# Patient Record
Sex: Female | Born: 1970 | Race: Black or African American | Hispanic: No | Marital: Single | Smoking: Former smoker
Health system: Southern US, Community
[De-identification: ages and names within clinical notes are randomized; demographics above are authoritative.]

## PROBLEM LIST (undated history)

## (undated) ENCOUNTER — Emergency Department (HOSPITAL_COMMUNITY): Admission: EM | Payer: Medicaid Other | Source: Home / Self Care

## (undated) DIAGNOSIS — I10 Essential (primary) hypertension: Secondary | ICD-10-CM

## (undated) DIAGNOSIS — R7989 Other specified abnormal findings of blood chemistry: Secondary | ICD-10-CM

## (undated) DIAGNOSIS — F200 Paranoid schizophrenia: Secondary | ICD-10-CM

## (undated) DIAGNOSIS — F329 Major depressive disorder, single episode, unspecified: Secondary | ICD-10-CM

## (undated) DIAGNOSIS — Z720 Tobacco use: Secondary | ICD-10-CM

## (undated) DIAGNOSIS — F32A Depression, unspecified: Secondary | ICD-10-CM

## (undated) DIAGNOSIS — R778 Other specified abnormalities of plasma proteins: Secondary | ICD-10-CM

## (undated) DIAGNOSIS — N3010 Interstitial cystitis (chronic) without hematuria: Secondary | ICD-10-CM

## (undated) DIAGNOSIS — D509 Iron deficiency anemia, unspecified: Secondary | ICD-10-CM

## (undated) DIAGNOSIS — F410 Panic disorder [episodic paroxysmal anxiety] without agoraphobia: Secondary | ICD-10-CM

## (undated) DIAGNOSIS — F419 Anxiety disorder, unspecified: Secondary | ICD-10-CM

## (undated) DIAGNOSIS — F319 Bipolar disorder, unspecified: Secondary | ICD-10-CM

## (undated) HISTORY — DX: Essential (primary) hypertension: I10

## (undated) HISTORY — PX: TUBAL LIGATION: SHX77

## (undated) HISTORY — DX: Bipolar disorder, unspecified: F31.9

---

## 1997-12-31 ENCOUNTER — Inpatient Hospital Stay (HOSPITAL_COMMUNITY): Admission: AD | Admit: 1997-12-31 | Discharge: 1997-12-31 | Payer: Self-pay | Admitting: *Deleted

## 1998-01-17 ENCOUNTER — Inpatient Hospital Stay (HOSPITAL_COMMUNITY): Admission: AD | Admit: 1998-01-17 | Discharge: 1998-01-17 | Payer: Self-pay | Admitting: *Deleted

## 1998-06-17 ENCOUNTER — Inpatient Hospital Stay (HOSPITAL_COMMUNITY): Admission: AD | Admit: 1998-06-17 | Discharge: 1998-06-17 | Payer: Self-pay | Admitting: Obstetrics & Gynecology

## 1998-07-31 ENCOUNTER — Inpatient Hospital Stay (HOSPITAL_COMMUNITY): Admission: AD | Admit: 1998-07-31 | Discharge: 1998-07-31 | Payer: Self-pay | Admitting: *Deleted

## 1998-10-30 ENCOUNTER — Emergency Department (HOSPITAL_COMMUNITY): Admission: EM | Admit: 1998-10-30 | Discharge: 1998-10-30 | Payer: Self-pay | Admitting: Emergency Medicine

## 1998-11-04 ENCOUNTER — Emergency Department (HOSPITAL_COMMUNITY): Admission: EM | Admit: 1998-11-04 | Discharge: 1998-11-04 | Payer: Self-pay | Admitting: Emergency Medicine

## 1998-12-19 ENCOUNTER — Emergency Department (HOSPITAL_COMMUNITY): Admission: EM | Admit: 1998-12-19 | Discharge: 1998-12-19 | Payer: Self-pay | Admitting: Emergency Medicine

## 1999-01-30 ENCOUNTER — Inpatient Hospital Stay (HOSPITAL_COMMUNITY): Admission: AD | Admit: 1999-01-30 | Discharge: 1999-01-30 | Payer: Self-pay | Admitting: Obstetrics and Gynecology

## 1999-03-10 ENCOUNTER — Other Ambulatory Visit: Admission: RE | Admit: 1999-03-10 | Discharge: 1999-03-10 | Payer: Self-pay | Admitting: Gastroenterology

## 1999-07-28 ENCOUNTER — Inpatient Hospital Stay (HOSPITAL_COMMUNITY): Admission: AD | Admit: 1999-07-28 | Discharge: 1999-07-28 | Payer: Self-pay | Admitting: Obstetrics & Gynecology

## 1999-08-11 ENCOUNTER — Inpatient Hospital Stay (HOSPITAL_COMMUNITY): Admission: AD | Admit: 1999-08-11 | Discharge: 1999-08-11 | Payer: Self-pay | Admitting: Obstetrics & Gynecology

## 1999-08-23 ENCOUNTER — Emergency Department (HOSPITAL_COMMUNITY): Admission: EM | Admit: 1999-08-23 | Discharge: 1999-08-23 | Payer: Self-pay | Admitting: Emergency Medicine

## 1999-09-02 ENCOUNTER — Emergency Department (HOSPITAL_COMMUNITY): Admission: EM | Admit: 1999-09-02 | Discharge: 1999-09-02 | Payer: Self-pay | Admitting: Internal Medicine

## 1999-12-21 ENCOUNTER — Emergency Department (HOSPITAL_COMMUNITY): Admission: EM | Admit: 1999-12-21 | Discharge: 1999-12-21 | Payer: Self-pay | Admitting: Emergency Medicine

## 2000-02-08 ENCOUNTER — Inpatient Hospital Stay (HOSPITAL_COMMUNITY): Admission: EM | Admit: 2000-02-08 | Discharge: 2000-02-08 | Payer: Self-pay | Admitting: Obstetrics

## 2000-08-24 ENCOUNTER — Inpatient Hospital Stay (HOSPITAL_COMMUNITY): Admission: AD | Admit: 2000-08-24 | Discharge: 2000-08-24 | Payer: Self-pay | Admitting: Obstetrics & Gynecology

## 2000-10-29 ENCOUNTER — Other Ambulatory Visit: Admission: RE | Admit: 2000-10-29 | Discharge: 2000-10-29 | Payer: Self-pay | Admitting: Obstetrics and Gynecology

## 2000-11-26 ENCOUNTER — Inpatient Hospital Stay (HOSPITAL_COMMUNITY): Admission: AD | Admit: 2000-11-26 | Discharge: 2000-11-26 | Payer: Self-pay | Admitting: Obstetrics and Gynecology

## 2001-05-30 ENCOUNTER — Inpatient Hospital Stay (HOSPITAL_COMMUNITY): Admission: AD | Admit: 2001-05-30 | Discharge: 2001-06-01 | Payer: Self-pay | Admitting: Obstetrics and Gynecology

## 2001-05-30 ENCOUNTER — Encounter (INDEPENDENT_AMBULATORY_CARE_PROVIDER_SITE_OTHER): Payer: Self-pay | Admitting: Specialist

## 2001-09-03 ENCOUNTER — Emergency Department (HOSPITAL_COMMUNITY): Admission: EM | Admit: 2001-09-03 | Discharge: 2001-09-03 | Payer: Self-pay | Admitting: Emergency Medicine

## 2001-09-06 ENCOUNTER — Emergency Department (HOSPITAL_COMMUNITY): Admission: EM | Admit: 2001-09-06 | Discharge: 2001-09-06 | Payer: Self-pay | Admitting: Emergency Medicine

## 2002-04-29 ENCOUNTER — Other Ambulatory Visit: Admission: RE | Admit: 2002-04-29 | Discharge: 2002-04-29 | Payer: Self-pay | Admitting: Obstetrics and Gynecology

## 2002-06-21 ENCOUNTER — Inpatient Hospital Stay (HOSPITAL_COMMUNITY): Admission: AD | Admit: 2002-06-21 | Discharge: 2002-06-21 | Payer: Self-pay | Admitting: Obstetrics and Gynecology

## 2002-09-07 ENCOUNTER — Inpatient Hospital Stay (HOSPITAL_COMMUNITY): Admission: AD | Admit: 2002-09-07 | Discharge: 2002-09-07 | Payer: Self-pay | Admitting: Obstetrics and Gynecology

## 2003-12-23 ENCOUNTER — Other Ambulatory Visit: Admission: RE | Admit: 2003-12-23 | Discharge: 2003-12-23 | Payer: Self-pay | Admitting: Obstetrics and Gynecology

## 2004-02-18 ENCOUNTER — Inpatient Hospital Stay (HOSPITAL_COMMUNITY): Admission: AD | Admit: 2004-02-18 | Discharge: 2004-02-18 | Payer: Self-pay | Admitting: Obstetrics & Gynecology

## 2004-05-31 ENCOUNTER — Emergency Department (HOSPITAL_COMMUNITY): Admission: EM | Admit: 2004-05-31 | Discharge: 2004-05-31 | Payer: Self-pay | Admitting: Emergency Medicine

## 2004-07-03 ENCOUNTER — Inpatient Hospital Stay (HOSPITAL_COMMUNITY): Admission: AD | Admit: 2004-07-03 | Discharge: 2004-07-03 | Payer: Self-pay | Admitting: Obstetrics and Gynecology

## 2004-10-21 ENCOUNTER — Emergency Department (HOSPITAL_COMMUNITY): Admission: EM | Admit: 2004-10-21 | Discharge: 2004-10-21 | Payer: Self-pay | Admitting: Emergency Medicine

## 2004-11-28 ENCOUNTER — Encounter: Admission: RE | Admit: 2004-11-28 | Discharge: 2004-11-28 | Payer: Self-pay | Admitting: Obstetrics and Gynecology

## 2005-04-23 ENCOUNTER — Emergency Department (HOSPITAL_COMMUNITY): Admission: EM | Admit: 2005-04-23 | Discharge: 2005-04-23 | Payer: Self-pay | Admitting: Emergency Medicine

## 2005-09-23 ENCOUNTER — Inpatient Hospital Stay (HOSPITAL_COMMUNITY): Admission: AD | Admit: 2005-09-23 | Discharge: 2005-09-23 | Payer: Self-pay | Admitting: Obstetrics & Gynecology

## 2006-03-25 ENCOUNTER — Inpatient Hospital Stay (HOSPITAL_COMMUNITY): Admission: AD | Admit: 2006-03-25 | Discharge: 2006-03-25 | Payer: Self-pay | Admitting: Obstetrics & Gynecology

## 2006-07-01 ENCOUNTER — Emergency Department (HOSPITAL_COMMUNITY): Admission: EM | Admit: 2006-07-01 | Discharge: 2006-07-01 | Payer: Self-pay | Admitting: Emergency Medicine

## 2006-09-19 ENCOUNTER — Emergency Department (HOSPITAL_COMMUNITY): Admission: EM | Admit: 2006-09-19 | Discharge: 2006-09-19 | Payer: Self-pay | Admitting: Emergency Medicine

## 2006-09-22 ENCOUNTER — Emergency Department (HOSPITAL_COMMUNITY): Admission: EM | Admit: 2006-09-22 | Discharge: 2006-09-22 | Payer: Self-pay | Admitting: Emergency Medicine

## 2007-01-19 ENCOUNTER — Emergency Department (HOSPITAL_COMMUNITY): Admission: EM | Admit: 2007-01-19 | Discharge: 2007-01-19 | Payer: Self-pay | Admitting: Emergency Medicine

## 2007-05-02 ENCOUNTER — Emergency Department (HOSPITAL_COMMUNITY): Admission: EM | Admit: 2007-05-02 | Discharge: 2007-05-02 | Payer: Self-pay | Admitting: Emergency Medicine

## 2007-11-16 ENCOUNTER — Emergency Department (HOSPITAL_COMMUNITY): Admission: EM | Admit: 2007-11-16 | Discharge: 2007-11-16 | Payer: Self-pay | Admitting: Emergency Medicine

## 2008-04-30 ENCOUNTER — Emergency Department (HOSPITAL_COMMUNITY): Admission: EM | Admit: 2008-04-30 | Discharge: 2008-04-30 | Payer: Self-pay | Admitting: *Deleted

## 2008-06-07 ENCOUNTER — Emergency Department (HOSPITAL_COMMUNITY): Admission: EM | Admit: 2008-06-07 | Discharge: 2008-06-07 | Payer: Self-pay | Admitting: Emergency Medicine

## 2009-06-11 ENCOUNTER — Emergency Department (HOSPITAL_COMMUNITY): Admission: EM | Admit: 2009-06-11 | Discharge: 2009-06-11 | Payer: Self-pay | Admitting: Emergency Medicine

## 2010-03-30 ENCOUNTER — Encounter: Admission: RE | Admit: 2010-03-30 | Discharge: 2010-03-30 | Payer: Self-pay | Admitting: General Surgery

## 2010-06-02 ENCOUNTER — Inpatient Hospital Stay (HOSPITAL_COMMUNITY): Admission: AD | Admit: 2010-06-02 | Discharge: 2010-06-03 | Payer: Self-pay | Admitting: Obstetrics and Gynecology

## 2011-02-17 NOTE — Discharge Summary (Signed)
[facility]  Patient:    Jody [name], Jody [name] Visit Number: [id number] MRN: [id number]          Service Type: OBS Location: 910A 9139 01 Attending Physician:  Osborn Coho Dictated by:   Caralyn Guile Arlyce Dice, M.D. Admit Date:  05/30/2001 Discharge Date: 06/01/2001                             Discharge Summary  FINAL DIAGNOSES:              1. Uterine pregnancy, delivered.                               2. Term birth, living child.                               3. Voluntary sterilization.  PROCEDURES:                   Vaginal delivery and postpartum bilateral tubal                               ligation.  COMPLICATIONS:                None.  CONDITION ON DISCHARGE:       Improved.  HOSPITAL COURSE:              This is a 40 year old gravida 6, para 4, AB 1, who was admitted for induction secondary to a borderline nonstress test.  The patients induction went smoothly and she was delivered by [employer] E. Dareen Piano, M.D., of a 6 pound 2 ounce female infant, Apgar scores 9 and 9.  The patient requested permanent sterilization. The risk of failure of 2:1000, the fact that the procedure was permanent, and alternative forms of birth control were all discussed with the patient prior to proceeding.  The patient was taken to the operating room by Richard D. Arlyce Dice, M.D., where a postpartum tubal ligation was performed.  In addition, a small umbilical hernia was repaired. The patients postpartum and postoperative course were benign. On the second postpartum day and the first postoperative day, the patient was felt to be ready for discharge.  DISCHARGE DIET:               Regular.  ACTIVITY:                     She was told to limit her activities.  DISCHARGE MEDICATIONS:        1. She was given Tylox 20 tablets to take one                                  to two every four hours for pain.                               2. Prenatal vitamins to  take.  FOLLOW-UP:                    Return to the office in four weeks for follow-up evaluation.  LABORATORY DATA:  Hemoglobin on admission was 12.5 and white count was 6400.  Postpartum it was 11.4 with white count of 9700.  She had a biologically positive RPR with a nonreactive TPPA.  Pathology report confirmed the presence of segments of the right and left fallopian tubes. Dictated by:   Caralyn Guile Arlyce Dice, M.D. Attending Physician:  Osborn Coho DD:  06/25/01 TD:  06/25/01 Job: [phone] [id number]

## 2011-02-17 NOTE — Op Note (Signed)
[facility]  Patient:    Jody [name], Jody [name] Visit Number: [id number] MRN: [id number]          Service Type: OBS Location: 910A 9139 01 Attending Physician:  Osborn Coho Dictated by:   Caralyn Guile Arlyce Dice, M.D. Proc. Date: 05/31/01 Admit Date:  05/30/2001                             Operative Report  PREOPERATIVE DIAGNOSES:       1. Voluntary sterilization.                               2. Small umbilical hernia.  POSTOPERATIVE DIAGNOSES:      1. Voluntary sterilization.                               2. Small umbilical hernia.  PROCEDURES:                   1. Bilateral partial salpingectomy for                                  sterilization.                               2. Repair of umbilical hernia.  SURGEON:                      Richard D. Arlyce Dice, M.D.  ANESTHESIA:                   General endotracheal.  ESTIMATED BLOOD LOSS:         10 cc.  FINDINGS:                     Normal-appearing fallopian tubes.  Small subumbilical fascial defect.  INDICATIONS:                  This is a 40 year old gravida 4, para 4 who is status post spontaneous vaginal delivery on 05/30/01.  During her pregnancy, permanent sterilization was requested by the patient.  The risks and benefits of this procedure were gone over prior to proceeding.  In addition, the patient has a small umbilical hernia, which was worse during pregnancy, and she was told an attempt would be made to repair this problem at the time of postpartum tubal.  DESCRIPTION OF PROCEDURE:     The patient was taken to the operating room and placed under general endotracheal anesthesia.  The abdomen was prepped and draped in a sterile fashion.  A 3 cm subumbilical incision was made and carried down to the fascia.  The peritoneum was entered sharply.  The right fallopian tube was identified, grasped with a Babcock clamp and traced to its fimbriated end.  The isthmic-ampullary junction  was then isolated and doubly ligated with plain catgut suture.  A segment of tube was removed for pathologic confirmation.  An identical procedure was then carried out on the left fallopian tube.  During closure, the defect was palpated and was felt to be very small.  Using Vicryl suture, wide bites through the fascia and peritoneum were taken.  The defect was closed along with  the fascia in an interrupted fashion.  The skin was then closed with a subcuticular 4-0 Dexon suture.  The patient tolerated the procedure well and left the operating room in good condition. Dictated by:   Caralyn Guile Arlyce Dice, M.D. Attending Physician:  Osborn Coho DD:  05/31/01 TD:  05/31/01 Job: [phone] [id number]

## 2011-07-05 LAB — CBC
HCT: 38.7
Platelets: 257
WBC: 3.6 — ABNORMAL LOW

## 2011-07-05 LAB — URINE MICROSCOPIC-ADD ON

## 2011-07-05 LAB — COMPREHENSIVE METABOLIC PANEL
ALT: 14
AST: 14
CO2: 29
Chloride: 110
GFR calc Af Amer: >60
GFR calc non Af Amer: >60
Potassium: 3.8
Sodium: 142
Total Bilirubin: 0.7

## 2011-07-05 LAB — URINALYSIS, ROUTINE W REFLEX MICROSCOPIC
Bilirubin Urine: NEGATIVE
Glucose, UA: NEGATIVE
Protein, ur: NEGATIVE

## 2011-07-05 LAB — DIFFERENTIAL
Lymphocytes Relative: 42
Lymphs Abs: 1.5
Neutrophils Relative %: 48

## 2012-06-09 ENCOUNTER — Encounter (HOSPITAL_COMMUNITY): Payer: Self-pay | Admitting: Emergency Medicine

## 2012-06-09 ENCOUNTER — Emergency Department (HOSPITAL_COMMUNITY)
Admission: EM | Admit: 2012-06-09 | Discharge: 2012-06-09 | Disposition: A | Discharge status: Home / Self Care | Payer: PRIVATE HEALTH INSURANCE | Attending: Emergency Medicine | Admitting: Emergency Medicine

## 2012-06-09 DIAGNOSIS — S025XXA Fracture of tooth (traumatic), initial encounter for closed fracture: Secondary | ICD-10-CM | POA: Insufficient documentation

## 2012-06-09 DIAGNOSIS — K0889 Other specified disorders of teeth and supporting structures: Secondary | ICD-10-CM

## 2012-06-09 DIAGNOSIS — X58XXXA Exposure to other specified factors, initial encounter: Secondary | ICD-10-CM | POA: Insufficient documentation

## 2012-06-09 DIAGNOSIS — F17200 Nicotine dependence, unspecified, uncomplicated: Secondary | ICD-10-CM | POA: Insufficient documentation

## 2012-06-09 DIAGNOSIS — K219 Gastro-esophageal reflux disease without esophagitis: Secondary | ICD-10-CM | POA: Insufficient documentation

## 2012-06-09 MED ORDER — RANITIDINE HCL 150 MG PO CAPS: 150.0000 mg | ORAL_CAPSULE | Freq: Two times a day (BID) | ORAL | Status: DC

## 2012-06-09 MED ORDER — IBUPROFEN 800 MG PO TABS: 800.0000 mg | ORAL_TABLET | Freq: Three times a day (TID) | ORAL | Status: AC

## 2012-06-09 MED ORDER — GI COCKTAIL ~~LOC~~
30.0000 mL | Freq: Once | ORAL | Status: AC
  Administered 2012-06-09: 30 mL via ORAL
  Filled 2012-06-09: qty 30

## 2012-06-09 MED ORDER — CEPHALEXIN 250 MG PO CAPS: 250.0000 mg | ORAL_CAPSULE | Freq: Four times a day (QID) | ORAL | Status: AC

## 2012-06-09 NOTE — ED Notes (Signed)
Patient presents to ed c/o toothache left lower and right upper for several days. States she also has gastric reflux causing some burning in her throat.

## 2012-06-09 NOTE — ED Provider Notes (Signed)
History     CSN: [id number]  Arrival date & time 06/09/12  8469   First MD Initiated Contact with Patient 06/09/12 [phone]      Chief Complaint  Patient presents with  . Dental Pain    (Consider location/radiation/quality/duration/timing/severity/associated sxs/prior treatment) HPI Hx from pt. RAKIYA KRAWCZYK is a 41 y.o. female presents with complaint of dental pain as well as burning in her throat. She states that the dental pain has been intermittent in nature for the past several months. It is located to the left side of her mouth and both her lower and upper teeth. It has gotten markedly worse over the past several days. Pain is described as throbbing without known aggravating or alleviating factors. She denies fevers, chills, facial swelling, difficulty opening her mouth, swelling or pain to her neck. No medications prior to coming here. She does not currently have a dentist.  She also complains of a sensation of burning in her throat. She states that she has been told in the past that she has acid reflux and previously was taking Nexium for this. States that she is currently unable to afford her prescription for this. States that her current symptoms do feel consistent with her previous reflux symptoms. She describes a sensation of burning in her throat and occasional pain with swallowing. She denies any difficulty breathing with this. She denies any chest pain or shortness of breath. Pain has been ongoing for the past several months while she has not been taking her medications.  Past Medical History  Diagnosis Date  . IC (intermittent claudication)     No past surgical history on file.  No family history on file.  History  Substance Use Topics  . Smoking status: Current Everyday Smoker  . Smokeless tobacco: Not on file  . Alcohol Use: No    OB History    Grav Para Term Preterm Abortions TAB SAB Ect Mult Living                  Review of Systems as per history of  present illness  Allergies  Penicillins  Home Medications   Current Outpatient Rx  Name Route Sig Dispense Refill  . AMITRIPTYLINE HCL 25 MG PO TABS Oral Take 25 mg by mouth daily.    Marland Kitchen SOLIFENACIN SUCCINATE 5 MG PO TABS Oral Take 5 mg by mouth daily.    . CEPHALEXIN 250 MG PO CAPS Oral Take 1 capsule (250 mg total) by mouth 4 (four) times daily. 28 capsule 0  . IBUPROFEN 800 MG PO TABS Oral Take 1 tablet (800 mg total) by mouth 3 (three) times daily. 21 tablet 0  . RANITIDINE HCL 150 MG PO CAPS Oral Take 1 capsule (150 mg total) by mouth 2 (two) times daily. 60 capsule 3    BP 122/78  Pulse 72  Temp 98.3 F (36.8 C) (Oral)  Resp 20  SpO2 100%  LMP 05/19/2012  Physical Exam  Nursing note and vitals reviewed. Constitutional: She appears well-developed and well-nourished. No distress.  HENT:  Head: Normocephalic and atraumatic.  Mouth/Throat: Oropharynx is clear and moist. No oropharyngeal exudate.         Teeth as diagrammed above. There is no tenderness to the floor of the mouth. She has no trismus or malocclusion. There is no facial swelling noted. No obvious evidence of dental abscess although she is tender to palpation.  Neck: Normal range of motion. Neck supple.       Shotty  minor lymphadenopathy  Cardiovascular: Normal rate, regular rhythm and normal heart sounds.   Pulmonary/Chest: Effort normal and breath sounds normal. She exhibits no tenderness.  Musculoskeletal: Normal range of motion.  Neurological: She is alert.  Skin: Skin is warm and dry. She is not diaphoretic.  Psychiatric: She has a normal mood and affect.    ED Course  Procedures (including critical care time)  Labs Reviewed - No data to display No results found.   1. Pain, dental   2. Reflux       MDM  Patient presents with dental pain. She is noted to have broken teeth as diagrammed above. There is no obvious evidence of dental abscess although the patient does have considerable tenderness  to palpation on exam. We'll plan to start her on antibiotics. She was advised that her antibiotic should be on the free antibiotic list at [facility]. She is advised to take ibuprofen as needed for pain. She was given a prescription for Zantac for her reflux symptoms as well as a GI cocktail here. The Zantac should be on the $4 [facility] list and she was made aware of this. She was given the phone number for the dentist on call as well as local dental resources. Reasons to return discussed. She verbalized understanding and agreed to plan.       Grant Fontana, PA-C 06/09/12 1014

## 2012-06-09 NOTE — ED Notes (Signed)
Pt. Stated, I've had toothache for 2 weeks.

## 2012-06-13 NOTE — ED Provider Notes (Signed)
Medical screening examination/treatment/procedure(s) were performed by non-physician practitioner and as supervising physician I was immediately available for consultation/collaboration.  Hurman Horn, MD 06/13/12 [phone]

## 2012-11-28 ENCOUNTER — Encounter: Payer: Self-pay | Admitting: Internal Medicine

## 2012-12-18 ENCOUNTER — Encounter: Payer: Self-pay | Admitting: Internal Medicine

## 2012-12-24 ENCOUNTER — Ambulatory Visit: Payer: PRIVATE HEALTH INSURANCE | Admitting: Internal Medicine

## 2013-01-17 ENCOUNTER — Encounter (HOSPITAL_COMMUNITY): Payer: Self-pay | Admitting: Emergency Medicine

## 2013-01-17 ENCOUNTER — Emergency Department (INDEPENDENT_AMBULATORY_CARE_PROVIDER_SITE_OTHER)
Admission: EM | Admit: 2013-01-17 | Discharge: 2013-01-17 | Disposition: A | Discharge status: Home / Self Care | Payer: PRIVATE HEALTH INSURANCE | Source: Home / Self Care | Attending: Family Medicine | Admitting: Family Medicine

## 2013-01-17 DIAGNOSIS — R300 Dysuria: Secondary | ICD-10-CM

## 2013-01-17 LAB — POCT URINALYSIS DIP (DEVICE)
Ketones, ur: NEGATIVE mg/dL
Nitrite: NEGATIVE
Protein, ur: NEGATIVE mg/dL
pH: 7 (ref 5.0–8.0)

## 2013-01-17 MED ORDER — SOLIFENACIN SUCCINATE 5 MG PO TABS: 5.0000 mg | ORAL_TABLET | Freq: Every day | ORAL | Status: DC

## 2013-01-17 MED ORDER — TRAMADOL HCL 50 MG PO TABS: 50.0000 mg | ORAL_TABLET | Freq: Four times a day (QID) | ORAL | Status: DC | PRN

## 2013-01-17 MED ORDER — AZITHROMYCIN 250 MG PO TABS
ORAL_TABLET | ORAL | Status: AC
  Filled 2013-01-17: qty 4

## 2013-01-17 MED ORDER — CEFTRIAXONE SODIUM 1 G IJ SOLR
INTRAMUSCULAR | Status: AC
  Filled 2013-01-17: qty 10

## 2013-01-17 MED ORDER — LIDOCAINE HCL (PF) 1 % IJ SOLN
INTRAMUSCULAR | Status: AC
  Filled 2013-01-17: qty 5

## 2013-01-17 MED ORDER — AMITRIPTYLINE HCL 25 MG PO TABS: 25.0000 mg | ORAL_TABLET | Freq: Every evening | ORAL | Status: DC | PRN

## 2013-01-17 NOTE — ED Provider Notes (Signed)
History     CSN: [id number]  Arrival date & time 01/17/13  1059   First MD Initiated Contact with Patient 01/17/13 1136      Chief Complaint  Patient presents with  . Cystitis    (Consider location/radiation/quality/duration/timing/severity/associated sxs/prior treatment) HPI Comments: 42 year old female with history of interstitial cystitis diagnosed 3 years ago. Has been followed by a urologist and had a medical regimen that was working for her symptoms but has been off her medications for about 8 months due to to lack of refills and she lost track of her urologist. Here complaining of suprapubic tenderness, sometimes painful urination, nocturia about 4-5 times, fullness sensation of her bladder. Denies hematuria or abdominal cramping. No flank pain. No nausea or vomiting. No vaginal discharge. Patient states symptoms have been similar for months with periods of exacerbations this last time for about a week. Denies fever or chills.   Past Medical History  Diagnosis Date  . IC (intermittent claudication)     Past Surgical History  Procedure Laterality Date  . Tubal ligation      No family history on file.  History  Substance Use Topics  . Smoking status: Current Every Day Smoker -- 0.25 packs/day    Types: Cigarettes  . Smokeless tobacco: Not on file  . Alcohol Use: Yes     Comment: occasionally    OB History   Grav Para Term Preterm Abortions TAB SAB Ect Mult Living                  Review of Systems  Constitutional: Negative for fever and chills.  Gastrointestinal: Positive for abdominal pain. Negative for nausea and vomiting.  Genitourinary: Positive for dysuria and frequency. Negative for flank pain.  Musculoskeletal: Negative for arthralgias.  Skin: Negative for rash.  Neurological: Negative for dizziness and headaches.  All other systems reviewed and are negative.    Allergies  Penicillins  Home Medications   Current Outpatient Rx  Name  Route  Sig   Dispense  Refill  . amitriptyline (ELAVIL) 25 MG tablet   Oral   Take 1 tablet (25 mg total) by mouth at bedtime as needed for sleep.   30 tablet   0   . ranitidine (ZANTAC) 150 MG capsule   Oral   Take 1 capsule (150 mg total) by mouth 2 (two) times daily.   60 capsule   3   . solifenacin (VESICARE) 5 MG tablet   Oral   Take 1 tablet (5 mg total) by mouth daily.   30 tablet   0   . traMADol (ULTRAM) 50 MG tablet   Oral   Take 1 tablet (50 mg total) by mouth every 6 (six) hours as needed for pain.   20 tablet   0     BP 127/87  Pulse 77  Temp(Src) 98.5 F (36.9 C) (Oral)  SpO2 99%  LMP 01/06/2013  Physical Exam  Nursing note and vitals reviewed. Constitutional: She is oriented to person, place, and time. She appears well-developed and well-nourished. No distress.  HENT:  Head: Normocephalic and atraumatic.  Cardiovascular: Normal heart sounds.   Pulmonary/Chest: Breath sounds normal.  Abdominal: Soft. Bowel sounds are normal. She exhibits no distension and no mass. There is no rebound and no guarding.  Suprapubic tenderness with deep palpation. No costovertebral tenderness bilaterally.  Neurological: She is alert and oriented to person, place, and time.  Skin: No rash noted. She is not diaphoretic.    ED Course  Procedures (including critical care time)  Labs Reviewed  POCT URINALYSIS DIP (DEVICE) - Abnormal; Notable for the following:    Leukocytes, UA TRACE (*)    All other components within normal limits  URINE CULTURE   No results found.   1. Dysuria       MDM  Refilled amitriptyline, VESIcare and also prescribed tramadol. Minimal findings in point-of-care urine. Patient without fever or chills. My impression is that symptoms are related to interstitial cystitis unlikely she has a urinary infection. Urine was sent for culture. No antibiotics were prescribed today. Recommended smoking cessation. Supportive care and red flags that should prompt her  return to medical attention discussed with patient and provided in writing.         Sharin Grave, MD 01/17/13 1725

## 2013-01-17 NOTE — ED Notes (Signed)
Pt c/o bladder infection x 1 week. Was diagnosed with IC bladder about 3 years ago, normally sees urology but could not be seen today. Has frequent urination, with nocturia 4-5 times a night. Urine is foul smelling and bladder feels full. No hematuria, fever, or abdominal cramping. Previously used Information systems manager with relief of symptoms but has not used it in 8 months due to cost. Patient is alert and orineted.

## 2013-01-19 LAB — URINE CULTURE: Colony Count: 25000

## 2013-01-21 NOTE — ED Notes (Signed)
No abx's required. ----- Message ----- From: Vassie Moselle, RN Sent: 01/20/2013 10:17 PM To: Sharin Grave, MD Urine culture: 25,000 colonies E. Coli. I did not see an antibiotic ordered. Vassie Moselle 01/20/2013  Message from Dr. Tressia Danas cut and pasted from my in-basket. Vassie Moselle 01/21/2013

## 2013-06-01 ENCOUNTER — Emergency Department (HOSPITAL_COMMUNITY)
Admission: EM | Admit: 2013-06-01 | Discharge: 2013-06-01 | Disposition: A | Discharge status: Home / Self Care | Payer: PRIVATE HEALTH INSURANCE | Attending: Emergency Medicine | Admitting: Emergency Medicine

## 2013-06-01 ENCOUNTER — Encounter (HOSPITAL_COMMUNITY): Payer: Self-pay | Admitting: *Deleted

## 2013-06-01 DIAGNOSIS — Z880 Allergy status to penicillin: Secondary | ICD-10-CM | POA: Insufficient documentation

## 2013-06-01 DIAGNOSIS — R1013 Epigastric pain: Secondary | ICD-10-CM | POA: Insufficient documentation

## 2013-06-01 DIAGNOSIS — Z3202 Encounter for pregnancy test, result negative: Secondary | ICD-10-CM | POA: Insufficient documentation

## 2013-06-01 DIAGNOSIS — Z87448 Personal history of other diseases of urinary system: Secondary | ICD-10-CM | POA: Insufficient documentation

## 2013-06-01 DIAGNOSIS — Z9851 Tubal ligation status: Secondary | ICD-10-CM | POA: Insufficient documentation

## 2013-06-01 DIAGNOSIS — R110 Nausea: Secondary | ICD-10-CM | POA: Insufficient documentation

## 2013-06-01 DIAGNOSIS — F17200 Nicotine dependence, unspecified, uncomplicated: Secondary | ICD-10-CM | POA: Insufficient documentation

## 2013-06-01 DIAGNOSIS — K921 Melena: Secondary | ICD-10-CM | POA: Insufficient documentation

## 2013-06-01 DIAGNOSIS — R300 Dysuria: Secondary | ICD-10-CM | POA: Insufficient documentation

## 2013-06-01 DIAGNOSIS — K2970 Gastritis, unspecified, without bleeding: Secondary | ICD-10-CM | POA: Insufficient documentation

## 2013-06-01 DIAGNOSIS — R197 Diarrhea, unspecified: Secondary | ICD-10-CM | POA: Insufficient documentation

## 2013-06-01 DIAGNOSIS — Z79899 Other long term (current) drug therapy: Secondary | ICD-10-CM | POA: Insufficient documentation

## 2013-06-01 HISTORY — DX: Interstitial cystitis (chronic) without hematuria: N30.10

## 2013-06-01 LAB — URINE MICROSCOPIC-ADD ON

## 2013-06-01 LAB — URINALYSIS, ROUTINE W REFLEX MICROSCOPIC
Glucose, UA: NEGATIVE mg/dL
Ketones, ur: NEGATIVE mg/dL
Nitrite: NEGATIVE
Specific Gravity, Urine: 1.021 (ref 1.005–1.030)
pH: 6 (ref 5.0–8.0)

## 2013-06-01 LAB — COMPREHENSIVE METABOLIC PANEL
AST: 13 U/L (ref 0–37)
Albumin: 3.9 g/dL (ref 3.5–5.2)
Alkaline Phosphatase: 51 U/L (ref 39–117)
Chloride: 104 mEq/L (ref 96–112)
Creatinine, Ser: 0.82 mg/dL (ref 0.50–1.10)
Potassium: 3.9 mEq/L (ref 3.5–5.1)
Total Bilirubin: 0.5 mg/dL (ref 0.3–1.2)
Total Protein: 6.7 g/dL (ref 6.0–8.3)

## 2013-06-01 LAB — CBC WITH DIFFERENTIAL/PLATELET
Basophils Absolute: 0 10*3/uL (ref 0.0–0.1)
Basophils Relative: 0 % (ref 0–1)
Eosinophils Absolute: 0.1 10*3/uL (ref 0.0–0.7)
MCH: 23.8 pg — ABNORMAL LOW (ref 26.0–34.0)
MCHC: 32.4 g/dL (ref 30.0–36.0)
Neutro Abs: 4.2 10*3/uL (ref 1.7–7.7)
Neutrophils Relative %: 72 % (ref 43–77)
RDW: 14.1 % (ref 11.5–15.5)

## 2013-06-01 LAB — POCT PREGNANCY, URINE: Preg Test, Ur: NEGATIVE

## 2013-06-01 LAB — LIPASE, BLOOD: Lipase: 33 U/L (ref 11–59)

## 2013-06-01 MED ORDER — ONDANSETRON 4 MG PO TBDP: 4.0000 mg | ORAL_TABLET | Freq: Three times a day (TID) | ORAL | Status: DC | PRN

## 2013-06-01 MED ORDER — ONDANSETRON HCL 4 MG/2ML IJ SOLN
4.0000 mg | Freq: Once | INTRAMUSCULAR | Status: AC
  Administered 2013-06-01: 4 mg via INTRAVENOUS
  Filled 2013-06-01: qty 2

## 2013-06-01 MED ORDER — FAMOTIDINE 20 MG PO TABS
40.0000 mg | ORAL_TABLET | Freq: Once | ORAL | Status: AC
  Administered 2013-06-01: 40 mg via ORAL
  Filled 2013-06-01: qty 1

## 2013-06-01 MED ORDER — GI COCKTAIL ~~LOC~~
30.0000 mL | Freq: Once | ORAL | Status: AC
  Administered 2013-06-01: 30 mL via ORAL
  Filled 2013-06-01: qty 30

## 2013-06-01 MED ORDER — HYDROMORPHONE HCL PF 1 MG/ML IJ SOLN
1.0000 mg | Freq: Once | INTRAMUSCULAR | Status: AC
  Administered 2013-06-01: 1 mg via INTRAVENOUS
  Filled 2013-06-01: qty 1

## 2013-06-01 MED ORDER — TRAMADOL HCL 50 MG PO TABS: 50.0000 mg | ORAL_TABLET | Freq: Four times a day (QID) | ORAL | Status: DC | PRN

## 2013-06-01 MED ORDER — OMEPRAZOLE 20 MG PO CPDR: DELAYED_RELEASE_CAPSULE | ORAL | Status: DC

## 2013-06-01 MED ORDER — SODIUM CHLORIDE 0.9 % IV BOLUS (SEPSIS)
1000.0000 mL | Freq: Once | INTRAVENOUS | Status: AC
  Administered 2013-06-01: 1000 mL via INTRAVENOUS

## 2013-06-01 MED ORDER — LORAZEPAM 1 MG PO TABS
1.0000 mg | ORAL_TABLET | Freq: Once | ORAL | Status: AC
  Administered 2013-06-01: 1 mg via ORAL
  Filled 2013-06-01: qty 1

## 2013-06-01 NOTE — ED Provider Notes (Addendum)
CSN: [id number]     Arrival date & time 06/01/13  0757 History   First MD Initiated Contact with Patient 06/01/13 0813     Chief Complaint  Patient presents with  . Abdominal Pain   (Consider location/radiation/quality/duration/timing/severity/associated sxs/prior Treatment) HPI Comments: Patient with history of irritable bowel syndrome, interstitial cystitis, GERD -- presents with complaint of upper abdominal pain for the past one year, worse over the past 4 months. Pain is epigastric, sharp, burning. It does not radiate. It is associated with nausea, no vomiting. 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 This is not unusual for her. She has an occasional dysuria which is also not unusual. She denies fever. No history of abdominal surgeries. She admits to using frequent doses of NSAIDs for pain. She also uses Pepto-Bismol. Patient is a smoker. Onset of symptoms gradual. Course is constant. Nothing makes symptoms better or worse.  The history is provided by the patient.    Past Medical History  Diagnosis Date  . Cystitis, interstitial    Past Surgical History  Procedure Laterality Date  . Tubal ligation     Family History  Problem Relation Age of Onset  . Cancer Father    History  Substance Use Topics  . Smoking status: Current Every Day Smoker -- 0.25 packs/day    Types: Cigarettes  . Smokeless tobacco: Not on file  . Alcohol Use: Yes     Comment: occasionally   OB History   Grav Para Term Preterm Abortions TAB SAB Ect Mult Living                 Review of Systems  Constitutional: Negative for fever.  HENT: Negative for sore throat and rhinorrhea.   Eyes: Negative for redness.  Respiratory: Negative for cough.   Cardiovascular: Negative for chest pain.  Gastrointestinal: Positive for nausea, abdominal pain, diarrhea (baseline) and blood in stool (non-acute). Negative for vomiting.  Genitourinary:  Positive for dysuria (baseline). Negative for hematuria.  Musculoskeletal: Negative for myalgias.  Skin: Negative for rash.  Neurological: Negative for headaches.    Allergies  Penicillins  Home Medications   Current Outpatient Rx  Name  Route  Sig  Dispense  Refill  . amitriptyline (ELAVIL) 25 MG tablet   Oral   Take 1 tablet (25 mg total) by mouth at bedtime as needed for sleep.   30 tablet   0   . ranitidine (ZANTAC) 150 MG capsule   Oral   Take 1 capsule (150 mg total) by mouth 2 (two) times daily.   60 capsule   3   . traMADol (ULTRAM) 50 MG tablet   Oral   Take 1 tablet (50 mg total) by mouth every 6 (six) hours as needed for pain.   20 tablet   0    BP 126/76  Temp(Src) 99.3 F (37.4 C) (Oral)  Resp 22  Ht 5\' 4"  (1.626 m)  Wt 140 lb (63.504 kg)  BMI 24.02 kg/m2  SpO2 99%  LMP 05/16/2013 Physical Exam  Nursing note and vitals reviewed. Constitutional: She appears well-developed and well-nourished.  HENT:  Head: Normocephalic and atraumatic.  Eyes: Conjunctivae are normal. Right eye exhibits no discharge. Left eye exhibits no discharge.  Neck: Normal range of motion. Neck supple.  Cardiovascular: Normal rate, regular rhythm and normal heart sounds.   No murmur heard. Pulmonary/Chest: Effort normal and breath sounds normal. No respiratory distress. She has no wheezes. She has  no rales.  Abdominal: Soft. Bowel sounds are normal. There is tenderness (mild) in the epigastric area. There is no rigidity, no rebound, no guarding, no tenderness at McBurney's point and negative Murphy's sign.  Neurological: She is alert.  Skin: Skin is warm and dry.  Psychiatric: She has a normal mood and affect.    ED Course  Procedures (including critical care time) Labs Review Labs Reviewed  CBC WITH DIFFERENTIAL - Abnormal; Notable for the following:    RBC 5.34 (*)    MCV 73.4 (*)    MCH 23.8 (*)    All other components within normal limits  COMPREHENSIVE METABOLIC  PANEL - Abnormal; Notable for the following:    GFR calc non Af Amer 88 (*)    All other components within normal limits  URINALYSIS, ROUTINE W REFLEX MICROSCOPIC - Abnormal; Notable for the following:    Leukocytes, UA SMALL (*)    All other components within normal limits  URINE MICROSCOPIC-ADD ON - Abnormal; Notable for the following:    Squamous Epithelial / LPF FEW (*)    Bacteria, UA MANY (*)    All other components within normal limits  LIPASE, BLOOD  POCT PREGNANCY, URINE   Imaging Review No results found.  8:43 AM Patient seen and examined. Work-up initiated. Medications ordered.   Vital signs reviewed and are as follows: Filed Vitals:   06/01/13 0817  BP: 126/76  Temp: 99.3 F (37.4 C)  Resp: 22    Date: 06/01/2013  Rate: 102  Rhythm: normal sinus rhythm, tachycardia  QRS Axis: normal  Intervals: normal  ST/T Wave abnormalities: normal  Conduction Disutrbances:none  Narrative Interpretation:   Old EKG Reviewed: none available     Patient feels much better after treatment. I reviewed all results with patient.   Will start on PPI. Will give pain medication and nausea medication.   Patient counseled on use of narcotic pain medications. Counseled not to combine these medications with others containing tylenol. Urged not to drink alcohol, drive, or perform any other activities that requires focus while taking these medications. The patient verbalizes understanding and agrees with the plan.  Pt given GI and PCP referrals.   Pt counseled to d/c NSAIDs.   MDM   1. Epigastric pain   2. Gastritis    Patient with symptoms consistent with gastritis, 2/2 heavy NSAID use?  Vitals are stable, no fever.  Labs reassuring. No signs of dehydration, tolerating PO's.  Lungs are clear.  No focal abdominal pain, no concern for appendicitis, cholecystitis, pancreatitis, ruptured viscus, UTI, kidney stone, or any other abdominal etiology.  Supportive therapy indicated with  return if symptoms worsen.  Patient counseled.     Renne Crigler, PA-C 06/01/13 [address], [place] 06/01/13 [phone]

## 2013-06-01 NOTE — ED Notes (Signed)
Patient states she had upper abdominal pain for the past year and has a hx of irritable bowel syndrome.  Patient has not seen anyone for this condition.

## 2013-06-01 NOTE — ED Notes (Signed)
Patient requested medication for anxiety. Patient appears calm, is resting on the bed when I entered the room. Patient reports her mother is continuously asking questions related to patient treatment and progress today. Patient encouraged to report she is trying to rest at this time and that she will update mom when she has more information. Patient states "shes probable why I am so worked up [employer] PA Josh at bedside at this time.

## 2013-06-01 NOTE — ED Provider Notes (Signed)
Medical screening examination/treatment/procedure(s) were performed by non-physician practitioner and as supervising physician I was immediately available for consultation/collaboration.   Kassidee Narciso Y. Victory Strollo, MD 06/01/13 1524 

## 2013-06-03 ENCOUNTER — Encounter (HOSPITAL_COMMUNITY): Payer: Self-pay | Admitting: Cardiology

## 2013-06-03 ENCOUNTER — Emergency Department (HOSPITAL_COMMUNITY)
Admission: EM | Admit: 2013-06-03 | Discharge: 2013-06-04 | Disposition: A | Discharge status: Home / Self Care | Payer: Self-pay | Attending: Emergency Medicine | Admitting: Emergency Medicine

## 2013-06-03 DIAGNOSIS — Z79899 Other long term (current) drug therapy: Secondary | ICD-10-CM | POA: Insufficient documentation

## 2013-06-03 DIAGNOSIS — Z3202 Encounter for pregnancy test, result negative: Secondary | ICD-10-CM | POA: Insufficient documentation

## 2013-06-03 DIAGNOSIS — Z87448 Personal history of other diseases of urinary system: Secondary | ICD-10-CM | POA: Insufficient documentation

## 2013-06-03 DIAGNOSIS — R45851 Suicidal ideations: Secondary | ICD-10-CM | POA: Insufficient documentation

## 2013-06-03 DIAGNOSIS — F17200 Nicotine dependence, unspecified, uncomplicated: Secondary | ICD-10-CM | POA: Insufficient documentation

## 2013-06-03 LAB — COMPREHENSIVE METABOLIC PANEL
ALT: 10 U/L (ref 0–35)
AST: 12 U/L (ref 0–37)
Albumin: 3.8 g/dL (ref 3.5–5.2)
CO2: 25 mEq/L (ref 19–32)
Calcium: 9.3 mg/dL (ref 8.4–10.5)
Creatinine, Ser: 0.78 mg/dL (ref 0.50–1.10)
GFR calc non Af Amer: >90 mL/min (ref >90)
Sodium: 138 mEq/L (ref 135–145)
Total Protein: 6.6 g/dL (ref 6.0–8.3)

## 2013-06-03 LAB — CBC
MCH: 24.3 pg — ABNORMAL LOW (ref 26.0–34.0)
MCHC: 33.4 g/dL (ref 30.0–36.0)
MCV: 72.8 fL — ABNORMAL LOW (ref 78.0–100.0)
Platelets: 279 10*3/uL (ref 150–400)
RBC: 5.3 MIL/uL — ABNORMAL HIGH (ref 3.87–5.11)
RDW: 13.9 % (ref 11.5–15.5)

## 2013-06-03 LAB — SALICYLATE LEVEL: Salicylate Lvl: <2 mg/dL — ABNORMAL LOW (ref 2.8–20.0)

## 2013-06-03 LAB — RAPID URINE DRUG SCREEN, HOSP PERFORMED
Amphetamines: NOT DETECTED
Cocaine: NOT DETECTED
Opiates: NOT DETECTED
Tetrahydrocannabinol: NOT DETECTED

## 2013-06-03 MED ORDER — NICOTINE 21 MG/24HR TD PT24
21.0000 mg | MEDICATED_PATCH | Freq: Every day | TRANSDERMAL | Status: DC
  Administered 2013-06-03: 21 mg via TRANSDERMAL
  Filled 2013-06-03: qty 1

## 2013-06-03 MED ORDER — ACETAMINOPHEN 325 MG PO TABS: 650.0000 mg | ORAL_TABLET | ORAL | Status: DC | PRN

## 2013-06-03 MED ORDER — LORAZEPAM 1 MG PO TABS
1.0000 mg | ORAL_TABLET | Freq: Three times a day (TID) | ORAL | Status: DC | PRN
  Administered 2013-06-04: 1 mg via ORAL
  Filled 2013-06-03: qty 1

## 2013-06-03 MED ORDER — ONDANSETRON HCL 4 MG PO TABS: 4.0000 mg | ORAL_TABLET | Freq: Three times a day (TID) | ORAL | Status: DC | PRN

## 2013-06-03 MED ORDER — IBUPROFEN 200 MG PO TABS: 600.0000 mg | ORAL_TABLET | Freq: Three times a day (TID) | ORAL | Status: DC | PRN

## 2013-06-03 MED ORDER — ALUM & MAG HYDROXIDE-SIMETH 200-200-20 MG/5ML PO SUSP: 30.0000 mL | ORAL | Status: DC | PRN

## 2013-06-03 MED ORDER — ACETAMINOPHEN 325 MG PO TABS
650.0000 mg | ORAL_TABLET | Freq: Once | ORAL | Status: AC
  Administered 2013-06-03: 650 mg via ORAL
  Filled 2013-06-03: qty 2

## 2013-06-03 NOTE — ED Provider Notes (Signed)
CSN: [id number]     Arrival date & time 06/03/13  1604 History   First MD Initiated Contact with Patient 06/03/13 1628     Chief Complaint  Patient presents with  . Medical Clearance  . Suicidal   (Consider location/radiation/quality/duration/timing/severity/associated sxs/prior Treatment) The history is provided by the patient.   patient complaining of suicidal ideations with plan to slit her wrist. No prior history of suicide in the past. Has had increased stress at home which is also not to work and became more depressed. Denies any toxic ingestions at this time. Denies any auditory or visual hallucinations. Denies any homicidal ideations. Does not take medication for depression at this time. Notes decreased sleep. Spoke to a counselor before arrival, [facility], and they are working on placement  Past Medical History  Diagnosis Date  . Cystitis, interstitial    Past Surgical History  Procedure Laterality Date  . Tubal ligation     Family History  Problem Relation Age of Onset  . Cancer Father    History  Substance Use Topics  . Smoking status: Current Every Day Smoker -- 0.25 packs/day    Types: Cigarettes  . Smokeless tobacco: Not on file  . Alcohol Use: Yes     Comment: occasionally   OB History   Grav Para Term Preterm Abortions TAB SAB Ect Mult Living                 Review of Systems  All other systems reviewed and are negative.    Allergies  Penicillins  Home Medications   Current Outpatient Rx  Name  Route  Sig  Dispense  Refill  . amitriptyline (ELAVIL) 25 MG tablet   Oral   Take 1 tablet (25 mg total) by mouth at bedtime as needed for sleep.   30 tablet   0   . omeprazole (PRILOSEC) 20 MG capsule      Take one capsule PO twice a day for 3 days, then one capsule PO once a day   30 capsule   0   . ondansetron (ZOFRAN ODT) 4 MG disintegrating tablet   Oral   Take 1 tablet (4 mg total) by mouth every 8 (eight) hours as needed for  nausea.   10 tablet   0   . ranitidine (ZANTAC) 150 MG capsule   Oral   Take 1 capsule (150 mg total) by mouth 2 (two) times daily.   60 capsule   3   . traMADol (ULTRAM) 50 MG tablet   Oral   Take 1 tablet (50 mg total) by mouth every 6 (six) hours as needed for pain.   15 tablet   0    BP 114/75  Pulse 84  Temp(Src) 99.2 F (37.3 C) (Oral)  Resp 18  SpO2 100%  LMP 05/16/2013 Physical Exam  Nursing note and vitals reviewed. Constitutional: She is oriented to person, place, and time. She appears well-developed and well-nourished.  Non-toxic appearance. No distress.  HENT:  Head: Normocephalic and atraumatic.  Eyes: Conjunctivae, EOM and lids are normal. Pupils are equal, round, and reactive to light.  Neck: Normal range of motion. Neck supple. No tracheal deviation present. No mass present.  Cardiovascular: Normal rate, regular rhythm and normal heart sounds.  Exam reveals no gallop.   No murmur heard. Pulmonary/Chest: Effort normal and breath sounds normal. No stridor. No respiratory distress. She has no decreased breath sounds. She has no wheezes. She has no rhonchi. She has no rales.  Abdominal: Soft. Normal appearance and bowel sounds are normal. She exhibits no distension. There is no tenderness. There is no rebound and no CVA tenderness.  Musculoskeletal: Normal range of motion. She exhibits no edema and no tenderness.  Neurological: She is alert and oriented to person, place, and time. She has normal strength. No cranial nerve deficit or sensory deficit. GCS eye subscore is 4. GCS verbal subscore is 5. GCS motor subscore is 6.  Skin: Skin is warm and dry. No abrasion and no rash noted.  Psychiatric: Her speech is normal. Her affect is blunt. She is withdrawn.    ED Course  Procedures (including critical care time) Labs Review Labs Reviewed  CBC - Abnormal; Notable for the following:    RBC 5.30 (*)    MCV 72.8 (*)    MCH 24.3 (*)    All other components within  normal limits  ACETAMINOPHEN LEVEL  COMPREHENSIVE METABOLIC PANEL  ETHANOL  SALICYLATE LEVEL  URINE RAPID DRUG SCREEN (HOSP PERFORMED)   Imaging Review No results found.  MDM  No diagnosis found. Patient to be medically cleared and placed by [facility]    Toy Baker, MD 06/03/13 1640

## 2013-06-03 NOTE — ED Notes (Signed)
Pt on phone with friend. 

## 2013-06-03 NOTE — ED Notes (Signed)
Pt given [place] sandwich, graham crackers, and a glass of water per pt's request.

## 2013-06-03 NOTE — ED Notes (Addendum)
Pt placed in paper scrubs, all belongings removed from bedside. Security at bedside wanding pt.

## 2013-06-03 NOTE — ED Notes (Signed)
Reports worsening depression x 2 months, also increasing alcohol use. Last drink Saturday. States has had SI with plans to "slash her wrists". States had lost her apartment & unable to work 2 months ago. States "I cry everyday".

## 2013-06-03 NOTE — ED Notes (Signed)
Report received from [facility]. RN. Pt now in room with trained sitter. Pt behavior calm and cooperative, no self injurous behaviors noted at this time. Pt a/ox4, NAD. Pt updated on plan of care.

## 2013-06-03 NOTE — ED Notes (Signed)
Pt reports that she has been experiencing a lot of increased stress over the past couple of weeks. States that she is a single mother and has been feeling depressed. Reports that she had thoughts of hurting herself today. Pt reports that she has a plan to cut her wrist. Denies any HI. No treatment in the past for th same.

## 2013-06-03 NOTE — ED Notes (Signed)
All of pt's clothing, purse, & belongings sent home with [place].  Pt left with 2 books & a small blanket/throw.

## 2013-06-04 MED ORDER — AMITRIPTYLINE HCL 25 MG PO TABS: 25.0000 mg | ORAL_TABLET | Freq: Every day | ORAL | Status: DC

## 2013-06-04 MED ORDER — SOLIFENACIN SUCCINATE 5 MG PO TABS: 5.0000 mg | ORAL_TABLET | Freq: Every day | ORAL | Status: DC

## 2013-06-04 MED ORDER — PANTOPRAZOLE SODIUM 40 MG PO TBEC
40.0000 mg | DELAYED_RELEASE_TABLET | Freq: Every day | ORAL | Status: DC
  Administered 2013-06-04: 40 mg via ORAL
  Filled 2013-06-04: qty 1

## 2013-06-04 MED ORDER — DARIFENACIN HYDROBROMIDE ER 7.5 MG PO TB24
7.5000 mg | ORAL_TABLET | Freq: Every day | ORAL | Status: DC
  Administered 2013-06-04: 7.5 mg via ORAL
  Filled 2013-06-04: qty 1

## 2013-06-04 NOTE — ED Notes (Signed)
SPOKE TO PHARMACIST. UNABLE TO ACCESS PT ELAVIL. IT WAS REQUESTED AT 0945 THIS MORNING BY TH PATIENT. THE PHARMACIST HAS NO EXPLANATION AS TO WHY THEY DID NOT MAKE THE MED AVAILABLE AS REQUESTED AT 0945.

## 2013-06-04 NOTE — ED Notes (Signed)
Pt states she does not want to be admitted to a behavioral health facility, ACT team member no longer in facility, will inform ACT team member in the morning. Pt states she would feel safe at home if she was discharged.

## 2013-06-04 NOTE — ED Notes (Signed)
PATIENT HAS SIGNED A CONTRACT FOR SAFETY. SHE HAS RECEIVED REFERRALS FOR MENTAL HEALTH

## 2013-06-04 NOTE — ED Notes (Signed)
SPOKE WITH CARLEE IN PHARMACY . SHE ADVISES THE PHARMACIST HAS TO VERIFY MEDS. IT MAY BE UP TO 30 MINUTES BEFORE MEDS ARE SENT

## 2013-06-04 NOTE — ED Notes (Signed)
PATIENTS MEDS HAVE BEEN ORDERED FROM PHARMACY. THEY HAVE NOT BEEN RECEIVED TO ADMINISTER TO PATIENT

## 2013-06-04 NOTE — BH Assessment (Signed)
Writer consulted with the nurse working the patient regarding the patient no meeting criteria for inpatient hospitalization. Writer informed the nurse (Anette) that the no harm contract would be sent the nurse so that the patient is able to sign.  Writer will also fax referral information for mobile crisis, [facility] and [employer] of the [place] and [place].  These agencies have programs that work with individuals on a sliding scale basis.  Patient will sign that she has received these referrals and the originals will be placed in the file and a consumer will receive a copy to take with her when discharged.

## 2013-06-04 NOTE — BH Assessment (Signed)
Tele Assessment Note   Patient is a 42 year old African American female. Patient reports when she came to the ER yesterday she had a plan to slit her wrist.  Patient reports increased levels of depression and thoughts of hurting herself since she lost her apartment and she is unable to work.  Patient reports that she has been unable to work for 2 months.  Patient now reports that she is able to contract for safety. Patient is currently working with [facility].  Patient reports that they brought her to the Emergency Room.   Patient denies a history of suicide.  Patient denies a family history of suicide.  Patient denies any psychiatric hospitalization in the past.  Patient denies any psychosis.  Patient denies any HI.  Patients deniers substance abuse.  Patients BAL is <11.  However, her UDS is positive for Barbiturates.       Axis I: Major Depression, single episode Axis II: Deferred Axis III:  Past Medical History  Diagnosis Date  . Cystitis, interstitial    Axis IV: economic problems, other psychosocial or environmental problems, problems related to social environment and problems with access to health care services Axis V: 41-50 serious symptoms  Past Medical History:  Past Medical History  Diagnosis Date  . Cystitis, interstitial     Past Surgical History  Procedure Laterality Date  . Tubal ligation      Family History:  Family History  Problem Relation Age of Onset  . Cancer Father     Social History:  reports that she has been smoking Cigarettes.  She has been smoking about 0.25 packs per day. She does not have any smokeless tobacco history on file. She reports that  drinks alcohol. She reports that she does not use illicit drugs.  Additional Social History:     CIWA: CIWA-Ar BP: 111/62 mmHg Pulse Rate: 82 COWS:    Allergies:  Allergies  Allergen Reactions  . Penicillins Itching and Rash    Home Medications:  (Not in a hospital  admission)  OB/GYN Status:  Patient's last menstrual period was 05/16/2013.  General Assessment Data Location of Assessment: [facility] ED Is this a Tele or Face-to-Face Assessment?: Tele Assessment Is this an Initial Assessment or a Re-assessment for this encounter?: Initial Assessment Living Arrangements: Children Can pt return to current living arrangement?: Yes Admission Status: Voluntary Is patient capable of signing voluntary admission?: Yes Transfer from: Acute Hospital Referral Source: Self/Family/Friend  Medical Screening Exam [facility] Walk-in ONLY) Medical Exam completed: Yes  [facility] Crisis Care Plan Living Arrangements: Children  Education Status Is patient currently in school?: No  Risk to self Suicidal Ideation: No Suicidal Intent: No Is patient at risk for suicide?: No Suicidal Plan?: No Access to Means: No What has been your use of drugs/alcohol within the last 12 months?: none reported Previous Attempts/Gestures: No How many times?: 0 Other Self Harm Risks: None Triggers for Past Attempts: Unpredictable Intentional Self Injurious Behavior: None Family Suicide History: No Recent stressful life event(s):  (overwhelmed with raising 5 children.) Persecutory voices/beliefs?: No Depression: Yes Depression Symptoms: Despondent;Insomnia;Tearfulness;Isolating;Guilt;Loss of interest in usual pleasures;Feeling worthless/self pity Substance abuse history and/or treatment for substance abuse?: No Suicide prevention information given to non-admitted patients: Yes  Risk to Others Homicidal Ideation: No Thoughts of Harm to Others: No Current Homicidal Intent: No Current Homicidal Plan: No Access to Homicidal Means: No Identified Victim: None History of harm to others?: No Assessment of Violence: None Noted Violent Behavior Description: calm Does  patient have access to weapons?: No Criminal Charges Pending?: No Does patient have a court date: No  Psychosis Hallucinations:  None noted Delusions: None noted  Mental Status Report Appear/Hygiene: Disheveled Eye Contact: Fair Motor Activity: Freedom of movement Speech: Logical/coherent Level of Consciousness: Alert Mood: Depressed Affect: Appropriate to circumstance Anxiety Level: None Thought Processes: Coherent;Relevant Judgement: Unimpaired Orientation: Person;Place;Time;Situation Obsessive Compulsive Thoughts/Behaviors: None  Cognitive Functioning Concentration: Decreased Memory: Recent Intact;Remote Intact IQ: Average Insight: Fair Impulse Control: Poor Appetite: Fair Weight Loss: 0 Weight Gain: 0 Sleep: No Change Total Hours of Sleep: 7 Vegetative Symptoms: Decreased grooming  ADLScreening [facility] Assessment Services) Patient's cognitive ability adequate to safely complete daily activities?: Yes Patient able to express need for assistance with ADLs?: Yes Independently performs ADLs?: Yes (appropriate for developmental age)  Prior Inpatient Therapy Prior Inpatient Therapy: No Prior Therapy Dates:  (None ) Prior Therapy Facilty/Provider(s): None  Reason for Treatment: N/A  Prior Outpatient Therapy Prior Outpatient Therapy: No Prior Therapy Dates: N/A Prior Therapy Facilty/Provider(s): N/A Reason for Treatment: N/A  ADL Screening (condition at time of admission) Patient's cognitive ability adequate to safely complete daily activities?: Yes Patient able to express need for assistance with ADLs?: Yes Independently performs ADLs?: Yes (appropriate for developmental age)                  Additional Information 1:1 In Past 12 Months?: No CIRT Risk: No Elopement Risk: No Does patient have medical clearance?: Yes     Disposition: Discharge after patient has signed a no harm contract.  Patient will be transitioned back to her current provider [facility] for outpatient services.  Patient will be given referral information for mobile crisis.  Disposition Initial  Assessment Completed for this Encounter: Yes Disposition of Patient: Other dispositions Other disposition(s): Other (Comment)  Phillip Heal LaVerne 06/04/2013 8:32 AM

## 2013-06-08 NOTE — ED Provider Notes (Signed)
Medical screening examination/treatment/procedure(s) were performed by non-physician practitioner and as supervising physician I was immediately available for consultation/collaboration.   Saajan Willmon Y. Vedika Dumlao, MD 06/08/13 2043 

## 2013-06-29 ENCOUNTER — Emergency Department (HOSPITAL_COMMUNITY)
Admission: EM | Admit: 2013-06-29 | Discharge: 2013-06-29 | Disposition: A | Discharge status: Home / Self Care | Payer: PRIVATE HEALTH INSURANCE | Attending: Emergency Medicine | Admitting: Emergency Medicine

## 2013-06-29 ENCOUNTER — Encounter (HOSPITAL_COMMUNITY): Payer: Self-pay | Admitting: Emergency Medicine

## 2013-06-29 DIAGNOSIS — Z79899 Other long term (current) drug therapy: Secondary | ICD-10-CM | POA: Insufficient documentation

## 2013-06-29 DIAGNOSIS — K219 Gastro-esophageal reflux disease without esophagitis: Secondary | ICD-10-CM | POA: Insufficient documentation

## 2013-06-29 DIAGNOSIS — F17200 Nicotine dependence, unspecified, uncomplicated: Secondary | ICD-10-CM | POA: Insufficient documentation

## 2013-06-29 DIAGNOSIS — Z880 Allergy status to penicillin: Secondary | ICD-10-CM | POA: Insufficient documentation

## 2013-06-29 MED ORDER — PANTOPRAZOLE SODIUM 20 MG PO TBEC: 20.0000 mg | DELAYED_RELEASE_TABLET | Freq: Every day | ORAL | Status: DC

## 2013-06-29 MED ORDER — ERYTHROMYCIN BASE 333 MG PO TBEC: 333.0000 mg | DELAYED_RELEASE_TABLET | Freq: Four times a day (QID) | ORAL | Status: DC

## 2013-06-29 NOTE — ED Provider Notes (Addendum)
CSN: [id number]     Arrival date & time 06/29/13  1478 History   First MD Initiated Contact with Patient 06/29/13 [phone]     Chief Complaint  Patient presents with  . Abdominal Pain   (Consider location/radiation/quality/duration/timing/severity/associated sxs/prior Treatment) HPI Comments: Jody [name] is a 42 y.o. Female who complains of epigastric discomfort for several weeks. Jody [name] was recently prescribed Prilosec but lost the prescription. Jody [name] would like another prescription, "something stronger." Jody [name] was referred to GI but cannot see them due to financial difficulty. Her epigastric discomfort is a burning sensation that radiates to her upper chest. It is present. Both day and night. Jody [name] denies fever, chills, nausea, vomiting, weakness, or dizziness. There are no other known modifying factors  Patient is a 42 y.o. female presenting with abdominal pain. The history is provided by the patient.  Abdominal Pain   Past Medical History  Diagnosis Date  . Cystitis, interstitial    Past Surgical History  Procedure Laterality Date  . Tubal ligation     Family History  Problem Relation Age of Onset  . Cancer Father    History  Substance Use Topics  . Smoking status: Current Every Day Smoker -- 0.25 packs/day    Types: Cigarettes  . Smokeless tobacco: Not on file  . Alcohol Use: Yes     Comment: occasionally   OB History   Grav Para Term Preterm Abortions TAB SAB Ect Mult Living                 Review of Systems  Gastrointestinal: Positive for abdominal pain.  All other systems reviewed and are negative.    Allergies  Penicillins  Home Medications   Current Outpatient Rx  Name  Route  Sig  Dispense  Refill  . amitriptyline (ELAVIL) 25 MG tablet   Oral   Take 1 tablet (25 mg total) by mouth at bedtime as needed for sleep.   30 tablet   0   . omeprazole (PRILOSEC) 20 MG capsule   Oral   Take 20 mg by mouth daily.         . pantoprazole (PROTONIX) 20 MG  tablet   Oral   Take 1 tablet (20 mg total) by mouth daily.   30 tablet   0   . solifenacin (VESICARE) 5 MG tablet   Oral   Take 1 tablet (5 mg total) by mouth daily.   30 tablet   0    BP 131/83  Pulse 90  Temp(Src) 98.2 F (36.8 C) (Oral)  Resp 16  SpO2 100%  LMP 05/16/2013 Physical Exam  Nursing note and vitals reviewed. Constitutional: Jody [name] is oriented to person, place, and time. Jody [name] appears well-developed and well-nourished.  HENT:  Head: Normocephalic and atraumatic.  Eyes: Conjunctivae and EOM are normal. Pupils are equal, round, and reactive to light.  Neck: Normal range of motion and phonation normal. Neck supple.  Cardiovascular: Normal rate, regular rhythm and intact distal pulses.   Pulmonary/Chest: Effort normal and breath sounds normal. Jody [name] exhibits no tenderness.  Abdominal: Soft. Jody [name] exhibits no distension and no mass. There is tenderness (mild epigastric discomfort to palpation). There is no rebound and no guarding.  Musculoskeletal: Normal range of motion.  Neurological: Jody [name] is alert and oriented to person, place, and time. Jody [name] exhibits normal muscle tone.  Skin: Skin is warm and dry.  Psychiatric: Jody [name] has a normal mood and affect. Her behavior is normal. Judgment and thought content normal.  ED Course  Procedures (including critical care time)     MDM   1. GERD (gastroesophageal reflux disease)    GERD, without evidence for dehydration, metabolic instability, or other acute chest or abdominal pathology. Jody [name] is stable for discharge.  Nursing Notes Reviewed/ Care Coordinated, and agree without changes. Applicable Imaging Reviewed.  Interpretation of Laboratory Data incorporated into ED treatment   Plan: Home Medications- Protonix; Home Treatments and Observation- fluids; return here if the recommended treatment, does not improve the symptoms; Recommended follow up- PCP of choice prn      Flint Melter, MD 06/29/13 [phone]   After d/c,  Jody [name] represented for left lower lateral incisor tooth pain, for 1 month. The tooth is broken off and occasionally swells and drains. On exam the tooth remnant is present with mild surrounding inflammation. No trismus. Rx Eryth. 333mg  #21  Flint Melter, MD 06/29/13 [phone]

## 2013-06-29 NOTE — ED Notes (Signed)
Pt states that she was seen at [facility] and was given a rx for Omeprazole for "ulocers." Pt adds that she is just here to get another rx. Pt reports that she has burning in her esophagus that "comes up into her mouth." Pt in NAD and A&O. Pt denies N/V/D, fever, cough.

## 2014-07-03 ENCOUNTER — Inpatient Hospital Stay (HOSPITAL_COMMUNITY)
Admission: AD | Admit: 2014-07-03 | Discharge: 2014-07-07 | DRG: 885 | Disposition: A | Discharge status: Home / Self Care | Payer: Federal, State, Local not specified - Other | Source: Intra-hospital | Attending: Psychiatry | Admitting: Psychiatry

## 2014-07-03 ENCOUNTER — Encounter (HOSPITAL_COMMUNITY): Payer: Self-pay | Admitting: Emergency Medicine

## 2014-07-03 ENCOUNTER — Emergency Department (HOSPITAL_COMMUNITY)
Admission: EM | Admit: 2014-07-03 | Discharge: 2014-07-03 | Disposition: A | Discharge status: Other Acute Inpatient Hospital | Payer: PRIVATE HEALTH INSURANCE | Attending: Emergency Medicine | Admitting: Emergency Medicine

## 2014-07-03 DIAGNOSIS — Z23 Encounter for immunization: Secondary | ICD-10-CM | POA: Diagnosis not present

## 2014-07-03 DIAGNOSIS — Z599 Problem related to housing and economic circumstances, unspecified: Secondary | ICD-10-CM

## 2014-07-03 DIAGNOSIS — Z79899 Other long term (current) drug therapy: Secondary | ICD-10-CM | POA: Insufficient documentation

## 2014-07-03 DIAGNOSIS — F17210 Nicotine dependence, cigarettes, uncomplicated: Secondary | ICD-10-CM | POA: Diagnosis present

## 2014-07-03 DIAGNOSIS — F418 Other specified anxiety disorders: Secondary | ICD-10-CM | POA: Insufficient documentation

## 2014-07-03 DIAGNOSIS — F333 Major depressive disorder, recurrent, severe with psychotic symptoms: Secondary | ICD-10-CM

## 2014-07-03 DIAGNOSIS — Z8742 Personal history of other diseases of the female genital tract: Secondary | ICD-10-CM | POA: Insufficient documentation

## 2014-07-03 DIAGNOSIS — F410 Panic disorder [episodic paroxysmal anxiety] without agoraphobia: Secondary | ICD-10-CM | POA: Diagnosis present

## 2014-07-03 DIAGNOSIS — Z792 Long term (current) use of antibiotics: Secondary | ICD-10-CM | POA: Insufficient documentation

## 2014-07-03 DIAGNOSIS — Z880 Allergy status to penicillin: Secondary | ICD-10-CM | POA: Insufficient documentation

## 2014-07-03 DIAGNOSIS — F323 Major depressive disorder, single episode, severe with psychotic features: Secondary | ICD-10-CM | POA: Diagnosis present

## 2014-07-03 DIAGNOSIS — F1020 Alcohol dependence, uncomplicated: Secondary | ICD-10-CM

## 2014-07-03 DIAGNOSIS — Z720 Tobacco use: Secondary | ICD-10-CM | POA: Insufficient documentation

## 2014-07-03 DIAGNOSIS — F22 Delusional disorders: Secondary | ICD-10-CM | POA: Insufficient documentation

## 2014-07-03 DIAGNOSIS — G4700 Insomnia, unspecified: Secondary | ICD-10-CM | POA: Diagnosis present

## 2014-07-03 HISTORY — DX: Panic disorder (episodic paroxysmal anxiety): F41.0

## 2014-07-03 HISTORY — DX: Anxiety disorder, unspecified: F41.9

## 2014-07-03 HISTORY — DX: Major depressive disorder, single episode, unspecified: F32.9

## 2014-07-03 HISTORY — DX: Depression, unspecified: F32.A

## 2014-07-03 LAB — CBC
HEMATOCRIT: 45.2 % (ref 36.0–46.0)
HEMOGLOBIN: 14.7 g/dL (ref 12.0–15.0)
MCH: 24.5 pg — ABNORMAL LOW (ref 26.0–34.0)
MCHC: 32.5 g/dL (ref 30.0–36.0)
MCV: 75.3 fL — AB (ref 78.0–100.0)
Platelets: 240 10*3/uL (ref 150–400)
RBC: 6 MIL/uL — ABNORMAL HIGH (ref 3.87–5.11)
RDW: 14.6 % (ref 11.5–15.5)
WBC: 5.2 10*3/uL (ref 4.0–10.5)

## 2014-07-03 LAB — COMPREHENSIVE METABOLIC PANEL
ALK PHOS: 65 U/L (ref 39–117)
ALT: <5 U/L (ref 0–35)
ANION GAP: 13 (ref 5–15)
AST: 16 U/L (ref 0–37)
Albumin: 4.2 g/dL (ref 3.5–5.2)
BILIRUBIN TOTAL: 0.4 mg/dL (ref 0.3–1.2)
BUN: 4 mg/dL — AB (ref 6–23)
CHLORIDE: 104 meq/L (ref 96–112)
CO2: 23 mEq/L (ref 19–32)
CREATININE: 0.69 mg/dL (ref 0.50–1.10)
Calcium: 9.2 mg/dL (ref 8.4–10.5)
GLUCOSE: 98 mg/dL (ref 70–99)
POTASSIUM: 5 meq/L (ref 3.7–5.3)
Sodium: 140 mEq/L (ref 137–147)
Total Protein: 8.1 g/dL (ref 6.0–8.3)

## 2014-07-03 LAB — ACETAMINOPHEN LEVEL

## 2014-07-03 LAB — RAPID URINE DRUG SCREEN, HOSP PERFORMED
Amphetamines: NOT DETECTED
BARBITURATES: NOT DETECTED
Benzodiazepines: NOT DETECTED
COCAINE: NOT DETECTED
Opiates: NOT DETECTED
TETRAHYDROCANNABINOL: NOT DETECTED

## 2014-07-03 LAB — SALICYLATE LEVEL: Salicylate Lvl: <2 mg/dL — ABNORMAL LOW (ref 2.8–20.0)

## 2014-07-03 LAB — ETHANOL

## 2014-07-03 MED ORDER — ACETAMINOPHEN 325 MG PO TABS: 650.0000 mg | ORAL_TABLET | ORAL | Status: DC | PRN

## 2014-07-03 MED ORDER — TRAZODONE HCL 50 MG PO TABS: 50.0000 mg | ORAL_TABLET | Freq: Every evening | ORAL | Status: DC | PRN

## 2014-07-03 MED ORDER — MAGNESIUM HYDROXIDE 400 MG/5ML PO SUSP: 30.0000 mL | Freq: Every day | ORAL | Status: DC | PRN

## 2014-07-03 MED ORDER — HALOPERIDOL 5 MG PO TABS
5.0000 mg | ORAL_TABLET | Freq: Three times a day (TID) | ORAL | Status: DC | PRN
  Administered 2014-07-03: 5 mg via ORAL
  Filled 2014-07-03: qty 1

## 2014-07-03 MED ORDER — ONDANSETRON HCL 4 MG PO TABS: 4.0000 mg | ORAL_TABLET | Freq: Three times a day (TID) | ORAL | Status: DC | PRN

## 2014-07-03 MED ORDER — LORAZEPAM 1 MG PO TABS: 0.0000 mg | ORAL_TABLET | Freq: Two times a day (BID) | ORAL | Status: DC

## 2014-07-03 MED ORDER — ALUM & MAG HYDROXIDE-SIMETH 200-200-20 MG/5ML PO SUSP: 30.0000 mL | ORAL | Status: DC | PRN

## 2014-07-03 MED ORDER — IBUPROFEN 200 MG PO TABS: 600.0000 mg | ORAL_TABLET | Freq: Three times a day (TID) | ORAL | Status: DC | PRN

## 2014-07-03 MED ORDER — ZOLPIDEM TARTRATE 5 MG PO TABS
5.0000 mg | ORAL_TABLET | Freq: Every evening | ORAL | Status: DC | PRN
Start: 2014-07-03 — End: 2014-07-03

## 2014-07-03 MED ORDER — LORAZEPAM 1 MG PO TABS: 1.0000 mg | ORAL_TABLET | Freq: Three times a day (TID) | ORAL | Status: DC | PRN

## 2014-07-03 MED ORDER — PANTOPRAZOLE SODIUM 20 MG PO TBEC
20.0000 mg | DELAYED_RELEASE_TABLET | Freq: Every day | ORAL | Status: DC
  Administered 2014-07-03: 20 mg via ORAL
  Filled 2014-07-03 (×2): qty 1

## 2014-07-03 MED ORDER — NICOTINE 21 MG/24HR TD PT24
21.0000 mg | MEDICATED_PATCH | Freq: Every day | TRANSDERMAL | Status: DC
  Administered 2014-07-03: 21 mg via TRANSDERMAL
  Filled 2014-07-03: qty 1

## 2014-07-03 MED ORDER — ACETAMINOPHEN 325 MG PO TABS
650.0000 mg | ORAL_TABLET | Freq: Four times a day (QID) | ORAL | Status: DC | PRN
  Administered 2014-07-05 (×2): 650 mg via ORAL
  Filled 2014-07-03 (×2): qty 2

## 2014-07-03 MED ORDER — LORAZEPAM 1 MG PO TABS
0.0000 mg | ORAL_TABLET | Freq: Four times a day (QID) | ORAL | Status: DC
  Administered 2014-07-03 (×2): 1 mg via ORAL
  Filled 2014-07-03 (×2): qty 1

## 2014-07-03 MED ORDER — LORAZEPAM 1 MG PO TABS: 1.0000 mg | ORAL_TABLET | Freq: Once | ORAL | Status: DC

## 2014-07-03 NOTE — ED Notes (Signed)
Attempted to contact with [name] no answer

## 2014-07-03 NOTE — ED Notes (Signed)
Pending [facility] transport

## 2014-07-03 NOTE — ED Notes (Signed)
Pt placed in Blue scrubs

## 2014-07-03 NOTE — ED Notes (Signed)
Pt states she feels very anxious and just wants to leave. Pt spoke on phone with family and was upset and yelling at them for telling someone she was here.

## 2014-07-03 NOTE — BH Assessment (Signed)
[facility] Thurman CoyerEric Kaplan, [facility] rooom [phone] has become available. Pt accepted By Dr. Geoffery LyonsIrving Lugo earlier today. Notified Dr. Gwendolyn GrantWalden and Cletis AthensJon, RN of acceptance.  Harlin RainFord Ellis Ria CommentWarrick Jr, LPC, [facility] Triage Specialist [phone]

## 2014-07-03 NOTE — ED Notes (Signed)
Security alerted to [employer]

## 2014-07-03 NOTE — ED Notes (Signed)
Plan of care discussed with pt.

## 2014-07-03 NOTE — ED Notes (Signed)
[phone]

## 2014-07-03 NOTE — ED Provider Notes (Signed)
CSN: [id number]     Arrival date & time 07/03/14  0901 History   First MD Initiated Contact with Patient 07/03/14 0911     Chief Complaint  Patient presents with  . Depression  . Psychiatric Evaluation  . Alcohol Problem     (Consider location/radiation/quality/duration/timing/severity/associated sxs/prior Treatment) HPI [name] is a 43 year old female with past medical history of anxiety, depression, panic attacks who presents to the ER with 4 months of worsening depression. Patient reports that she has been [facility] the past [facility], and after losing her job 4 months ago, her depression has worsened. Patient reports that also for the past 4 months she is increasingly noticed that she is constantly being followed whenever she leaves her house. Patient states she does not feel safe at home, and has also noticed people approaching her in public asking her questions about her boyfriend. Patient reports that she is not sure if her boyfriend is doing anything wrong, however she feels she's being approached every time she is in public by the same people who are all wearing green shirts. Patient's daughter reports that she has been driving patient around town for Aaron[place] and appointments, and has noticed that patient becomes extremely anxious while in public and expresses the fact that she feels they are all being followed. Patient's daughter also expresses that patient has been writing a license plate numbers of people she feels have been following her, and has noticed that the license plate numbers have subliminal messages in them. Patient reports since her depression has become worse she has been drinking approximately a 6 pack of beer everyday. Patient states he started drinking in the morning, injuring throughout the day until the evening. Patient denies any IV or street drug use. Patient denies any recent illness, chest pain, shortness of breath, dizziness, weakness, blurred  vision, headache, nausea, vomiting, diarrhea, dysuria. Patient also denies suicidal or homicidal ideation.   Past Medical History  Diagnosis Date  . Cystitis, interstitial   . Anxiety and depression   . Panic attack    Past Surgical History  Procedure Laterality Date  . Tubal ligation     History reviewed. No pertinent family history. History  Substance Use Topics  . Smoking status: Current Every Day Smoker -- 0.25 packs/day    Types: Cigarettes  . Smokeless tobacco: Not on file  . Alcohol Use: 3.6 oz/week    6 Cans of beer per week     Comment: reports drinking throughout the day "Beer"   OB History   Grav Para Term Preterm Abortions TAB SAB Ect Mult Living                 Review of Systems  Constitutional: Negative for fever.  HENT: Negative for trouble swallowing.   Eyes: Negative for visual disturbance.  Respiratory: Negative for shortness of breath.   Cardiovascular: Negative for chest pain.  Gastrointestinal: Negative for nausea, vomiting and abdominal pain.  Genitourinary: Negative for dysuria.  Musculoskeletal: Negative for neck pain.  Skin: Negative for rash.  Neurological: Negative for dizziness, syncope, weakness and numbness.  Psychiatric/Behavioral: Positive for hallucinations. Negative for suicidal ideas.      Allergies  Penicillins  Home Medications   Prior to Admission medications   Medication Sig Start Date End Date Taking? Authorizing Provider  erythromycin (ERY-TAB) 333 MG EC tablet Take 1 tablet (333 mg total) by mouth 4 (four) times daily. 06/29/13   Flint MelterElliott L Wentz, MD  pantoprazole (PROTONIX) 20  MG tablet Take 1 tablet (20 mg total) by mouth daily. 06/29/13   Flint Melter, MD   BP 119/69  Pulse 95  Temp(Src) 98.9 F (37.2 C) (Oral)  Resp 18  SpO2 100% Physical Exam  Constitutional: She is oriented to person, place, and time. She appears well-developed and well-nourished. No distress.  HENT:  Head: Normocephalic and atraumatic.   Mouth/Throat: Oropharynx is clear and moist. No oropharyngeal exudate.  Eyes: Right eye exhibits no discharge. Left eye exhibits no discharge. No scleral icterus.  Neck: Normal range of motion.  Cardiovascular: Normal rate, regular rhythm and normal heart sounds.   No murmur heard. Pulmonary/Chest: Effort normal and breath sounds normal. No respiratory distress.  Abdominal: Soft. There is no tenderness.  Musculoskeletal: Normal range of motion. She exhibits no edema and no tenderness.  Neurological: She is alert and oriented to person, place, and time. No cranial nerve deficit. Coordination normal.  Skin: Skin is warm and dry. No rash noted. She is not diaphoretic.  Psychiatric: She has a normal mood and affect.  Patient's mood is depressed with affect flat. Patient's speech is the normal place, nonslurred and appropriate. Patient behavior is appropriate. Thought content 4 pulse around her anxiety and depression. Patient's thought content also consists of delusions where patient believes for the past 4 months she is being followed by the "feds". Patient has expressed to her daughter both time that she feels increasingly anxious in public to to the people who were following them. She reports that mentioned green shirts consistently come up to her and ask her questions regarding the actions of her boyfriend, and states they are typically the same people who are asking her questions. Patient will not agree to tell me what questions they are asking her. Patient has also been recording the license plates of the people who she believes the following them for the past 4 months and reports noting subliminal messages in the numbers of the license plate. Patient has no plans of homicide or suicide. Cognition and memory appropriate. Judgment and insight impaired.    ED Course  Procedures (including critical care time) Labs Review Labs Reviewed  CBC - Abnormal; Notable for the following:    RBC 6.00 (*)     MCV 75.3 (*)    MCH 24.5 (*)    All other components within normal limits  COMPREHENSIVE METABOLIC PANEL - Abnormal; Notable for the following:    BUN 4 (*)    All other components within normal limits  SALICYLATE LEVEL - Abnormal; Notable for the following:    Salicylate Lvl <2.0 (*)    All other components within normal limits  ACETAMINOPHEN LEVEL  ETHANOL  URINE RAPID DRUG SCREEN (HOSP PERFORMED)    Imaging Review No results found.   EKG Interpretation None      MDM   Final diagnoses:  Delusions    43 year old female past medical history of anxiety depression, presenting with 4 months of increasing feels her depression and paranoid delusions of being followed by multiple people including "the feds". Patient stating that she feels unsafe in her home, and unsafe in public. We will place patient on ED psych hold and consult [name].  [name] agrees to admit patient and agrees to find placement.The patient appears reasonably stabilized for admission considering the current resources, flow, and capabilities available in the ED at this time, and I doubt any other [facility] requiring further screening and/or treatment in the ED prior to admission.  BP 119/69  Pulse 95  Temp(Src) 98.9 F (37.2 C) (Oral)  Resp 18  SpO2 100%  Signed,  Ladona Mow, PA-C 5:52 PM  This patient discussed with Dr. Warnell Forester, M.D.  Monte Fantasia, PA-C 07/03/14 1751  Monte Fantasia, PA-C 07/03/14 [phone]

## 2014-07-03 NOTE — ED Notes (Addendum)
Called to pt room for 3rd time. She is becoming irritable complaining that we "dont understand that i want to rest, i want something to help me". Patient becoming irritable and agitated because she is not getting any more medicine to put her to sleep

## 2014-07-03 NOTE — ED Notes (Signed)
PA at bedside.
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
## 2014-07-03 NOTE — ED Notes (Addendum)
[age] female presents with depression and stress for about a year. Pt reports sadness, hopelessness. Reports drinking beer multiple times throughout the day to cope. Denies visual and or auditory hallucinations. However daughter is at the bedside who brought the pt due to pt.s hallucinations both auditory and visual. Daughter reports pts condition is getting worse day by day and is seeking medical care for mom. Pt Alert/NAD.

## 2014-07-03 NOTE — BH Assessment (Addendum)
[facility] Assessment Progress Note Called Jinny SandersJoseph Mintz, Jody [name] @ [phone], as a tele assessment ordered for the pt.  Clinical information gained from PA and pt's appt scheduled with pt's nurse, Merry ProudBrandi, for 1145 with this clinician.  Casimer LaniusKristen Cayne Yom, MS, [facility] Licensed Professional Counselor Therapeutic Triage Specialist [facility] Phone: [phone] Fax: [phone]

## 2014-07-03 NOTE — ED Notes (Signed)
Pt resting/ sleeping, NAD, calm, interactive, resps e/u, speaking in clear complete sentences, voluntary form signed, pending transfer. EMTALA complete.

## 2014-07-04 ENCOUNTER — Encounter (HOSPITAL_COMMUNITY): Payer: Self-pay

## 2014-07-04 DIAGNOSIS — F333 Major depressive disorder, recurrent, severe with psychotic symptoms: Secondary | ICD-10-CM

## 2014-07-04 MED ORDER — FLUOXETINE HCL 20 MG PO CAPS
20.0000 mg | ORAL_CAPSULE | Freq: Every day | ORAL | Status: DC
  Administered 2014-07-04 – 2014-07-07 (×4): 20 mg via ORAL
  Filled 2014-07-04: qty 14
  Filled 2014-07-04 (×3): qty 1
  Filled 2014-07-04: qty 14
  Filled 2014-07-04 (×2): qty 1

## 2014-07-04 MED ORDER — HYDROXYZINE HCL 50 MG PO TABS
25.0000 mg | ORAL_TABLET | ORAL | Status: DC | PRN
  Administered 2014-07-04 – 2014-07-07 (×5): 25 mg via ORAL
  Filled 2014-07-04 (×5): qty 1
  Filled 2014-07-04: qty 20
  Filled 2014-07-04: qty 30

## 2014-07-04 MED ORDER — INFLUENZA VAC SPLIT QUAD 0.5 ML IM SUSY
0.5000 mL | PREFILLED_SYRINGE | INTRAMUSCULAR | Status: AC
  Administered 2014-07-05: 0.5 mL via INTRAMUSCULAR
  Filled 2014-07-04: qty 0.5

## 2014-07-04 MED ORDER — TRAZODONE HCL 100 MG PO TABS
100.0000 mg | ORAL_TABLET | Freq: Every day | ORAL | Status: DC
  Administered 2014-07-04: 100 mg via ORAL
  Filled 2014-07-04 (×4): qty 1

## 2014-07-04 MED ORDER — PNEUMOCOCCAL VAC POLYVALENT 25 MCG/0.5ML IJ INJ
0.5000 mL | INJECTION | INTRAMUSCULAR | Status: AC
  Administered 2014-07-05: 0.5 mL via INTRAMUSCULAR

## 2014-07-04 NOTE — Progress Notes (Signed)
Jody [name] is seen OOB [facility] on the 400 hall and she tolerates this very well.. She , initially, is skeptikal about being in the hopsital, but is warming up to the idea and she is starting to communicate to her nurses and techs and to relax a little.  A She completed part of her self inventory and on it she wrote she denies SI and she rates her depression, hopelessness and  Anxiety "         ".  R Safety is in place and poc moves forward.

## 2014-07-04 NOTE — Tx Team (Signed)
Initial Interdisciplinary Treatment Plan   PATIENT STRESSORS: Financial difficulties Medication change or noncompliance   PROBLEM LIST: Problem List/Patient Goals Date to be addressed Date deferred Reason deferred Estimated date of resolution  anxiety 07/03/2014     depression 07/03/2014     Medication noncompliant 07/03/2014     unemployed 07/03/2014     Risk for suicide 07/03/2014                              DISCHARGE CRITERIA:  Ability to meet basic life and health needs Improved stabilization in mood, thinking, and/or behavior Medical problems require only outpatient monitoring Verbal commitment to aftercare and medication compliance  PRELIMINARY DISCHARGE PLAN: Outpatient therapy Return to previous living arrangement  PATIENT/FAMIILY INVOLVEMENT: This treatment plan has been presented to and reviewed with the patient, Jody SpruceRosalind L Kawecki, and/or family member, The patient and family have been given the opportunity to ask questions and make suggestions.  JEHU-APPIAH, Geno Sydnor K 07/04/2014, 2:33 AM

## 2014-07-04 NOTE — [facility] Suicide Risk Assessment (Signed)
Suicide Risk Assessment  Admission Assessment     Nursing information obtained from:  Patient Demographic factors:  Divorced or widowed;Low socioeconomic status;Unemployed Current Mental Status:  NA Loss Factors:  Decrease in vocational status Historical Factors:  Prior suicide attempts;Family history of mental illness or substance abuse Risk Reduction Factors:  Responsible for children under 41 years of age;Positive social support Total Time spent with patient: 20 minutes  CLINICAL FACTORS:   Severe Anxiety and/or Agitation Depression:   Anhedonia Comorbid alcohol abuse/dependence Delusional Hopelessness Impulsivity Insomnia Alcohol/Substance Abuse/Dependencies Currently Psychotic  Psychiatric Specialty Exam:     Blood pressure 114/74, pulse 105, temperature 98.5 F (36.9 C), temperature source Oral, resp. rate 16, weight 60 kg (132 lb 4.4 oz), last menstrual period 06/15/2014.Body mass index is 22.69 kg/(m^2).  General Appearance: Fairly Groomed  Patent attorneyye Contact::  Fair  Speech:  Clear and Coherent  Volume:  Normal  Mood:  Anxious and Dysphoric  Affect:  Appropriate  Thought Process:  Goal Directed  Orientation:  Full (Time, Place, and Person)  Thought Content:  Paranoid Ideation  Suicidal Thoughts:  No  Homicidal Thoughts:  No  Memory:  Immediate;   Fair Recent;   Fair Remote;   Fair  Judgement:  Impaired  Insight:  Shallow  Psychomotor Activity:  Decreased  Concentration:  Fair  Recall:  FiservFair  Fund of Knowledge:Fair  Language: Good  Akathisia:  No  Handed:  Right  AIMS (if indicated):     Assets:  Communication Skills Desire for Improvement Physical Health  Sleep:  Number of Hours: 6.5   Musculoskeletal: Strength & Muscle Tone: within normal limits Gait & Station: normal Patient leans: N/A  COGNITIVE FEATURES THAT CONTRIBUTE TO RISK:  Closed-mindedness Polarized thinking    SUICIDE RISK:   Minimal: No identifiable suicidal ideation.  Patients  presenting with no risk factors but with morbid ruminations; may be classified as minimal risk based on the severity of the depressive symptoms  PLAN OF CARE:1. Admit for crisis management and stabilization. 2. Medication management to reduce current symptoms to base line and improve the     patient's overall level of functioning 3. Treat health problems as indicated. 4. Develop treatment plan to decrease risk of relapse upon discharge and the need for     readmission. 5. Psycho-social education regarding relapse prevention and self care. 6. Health care follow up as needed for medical problems. 7. Restart home medications where appropriate.   I certify that inpatient services furnished can reasonably be expected to improve the patient's condition.  Thedore MinsAkintayo, Justine Cossin, MD 07/04/2014, 12:28 PM

## 2014-07-04 NOTE — Progress Notes (Signed)
Patient ID: Jody SpruceRosalind L [name], female   DOB: [birth year], 43 y.o.   MRN: [id number] Admission note: D:Patient is a  Voluntary admission in no acute distress for increase depressed and anxiety. 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 Pt reports lost her insurance and was unable to afford medication. Pt report increase paranoid and has been drinking over 6 pack of beer daily to deal with her anxiety. Pt reports hx of SI with a plan to cut her wrist about a year. Pt denies SI/HI/AVH and pain.  A: Pt admitted to unit per protocol, skin assessment and belonging search done. No skin issues noted other than multiple tattoos. Consent signed by pt. Pt educated on therapeutic milieu rules. Pt was introduced to milieu by nursing staff. Fall risk safety plan explained to the patient. 15 minutes checks started for safety.  R: Pt was receptive to education. Writer offered support.

## 2014-07-04 NOTE — H&P (Signed)
Psychiatric Admission Assessment Adult  Patient Identification:  Jody [name] Date of Evaluation:  07/04/2014 Chief Complaint:  MDD WITH PSCH FEATURES History of Present Illness:: Evaluated patient today who was admitted from [facility] ER for major depressive disorder.  Patient reports poor sleep and appetite.  She also stated that she has been sad and depressed for 4 months since she lost her job due to lack of transportation.  She also states that she was seen at [facility] outpatient  psychiatric facility last year for depression and suicidal thoughts.  She stopped taking her medications as soon as she started feeling better and she also felt that her Zoloft was making her add weight.  This time around, her daughter encouraged her to come to the ER for care because she was feeling depressed and had a lot of stress going on at home.  She has 5 children, 3 in collage and two still living with her.  She states she struggles to provide for her children when she was working and that life is even harder now she does not have a job.  She also reports that she strongly believe that a particular type of green carfollows her around and she does not believe her mind is playing a trick on her.  She denies SI/HI/AVH.  She is calm, polite and cooperative.  We have started her on Prozac and Hydroxyzine for anxiety.  She will be receiving Trazodone for sleep.  We plan to re-evaluate her delusional thinking tomorrow and may start her on a low dose Haldol.  Her length of stay will be 2-5 days. Elements:  Location:  MAJOR DEPRESSION, DELUSIONAL THOUGHT, . Quality:  Insomnia, loss of appetite, depressed mood, umemployment, anxiety,. Severity:  moderate. Context:  Lost job, feeling stressed out, delusional thinking that people are watching her nad a particular green car follows her.. Associated Signs/Synptoms: Depression Symptoms:  insomnia, difficulty concentrating, hopelessness, anxiety, (Hypo) Manic Symptoms:   Delusions, Anxiety Symptoms:  Excessive Worry, Psychotic Symptoms:  Delusions, PTSD Symptoms: NA Total Time spent with patient: 1 hour  Psychiatric Specialty Exam: Physical Exam  ROS  Blood pressure 114/74, pulse 105, temperature 98.5 F (36.9 C), temperature source Oral, resp. rate 16, weight 60 kg (132 lb 4.4 oz), last menstrual period 06/15/2014.Body mass index is 22.69 kg/(m^2).  General Appearance: Casual and Fairly Groomed  Eye Contact::  Good  Speech:  Clear and Coherent and Normal Rate  Volume:  Normal  Mood:  Anxious, Depressed and Hopeless  Affect:  Congruent, Depressed and Flat  Thought Process:  Coherent, Goal Directed and Intact  Orientation:  Full (Time, Place, and Person)  Thought Content:  WDL  Suicidal Thoughts:  No  Homicidal Thoughts:  No  Memory:  Immediate;   Good Recent;   Good Remote;   Good  Judgement:  Good  Insight:  Good  Psychomotor Activity:  Normal  Concentration:  Good  Recall:  NA  Fund of Knowledge:Good  Language: Good  Akathisia:  NA  Handed:  Right  AIMS (if indicated):     Assets:  Desire for Improvement  Sleep:  Number of Hours: 6.5    Musculoskeletal: Strength & Muscle Tone: within normal limits Gait & Station: normal Patient leans: N/A  Past Psychiatric History: Diagnosis:  Hospitalizations:  Outpatient Care:  Substance Abuse Care:  Self-Mutilation:  Suicidal Attempts:  Violent Behaviors:   Past Medical History:   Past Medical History  Diagnosis Date  . Cystitis, interstitial   . Anxiety and depression   .  Panic attack    None. Allergies:   Allergies  Allergen Reactions  . Penicillins Itching and Rash   PTA Medications: Prescriptions prior to admission  Medication Sig Dispense Refill  . erythromycin (ERY-TAB) 333 MG EC tablet Take 1 tablet (333 mg total) by mouth 4 (four) times daily.  21 tablet  0  . pantoprazole (PROTONIX) 20 MG tablet Take 1 tablet (20 mg total) by mouth daily.  30 tablet  0     Previous Psychotropic Medications:  Medication/Dose                 Substance Abuse History in the last 12 months:  No.  Consequences of Substance Abuse: NA  Social History:  reports that she has been smoking Cigarettes.  She has been smoking about 0.25 packs per day. She does not have any smokeless tobacco history on file. She reports that she drinks about 3.6 ounces of alcohol per week. Her drug history is not on file. Additional Social History:   Current Place of Residence:   Place of Birth:   Family Members: Marital Status:  Divorced Children:  Sons:  Daughters: Relationships: Education:  completed 11th grade Educational Problems/Performance: Religious Beliefs/Practices: History of Abuse (Emotional/Phsycial/Sexual) Ship broker History:  None. Legal History: Hobbies/Interests:  Family History:  History reviewed. No pertinent family history.  Results for orders placed during the hospital encounter of 07/03/14 (from the past 72 hour(s))  URINE RAPID DRUG SCREEN (HOSP PERFORMED)     Status: None   Collection Time    07/03/14 10:52 AM      Result Value Ref Range   Opiates NONE DETECTED  NONE DETECTED   Cocaine NONE DETECTED  NONE DETECTED   Benzodiazepines NONE DETECTED  NONE DETECTED   Amphetamines NONE DETECTED  NONE DETECTED   Tetrahydrocannabinol NONE DETECTED  NONE DETECTED   Barbiturates NONE DETECTED  NONE DETECTED   Comment:            DRUG SCREEN FOR MEDICAL PURPOSES     ONLY.  IF CONFIRMATION IS NEEDED     FOR ANY PURPOSE, NOTIFY LAB     WITHIN 5 DAYS.                LOWEST DETECTABLE LIMITS     FOR URINE DRUG SCREEN     Drug Class       Cutoff (ng/mL)     Amphetamine      1000     Barbiturate      200     Benzodiazepine   465     Tricyclics       681     Opiates          300     Cocaine          300     THC              50  ACETAMINOPHEN LEVEL     Status: None   Collection Time    07/03/14 11:36 AM      Result  Value Ref Range   Acetaminophen (Tylenol), Serum <15.0  10 - 30 ug/mL   Comment:            THERAPEUTIC CONCENTRATIONS VARY     SIGNIFICANTLY. A RANGE OF 10-30     ug/mL MAY BE AN EFFECTIVE     CONCENTRATION FOR MANY PATIENTS.     HOWEVER, SOME ARE BEST TREATED     AT CONCENTRATIONS OUTSIDE  THIS     RANGE.     ACETAMINOPHEN CONCENTRATIONS     >150 ug/mL AT 4 HOURS AFTER     INGESTION AND >50 ug/mL AT 12     HOURS AFTER INGESTION ARE     OFTEN ASSOCIATED WITH TOXIC     REACTIONS.  CBC     Status: Abnormal   Collection Time    07/03/14 11:36 AM      Result Value Ref Range   WBC 5.2  4.0 - 10.5 K/uL   RBC 6.00 (*) 3.87 - 5.11 MIL/uL   Hemoglobin 14.7  12.0 - 15.0 g/dL   HCT 45.2  36.0 - 46.0 %   MCV 75.3 (*) 78.0 - 100.0 fL   MCH 24.5 (*) 26.0 - 34.0 pg   MCHC 32.5  30.0 - 36.0 g/dL   RDW 14.6  11.5 - 15.5 %   Platelets 240  150 - 400 K/uL  COMPREHENSIVE METABOLIC PANEL     Status: Abnormal   Collection Time    07/03/14 11:36 AM      Result Value Ref Range   Sodium 140  137 - 147 mEq/L   Potassium 5.0  3.7 - 5.3 mEq/L   Chloride 104  96 - 112 mEq/L   CO2 23  19 - 32 mEq/L   Glucose, Bld 98  70 - 99 mg/dL   BUN 4 (*) 6 - 23 mg/dL   Creatinine, Ser 0.69  0.50 - 1.10 mg/dL   Calcium 9.2  8.4 - 10.5 mg/dL   Total Protein 8.1  6.0 - 8.3 g/dL   Albumin 4.2  3.5 - 5.2 g/dL   AST 16  0 - 37 U/L   ALT <5  0 - 35 U/L   Alkaline Phosphatase 65  39 - 117 U/L   Total Bilirubin 0.4  0.3 - 1.2 mg/dL   GFR calc non Af Amer >90  >90 mL/min   GFR calc Af Amer >90  >90 mL/min   Comment: (NOTE)     The eGFR has been calculated using the CKD EPI equation.     This calculation has not been validated in all clinical situations.     eGFR's persistently <90 mL/min signify possible Chronic Kidney     Disease.   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 Cystitis, interstitial   . Anxiety and depression   . Panic attack    AXIS IV:  occupational  problems, other psychosocial or environmental problems and problems related to social environment AXIS V:  31-40 impairment in reality testing  Treatment Plan/Recommendations:   Admit for crisis management/stabilization. Review and reinstate any pertinent home medications for other health issues.   Medication management to treat current mood problems. Start taking Prozac 20 mg po daily for depression Trazodone 100 mg po at bed time for sleep Hydroxyzine 25 mg po every 4 hours as needed for anxiety Plan to add anti psychotic later for delusional thinking if needed Group counseling sessions and activities. Primary care consults as needed. Continue current treatment plan .  Treatment Plan Summary: Daily contact with patient to assess and evaluate symptoms and progress in treatment Medication management Current Medications:  Current Facility-Administered Medications  Medication Dose Route Frequency Provider Last Rate Last Dose  . acetaminophen (TYLENOL) tablet 650 mg  650 mg Oral Q6H PRN Evanna Glenda Chroman, NP      . alum & mag hydroxide-simeth (MAALOX/MYLANTA) 200-200-20 MG/5ML suspension 30 mL  30 mL Oral  Q4H PRN Evanna Glenda Chroman, NP      . FLUoxetine (PROZAC) capsule 20 mg  20 mg Oral Daily Gardner Servantes      . hydrOXYzine (ATARAX/VISTARIL) tablet 25 mg  25 mg Oral Q4H PRN Encarnacion Slates, NP   25 mg at 07/04/14 1030  . [START ON 07/05/2014] Influenza vac split quadrivalent PF (FLUARIX) injection 0.5 mL  0.5 mL Intramuscular Tomorrow-1000 Saramma Eappen, MD      . magnesium hydroxide (MILK OF MAGNESIA) suspension 30 mL  30 mL Oral Daily PRN Evanna Glenda Chroman, NP      . Derrill Memo ON 07/05/2014] pneumococcal 23 valent vaccine (PNU-IMMUNE) injection 0.5 mL  0.5 mL Intramuscular Tomorrow-1000 Saramma Eappen, MD      . traZODone (DESYREL) tablet 100 mg  100 mg Oral QHS Huntley Knoop        Observation Level/Precautions:  15 minute checks  Laboratory:  CBC Chemistry Profile UDS Urine preg  Psychotherapy:  Encourage to attend group therapy  Medications:  See above  Consultations:  As needed  Discharge Concerns:  Medication non compliant, relapse on alcohol  Estimated LOS: 2-5 days.  Other:     I certify that inpatient services furnished can reasonably be expected to improve the patient's condition.   Charmaine Downs, C   PMHNP-BC 10/3/20152:43 PM   Patient seen, evaluated and I agree with notes by Nurse Practitioner. Corena Pilgrim, MD

## 2014-07-04 NOTE — Progress Notes (Signed)
Patient ID: [name], female   DOB: 07/04/1971, 43 y.o.   MRN: [id number] Psychoeducational Group Note  Date:  07/04/2014 Time:1000am  Group Topic/Focus:  Identifying Needs:   The focus of this group is to help patients identify their personal needs that have been historically problematic and identify healthy behaviors to address their needs.  Participation Level:  Active  Participation Quality:  Appropriate  Affect:  Appropriate  Cognitive:  Appropriate  Insight:  Supportive  Engagement in Group:  Supportive  Additional Comments:  Inventory group   [name], Blessen Kimbrough Brooks 07/04/2014,1:02 PM

## 2014-07-04 NOTE — [facility] Group Notes (Signed)
[facility] Group Notes:  (Clinical Social Work)  07/04/2014  11:15-12:00PM  Summary of Progress/Problems:   The main focus of today's process group was to discuss patients' feelings about hospitalization, the stigma attached to mental health, and sources of motivation to stay well.  We then worked to identify a specific plan to avoid future hospitalizations when discharged from the hospital for this admission.  The patient expressed that she is in the hospital for stress and depression, but later was able to relate to other patients and reported that she has Bipolar Disorder.  She was able also to acknowledge that lack of sleep has a huge impact on making her manic.  She related to other patients who discussed how confusing it is to be in this hospital, and said she has relied on another patient to tell her what is happening.  She liked the concept of talking about Bipolar Disorder as a diagnosis instead of an identity.  Type of Therapy:  Group Therapy - Process  Participation Level:  Active  Participation Quality:  Attentive and Sharing  Affect:  Appropriate  Cognitive:  Appropriate and Oriented  Insight:  Engaged  Engagement in Therapy:  Engaged  Modes of Intervention:  Exploration, Discussion  Ambrose MantleMareida Grossman-Orr, LCSW 07/04/2014, 1:08 PM

## 2014-07-04 NOTE — ED Provider Notes (Signed)
Medical screening examination/treatment/procedure(s) were performed by non-physician practitioner and as supervising physician I was immediately available for consultation/collaboration.   Warnell Foresterrey Ritisha Deitrick, MD 07/04/14 [phone]

## 2014-07-04 NOTE — Progress Notes (Signed)
Psychoeducational Group Note  Date:  07/04/2014 Time:  2059  Group Topic/Focus:  Wrap-Up Group:   The focus of this group is to help patients review their daily goal of treatment and discuss progress on daily workbooks.  Participation Level: Did Not Attend  Participation Quality:  Not Applicable  Affect:  Not Applicable  Cognitive:  Not Applicable  Insight:  Not Applicable  Engagement in Group: Not Applicable  Additional Comments:  The patient did not attend group this evening since she was not feeling well.   Hazle CocaGOODMAN, Maebry Obrien S 07/04/2014, 8:58 PM

## 2014-07-04 NOTE — Progress Notes (Signed)
Patient ID: Jody [name], female   DOB: 1971-03-27, 43 y.o.   MRN: [id number] Psychoeducational Group Note  Date:  07/04/2014 Time:1030am  Group Topic/Focus:  Identifying Needs:   The focus of this group is to help patients identify their personal needs that have been historically problematic and identify healthy behaviors to address their needs.  Participation Level:  Active  Participation Quality:  Appropriate  Affect:  Anxious  Cognitive:  Appropriate  Insight:  Supportive  Engagement in Group:  Supportive  Additional Comments:  Healthy coping skills.   [name], Raynette Arras Brooks 07/04/2014,1:02 PM

## 2014-07-05 MED ORDER — HALOPERIDOL 5 MG PO TABS
5.0000 mg | ORAL_TABLET | Freq: Every day | ORAL | Status: DC
  Administered 2014-07-05 – 2014-07-06 (×2): 5 mg via ORAL
  Filled 2014-07-05 (×2): qty 1
  Filled 2014-07-05: qty 14
  Filled 2014-07-05: qty 1
  Filled 2014-07-05: qty 14
  Filled 2014-07-05: qty 1

## 2014-07-05 MED ORDER — ZOLPIDEM TARTRATE 5 MG PO TABS
5.0000 mg | ORAL_TABLET | Freq: Every day | ORAL | Status: DC
  Administered 2014-07-05 – 2014-07-06 (×2): 5 mg via ORAL
  Filled 2014-07-05 (×2): qty 1

## 2014-07-05 MED ORDER — BENZTROPINE MESYLATE 0.5 MG PO TABS
0.5000 mg | ORAL_TABLET | Freq: Every day | ORAL | Status: DC
  Administered 2014-07-05 – 2014-07-06 (×2): 0.5 mg via ORAL
  Filled 2014-07-05 (×2): qty 1
  Filled 2014-07-05: qty 14
  Filled 2014-07-05 (×2): qty 1
  Filled 2014-07-05: qty 14

## 2014-07-05 MED ORDER — LORAZEPAM 1 MG PO TABS
1.0000 mg | ORAL_TABLET | Freq: Once | ORAL | Status: AC
  Administered 2014-07-05: 1 mg via ORAL
  Filled 2014-07-05: qty 1

## 2014-07-05 NOTE — Progress Notes (Signed)
Patient ID: Jody SpruceRosalind L Colson, female   DOB: 08/13/1971, 43 y.o.   MRN: [id number] Psychoeducational Group Note  Date:  07/05/2014 Time:  1030am  Group Topic/Focus:  Making Healthy Choices:   The focus of this group is to help patients identify negative/unhealthy choices they were using prior to admission and identify positive/healthier coping strategies to replace them upon discharge.  Participation Level:  Active  Participation Quality:  Appropriate  Affect:  Appropriate  Cognitive:  Appropriate  Insight:  Supportive  Engagement in Group:  Supportive  Additional Comments:  Healthy support systems  [name], [name] 07/05/2014,3:40 PM

## 2014-07-05 NOTE — Progress Notes (Signed)
D: Pt mood is depressed. She expressed concern about not being able to sleep. Did not attend group tonight. Pleasant and cooperative.  A: Support given. Verbalization encouraged. Medications given as prescribed. Encouraged pt to come to staff with any concerns.  R: Pt is receptive. No complaints of pain or discomfort at this time. Q15 min safety checks maintained. Pt remains safe on the unit. Will continue to monitor pt.

## 2014-07-05 NOTE — [facility] Group Notes (Signed)
[facility] Group Notes:  (Clinical Social Work)  07/05/2014   11:15am-12:00pm  Summary of Progress/Problems:  The main focus of today's process group was to listen to a variety of genres of music and to identify that different types of music provoke different responses.  The patient then was able to identify personally what was soothing for them, as well as energizing.  Handouts were used to record feelings evoked, as well as how patient can personally use this knowledge in sleep habits, with depression, and with other symptoms.  The patient expressed understanding of concepts, as well as knowledge of how each type of music affected him/her and how this can be used at home as a wellness/recovery tool.  She participated fully and with great enjoyment, particularly during a gospel song which seemed to affect her emotionally in a positive way.  She asked to hug CSW after group.  Type of Therapy:  Music Therapy   Participation Level:  Active  Participation Quality:  Attentive and Sharing  Affect:  Appropriate  Cognitive:  Oriented  Insight:  Engaged  Engagement in Therapy:  Engaged  Modes of Intervention:   Activity, Exploration  Ambrose MantleMareida Grossman-Orr, LCSW 07/05/2014, 12:30pm

## 2014-07-05 NOTE — Progress Notes (Signed)
[facility] MD Progress Note  07/05/2014 1:55 PM Jody [name]  MRN:  [id number] Subjective:    Evaluated patient today who reports poor sleep despite taking Trazodone.  Patient also still believes she is being followed by people and that she is being monitored.  Patient seems anxious and worried and stated that she does not know why people should bother her.  She does not remember the name of one of the medications given to her a year ago for depression.  Patient denies SI/HI/AVH but still voices paranoia.  She has agreed to start on a low dose Haldol for her paranoia and she will be place on Ambien for her poor sleep.  Trazodone will be discontinued.  Patient was encouraged to come to group daily. 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 Cystitis, interstitial   . Anxiety and depression   . Panic attack    Axis IV: economic problems, occupational problems, other psychosocial or environmental problems and problems related to social environment Axis V: 31-40 impairment in reality testing  ADL's:  Intact  Sleep: Poor  Appetite:  Good  Suicidal Ideation:  Denies Homicidal Ideation:  Denies   Psychiatric Specialty Exam: Physical Exam  ROS  Blood pressure 127/78, pulse 111, temperature 99 F (37.2 C), temperature source Oral, resp. rate 16, weight 60 kg (132 lb 4.4 oz), last menstrual period 06/15/2014.Body mass index is 22.69 kg/(m^2).  General Appearance: Casual  Eye Contact::  Good  Speech:  Clear and Coherent and Normal Rate  Volume:  Normal  Mood:  Depressed  Affect:   Congruent, Depressed and Flat  Thought Process:  Coherent, Goal Directed and Intact  Orientation:  Full (Time, Place, and Person)  Thought Content:  Delusions  Suicidal Thoughts:  No  Homicidal Thoughts:  No  Memory:  Immediate;   Good Recent;   Good Remote;   Good  Judgement:  Fair  Insight:  Fair  Psychomotor Activity:  Normal  Concentration:  Good  Recall:  NA  Fund of Knowledge:Good  Language: Good  Akathisia:  NA  Handed:  Right  AIMS (if indicated):     Assets:  Desire for Improvement  Sleep:  Number of Hours: 6.75   Musculoskeletal: Strength & Muscle Tone: within normal limits Gait & Station: normal Patient leans: N/A  Current Medications: Current Facility-Administered Medications  Medication Dose Route Frequency Provider Last Rate Last Dose  . acetaminophen (TYLENOL) tablet 650 mg  650 mg Oral Q6H PRN Audrea Muscat, NP   650 mg at 07/05/14 0836  . alum & mag hydroxide-simeth (MAALOX/MYLANTA) 200-200-20 MG/5ML suspension 30 mL  30 mL Oral Q4H PRN Evanna Janann August, NP      . benztropine (COGENTIN) tablet 0.5 mg  0.5 mg Oral QHS Earney Navy, NP      . FLUoxetine (PROZAC) capsule 20 mg  20 mg Oral Daily Rooney Gladwin   20 mg at 07/05/14 0836  . haloperidol (HALDOL) tablet 5 mg  5 mg Oral QHS Earney Navy, NP      . hydrOXYzine (ATARAX/VISTARIL) tablet 25 mg  25 mg Oral Q4H PRN Sanjuana Kava, NP   25  mg at 07/05/14 0836  . Influenza vac split quadrivalent PF (FLUARIX) injection 0.5 mL  0.5 mL Intramuscular Tomorrow-1000 Saramma Eappen, MD      . magnesium hydroxide (MILK OF MAGNESIA) suspension 30 mL  30 mL Oral Daily PRN Evanna Cori Burkett, NP      . pneumococcal 23 valent vaccine (PNU-IMMUNE) injection 0.5 mL  0.5 mL Intramuscular Tomorrow-1000 Saramma Eappen, MD      . zolpidem (AMBIEN) tablet 5 mg  5 mg Oral QHS Earney NavyJosephine C Onuoha, NP        Lab Results: No results found for this or any previous visit (from the past 48 hour(s)).  Physical  Findings: AIMS: Facial and Oral Movements Muscles of Facial Expression: None, normal Lips and Perioral Area: None, normal Jaw: None, normal Tongue: None, normal,Extremity Movements Upper (arms, wrists, hands, fingers): None, normal Lower (legs, knees, ankles, toes): None, normal, Trunk Movements Neck, shoulders, hips: None, normal, Overall Severity Severity of abnormal movements (highest score from questions above): None, normal Incapacitation due to abnormal movements: None, normal Patient's awareness of abnormal movements (rate only patient's report): No Awareness, Dental Status Current problems with teeth and/or dentures?: No Does patient usually wear dentures?: No  CIWA:    COWS:     Treatment Plan Summary: Daily contact with patient to assess and evaluate symptoms and progress in treatment Medication management  Plan:  Continue with plan of care Continue crisis management Encourage to participate in group and individual sessions Continue medication management/ and review as needed Start taking Ambien 5 mg po at bed time for sleep Start taking Haldol 5 mg at bed time for Psychosis Cogentin 0.5 mg po at bed time to prevent medication induced EPS. Hydroxyzine 25 mg every 4 hours as needed for anxiety Prozac 20 mg po daily for depression Discharge  Plan in progress Address health issues /V/S as needed    Medical Decision Making Problem Points:  Established problem, stable/improving (1) Data Points:  Review and summation of old records (2) Review of medication regiment & side effects (2) Review of new medications or change in dosage (2)  I certify that inpatient services furnished can reasonably be expected to improve the patient's condition.   Dahlia ByesONUOHA, JOSEPHINE, C   PMHNP-BC 07/05/2014, 1:55 PM  Patient seen, evaluated and I agree with notes by Nurse Practitioner. Thedore MinsMojeed Dareion Kneece, MD

## 2014-07-05 NOTE — Progress Notes (Signed)
Patient ID: Geoffery SpruceRosalind L Quincy, female   DOB: 1971/02/28, 43 y.o.   MRN: [id number] Psychoeducational Group Note  Date:  07/05/2014 Time:  1015am  Group Topic/Focus:  Making Healthy Choices:   The focus of this group is to help patients identify negative/unhealthy choices they were using prior to admission and identify positive/healthier coping strategies to replace them upon discharge.  Participation Level:  Active  Participation Quality:  Appropriate  Affect:  Appropriate  Cognitive:  Appropriate  Insight:  Supportive  Engagement in Group:  Supportive  Additional Comments:  Inventory group   [name], Kamori Barbier Brooks 07/05/2014,3:40 PM

## 2014-07-05 NOTE — Progress Notes (Signed)
D Cameryn cont to struggle . She was able to speak to the dr today, about her inability to stay asleep and he [place] as well as haldol with cogentin ( she cont to be paranoid and responding to voices).   A She received 2 tylenol  For c/o headache and a vistaril , and then  1 mg of ativan, because she got ( only minimal relief ) and then she finally was able to relax.  She completed her morning self assessment and  On it she wrote she denied SI within the past 24 hrs and she rated her depression and anxiety and hopelessness "11/03/08", repsectively.   R POC in place and will cont to support and foster therapeutic relationship.

## 2014-07-05 NOTE — Progress Notes (Signed)
NUTRITION ASSESSMENT  Pt identified as at risk on the Malnutrition Screen Tool  INTERVENTION: 1. Educated patient on the importance of nutrition and encouraged intake of food and beverages. 2. Discussed weight goals.  NUTRITION DIAGNOSIS: Unintentional weight loss related to sub-optimal intake as evidenced by pt report.   Goal: Pt to meet >/= 90% of their estimated nutrition needs.  Monitor:  PO intake  Assessment:  Patient with major depressive disorder. She reports poor sleep and poor appetite. Unknown if she has lost weight recently, but 8 pound weight loss over the last year.   43 y.o. female  Height: Ht Readings from Last 1 Encounters:  06/01/13 5\' 4"  (1.626 m)    Weight: Wt Readings from Last 1 Encounters:  07/04/14 132 lb 4.4 oz (60 kg)    Weight Hx: Wt Readings from Last 10 Encounters:  07/04/14 132 lb 4.4 oz (60 kg)  06/01/13 140 lb (63.504 kg)    BMI:  Body mass index is 22.69 kg/(m^2). Pt meets criteria for normal weight based on current BMI.  Estimated Nutritional Needs: Kcal: 25-30 kcal/kg Protein: > 1 gram protein/kg Fluid: 1 ml/kcal  Diet Order: General Pt is also offered choice of unit snacks mid-morning and mid-afternoon.  Pt is eating as desired.   Lab results and medications reviewed.   Linnell FullingElyse Jamion Carter, RD, LDN Pager #: [phone] After-Hours Pager #: [phone]

## 2014-07-05 NOTE — Progress Notes (Signed)
Adult Psychoeducational Group Note  Date:  07/05/2014 Time:  9:19 PM  Group Topic/Focus:  Wrap-Up Group:   The focus of this group is to help patients review their daily goal of treatment and discuss progress on daily workbooks.  Participation Level:  Active  Participation Quality:  Appropriate  Affect:  Appropriate  Cognitive:  Appropriate  Insight: Appropriate  Engagement in Group:  Engaged  Modes of Intervention:  Discussion  Additional Comments: The patient expressed that she had a good day.The patient expressed that she enjoyed music therapy.  Octavio Mannshigpen, Clance Baquero Lee 07/05/2014, 9:19 PM

## 2014-07-06 DIAGNOSIS — F1099 Alcohol use, unspecified with unspecified alcohol-induced disorder: Secondary | ICD-10-CM

## 2014-07-06 MED ORDER — IBUPROFEN 200 MG PO TABS
400.0000 mg | ORAL_TABLET | Freq: Three times a day (TID) | ORAL | Status: DC | PRN
  Administered 2014-07-06 – 2014-07-07 (×2): 400 mg via ORAL
  Filled 2014-07-06 (×2): qty 2

## 2014-07-06 NOTE — Progress Notes (Signed)
[facility]  07/06/2014 12:51 PM Jody SpruceRosalind L [name]  MRN:  [id number] Subjective:   "Patient states." I am doing fine ,I feel good with regards to my mood and I sleeping OK". " I want to go home since I need to be with my 41 year old."  Objective: Patient seen and chart reviewed. Patient continues to improve. Patient denies any SI/HI/AH/VH today. Her mood is improving and she reports sleep as better. Patient denies any side effects of medications. Patient would like to get her medicaid application started while she is here. Patient appears to be anxious about her situation at home and her children who are alone.  Diagnosis:   DSM5: Primary Psychiatric Diagnosis: Major depressive disorder,recurrent ,severe with psychotic features.   Secondary Psychiatric Diagnosis: Alcohol use disorder,moderate   Non Psychiatric Diagnosis: Hx of interstitial cystitis          Total Time spent with patient: 30 minutes   Past Medical History  Diagnosis Date  . Cystitis, interstitial   . Anxiety and depression   . Panic attack    ADL's:  Intact  Sleep: Poor  Appetite:  Good    Psychiatric Specialty Exam: Physical Exam  ROS  Blood pressure 139/83, pulse 75, temperature 99 F (37.2 C), temperature source Oral, resp. rate 16, weight 60 kg (132 lb 4.4 oz), last menstrual period 06/15/2014.Body mass index is 22.69 kg/(m^2).  General Appearance: Casual  Eye Contact::  Good  Speech:  Clear and Coherent and Normal Rate  Volume:  Normal  Mood:  Depressed improving  Affect:  Congruent, Depressed and Flat  Thought Process:  Coherent, Goal Directed and Intact  Orientation:  Full (Time, Place, and Person)  Thought Content:  Delusions improving  Suicidal Thoughts:  No  Homicidal Thoughts:  No  Memory:  Immediate;   Good Recent;   Good Remote;   Good  Judgement:  Fair  Insight:  Fair  Psychomotor Activity:  Normal  Concentration:  Good  Recall:  NA  Fund of Knowledge:Good   Language: Good  Akathisia:  NA  Handed:  Right  AIMS (if indicated):   0  Assets:  Desire for Improvement  Sleep:  Number of Hours: 6.75   Musculoskeletal: Strength & Muscle Tone: within normal limits Gait & Station: normal Patient leans: N/A  Current Medications: Current Facility-Administered Medications  Medication Dose Route Frequency Provider Last Rate Last Dose  . acetaminophen (TYLENOL) tablet 650 mg  650 mg Oral Q6H PRN Audrea MuscatEvanna Cori Burkett, NP   650 mg at 07/05/14 2103  . alum & mag hydroxide-simeth (MAALOX/MYLANTA) 200-200-20 MG/5ML suspension 30 mL  30 mL Oral Q4H PRN Evanna Janann Augustori Burkett, NP      . benztropine (COGENTIN) tablet 0.5 mg  0.5 mg Oral QHS Earney NavyJosephine C Onuoha, NP   0.5 mg at 07/05/14 2104  . FLUoxetine (PROZAC) capsule 20 mg  20 mg Oral Daily Mojeed Akintayo   20 mg at 07/06/14 0811  . haloperidol (HALDOL) tablet 5 mg  5 mg Oral QHS Earney NavyJosephine C Onuoha, NP   5 mg at 07/05/14 2103  . hydrOXYzine (ATARAX/VISTARIL) tablet 25 mg  25 mg Oral Q4H PRN Sanjuana KavaAgnes I Nwoko, NP   25 mg at 07/05/14 0836  . magnesium hydroxide (MILK OF MAGNESIA) suspension 30 mL  30 mL Oral Daily PRN Evanna Janann Augustori Burkett, NP      . zolpidem (AMBIEN) tablet 5 mg  5 mg Oral QHS Earney NavyJosephine C Onuoha, NP   5 mg at 07/05/14  2104    Lab Results: No results found for this or any previous visit (from the past 48 hour(s)).  Physical Findings: AIMS: Facial and Oral Movements Muscles of Facial Expression: None, normal Lips and Perioral Area: None, normal Jaw: None, normal Tongue: None, normal,Extremity Movements Upper (arms, wrists, hands, fingers): None, normal Lower (legs, knees, ankles, toes): None, normal, Trunk Movements Neck, shoulders, hips: None, normal, Overall Severity Severity of abnormal movements (highest score from questions above): None, normal Incapacitation due to abnormal movements: None, normal Patient's awareness of abnormal movements (rate only patient's report): No Awareness, Dental  Status Current problems with teeth and/or dentures?: No Does patient usually wear dentures?: No  CIWA:    COWS:     Treatment Plan Summary: Daily contact with patient to assess and evaluate symptoms and progress in treatment Medication management  Plan:  Continue with plan of care Continue crisis management Encourage to participate in group and individual sessions Continue medication management/ and review as needed Continue taking Ambien 5 mg po at bed time for sleep Continue  taking Haldol 5 mg at bed time for Psychosis Cogentin 0.5 mg po at bed time to prevent medication induced EPS. Hydroxyzine 25 mg every 4 hours as needed for anxiety Will continue Prozac 20 mg po daily for depression Reviewed labs -will order EKG,Hba1c,lipid panel as well as TSH. Discharge  Plan in progress Address health issues /V/S as needed    Medical Decision Making Problem Points:  Established problem, stable/improving (1) Data Points:  Review and summation of old records (2) Review of medication regiment & side effects (2) Review of new medications or change in dosage (2)  I certify that inpatient services furnished can reasonably be expected to improve the patient's condition.   Amnah Breuer  ,MD   07/06/2014, 12:51 PM

## 2014-07-06 NOTE — [facility] Group Notes (Signed)
[facility] Note   07/06/2014 4:27 PM  Participation Quality:  Engaged  Mood/Affect:  Appropriate  Depression Rating:  3  Anxiety Rating:  2  Thoughts of Suicide:  No Will you contract for safety?   NA  Current AVH:  No  Plan for Discharge/Comments:  States that she was stressed and depressed prior to admission secondary to losing her job about 4 months ago, that her oldest daughter is a good support, that she feels that being here is helping her regroup, and that she wants to apply for MCD.  Plans to return home and follow up outpt  Transportation Means: family  Supports: family  Kiribatiorth, Baldo DaubRodney B

## 2014-07-06 NOTE — Progress Notes (Signed)
The focus of this group is to help patients review their daily goal of treatment and discuss progress on daily workbooks. Pt attended the evening group session and responded to all discussion prompts from the Writer. Pt shared that today was a good day on the unit. "The Lord made today and it is good no matter what happens." When asked to expand upon that answer, Pt told the group that she had something weighing heavy on her heart today that the Lord gave her an answer to today. "The Lord works in mysterious ways." Pt's affect was appropriate and when asked she did not have any additional requests from Nursing Staff this evening.

## 2014-07-06 NOTE — Tx Team (Signed)
  Interdisciplinary Treatment Plan Update   Date Reviewed:  07/06/2014  Time Reviewed:  8:04 AM  Progress in Treatment:   Attending groups: Yes Participating in groups: Yes Taking medication as prescribed: Yes  Tolerating medication: Yes Family/Significant other contact made: No Patient understands diagnosis: Yes AEB asking for help with depression and anxiety Discussing patient identified problems/goals with staff: Yes  See initial care plan Medical problems stabilized or resolved: Yes Denies suicidal/homicidal ideation: Yes  In tx team Patient has not harmed self or others: Yes  For review of initial/current patient goals, please see plan of care.  Estimated Length of Stay:  Likely d/c tomorrow  Reason for Continuation of Hospitalization: Medication stabilization  New Problems/Goals identified:  N/A  Discharge Plan or Barriers:   return home, follow up outpt  Additional Comments:  Patient reports poor sleep and appetite. She also stated that she has been sad and depressed for 4 months since she lost her job due to lack of transportation. She also states that [facility] for depression and suicidal thoughts. She stopped taking her medications as soon as she started feeling better and she also felt that her Zoloft was making her add weight. This time around, her daughter encouraged her to come to the ER for care because she was feeling depressed and had a lot of stress going on at home. She has 5 children, 3 in collage and two still living with her. She states she struggles to provide for her children when she was working and that life is even harder now she does not have a job. She also reports that she strongly believe that a particular type of green carfollows her around and she does not believe her mind is playing a trick on her. She denies SI/HI/AVH. She is calm, polite and cooperative. We have started her on Prozac and Hydroxyzine  for anxiety. She will be receiving Trazodone for sleep.  Haldol also added for paranoia.   Attendees:  Signature: Ivin BootySarama Eappen, MD 07/06/2014 8:04 AM   Signature: Richelle Itood Jakeim Sedore, LCSW 07/06/2014 8:04 AM  Signature: Fransisca KaufmannLaura Davis, NP 07/06/2014 8:04 AM  Signature: Joslyn Devonaroline Beaudry, RN 07/06/2014 8:04 AM  Signature: Liborio NixonPatrice White, RN 07/06/2014 8:04 AM  Signature:  07/06/2014 8:04 AM  Signature:   07/06/2014 8:04 AM  Signature:    Signature:    Signature:    Signature:    Signature:    Signature:      Scribe for Treatment Team:   Richelle Itood Ritchard Paragas, LCSW  07/06/2014 8:04 AM

## 2014-07-06 NOTE — [facility] Group Notes (Signed)
[facility] LCSW Group Therapy  07/06/2014 1:15 pm  Type of Therapy: Process Group Therapy  Participation Level:  Active  Participation Quality:  Appropriate  Affect:  Flat  Cognitive:  Oriented  Insight:  Improving  Engagement in Group:  Good  Engagement in Therapy:  Good  Modes of Intervention:  Activity, Clarification, Education, Problem-solving and Support  Summary of Progress/Problems: Today's group addressed the issue of overcoming obstacles.  Patients were asked to identify their biggest obstacle post d/c that stands in the way of their on-going success, and then problem solve as to how to manage this.  [place] admitted to being hard headed and not asking others for help.  "But I have found this so helpful to be here around others who are willing to admit to their problems out loud, and I believe this will help me going forward."  Asked for referral to a therapist so she can keep seeing someone post d/c.  Said she is not as comfortable in the group setting.  Good, insightful participant.  [name], Odeth Bry B 07/06/2014   4:34 PM

## 2014-07-06 NOTE — Progress Notes (Signed)
D: Pt presents with flat affect and depressed mood. Pt rates depression 1-2 today. Pt states that her depression is starting to improve. Per pt, her meds are working and she would love to go home to her 41 year old daughter who needs her. Pt denies SI/HI/AVH.  No adverse reaction to meds verbalized by pt, pt tolerating her meds well. Pt compliant with taking meds and attending groups.  A: Medications administered as ordered per MD. Verbal support given. Pt encouraged to attend groups. 15 minute checks performed for safety.  R: pt safety maintained at this time.

## 2014-07-06 NOTE — Progress Notes (Signed)
Patient ID: Jody [name], female   DOB: 30-May-1971, 43 y.o.   MRN: [id number] D: Patient pleasant on approach. Pt reports issues with sleep. Pt reports unable to function during the day because of the throbbing headache. Pt reports feeling anxious but feels relieved after medication. Pt denies SI/HI/AVH and pain. Pt attended evening wrap up group and engage in discussion. Cooperative with assessment. No acute distressed noted at this time.   A: Met with pt 1:1. Medications administered as prescribed. Writer encouraged pt to discuss feelings. Pt encouraged to come to staff with any question or concerns.   R: Patient remains safe. She is complaint with medications and denies any adverse reaction. Continue current POC.

## 2014-07-07 MED ORDER — BENZTROPINE MESYLATE 0.5 MG PO TABS: 0.5000 mg | ORAL_TABLET | Freq: Every day | ORAL | Status: DC

## 2014-07-07 MED ORDER — HYDROXYZINE HCL 25 MG PO TABS: 25.0000 mg | ORAL_TABLET | ORAL | Status: DC | PRN

## 2014-07-07 MED ORDER — ZOLPIDEM TARTRATE 5 MG PO TABS: 5.0000 mg | ORAL_TABLET | Freq: Every day | ORAL | Status: DC

## 2014-07-07 MED ORDER — FLUOXETINE HCL 20 MG PO CAPS: 20.0000 mg | ORAL_CAPSULE | Freq: Every day | ORAL | Status: DC

## 2014-07-07 MED ORDER — HALOPERIDOL 5 MG PO TABS: 5.0000 mg | ORAL_TABLET | Freq: Every day | ORAL | Status: DC

## 2014-07-07 NOTE — [facility] Suicide Risk Assessment (Signed)
[facility] INPATIENT:  Family/Significant Other Suicide Prevention Education  Suicide Prevention Education:  Education Completed; No one has been identified by the patient as the family member/significant other with whom the patient will be residing, and identified as the person(s) who will aid the patient in the event of a mental health crisis (suicidal ideations/suicide attempt).  With written consent from the patient, the family member/significant other has been provided the following suicide prevention education, prior to the and/or following the discharge of the patient.  The suicide prevention education provided includes the following:  Suicide risk factors  Suicide prevention and interventions  National Suicide Hotline telephone number  [facility] assessment telephone number  [facility] 911  [facility] and/or [facility]idential Mobile Crisis [facility] telephone number  Request made of family/significant other to:  Remove weapons (e.g., guns, rifles, knives), all items previously/currently identified as safety concern.    Remove drugs/medications (over-the-counter, prescriptions, illicit drugs), all items previously/currently identified as a safety concern.  The family member/significant other verbalizes understanding of the suicide prevention education information provided.  The family member/significant other agrees to remove the items of safety concern listed above. The patient did not endorse SI at the time of admission, nor did the patient c/o SI during the stay here.  SPE not required.   Daryel Geraldorth, Meleana Commerford B 07/07/2014, 9:59 AM

## 2014-07-07 NOTE — [facility] Suicide Risk Assessment (Signed)
   Demographic Factors:  Divorced or widowed, Low socioeconomic status and Unemployed  Total Time spent with patient: 30 minutes  Psychiatric Specialty Exam: Physical Exam  Constitutional: She is oriented to person, place, and time. She appears well-developed and well-nourished.  HENT:  Head: Normocephalic and atraumatic.  Neck: Normal range of motion. Neck supple.  GI: Soft.  Musculoskeletal: Normal range of motion.  Neurological: She is alert and oriented to person, place, and time.  Skin: Skin is warm.  Psychiatric: Her speech is normal and behavior is normal. Judgment normal. Cognition and memory are normal. She expresses no homicidal and no suicidal ideation.    Review of Systems  Constitutional: Negative.   HENT: Negative.   Eyes: Negative.   Respiratory: Negative.   Cardiovascular: Negative.   Gastrointestinal: Negative.   Genitourinary: Negative.   Musculoskeletal: Negative.   Skin: Negative.   Neurological: Negative.   Psychiatric/Behavioral: Negative for depression, suicidal ideas and hallucinations. The patient is not nervous/anxious and does not have insomnia.     Blood pressure 121/93, pulse 85, temperature 98.9 F (37.2 C), temperature source Oral, resp. rate 18, weight 60 kg (132 lb 4.4 oz), last menstrual period 06/15/2014.Body mass index is 22.69 kg/(m^2).  General Appearance: Casual  Eye Contact::  Good  Speech:  Normal Rate  Volume:  Normal  Mood:  Euthymic  Affect:  Congruent  Thought Process:  Goal Directed  Orientation:  Full (Time, Place, and Person)  Thought Content:  WDL  Suicidal Thoughts:  No  Homicidal Thoughts:  No  Memory:  Immediate;   Fair Recent;   Fair Remote;   Fair  Judgement:  Fair  Insight:  Fair  Psychomotor Activity:  Normal  Concentration:  Fair  Recall:  [employer]  Fund of Knowledge:Fair  Language: Fair  Akathisia:  No  Handed:  Right  AIMS (if indicated):     Assets:  Communication Skills Desire for  Improvement Housing Intimacy Social Support Talents/Skills  Sleep:  Number of Hours: 6    Musculoskeletal: Strength & Muscle Tone: within normal limits Gait & Station: normal Patient leans: N/A   Mental Status Per Nursing Assessment::   On Admission:  NA  Current Mental Status by Physician: denies SI/HI/AH/VH  Loss Factors: Decrease in vocational status and Financial problems/change in socioeconomic status  Historical Factors: Impulsivity  Risk Reduction Factors:   Responsible for children under 41 years of age, Sense of responsibility to family, Religious beliefs about death and Positive social support  Continued Clinical Symptoms:  Depression: Improving  Cognitive Features That Contribute To Risk:  Polarized thinking    Suicide Risk:  Minimal: No identifiable suicidal ideation.  Patients presenting with no risk factors but with morbid ruminations; may be classified as minimal risk based on the severity of the depressive symptoms  Discharge Diagnoses:DSM 5   Primary Psychiatric Diagnosis:  Major depressive disorder,recurrent ,severe with psychotic features - improving  Secondary Psychiatric Diagnosis:  Alcohol use disorder,moderate   Non Psychiatric Diagnosis:  Hx of interstitial cystitis      Past Medical History  Diagnosis Date  . Cystitis, interstitial   . Anxiety and depression   . Panic attack     Plan Of Care/Follow-up recommendations:  Activity:  no restrictions  Is patient on multiple antipsychotic therapies at discharge:  No   Has Patient had three or more failed trials of antipsychotic monotherapy by history:  No  Recommended Plan for Multiple Antipsychotic Therapies: NA    Halen Antenucci MD 07/07/2014, 9:18 AM

## 2014-07-07 NOTE — Progress Notes (Signed)
Patient ID: Jody SpruceRosalind L [name], female   DOB: Feb 06, 1971, 43 y.o.   MRN: [id number]  D: Pt was sitting quietly in the dayroom. When asked about her day pt stated, "they had to increase my sleeping medicines".  Pt  Informed the writer that zoloft makes her "gain weight". Stated she was supposed to be put on prozac. Pt stated she's hoping to get section 8 for housing.  A:  Support and encouragement was offered. 15 min checks continued for safety.  R: Pt remains safe.

## 2014-07-07 NOTE — Progress Notes (Signed)
Pt was discharged home today. She denied any S/I H/I or A/V hallucinations.  She was given f/u appointment, rx, sample medications, and hotline info booklet. She voiced understanding to all instructions provided.  She declined the need for smoking cessation materials.  

## 2014-07-07 NOTE — Progress Notes (Signed)
[facility] :  Will you be returning to the same living situation after discharge: Yes,  home At discharge, do you have transportation home?:Yes,  family Do you have the ability to pay for your medications:Yes,  mental health  Release of information consent forms completed and in the chart;  Patient's signature needed at discharge.  Patient to Follow up at: Follow-up Information   Follow up with [facility]. (Go to the walk-in clinic M-F between 8 and 10 AM for your hospital follow up appointment.  this is where you will see a psychiatrist for your medication)    Contact information:   [address]  [place] [336] [phone]      Follow up with [facility]  On 07/13/2014. (10:00AM on Monday with Norton Blizzardracey Anthony)    Contact information:   [address]  [place] [336] [phone]      Patient denies SI/HI:   Yes,  yes    Safety Planning and Suicide Prevention discussed:  Yes,  yes  Daryel Geraldorth, Jody [name] B 07/07/2014, 10:02 AM

## 2014-07-07 NOTE — [facility] Group Notes (Signed)
0900 nursing orientation group   The focus of this group is to educate the patient on the purpose and policies of crisis stabilization and provide a format to answer questions about their admission.  The group details unit policies and expectations of patients while admitted.  Pt was an active participant in group and appropriate in sharing. 

## 2014-07-07 NOTE — Discharge Summary (Signed)
Physician Discharge Summary Note  Patient:  Jody [name] is an 43 y.o., female MRN:  [id number] DOB:  [birth year] Patient phone:  [phone] (home)  Patient address:   [address] [place] [address],  Total Time spent with patient: Greater than 30 minutes  Date of Admission:  07/03/2014 Date of Discharge: 07/06/14  Reason for Admission: Mood stabilization treatment  Discharge Diagnoses: Principal Problem:   Severe major depression with psychotic features   Psychiatric Specialty Exam: Physical Exam  Psychiatric: Her speech is normal and behavior is normal. Judgment and thought content normal. Her mood appears not anxious. Her affect is not angry, not blunt, not labile and not inappropriate. Cognition and memory are normal. She does not exhibit a depressed mood.    Review of Systems  Constitutional: Negative.   HENT: Negative.   Eyes: Negative.   Respiratory: Negative.   Cardiovascular: Negative.   Gastrointestinal: Negative.   Genitourinary: Negative.   Musculoskeletal: Negative.   Skin: Negative.   Neurological: Negative.   Endo/Heme/Allergies: Negative.   Psychiatric/Behavioral: Positive for depression (Stable) and hallucinations (Hx of). Negative for suicidal ideas, memory loss and substance abuse. The patient has insomnia (Stable). The patient is not nervous/anxious.     Blood pressure 121/93, pulse 85, temperature 98.9 F (37.2 C), temperature source Oral, resp. rate 18, weight 60 kg (132 lb 4.4 oz), last menstrual period 06/15/2014.Body mass index is 22.69 kg/(m^2).   General Appearance: Casual   Eye Contact:: Good   Speech: Normal Rate   Volume: Normal   Mood: Euthymic   Affect: Congruent   Thought Process: Goal Directed   Orientation: Full (Time, Place, and Person)   Thought Content: WDL   Suicidal Thoughts: No   Homicidal Thoughts: No   Memory: Immediate; Fair  Recent; Fair  Remote; Fair   Judgement: Fair   Insight: Fair   Psychomotor  Activity: Normal   Concentration: Fair   Recall: [employer] of Knowledge:Fair   Language: Fair   Akathisia: No   Handed: Right   AIMS (if indicated):   Assets: Communication Skills  Desire for Improvement  Housing  Intimacy  Social Support  Talents/Skills   Sleep: Number of Hours: 6    Past Psychiatric History: Diagnosis: Severe major depression with psychotic features  Hospitalizations: [facility] adult unit  Outpatient Care:  [facility]  Substance Abuse Care: See H&P  Self-Mutilation: See H&P  Suicidal Attempts: See H&P  Violent Behaviors: NA   Musculoskeletal: Strength & Muscle Tone: within normal limits Gait & Station: normal Patient leans: N/A   Diagnosis: DSM 5 Primary Psychiatric Diagnosis:  Major depressive disorder,recurrent ,severe with psychotic features - improving   Secondary Psychiatric Diagnosis:  Alcohol use disorder,moderate   Non Psychiatric Diagnosis:  Hx of interstitial cystitis       Level of Care:  OP  Hospital Course:  Evaluated patient today who was admitted from [facility] ER for major depressive disorder. Patient reports poor sleep and appetite. She also stated that she has been sad and depressed for 4 months since she lost her job due to lack of transportation. She also states that she was seen at [facility] outpatient psychiatric facility last year for depression and suicidal thoughts. She stopped taking her medications as soon as she started feeling better and she also felt that her Zoloft was making her add weight.   After admission assessment/evaluation, it was determined based on her symptoms that Jody [name] will need medication management to re-stabilize her depressed  mood. She was then medicated and discharged on Prozac 20 mg for depression, haldol 5 mg for paranoia, Hydroxyzine 25 mg for anxiety, Cogentin 0.5 mg for EPS and Ambien 5 mg for insomnia. Trey was also enrolled and participated in the group counseling sessions being offered and held  within this unit. She learned coping skills. Other than minor aches and pains, she presented no other serious medical issues that required treatments. She tolerated her treatment regimen without any significant adverse effects.  Jody [name]'s symptoms responded well to her treatment regimen. This is evidenced by her reports of improved mood and absence of suicidal ideations. She is encouraged to continue to take her medications as prescribed and not stop even if she starts to feel better. She is encouraged to talk it over with her outpatient psychiatrists any particular questions she may have about any of her medicines. She is currently being discharged to continue follow-up care at [facility] clinic for medication management and at the [facility] health Associate with Norton Blizzard for counseling services. She received a 14 days worth, supply samples of her [facility] discharge medications. She left [facility] with all personal belongings in no distress. Transportation per family.  Consults:  psychiatry  Significant Diagnostic Studies:  labs: CBC with diff, CMP, UDS, toxicology tests, U/A  Discharge Vitals:   Blood pressure 121/93, pulse 85, temperature 98.9 F (37.2 C), temperature source Oral, resp. rate 18, weight 60 kg (132 lb 4.4 oz), last menstrual period 06/15/2014. Body mass index is 22.69 kg/(m^2). Lab Results:   No results found for this or any previous visit (from the past 72 hour(s)).  Physical Findings: AIMS: Facial and Oral Movements Muscles of Facial Expression: None, normal Lips and Perioral Area: None, normal Jaw: None, normal Tongue: None, normal,Extremity Movements Upper (arms, wrists, hands, fingers): None, normal Lower (legs, knees, ankles, toes): None, normal, Trunk Movements Neck, shoulders, hips: None, normal, Overall Severity Severity of abnormal movements (highest score from questions above): None, normal Incapacitation due to abnormal movements: None, normal Patient's awareness of  abnormal movements (rate only patient's report): No Awareness, Dental Status Current problems with teeth and/or dentures?: No Does patient usually wear dentures?: No  CIWA:    COWS:     Psychiatric Specialty Exam: See Psychiatric Specialty Exam and Suicide Risk Assessment completed by Attending Physician prior to discharge.  Discharge destination:  Home  Is patient on multiple antipsychotic therapies at discharge:  No   Has Patient had three or more failed trials of antipsychotic monotherapy by history:  No  Recommended Plan for Multiple Antipsychotic Therapies: NA    Medication List    STOP taking these medications       erythromycin 333 MG EC tablet  Commonly known as:  ERY-TAB     pantoprazole 20 MG tablet  Commonly known as:  PROTONIX      TAKE these medications     Indication   benztropine 0.5 MG tablet  Commonly known as:  COGENTIN  Take 1 tablet (0.5 mg total) by mouth at bedtime. For prevention of drug induced involuntary movements   Indication:  Extrapyramidal Reaction caused by Medications     FLUoxetine 20 MG capsule  Commonly known as:  PROZAC  Take 1 capsule (20 mg total) by mouth daily. For depression   Indication:  Excessive Use of Alcohol, Major Depressive Disorder     haloperidol 5 MG tablet  Commonly known as:  HALDOL  Take 1 tablet (5 mg total) by mouth at bedtime. For mood control  Indication:  Mood control     hydrOXYzine 25 MG tablet  Commonly known as:  ATARAX/VISTARIL  Take 1 tablet (25 mg total) by mouth every 4 (four) hours as needed for anxiety (Sleep).   Indication:  Tension, Anxiety     zolpidem 5 MG tablet  Commonly known as:  AMBIEN  Take 1 tablet (5 mg total) by mouth at bedtime. For insomnia   Indication:  Trouble Sleeping           Follow-up Information   Follow up with [facility]. (Go to the walk-in clinic M-F between 8 and 10 AM for your hospital follow up appointment.  this is where you will see a psychiatrist for your  medication)    Contact information:   [address]  [place] [336] [phone]      [facility] with [facility]  On 07/13/2014. (10:00AM on Monday with Norton Blizzardracey Anthony)    Contact information:   [address]  [place] [336] [phone]     Follow-up recommendations:  Activity:  As tolerated Diet: As recommended by your primary care doctor. Keep all scheduled follow-up appointments as recommended.  Comments: Take all your medications as prescribed by your mental healthcare provider. Report any adverse effects and or reactions from your medicines to your outpatient provider promptly. Patient is instructed and cautioned to not engage in alcohol and or illegal drug use while on prescription medicines. In the event of worsening symptoms, patient is instructed to call the crisis hotline, 911 and or go to the nearest ED for appropriate evaluation and treatment of symptoms. Follow-up with your primary care provider for your other medical issues, concerns and or health care needs.   Total Discharge Time:  Greater than 30 minutes.  Signed: Armandina StammerNwoko, Agnes I, PMHNP-BC 07/07/2014, 10:10 AM    Patient was seen face to face for psychiatric evaluation, suicide risk assessment and case discussed with treatment team and NP and made appropriate disposition plans. Reviewed the information documented and agree with the treatment plan.    Jomarie LongsSaramma Ruba Outen ,MD Attending Psychiatrist  [facility]

## 2014-07-07 NOTE — [facility] Group Notes (Signed)
[facility] LCSW Group Therapy  07/07/2014 1:27 PM  Type of Therapy:  Group Therapy  Participation Level:  Active  Participation Quality:  Attentive  Affect:  Depressed and Flat  Cognitive:  Oriented  Insight:  Improving  Engagement in Therapy:  Improving  Modes of Intervention:  Confrontation, Discussion, Education, Exploration, Limit-setting, Problem-solving, Rapport Building, Socialization and Support  Summary of Progress/Problems: Feelings around Diagnosis-Patients were encouraged to process their feelings surrounding their mental health diagnosis, discuss how they view their diagnosis, and identify the positive and negative aspects of having a mental health diagnosis. Jody [name] was attentive and engaged during today's processing group. She presented with depressed mood and flat affect but was able to contribute to group discussion. Jody [name] shared that she did not know her MH diagnosis yet but was anxious to find out in order to "have that information about myself so I can know how to proceed." Jody [name] shared that when she d/ces today, she plans to make sure to take her meds as prescribed and seek counseling. She was able to identify reasons why taking care of herself by doing these things would help her cope with her diagnosis and symptoms.    Smart, Leobardo Granlund LCSWA  07/07/2014, 1:27 PM

## 2014-07-10 NOTE — Progress Notes (Signed)
Patient Discharge Instructions:  After Visit Summary (AVS):   Faxed to:  07/10/14 Discharge Summary Note:   Faxed to:  07/10/14 Psychiatric Admission Assessment Note:   Faxed to:  07/10/14 Suicide Risk Assessment - Discharge Assessment:   Faxed to:  07/10/14 Faxed/Sent to the Next Level Care provider:  07/10/14 Faxed to [facility] @ [phone] [facility] to [facility] @ [phone]  Jerelene ReddenSheena E [place], 07/10/2014, 1:34 PM

## 2014-09-12 ENCOUNTER — Encounter (HOSPITAL_COMMUNITY): Payer: Self-pay | Admitting: *Deleted

## 2014-09-12 ENCOUNTER — Emergency Department (HOSPITAL_COMMUNITY)
Admission: EM | Admit: 2014-09-12 | Discharge: 2014-09-12 | Disposition: A | Discharge status: Other Acute Inpatient Hospital | Payer: Federal, State, Local not specified - Other | Attending: Emergency Medicine | Admitting: Emergency Medicine

## 2014-09-12 ENCOUNTER — Inpatient Hospital Stay (HOSPITAL_COMMUNITY)
Admission: AD | Admit: 2014-09-12 | Discharge: 2014-09-19 | DRG: 885 | Disposition: A | Discharge status: Home / Self Care | Payer: Medicaid Other | Source: Intra-hospital | Attending: Psychiatry | Admitting: Psychiatry

## 2014-09-12 ENCOUNTER — Encounter (HOSPITAL_COMMUNITY): Payer: Self-pay | Admitting: Emergency Medicine

## 2014-09-12 DIAGNOSIS — Z79899 Other long term (current) drug therapy: Secondary | ICD-10-CM | POA: Insufficient documentation

## 2014-09-12 DIAGNOSIS — F209 Schizophrenia, unspecified: Secondary | ICD-10-CM | POA: Diagnosis present

## 2014-09-12 DIAGNOSIS — F29 Unspecified psychosis not due to a substance or known physiological condition: Secondary | ICD-10-CM | POA: Diagnosis present

## 2014-09-12 DIAGNOSIS — F1014 Alcohol abuse with alcohol-induced mood disorder: Secondary | ICD-10-CM | POA: Diagnosis present

## 2014-09-12 DIAGNOSIS — Z720 Tobacco use: Secondary | ICD-10-CM | POA: Diagnosis not present

## 2014-09-12 DIAGNOSIS — N3010 Interstitial cystitis (chronic) without hematuria: Secondary | ICD-10-CM | POA: Diagnosis not present

## 2014-09-12 DIAGNOSIS — F329 Major depressive disorder, single episode, unspecified: Secondary | ICD-10-CM | POA: Diagnosis not present

## 2014-09-12 DIAGNOSIS — F419 Anxiety disorder, unspecified: Secondary | ICD-10-CM | POA: Insufficient documentation

## 2014-09-12 DIAGNOSIS — F333 Major depressive disorder, recurrent, severe with psychotic symptoms: Principal | ICD-10-CM | POA: Diagnosis present

## 2014-09-12 DIAGNOSIS — Z880 Allergy status to penicillin: Secondary | ICD-10-CM | POA: Diagnosis not present

## 2014-09-12 DIAGNOSIS — Z9119 Patient's noncompliance with other medical treatment and regimen: Secondary | ICD-10-CM | POA: Diagnosis present

## 2014-09-12 DIAGNOSIS — R440 Auditory hallucinations: Secondary | ICD-10-CM | POA: Diagnosis present

## 2014-09-12 DIAGNOSIS — F1010 Alcohol abuse, uncomplicated: Secondary | ICD-10-CM | POA: Diagnosis present

## 2014-09-12 DIAGNOSIS — F200 Paranoid schizophrenia: Secondary | ICD-10-CM | POA: Diagnosis not present

## 2014-09-12 DIAGNOSIS — Z3202 Encounter for pregnancy test, result negative: Secondary | ICD-10-CM | POA: Insufficient documentation

## 2014-09-12 DIAGNOSIS — F17210 Nicotine dependence, cigarettes, uncomplicated: Secondary | ICD-10-CM | POA: Diagnosis present

## 2014-09-12 DIAGNOSIS — F323 Major depressive disorder, single episode, severe with psychotic features: Secondary | ICD-10-CM | POA: Diagnosis present

## 2014-09-12 LAB — POC URINE PREG, ED: Preg Test, Ur: NEGATIVE

## 2014-09-12 LAB — COMPREHENSIVE METABOLIC PANEL
ALBUMIN: 4.6 g/dL (ref 3.5–5.2)
ALT: 11 U/L (ref 0–35)
ANION GAP: 15 (ref 5–15)
AST: 16 U/L (ref 0–37)
Alkaline Phosphatase: 65 U/L (ref 39–117)
BUN: 4 mg/dL — AB (ref 6–23)
CALCIUM: 9.7 mg/dL (ref 8.4–10.5)
CO2: 26 mEq/L (ref 19–32)
CREATININE: 0.76 mg/dL (ref 0.50–1.10)
Chloride: 99 mEq/L (ref 96–112)
GFR calc Af Amer: >90 mL/min (ref >90)
GFR calc non Af Amer: >90 mL/min (ref >90)
Glucose, Bld: 90 mg/dL (ref 70–99)
POTASSIUM: 4 meq/L (ref 3.7–5.3)
Sodium: 140 mEq/L (ref 137–147)
TOTAL PROTEIN: 7.9 g/dL (ref 6.0–8.3)
Total Bilirubin: 0.5 mg/dL (ref 0.3–1.2)

## 2014-09-12 LAB — CBC WITH DIFFERENTIAL/PLATELET
BASOS PCT: 1 % (ref 0–1)
Basophils Absolute: 0 10*3/uL (ref 0.0–0.1)
EOS ABS: 0 10*3/uL (ref 0.0–0.7)
Eosinophils Relative: 0 % (ref 0–5)
HCT: 42.2 % (ref 36.0–46.0)
HEMOGLOBIN: 13.2 g/dL (ref 12.0–15.0)
Lymphocytes Relative: 33 % (ref 12–46)
Lymphs Abs: 1.1 10*3/uL (ref 0.7–4.0)
MCH: 23.6 pg — AB (ref 26.0–34.0)
MCHC: 31.3 g/dL (ref 30.0–36.0)
MCV: 75.4 fL — ABNORMAL LOW (ref 78.0–100.0)
MONO ABS: 0.3 10*3/uL (ref 0.1–1.0)
Monocytes Relative: 9 % (ref 3–12)
NEUTROS PCT: 57 % (ref 43–77)
Neutro Abs: 1.9 10*3/uL (ref 1.7–7.7)
Platelets: 288 10*3/uL (ref 150–400)
RBC: 5.6 MIL/uL — ABNORMAL HIGH (ref 3.87–5.11)
RDW: 14.7 % (ref 11.5–15.5)
WBC: 3.4 10*3/uL — ABNORMAL LOW (ref 4.0–10.5)

## 2014-09-12 LAB — RAPID URINE DRUG SCREEN, HOSP PERFORMED
Amphetamines: NOT DETECTED
Barbiturates: NOT DETECTED
Benzodiazepines: NOT DETECTED
COCAINE: NOT DETECTED
Opiates: NOT DETECTED
Tetrahydrocannabinol: NOT DETECTED

## 2014-09-12 LAB — ETHANOL

## 2014-09-12 MED ORDER — ONDANSETRON HCL 4 MG PO TABS: 4.0000 mg | ORAL_TABLET | Freq: Three times a day (TID) | ORAL | Status: DC | PRN

## 2014-09-12 MED ORDER — ZOLPIDEM TARTRATE 5 MG PO TABS
5.0000 mg | ORAL_TABLET | Freq: Every evening | ORAL | Status: DC | PRN
  Administered 2014-09-15 – 2014-09-17 (×2): 5 mg via ORAL
  Filled 2014-09-12 (×2): qty 1

## 2014-09-12 MED ORDER — BENZTROPINE MESYLATE 0.5 MG PO TABS
0.5000 mg | ORAL_TABLET | Freq: Every day | ORAL | Status: DC
  Administered 2014-09-13 – 2014-09-18 (×6): 0.5 mg via ORAL
  Filled 2014-09-12 (×10): qty 1

## 2014-09-12 MED ORDER — HALOPERIDOL 5 MG PO TABS
5.0000 mg | ORAL_TABLET | Freq: Every day | ORAL | Status: DC
  Filled 2014-09-12 (×3): qty 1

## 2014-09-12 MED ORDER — NICOTINE 21 MG/24HR TD PT24
21.0000 mg | MEDICATED_PATCH | Freq: Every day | TRANSDERMAL | Status: DC
  Filled 2014-09-12: qty 1

## 2014-09-12 MED ORDER — IBUPROFEN 400 MG PO TABS: 600.0000 mg | ORAL_TABLET | Freq: Three times a day (TID) | ORAL | Status: DC | PRN

## 2014-09-12 MED ORDER — LORAZEPAM 1 MG PO TABS
1.0000 mg | ORAL_TABLET | Freq: Three times a day (TID) | ORAL | Status: DC | PRN
  Filled 2014-09-12 (×2): qty 1

## 2014-09-12 MED ORDER — NICOTINE 14 MG/24HR TD PT24
14.0000 mg | MEDICATED_PATCH | Freq: Every day | TRANSDERMAL | Status: DC
  Administered 2014-09-13 – 2014-09-19 (×7): 14 mg via TRANSDERMAL
  Filled 2014-09-12 (×10): qty 1

## 2014-09-12 MED ORDER — MAGNESIUM HYDROXIDE 400 MG/5ML PO SUSP: 30.0000 mL | Freq: Every day | ORAL | Status: DC | PRN

## 2014-09-12 MED ORDER — LORAZEPAM 1 MG PO TABS
1.0000 mg | ORAL_TABLET | Freq: Three times a day (TID) | ORAL | Status: DC | PRN
  Administered 2014-09-12: 1 mg via ORAL
  Filled 2014-09-12: qty 1

## 2014-09-12 MED ORDER — ALUM & MAG HYDROXIDE-SIMETH 200-200-20 MG/5ML PO SUSP: 30.0000 mL | ORAL | Status: DC | PRN

## 2014-09-12 MED ORDER — IBUPROFEN 600 MG PO TABS: 600.0000 mg | ORAL_TABLET | Freq: Three times a day (TID) | ORAL | Status: DC | PRN

## 2014-09-12 MED ORDER — LORAZEPAM 1 MG PO TABS: 0.0000 mg | ORAL_TABLET | Freq: Four times a day (QID) | ORAL | Status: DC

## 2014-09-12 MED ORDER — ZOLPIDEM TARTRATE 5 MG PO TABS: 5.0000 mg | ORAL_TABLET | Freq: Every evening | ORAL | Status: DC | PRN

## 2014-09-12 MED ORDER — ACETAMINOPHEN 325 MG PO TABS: 650.0000 mg | ORAL_TABLET | ORAL | Status: DC | PRN

## 2014-09-12 MED ORDER — FLUOXETINE HCL 20 MG PO CAPS
20.0000 mg | ORAL_CAPSULE | Freq: Every day | ORAL | Status: DC
  Administered 2014-09-13 – 2014-09-15 (×3): 20 mg via ORAL
  Filled 2014-09-12 (×5): qty 1

## 2014-09-12 MED ORDER — HYDROXYZINE HCL 25 MG PO TABS
25.0000 mg | ORAL_TABLET | ORAL | Status: DC | PRN
  Administered 2014-09-12 – 2014-09-18 (×2): 25 mg via ORAL
  Filled 2014-09-12: qty 1
  Filled 2014-09-12: qty 15
  Filled 2014-09-12: qty 1

## 2014-09-12 MED ORDER — LORAZEPAM 1 MG PO TABS: 0.0000 mg | ORAL_TABLET | Freq: Two times a day (BID) | ORAL | Status: DC

## 2014-09-12 MED ORDER — NICOTINE 21 MG/24HR TD PT24
21.0000 mg | MEDICATED_PATCH | Freq: Every day | TRANSDERMAL | Status: DC
  Administered 2014-09-12: 21 mg via TRANSDERMAL
  Filled 2014-09-12: qty 1

## 2014-09-12 MED ORDER — LORAZEPAM 1 MG PO TABS
0.0000 mg | ORAL_TABLET | Freq: Four times a day (QID) | ORAL | Status: DC
  Administered 2014-09-12: 1 mg via ORAL
  Filled 2014-09-12: qty 1

## 2014-09-12 MED ORDER — ACETAMINOPHEN 325 MG PO TABS
650.0000 mg | ORAL_TABLET | Freq: Four times a day (QID) | ORAL | Status: DC | PRN
  Administered 2014-09-16: 650 mg via ORAL
  Filled 2014-09-12: qty 2
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
## 2014-09-12 NOTE — ED Notes (Signed)
Telepsych being completed with Burnetta SabinIvey

## 2014-09-12 NOTE — ED Notes (Signed)
DR Juleen ChinaKOHUT AWARE PT ACCEPTED TO [facility].

## 2014-09-12 NOTE — ED Notes (Signed)
PER ERIC, AC, [facility], PT ACCEPTED 505-1 DR EAPPEN

## 2014-09-12 NOTE — ED Notes (Addendum)
Pt does not want to have a sitter-- explained that it was for her safety and policy -- pt ok with sitter at the doorway at present.

## 2014-09-12 NOTE — ED Notes (Signed)
Regular lunch tray ordered 
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
## 2014-09-12 NOTE — BH Assessment (Signed)
1505:  Per Tanna SavoyEric, AC; Patient can be admitted to 505-1 bed.  [facility] Charge Nurse [place] [facility] and that voluntary paperwork needs to be completed prior to calling [employer].

## 2014-09-12 NOTE — ED Notes (Signed)
To ED frfom home with sister at bedside with patient stating that she has been off her psych meds for approx 2 weeks, [facility] -- sister states pt signed herself out prior to ending of treatment. Pt was found running in neighbor's yard, has been hearing voices, command voices-- states tell her to go outside, denies homicidal/suicidal ideas, feels unsafe at home, paranoid toward surroundings.

## 2014-09-12 NOTE — ED Notes (Signed)
PT SIGNED CONSENT FORMS - FAXED TO [facility]. 

## 2014-09-12 NOTE — Progress Notes (Signed)
Patient ID: Jody SpruceRosalind L Woldt, female   DOB: Mar 07, 1971, 43 y.o.   MRN: [id number] Nursing admission note:  Patient brought in by sister.  She reports that she was commanded to run down the street into a neighbor's yard last night per ED note.  Patient reports hearing command voices.  She has not been compliant with her meds for several months and she was also drinking.  Patient drank a 6 pk of beer. Patient denies any drug use.  Her UDS was negative.  She reports no alcohol abuse, reporting 18 oz beer daily.  She reports depressive symptoms such as decreased sleep, appetite, crying spells.  She reports only drinking when she is not taking her medications.  Patient denies SI/HI/AVH [facility].  Her last admission was 07/2014.  [facility]  She does not have a PCP/Psychiatrist currently.  She also reports panic attack, her last one this morning.  Patient has no pertinent medical issues.  Patient was given food/fluids and oriented to unit.

## 2014-09-12 NOTE — BH Assessment (Signed)
1015:  Consulted with Dr. Jeraldine LootsLockwood about the Patient.  1018:  Scheduled tele-assessment with Dee.  1020 to 1046:  Complete tele-assessment.  1048:  Consult with Dahlia ByesJosephine Onuoha, NP about Patient's disposition.  Per Welford Rochenuoha, NP; PT meets inpatient criteria seek outside placement.  1050:  Dr. Jeraldine LootsLockwood informed of Patient's disposition.

## 2014-09-12 NOTE — Progress Notes (Signed)
The focus of this group is to help patients review their daily goal of treatment and discuss progress on daily workbooks. Pt did not attend the evening group. 

## 2014-09-13 ENCOUNTER — Encounter (HOSPITAL_COMMUNITY): Payer: Self-pay | Admitting: Psychiatry

## 2014-09-13 DIAGNOSIS — F209 Schizophrenia, unspecified: Secondary | ICD-10-CM

## 2014-09-13 DIAGNOSIS — F1010 Alcohol abuse, uncomplicated: Secondary | ICD-10-CM | POA: Diagnosis present

## 2014-09-13 DIAGNOSIS — F29 Unspecified psychosis not due to a substance or known physiological condition: Secondary | ICD-10-CM | POA: Diagnosis present

## 2014-09-13 MED ORDER — OLANZAPINE 5 MG PO TBDP
5.0000 mg | ORAL_TABLET | Freq: Two times a day (BID) | ORAL | Status: DC
  Administered 2014-09-13 – 2014-09-18 (×10): 5 mg via ORAL
  Filled 2014-09-13 (×13): qty 1

## 2014-09-13 MED ORDER — OLANZAPINE 10 MG PO TBDP
10.0000 mg | ORAL_TABLET | Freq: Every day | ORAL | Status: DC
  Administered 2014-09-13 – 2014-09-17 (×5): 10 mg via ORAL
  Filled 2014-09-13 (×7): qty 1

## 2014-09-13 MED ORDER — LORAZEPAM 1 MG PO TABS
1.0000 mg | ORAL_TABLET | Freq: Every day | ORAL | Status: AC
  Administered 2014-09-16: 1 mg via ORAL
  Filled 2014-09-13: qty 1

## 2014-09-13 MED ORDER — LORAZEPAM 1 MG PO TABS
1.0000 mg | ORAL_TABLET | Freq: Three times a day (TID) | ORAL | Status: AC
  Administered 2014-09-14 (×3): 1 mg via ORAL
  Filled 2014-09-13 (×3): qty 1

## 2014-09-13 MED ORDER — LOPERAMIDE HCL 2 MG PO CAPS: 2.0000 mg | ORAL_CAPSULE | ORAL | Status: AC | PRN

## 2014-09-13 MED ORDER — LORAZEPAM 1 MG PO TABS
1.0000 mg | ORAL_TABLET | Freq: Two times a day (BID) | ORAL | Status: AC
  Administered 2014-09-15 (×2): 1 mg via ORAL
  Filled 2014-09-13 (×2): qty 1

## 2014-09-13 MED ORDER — OLANZAPINE 10 MG PO TBDP
10.0000 mg | ORAL_TABLET | Freq: Once | ORAL | Status: AC
  Administered 2014-09-13: 10 mg via ORAL
  Filled 2014-09-13 (×2): qty 1

## 2014-09-13 MED ORDER — LORAZEPAM 1 MG PO TABS
1.0000 mg | ORAL_TABLET | Freq: Four times a day (QID) | ORAL | Status: AC
  Administered 2014-09-13 (×4): 1 mg via ORAL
  Filled 2014-09-13 (×3): qty 1

## 2014-09-13 MED ORDER — OLANZAPINE 5 MG PO TBDP
5.0000 mg | ORAL_TABLET | Freq: Two times a day (BID) | ORAL | Status: DC
  Filled 2014-09-13 (×3): qty 1

## 2014-09-13 MED ORDER — VITAMIN B-1 100 MG PO TABS
100.0000 mg | ORAL_TABLET | Freq: Every day | ORAL | Status: DC
  Administered 2014-09-14 – 2014-09-19 (×6): 100 mg via ORAL
  Filled 2014-09-13 (×8): qty 1

## 2014-09-13 MED ORDER — ADULT MULTIVITAMIN W/MINERALS CH
1.0000 | ORAL_TABLET | Freq: Every day | ORAL | Status: DC
  Administered 2014-09-13 – 2014-09-19 (×7): 1 via ORAL
  Filled 2014-09-13 (×9): qty 1

## 2014-09-13 NOTE — [facility] Suicide Risk Assessment (Signed)
Suicide Risk Assessment  Admission Assessment     Nursing information obtained from:    Demographic factors:    Current Mental Status:    Loss Factors:    Historical Factors:    Risk Reduction Factors:    Total Time spent with patient: 30 minutes  CLINICAL FACTORS:   Depression:   Comorbid alcohol abuse/dependence Severe Currently Psychotic  Psychiatric Specialty Exam:     Blood pressure 119/71, pulse 100, temperature 98.7 F (37.1 C), temperature source Oral, resp. rate 18, height 5\' 4"  (1.626 m), weight 70.761 kg (156 lb), last menstrual period 09/01/2014, SpO2 100 %.Body mass index is 26.76 kg/(m^2).  General Appearance: Disheveled  Eye Contact::  Minimal  Speech:  Blocked  Volume:  Decreased  Mood:  Anxious and Dysphoric  Affect:  Restricted  Thought Process:  Disorganized  Orientation:  Other:  could not assess  Thought Content:  Delusions  Suicidal Thoughts:  No  Homicidal Thoughts:  No  Memory:  Immediate;   Poor Recent;   Poor Remote;   Poor  Judgement:  Impaired  Insight:  Lacking  Psychomotor Activity:  Restlessness  Concentration:  Poor  Recall:  Poor  Fund of Knowledge:NA  Language: Poor  Akathisia:  No  Handed:    AIMS (if indicated):     Assets:  Social Support  Sleep:  Number of Hours: 6.75   Musculoskeletal: Strength & Muscle Tone: within normal limits Gait & Station: normal Patient leans: N/A  COGNITIVE FEATURES THAT CONTRIBUTE TO RISK:  Loss of executive function    SUICIDE RISK:   Moderate:   PLAN OF CARE: Supportive approach/evaluate further                               Improve reality testing                               Optimize response to psychotropics                               Will use Zyprexa Zydis to address the psychosis and Ativan for detox and the                                agitation  I certify that inpatient services furnished can reasonably be expected to improve the patient's condition.  Lanier Felty  A 09/13/2014, 5:18 PM

## 2014-09-13 NOTE — [facility] Counselor (Signed)
CSW attempted to complete PSA with pt in the presence of MHT and pt refused to speak or even make eye contact with CSW. Pt wanted to sleep and reported "I am talking with Jesus Christ" CSW advised pt the importance of PSA and pt stated she was not going to complete PSA.   Calton DachWendy F. Laurelyn Terrero, MSW, [facility] 09/13/2014 4:23 PM
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
## 2014-09-13 NOTE — Progress Notes (Signed)
Patient ID: Jody SpruceRosalind L [name], female   DOB: 25-Mar-1971, 43 y.o.   MRN: [id number] Late entry for 09:11: she is in her room praying and looking out the window saying she was going to fly to the Lord.  She became  angry with her roommate telling her she was not a [place], that she was going to go to hell because she did not believe. Roommate moved to bed in seclusion area (door is opened).he was given ativan prn for agitation given at  09:11 and [facility] and it was given 09:41. She is asleep at this time resperation even non labored.

## 2014-09-13 NOTE — [facility] Group Notes (Signed)
0900 nursing orientation   The focus of this group is to educate the patient on the purpose and policies of crisis stabilization and provide a format to answer questions about their admission.  The group details unit policies and expectations of patients while admitted.  Pt did not attend she was in her bed asleep.

## 2014-09-13 NOTE — Plan of Care (Signed)
Problem: Diagnosis: Increased Risk For Suicide Attempt Goal: STG-Patient Will Comply With Medication Regime Outcome: Not Progressing Pt cheeked HS medications on 09/12/14.  When a mouth check was performed, pt spit medications out, refusing them.  Despite encouragement from staff, pt continued to refuse medication.
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
## 2014-09-13 NOTE — Progress Notes (Signed)
Patient ID: Geoffery SpruceRosalind L Keithley, female   DOB: 11/17/1970, 43 y.o.   MRN: [id number] She has taken her medication and has eat at least 1/2 of supper. She has no c/o withdrawals and denies hearing or seeing anything. Stated that she just felt weak and tires and just wanted to sleep. I explained to her that the new medication was making her sleepy that she would adjust to the dose.  She wakes up when spoken to , she will answers your questions now with out hesitation. Speech is clear

## 2014-09-13 NOTE — Progress Notes (Signed)
Patient ID: Geoffery SpruceRosalind L Sinyard, female   DOB: November 14, 1970, 43 y.o.   MRN: [id number] She did take her 12 noon medication and said that she would eat lunch but as of now she has not.  She has been laying in bed  Talking quietly to herself. She has been calm will answer questions  if you let her think about the question.  She denies having any discomfort.

## 2014-09-14 DIAGNOSIS — R45851 Suicidal ideations: Secondary | ICD-10-CM

## 2014-09-14 DIAGNOSIS — F10230 Alcohol dependence with withdrawal, uncomplicated: Secondary | ICD-10-CM

## 2014-09-14 DIAGNOSIS — Z9114 Patient's other noncompliance with medication regimen: Secondary | ICD-10-CM

## 2014-09-14 NOTE — [facility] Group Notes (Signed)
[facility] Note   09/14/2014 10:32 AM  Participation Quality:  Invited.  Chose to not attend.    Daryel GeraldNorth, Kaela Beitz B

## 2014-09-14 NOTE — Progress Notes (Signed)
The focus of this group is to help patients review their daily goal of treatment and discuss progress on daily workbooks. Pt did not attend the evening group. 

## 2014-09-14 NOTE — Progress Notes (Signed)
Psychoeducational Group Note  Date:  09/14/2014 Time: 1206  Group Topic/Focus:  Developing a Wellness Toolbox:   The focus of this group is to help patients develop a "wellness toolbox" with skills and strategies to promote recovery upon discharge.  Participation Level: Did Not Attend  Participation Quality:  Not Applicable  Affect:  Not Applicable  Cognitive:  Not Applicable  Insight:  Not Applicable  Engagement in Group: Not Applicable  Additional Comments:  Pt refused to attend group this morning.  Nekesha Font E 09/14/2014, 12:06 PM

## 2014-09-14 NOTE — Progress Notes (Signed)
Nutrition Brief Note  Patient identified on the Malnutrition Screening Tool (MST) Report  Pt with recent weight gain.   Wt Readings from Last 15 Encounters:  09/12/14 156 lb (70.761 kg)  09/12/14 132 lb (59.875 kg)  07/04/14 132 lb 4.4 oz (60 kg)  06/01/13 140 lb (63.504 kg)    Body mass index is 26.76 kg/(m^2). Patient meets criteria for overweight based on current BMI.   Diet Order: Diet regular Pt is also offered choice of unit snacks mid-morning and mid-afternoon.  Pt is eating as desired.   Labs and medications reviewed.   No nutrition interventions warranted at this time. If nutrition issues arise, please consult RD.   Tilda FrancoLindsey Chelci [place], MS, RD, LDN Pager: [phone] After Hours Pager: [phone]

## 2014-09-14 NOTE — Progress Notes (Signed)
D:  Patient stayed in bed this morning and did not want her medications.  Later patient got out of bed and took her medications after talking to MD.  Patient returned to bed.  Has not attended groups today.  Went to dining room for lunch.  Took her morning medication without cheeking meds.  Patient opened her mouth for nurse to check after each medication.   A:   Emotional support and encouragement given patient. R:  Safety maintained with 15 minute checks. Patient has not completed self inventory form.

## 2014-09-14 NOTE — Progress Notes (Signed)
Patient's mother requested assistance from SW to help patient get medicaid or disability benefits or any kind of government assistance available to her.

## 2014-09-14 NOTE — Tx Team (Signed)
Interdisciplinary Treatment Plan Update (Adult)  Date: 09/14/2014 Time Reviewed: 5:00 PM  Progress in Treatment:  Attending groups: No Participating in groups:  No Taking medication as prescribed: Yes  Tolerating medication: Yes  Family/Significant othe contact made:No, Not yet  Patient understands diagnosis: Yes AEB seeking help for AH Discussing patient identified problems/goals with staff: Yes  Medical problems stabilized or resolved: Yes  Denies suicidal/homicidal ideation: Yes  Patient has not harmed self or Others: Yes  New problem(s) identified: N/A Discharge Plan or Barriers: Pt plans to return home and receive outpatient services.   Additional comments:Jody [name] is a 43 yo female [facility] reports that she hears voices telling her to run down street into her neighbor's yard. She reports frequently experiencing AVH, panic attacks. Although when questioned, she does deny SI/HI/AVH. Since job loss this past June, the voices worsened and also her depression. Patient states that God tells her what to do. She is paranoid about food being poisoned. She was agreeable to having one of the staff RN's to get her food. "God told me that I can trust you to get my food". She is non communicative and has long pauses in between responses. She admits to drinking beer on a regular basis.  Meds started:  Zyprexa and Prozac   Pt and CSW Intern reviewed pt's identified goals and treatment plan. Pt verbalized understanding and agreed to treatment plan  Reason for Continuation of Hospitalization: Medication stabilization AH  Depression   Estimated length of stay: 4-5 days   Attendees:  Patient:  09/14/2014 5:00 PM   Family:  12/14/20155:00 PM   Physician: Dr. Elna BreslowEappen MD  12/14/20155:00 PM  Nursing: Orlie PollenMarion Friedman, RN 09/14/2014 5:00 PM  Clinical Social Worker: Daryel Geraldodney Brigette Hopfer, LCSW  12/14/20155:00 PM  Clinical Social Worker: Charleston Ropesandace Hyatt, CSW Intern  12/14/20155:00 PM  Other:  12/14/20155:00 PM  Other:  12/14/20155:00 PM  Other:  12/14/20155:00 PM  Scribe for Treatment Team:  Charleston Ropesandace Hyatt, CSW Intern 09/14/2014 5:00 PM

## 2014-09-14 NOTE — Plan of Care (Signed)
Problem: Consults Goal: Depression Patient Education See Patient Education Module for education specifics.  Outcome: Completed/Met Date Met:  09/14/14 Nurse discussed depression with pt.

## 2014-09-14 NOTE — Plan of Care (Signed)
Problem: Alteration in mood Goal: LTG-Patient reports reduction in suicidal thoughts (Patient reports reduction in suicidal thoughts and is able to verbalize a safety plan for whenever patient is feeling suicidal)  Outcome: Progressing Pt denied SI this shift.  She verbally contracted for safety.       

## 2014-09-14 NOTE — Progress Notes (Addendum)
[facility] MD Progress Note  09/14/2014 2:27 PM Jody [name]  MRN:  [id number] Subjective:  Patient states ,'I stopped taking my medication 2 months ago,I feel depressed and has been hearing AH.' Objective: Patient seen and chart reviewed.Patient appears to be very depressed ,has a flat affect ,has psychomotor retardation . Patient was noncompliant on her medications. Patient continues to endorse passive SI. Patient has several psychosocial stressors as well as is compliant on medications. Patient per staff has been internally preoccupied ,appears to be responding to internal stimuli.  Patient is currently on withdrawal protocol -since per H&p she was drinking heavily. Patient denies any side effects to any medications. Patient denies any other concerns.   Diagnosis:   DSM5: Primary Psychiatric Diagnosis: Major depressive disorder ,recurrent ,severe with psychosis   Secondary Psychiatric Diagnosis: Alcohol use disorder,severe Alcohol withdrawal without perceptual disturbances Noncompliance with medication.   Non Psychiatric Diagnosis: See pmh  Total Time spent with patient: 45 minutes   ADL's:  Intact  Sleep: Fair  Appetite:  Fair   Psychiatric Specialty Exam: Physical Exam  ROS  Blood pressure 123/86, pulse 86, temperature 98.2 F (36.8 C), temperature source Oral, resp. rate 17, height 5\' 4"  (1.626 m), weight 70.761 kg (156 lb), last menstrual period 09/01/2014, SpO2 100 %.Body mass index is 26.76 kg/(m^2).  General Appearance: Disheveled  Eye SolicitorContact::  Fair  Speech:  Normal Rate  Volume:  Decreased  Mood:  Anxious, Depressed and Dysphoric  Affect:  Flat  Thought Process:  Disorganized and Irrelevant  Orientation:  Full (Time, Place, and Person)  Thought Content:  Hallucinations: Auditory  Suicidal Thoughts:  Yes.  without intent/plan  Homicidal Thoughts:  No  Memory:  Immediate;   Fair Recent;   Fair Remote;   Fair  Judgement:  Impaired  Insight:  Shallow   Psychomotor Activity:  Decreased  Concentration:  Poor  Recall:  Fis[employer]Fair  Fund of Knowledge:Fair  Language: Fair  Akathisia:  No  Handed:  Right  AIMS (if indicated):     Assets:  Communication Skills Desire for Improvement  Sleep:  Number of Hours: 6.75   Musculoskeletal: Strength & Muscle Tone: within normal limits Gait & Station: normal Patient leans: N/A  Current Medications: Current Facility-Administered Medications  Medication Dose Route Frequency Provider Last Rate Last Dose  . acetaminophen (TYLENOL) tablet 650 mg  650 mg Oral Q6H PRN Earney NavyJosephine C Onuoha, NP      . alum & mag hydroxide-simeth (MAALOX/MYLANTA) 200-200-20 MG/5ML suspension 30 mL  30 mL Oral Q4H PRN Earney NavyJosephine C Onuoha, NP      . benztropine (COGENTIN) tablet 0.5 mg  0.5 mg Oral QHS Earney NavyJosephine C Onuoha, NP   0.5 mg at 09/13/14 2128  . FLUoxetine (PROZAC) capsule 20 mg  20 mg Oral Daily Earney NavyJosephine C Onuoha, NP   20 mg at 09/14/14 0847  . hydrOXYzine (ATARAX/VISTARIL) tablet 25 mg  25 mg Oral Q4H PRN Earney NavyJosephine C Onuoha, NP   25 mg at 09/12/14 1842  . ibuprofen (ADVIL,MOTRIN) tablet 600 mg  600 mg Oral Q8H PRN Earney NavyJosephine C Onuoha, NP      . loperamide (IMODIUM) capsule 2-4 mg  2-4 mg Oral PRN Jomarie LongsSaramma Zennie Ayars, MD      . LORazepam (ATIVAN) tablet 1 mg  1 mg Oral Q8H PRN Earney NavyJosephine C Onuoha, NP   1 mg at 09/12/14 2113  . LORazepam (ATIVAN) tablet 1 mg  1 mg Oral TID Jomarie LongsSaramma Jearl Soto, MD   1 mg at 09/14/14 1218  Followed by  . [START ON 09/15/2014] LORazepam (ATIVAN) tablet 1 mg  1 mg Oral BID Jomarie LongsSaramma Lavonia Eager, MD       Followed by  . [START ON 09/16/2014] LORazepam (ATIVAN) tablet 1 mg  1 mg Oral Daily Cassundra Mckeever, MD      . magnesium hydroxide (MILK OF MAGNESIA) suspension 30 mL  30 mL Oral Daily PRN Earney NavyJosephine C Onuoha, NP      . multivitamin with minerals tablet 1 tablet  1 tablet Oral Daily Jomarie LongsSaramma Grover Woodfield, MD   1 tablet at 09/14/14 0847  . nicotine (NICODERM CQ - dosed in mg/24 hours) patch 14 mg  14 mg Transdermal Daily  Earney NavyJosephine C Onuoha, NP   14 mg at 09/14/14 0848  . OLANZapine zydis (ZYPREXA) disintegrating tablet 10 mg  10 mg Oral QHS Rachael FeeIrving A Lugo, MD   10 mg at 09/13/14 2128  . OLANZapine zydis (ZYPREXA) disintegrating tablet 5 mg  5 mg Oral BID Jomarie LongsSaramma Shaney Deckman, MD   5 mg at 09/14/14 1025  . ondansetron (ZOFRAN) tablet 4 mg  4 mg Oral Q8H PRN Earney NavyJosephine C Onuoha, NP      . thiamine (VITAMIN B-1) tablet 100 mg  100 mg Oral Daily Lesleyanne Politte, MD   100 mg at 09/14/14 0847  . zolpidem (AMBIEN) tablet 5 mg  5 mg Oral QHS PRN Earney NavyJosephine C Onuoha, NP        Lab Results: No results found for this or any previous visit (from the past 48 hour(s)).  Physical Findings: AIMS: Facial and Oral Movements Muscles of Facial Expression: None, normal Lips and Perioral Area: None, normal Jaw: None, normal Tongue: None, normal,Extremity Movements Upper (arms, wrists, hands, fingers): None, normal Lower (legs, knees, ankles, toes): None, normal, Trunk Movements Neck, shoulders, hips: None, normal, Overall Severity Severity of abnormal movements (highest score from questions above): None, normal Incapacitation due to abnormal movements: None, normal Patient's awareness of abnormal movements (rate only patient's report): No Awareness, Dental Status Current problems with teeth and/or dentures?: No Does patient usually wear dentures?: No  CIWA:  CIWA-Ar Total: 1 COWS:  COWS Total Score: 2  Treatment Plan Summary: Daily contact with patient to assess and evaluate symptoms and progress in treatment Medication management  Plan: Will start Zyprexa zydis 5 mg po bid and 10 mg at bedtime. Continue Cogentin. Will continue Prozac 20 mg po daily. Could increase the dose tomorrow. Patient was noncompliant on the medication. Will continue Ambien 5 mg po qhs prn for sleep. Will continue CIWA/Ativan protocol for alcohol use disorder. Plan to refer to substance abuse program as well as vocational rehab once stable. Patient to  monitored for withdrawal symptoms as well as side effects. CSW will work on disposition.   Medical Decision Making Problem Points:  Established problem, stable/improving (1), New problem, with no additional work-up planned (3), Review of last therapy session (1) and Review of psycho-social stressors (1) Data Points:  Review or order clinical lab tests (1) Review and summation of old records (2) Review of medication regiment & side effects (2) Review of new medications or change in dosage (2)  I certify that inpatient services furnished can reasonably be expected to improve the patient's condition.   Dalaya Suppa MD 09/14/2014, 2:27 PM

## 2014-09-14 NOTE — [facility] Group Notes (Signed)
[facility] LCSW Group Therapy  09/14/2014 1:44 PM  Type of Therapy:  Group Therapy  Participation Level: Did not attend.   Summary of Progress/Problems:Today's Topic: Overcoming Obstacles. Pt was challenged to identify obstacles faced currently and processed barriers involved in overcoming these obstacles. Group members were asked to process the steps necessary for overcoming these obstacles and explored motivation (internal and external) for facing these difficulties head on. Pt was asked to identify at least one area of concern in their lives and choose a skill of focus pulled from their "toolbox."    [name],Jody 09/14/2014, 1:44 PM

## 2014-09-14 NOTE — Progress Notes (Signed)
D: Pt has apprehensive affect with suspicious, irritable mood.  Pt remained in her room for the majority of the shift.  She is slow to respond to staff when staff asks her a question.  Pt's sister visited and pt reported that it was a good visit.  When asked if pt was having hallucinations, pt refused to respond.  Pt denies SI/HI.  When pt's sister was visiting, pt reported that she would take her medications.  When writer returned with HS medications later in the shift, pt initially refused.  Writer and another RN offered pt her HS medications again and pt took her scheduled medications.  Pt continues to be suspicious of staff and has not been interacting with peers.  A: Medications administered per order.  Safety maintained.  Met with pt 1:1 and offered support and encouragement.   R: Pt was compliant with medication after much encouragement from staff.  Pt verbally contracted for safety.  She reported that she will notify staff of needs and concerns.  Will continue to monitor and assess for safety.
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
## 2014-09-15 MED ORDER — FLUOXETINE HCL 20 MG PO CAPS
40.0000 mg | ORAL_CAPSULE | Freq: Every day | ORAL | Status: DC
  Administered 2014-09-16 – 2014-09-17 (×2): 40 mg via ORAL
  Filled 2014-09-15 (×3): qty 2

## 2014-09-15 NOTE — [facility] Group Notes (Signed)
[facility] Group Notes:  (Counselor/Nursing/MHT/Case Management/Adjunct)  09/15/2014 1:15PM  Type of Therapy:  Group Therapy  Participation Level:  Invited.  Chose to not attend  Modes of Intervention:  Discussion, Exploration and Socialization  Summary of Progress/Problems: The topic for group was balance in life.  Pt participated in the discussion about when their life was in balance and out of balance and how this feels.  Pt discussed ways to get back in balance and short term goals they can work on to get where they want to be.    Daryel Geraldorth, Liliann File B 09/15/2014 10:41 AM

## 2014-09-15 NOTE — Progress Notes (Signed)
D:  Patient denied SI and HI, contracts for safety.  Denieid A/V hallucinations.  Denied pain.  Patient has stayed in bed most of day.  Not attended groups.  Rated depression 4, hopeless 6, denied anxiety. A:  Medications administered per MD orders.  Emotional support and encouragement given patient. R:  Safety maintained with 15 minute checks.

## 2014-09-15 NOTE — Progress Notes (Signed)
[facility]  09/15/2014 10:19 AM Jody SpruceRosalind L [name]  MRN:  [id number] Subjective:  Patient states ,'I feel OK ,I am ready to go home,why are keeping me here like this."  Objective: Patient seen and chart reviewed.Patient appears to be very irritable this AM. Patient continues to have a depressed affect ,is withdrawn and isolative in her room most of the time. Patient per staff has been waking up and getting out of bed only to take her meals. Patient has been compliant on her medications.  Patient denies any side effects. I have reviewed her vital signs and it is WNL.   Patient has several psychosocial stressors financial issues,job loss and so on.     Diagnosis:   DSM5: Primary Psychiatric Diagnosis: Major depressive disorder ,recurrent ,severe with psychosis   Secondary Psychiatric Diagnosis: Alcohol use disorder,severe Alcohol withdrawal without perceptual disturbances Noncompliance with medication.   Non Psychiatric Diagnosis: See pmh  Total Time spent with patient: 45 minutes   ADL's:  Intact  Sleep: Fair  Appetite:  Fair   Psychiatric Specialty Exam: Physical Exam  ROS  Blood pressure 126/91, pulse 89, temperature 98.2 F (36.8 C), temperature source Oral, resp. rate 17, height 5\' 4"  (1.626 m), weight 70.761 kg (156 lb), last menstrual period 09/01/2014, SpO2 100 %.Body mass index is 26.76 kg/(m^2).  General Appearance: Disheveled  Eye SolicitorContact::  Fair  Speech:  Normal Rate  Volume:  Decreased  Mood:  Anxious, Depressed, Dysphoric and Irritable  Affect:  Flat  Thought Process:  Irrelevant improving   Orientation:  Full (Time, Place, and Person)  Thought Content:  Hallucinations: Auditory denies any AH today ,but appears to be internally preoccupied.  Suicidal Thoughts:  No  Homicidal Thoughts:  No  Memory:  Immediate;   Fair Recent;   Fair Remote;   Fair  Judgement:  Impaired  Insight:  Shallow  Psychomotor Activity:  Decreased  Concentration:   Poor  Recall:  FiservFair  Fund of Knowledge:Fair  Language: Fair  Akathisia:  No  Handed:  Right  AIMS (if indicated):     Assets:  Communication Skills Desire for Improvement  Sleep:  Number of Hours: 6   Musculoskeletal: Strength & Muscle Tone: within normal limits Gait & Station: normal Patient leans: N/A  Current Medications: Current Facility-Administered Medications  Medication Dose Route Frequency Provider Last Rate Last Dose  . acetaminophen (TYLENOL) tablet 650 mg  650 mg Oral Q6H PRN Earney NavyJosephine C Onuoha, NP      . alum & mag hydroxide-simeth (MAALOX/MYLANTA) 200-200-20 MG/5ML suspension 30 mL  30 mL Oral Q4H PRN Earney NavyJosephine C Onuoha, NP      . benztropine (COGENTIN) tablet 0.5 mg  0.5 mg Oral QHS Earney NavyJosephine C Onuoha, NP   0.5 mg at 09/14/14 2225  . [START ON 09/16/2014] FLUoxetine (PROZAC) capsule 40 mg  40 mg Oral Daily Tara Wich, MD      . hydrOXYzine (ATARAX/VISTARIL) tablet 25 mg  25 mg Oral Q4H PRN Earney NavyJosephine C Onuoha, NP   25 mg at 09/12/14 1842  . ibuprofen (ADVIL,MOTRIN) tablet 600 mg  600 mg Oral Q8H PRN Earney NavyJosephine C Onuoha, NP      . loperamide (IMODIUM) capsule 2-4 mg  2-4 mg Oral PRN Jomarie LongsSaramma Ikey Omary, MD      . LORazepam (ATIVAN) tablet 1 mg  1 mg Oral Q8H PRN Earney NavyJosephine C Onuoha, NP   1 mg at 09/12/14 2113  . LORazepam (ATIVAN) tablet 1 mg  1 mg Oral BID Gerhardt Gleed,  MD   1 mg at 09/15/14 0806   Followed by  . [START ON 09/16/2014] LORazepam (ATIVAN) tablet 1 mg  1 mg Oral Daily Kryssa Risenhoover, MD      . magnesium hydroxide (MILK OF MAGNESIA) suspension 30 mL  30 mL Oral Daily PRN Earney NavyJosephine C Onuoha, NP      . multivitamin with minerals tablet 1 tablet  1 tablet Oral Daily Jomarie LongsSaramma Deon Ivey, MD   1 tablet at 09/15/14 0806  . nicotine (NICODERM CQ - dosed in mg/24 hours) patch 14 mg  14 mg Transdermal Daily Earney NavyJosephine C Onuoha, NP   14 mg at 09/15/14 0807  . OLANZapine zydis (ZYPREXA) disintegrating tablet 10 mg  10 mg Oral QHS Rachael FeeIrving A Lugo, MD   10 mg at 09/14/14 2225   . OLANZapine zydis (ZYPREXA) disintegrating tablet 5 mg  5 mg Oral BID Jomarie LongsSaramma Cataleyah Colborn, MD   5 mg at 09/15/14 1013  . ondansetron (ZOFRAN) tablet 4 mg  4 mg Oral Q8H PRN Earney NavyJosephine C Onuoha, NP      . thiamine (VITAMIN B-1) tablet 100 mg  100 mg Oral Daily Deshon Koslowski, MD   100 mg at 09/15/14 0806  . zolpidem (AMBIEN) tablet 5 mg  5 mg Oral QHS PRN Earney NavyJosephine C Onuoha, NP        Lab Results: No results found for this or any previous visit (from the past 48 hour(s)).  Physical Findings: AIMS: Facial and Oral Movements Muscles of Facial Expression: None, normal Lips and Perioral Area: None, normal Jaw: None, normal Tongue: None, normal,Extremity Movements Upper (arms, wrists, hands, fingers): None, normal Lower (legs, knees, ankles, toes): None, normal, Trunk Movements Neck, shoulders, hips: None, normal, Overall Severity Severity of abnormal movements (highest score from questions above): None, normal Incapacitation due to abnormal movements: None, normal Patient's awareness of abnormal movements (rate only patient's report): No Awareness, Dental Status Current problems with teeth and/or dentures?: No Does patient usually wear dentures?: No  CIWA:  CIWA-Ar Total: 1 COWS:  COWS Total Score: 2  Treatment Plan Summary: Daily contact with patient to assess and evaluate symptoms and progress in treatment Medication management  Plan: Will continue Zyprexa zydis 5 mg po bid and 10 mg at bedtime. Continue Cogentin. Will increase Prozac to 40 mg po daily.  Will continue Ambien 5 mg po qhs prn for sleep. Will continue CIWA/Ativan protocol for alcohol use disorder. Plan to refer to substance abuse program as well as vocational rehab once stable. Patient to monitored for withdrawal symptoms as well as side effects. CSW will work on disposition.   Medical Decision Making Problem Points:  Established problem, stable/improving (1), New problem, with no additional work-up planned (3), Review  of last therapy session (1) and Review of psycho-social stressors (1) Data Points:  Review or order clinical lab tests (1) Review and summation of old records (2) Review of medication regiment & side effects (2) Review of new medications or change in dosage (2)  I certify that inpatient services furnished can reasonably be expected to improve the patient's condition.   Quang Thorpe MD 09/15/2014, 10:19 AM

## 2014-09-15 NOTE — Progress Notes (Signed)
Adult Psychoeducational Group Note  Date:  09/15/2014 Time:  9:43 PM  Group Topic/Focus:  Wrap-Up Group:   The focus of this group is to help patients review their daily goal of treatment and discuss progress on daily workbooks.  Participation Level:  Minimal  Participation Quality:  Appropriate  Affect:  Flat  Cognitive:  Appropriate  Insight: Appropriate  Engagement in Group:  Engaged  Modes of Intervention:  Socialization and Support  Additional Comments:  Patient attended and participated in group tonight. She reports having a good day. She was relaxed, she watched television and socialized in the day room. She meditated a little and have her mother and sister visited with her.  Lita MainsFrancis, Ethyle Tiedt [facility] 09/15/2014, 9:43 PM

## 2014-09-15 NOTE — [facility] Group Notes (Signed)
The focus of this group is to educate the patient on the purpose and policies of crisis stabilization and provide a format to answer questions about their admission.  The group details unit policies and expectations of patients while admitted.  Patient did not attend 0900 nurse education orientation group this morning.  Patient stayed in her room sleeping.

## 2014-09-15 NOTE — Plan of Care (Signed)
Problem: Consults Goal: [facility] General Treatment Patient Education Outcome: Completed/Met Date Met:  09/15/14 Nurse discussed treatment plan with pt.

## 2014-09-15 NOTE — Progress Notes (Signed)
Patient ID: [name], female   DOB: 1970/10/14, 43 y.o.   MRN: [id number] D: Client lying in bed, alert, reports she had not been going to groups because "thing remain the same, I was here before and things the same" "groups helpful a little bit" "trying to keep myself together, need to relax my mind" Client denies AVH, SHI. Client reports she had visits with family today "it was good" A: Clinical research associateWriter introduced self to client reviewed medications administered as ordered, encourage group. Staff will monitor q5315min for safety. R: Client is safe on the unit, attended group.

## 2014-09-15 NOTE — Progress Notes (Signed)
D: Pt denies SI/HI/AVH. Pt is pleasant and a little irritable. Pt had minimal interaction on unit, sleeping most of the night, only getting up to get something to eat.   A: Pt was offered support and encouragement. Pt was given scheduled medications. Pt was encourage to attend groups. Q 15 minute checks were done for safety.   R:. Pt is taking medication. Pt receptive to treatment and safety maintained on unit.

## 2014-09-16 NOTE — Progress Notes (Addendum)
[facility] MD Progress Note  09/16/2014 4:01 PM Jody [name]  MRN:  [id number] Subjective:  Patient states [facility],'I feel better."  Objective: Patient seen and chart reviewed.Patient appears to be depressed ,but reports improvement in her sx since admission. Patient has better insight in to her mental illness as well as alcohol abuse. Reports that she would take her medications regularly to make sure she stays out of hospitals. Patient has been encouraged to attend group activities today. She denies any withdrawal sx, anxiety or irritability .Patient denies any side effects. I have reviewed her vital signs and it is WNL.   Patient has several psychosocial stressors financial issues,job loss and so on.     Diagnosis:   DSM5: Primary Psychiatric Diagnosis: Major depressive disorder ,recurrent ,severe with psychosis   Secondary Psychiatric Diagnosis: Alcohol use disorder,severe Alcohol withdrawal without perceptual disturbances Noncompliance with medication.   Non Psychiatric Diagnosis: See pmh  Total Time spent with patient: 45 minutes   ADL's:  Intact  Sleep: Fair  Appetite:  Fair   Psychiatric Specialty Exam: Physical Exam  ROS  Blood pressure 134/93, pulse 73, temperature 99 F (37.2 C), temperature source Oral, resp. rate 20, height 5\' 4"  (1.626 m), weight 70.761 kg (156 lb), last menstrual period 09/01/2014, SpO2 100 %.Body mass index is 26.76 kg/(m^2).  General Appearance: Disheveled  Eye SolicitorContact::  Fair  Speech:  Normal Rate  Volume:  Decreased  Mood:  Anxious, Depressed, Dysphoric and Irritable improving  Affect:  Flat  Thought Process:  Irrelevant improving   Orientation:  Full (Time, Place, and Person)  Thought Content:  Hallucinations: Auditory improving ,denies AH today  Suicidal Thoughts:  No  Homicidal Thoughts:  No  Memory:  Immediate;   Fair Recent;   Fair Remote;   Fair  Judgement:  Fair  Insight:  Shallow  Psychomotor Activity:  Decreased   Concentration:  Fair  Recall:  [employer]Fair  Fund of Knowledge:Fair  Language: Fair  Akathisia:  No  Handed:  Right  AIMS (if indicated):     Assets:  Communication Skills Desire for Improvement  Sleep:  Number of Hours: 6   Musculoskeletal: Strength & Muscle Tone: within normal limits Gait & Station: normal Patient leans: N/A  Current Medications: Current Facility-Administered Medications  Medication Dose Route Frequency Provider Last Rate Last Dose  . acetaminophen (TYLENOL) tablet 650 mg  650 mg Oral Q6H PRN Earney NavyJosephine C Onuoha, NP   650 mg at 09/16/14 0414  . alum & mag hydroxide-simeth (MAALOX/MYLANTA) 200-200-20 MG/5ML suspension 30 mL  30 mL Oral Q4H PRN Earney NavyJosephine C Onuoha, NP      . benztropine (COGENTIN) tablet 0.5 mg  0.5 mg Oral QHS Earney NavyJosephine C Onuoha, NP   0.5 mg at 09/15/14 2132  . FLUoxetine (PROZAC) capsule 40 mg  40 mg Oral Daily Suzanna Zahn, MD   40 mg at 09/16/14 0836  . hydrOXYzine (ATARAX/VISTARIL) tablet 25 mg  25 mg Oral Q4H PRN Earney NavyJosephine C Onuoha, NP   25 mg at 09/12/14 1842  . ibuprofen (ADVIL,MOTRIN) tablet 600 mg  600 mg Oral Q8H PRN Earney NavyJosephine C Onuoha, NP      . LORazepam (ATIVAN) tablet 1 mg  1 mg Oral Q8H PRN Earney NavyJosephine C Onuoha, NP   1 mg at 09/12/14 2113  . magnesium hydroxide (MILK OF MAGNESIA) suspension 30 mL  30 mL Oral Daily PRN Earney NavyJosephine C Onuoha, NP      . multivitamin with minerals tablet 1 tablet  1 tablet Oral Daily Estill Llerena  Alivya Wegman, MD   1 tablet at 09/16/14 0836  . nicotine (NICODERM CQ - dosed in mg/24 hours) patch 14 mg  14 mg Transdermal Daily Earney NavyJosephine C Onuoha, NP   14 mg at 09/16/14 0836  . OLANZapine zydis (ZYPREXA) disintegrating tablet 10 mg  10 mg Oral QHS Rachael FeeIrving A Lugo, MD   10 mg at 09/15/14 2132  . OLANZapine zydis (ZYPREXA) disintegrating tablet 5 mg  5 mg Oral BID Jomarie LongsSaramma Aleia Larocca, MD   5 mg at 09/16/14 1037  . ondansetron (ZOFRAN) tablet 4 mg  4 mg Oral Q8H PRN Earney NavyJosephine C Onuoha, NP      . thiamine (VITAMIN B-1) tablet 100 mg  100 mg  Oral Daily Marvena Tally, MD   100 mg at 09/16/14 0836  . zolpidem (AMBIEN) tablet 5 mg  5 mg Oral QHS PRN Earney NavyJosephine C Onuoha, NP   5 mg at 09/15/14 2134    Lab Results: No results found for this or any previous visit (from the past 48 hour(s)).  Physical Findings: AIMS: Facial and Oral Movements Muscles of Facial Expression: None, normal Lips and Perioral Area: None, normal Jaw: None, normal Tongue: None, normal,Extremity Movements Upper (arms, wrists, hands, fingers): None, normal Lower (legs, knees, ankles, toes): None, normal, Trunk Movements Neck, shoulders, hips: None, normal, Overall Severity Severity of abnormal movements (highest score from questions above): None, normal Incapacitation due to abnormal movements: None, normal Patient's awareness of abnormal movements (rate only patient's report): No Awareness, Dental Status Current problems with teeth and/or dentures?: No Does patient usually wear dentures?: No  CIWA:  CIWA-Ar Total: 1 COWS:  COWS Total Score: 1  Treatment Plan Summary: Daily contact with patient to assess and evaluate symptoms and progress in treatment Medication management  Plan: Will continue Zyprexa zydis 5 mg po bid and 10 mg at bedtime. Continue Cogentin. Will continue Prozac  40 mg po daily.  Will continue Ambien 5 mg po qhs prn for sleep. Will order TSH ,lipid panel ,Hb a1c ,given that she has mood disorder as well as is on an antipsychotic. Will continue CIWA/Ativan protocol for alcohol use disorder. Plan to refer to substance abuse program as well as vocational rehab once stable. Patient to monitored for withdrawal symptoms as well as side effects. CSW will work on disposition.   Medical Decision Making Problem Points:  Established problem, stable/improving (1), New problem, with no additional work-up planned (3), Review of last therapy session (1) and Review of psycho-social stressors (1) Data Points:  Review and summation of old records  (2) Review of medication regiment & side effects (2) Review of new medications or change in dosage (2)  I certify that inpatient services furnished can reasonably be expected to improve the patient's condition.   Darlette Dubow MD 09/16/2014, 4:01 PM

## 2014-09-16 NOTE — Progress Notes (Signed)
Patient ID: [name], female   DOB: 1970/10/21, 43 y.o.   MRN: [id number] D: client is somatic this morning now complaining that her spleen has rupture and she knows because she seen it in a dream. Client has low grade temperature of 99.0 A: Writer provided emotional support, ask her to follow up with physician, unable to administer Acetaminophen at this time, as dose was given at 0414, writer pushed fluids. R: client reports she let physician know her concerns.

## 2014-09-16 NOTE — [facility] Group Notes (Signed)
[facility]  09/16/2014 , 1:32 PM    Type of Therapy:  [employer] Presentation  Participation Level:  Active  Participation Quality:  Attentive  Affect:  Blunted  Cognitive:  Oriented  Insight:  Limited  Engagement in Therapy:  Engaged  Modes of Intervention:  Discussion, Education and Socialization  Summary of Progress/Problems:  Jody [name] from [employer] came to present his recovery story and play the guitar.  Sat quietly throughout.  Stayed for the entire session.  Daryel Geraldorth, Didi Ganaway B 09/16/2014 , 1:32 PM

## 2014-09-16 NOTE — Progress Notes (Signed)
Patient ID: [name], female   DOB: Mar 20, 1971, 43 y.o.   MRN: [id number] D: Client up disorganized reports to tech her heart wasn't beating.client appeared to be internally preoccupied. A: Writer assessed and client then reports pain on right side, 02 Sat @ 100, Pulse 77, administered Acetaminophen 650 mg po. R: client went back to bed, writer to reassess in an hour.

## 2014-09-17 MED ORDER — FLUOXETINE HCL 20 MG PO CAPS
60.0000 mg | ORAL_CAPSULE | Freq: Every day | ORAL | Status: DC
  Administered 2014-09-18 – 2014-09-19 (×2): 60 mg via ORAL
  Filled 2014-09-17 (×4): qty 3

## 2014-09-17 MED ORDER — BUSPIRONE HCL 5 MG PO TABS
5.0000 mg | ORAL_TABLET | Freq: Three times a day (TID) | ORAL | Status: DC
  Administered 2014-09-17 – 2014-09-19 (×7): 5 mg via ORAL
  Filled 2014-09-17 (×12): qty 1

## 2014-09-17 NOTE — Progress Notes (Signed)
Pt seems much better today.  She had refused her am labs this morning however she was med compliant today and up for the groups and meals.  She rated her depression 2 hopelessness a 1 and denied anxiety on her self-inventory.  She did c/o increase in anxiety around mid-morning.  Dr. Elna BreslowEappen did start buspar 5 mg tid with the first dose around 1400 so held the 1700 dose until after dinner at 1816.  She denies any S/H ideation or A/V/H.  We did discuss her labs she wanted to know what kind of labs was ordered.  Informed her hemoglobin A1c, TSH and a Lipid panel.  She wanted them written down and was given to pt.  She voiced understanding and at this point states,"I will let them do my blood tomorrow"

## 2014-09-17 NOTE — Plan of Care (Signed)
Problem: Diagnosis: Increased Risk For Suicide Attempt Goal: STG-Patient Will Report Suicidal Feelings to Staff Outcome: Progressing Pt is currently denying any S/I however, she does contract verbally to not act on self harm thoughts she did agree and voiced understanding.

## 2014-09-17 NOTE — [facility] Group Notes (Signed)
[facility] LCSW Group Therapy  09/17/2014 1:48 PM  Type of Therapy:  Group Therapy  Participation Level:  Minimal  Participation Quality:  Attentive  Affect:  Depressed and Flat  Cognitive:  Lacking  Insight:  Limited  Engagement in Therapy:  Limited  Modes of Intervention:  Confrontation, Discussion, Education, Exploration, Problem-solving, Rapport Building, Socialization and Support  Summary of Progress/Problems: Relapse and Recovery-patients were asked to think about what these concepts mean to them, how relapse has impacted them, and how relapse can be a stepping stone to recovery. Patients were encouraged to share personal stories of relapse and recovery as it relates to mental illness and substance abuse. Jody [name] was attentive but minimally engaged in active discussion during today's processing group. She participated when called upon and stated that her family and God were positive supports for her in her recovery process. [name] remained attentive throughout group and actively listened as others shared, demonstrating some progress in the group setting.   Smart, Jody [name] LCSWA 09/17/2014, 1:48 PM

## 2014-09-17 NOTE — Plan of Care (Signed)
Problem: Alteration in thought process Goal: LTG-Patient has not harmed self or others in at least 2 days Outcome: Completed/Met Date Met:  09/17/14 Pt has been here since the 12th and hasn't attempted to harm herself or others. Goal: LTG-Patient behavior demonstrates decreased signs psychosis Pt denies AH and does not appear paranoid. She is isolating in her room, but this could be the depression. Goal met. [place], Bryce Intern 09/14/2014 4:49 PM  Outcome: Progressing Pt is still reluctant to take meds and needs encouragement to do so.

## 2014-09-17 NOTE — [facility] Group Notes (Signed)
0900 nursing orientation group  The focus of this group is to educate the patient on the purpose and policies of crisis stabilization and provide a format to answer questions about their admission.  The group details unit policies and expectations of patients while admitted.   Pt did attend. She did not share anything with the group but would nod her head in agreement.

## 2014-09-17 NOTE — [facility] Suicide Risk Assessment (Signed)
[facility] INPATIENT:  Family/Significant Other Suicide Prevention Education  Suicide Prevention Education:  Education Completed;Mother, Ms Amada JupiterKirkpatrick, [phone] has been identified by the patient as the family member/significant other with whom the patient will be residing, and identified as the person(s) who will aid the patient in the event of a mental health crisis (suicidal ideations/suicide attempt).  With written consent from the patient, the family member/significant other has been provided the following suicide prevention education, prior to the and/or following the discharge of the patient.  The suicide prevention education provided includes the following:  Suicide risk factors  Suicide prevention and interventions  National Suicide Hotline telephone number  [facility] assessment telephone number  [facility] Assistance 911  [facility] and/or Residential Mobile Crisis Unit telephone number  Request made of family/significant other to:  Remove weapons (e.g., guns, rifles, knives), all items previously/currently identified as safety concern.    Remove drugs/medications (over-the-counter, prescriptions, illicit drugs), all items previously/currently identified as a safety concern.  The family member/significant other verbalizes understanding of the suicide prevention education information provided.  The family member/significant other agrees to remove the items of safety concern listed above.  Daryel Geraldorth, Shakeel Disney B 09/17/2014, 1:50 PM

## 2014-09-17 NOTE — Progress Notes (Signed)
[facility]  09/17/2014 4:02 PM Geoffery SpruceRosalind L Radu  MRN:  [id number] Subjective:  Patient states ,'I feel anxious."  Objective: Patient seen and chart reviewed.Patient appears to be anxious this AM ,reports feeling anxious when she went to the cafeteria. Patient reports she is worried about her 41 year old daughter at home. Patient reports improvement in her depressive symptoms,she reports sleep and appetite as improved and denies SI/HI/AH/VH.  However per staff patient has been very hesitant to take her medications, needs a lot of encouragement. Patient refused her labs this AM.  Patient denies any side effects. I have reviewed her vital signs and it is WNL.   Patient has several psychosocial stressors financial issues,job loss and so on.     Diagnosis:   DSM5: Primary Psychiatric Diagnosis: Major depressive disorder ,recurrent ,severe with psychosis   Secondary Psychiatric Diagnosis: Alcohol use disorder,severe Alcohol withdrawal without perceptual disturbances Noncompliance with medication.   Non Psychiatric Diagnosis: See pmh  Total Time spent with patient: 45 minutes   ADL's:  Intact  Sleep: Fair  Appetite:  Fair   Psychiatric Specialty Exam: Physical Exam  ROS  Blood pressure 143/82, pulse 88, temperature 98.4 F (36.9 C), temperature source Oral, resp. rate 16, height 5\' 4"  (1.626 m), weight 70.761 kg (156 lb), last menstrual period 09/01/2014, SpO2 100 %.Body mass index is 26.76 kg/(m^2).  General Appearance: Disheveled  Eye SolicitorContact::  Fair  Speech:  Normal Rate  Volume:  Decreased  Mood:  Anxious and Depressed improving  Affect:  Flat  Thought Process:  Irrelevant improving   Orientation:  Full (Time, Place, and Person)  Thought Content:  Paranoid Ideation improving ,denies AH today  Suicidal Thoughts:  No  Homicidal Thoughts:  No  Memory:  Immediate;   Fair Recent;   Fair Remote;   Fair  Judgement:  Fair  Insight:  Shallow  Psychomotor  Activity:  Decreased  Concentration:  Fair  Recall:  [employer]Fair  Fund of Knowledge:Fair  Language: Fair  Akathisia:  No  Handed:  Right  AIMS (if indicated):     Assets:  Communication Skills Desire for Improvement  Sleep:  Number of Hours: 6.25   Musculoskeletal: Strength & Muscle Tone: within normal limits Gait & Station: normal Patient leans: N/A  Current Medications: Current Facility-Administered Medications  Medication Dose Route Frequency Provider Last Rate Last Dose  . acetaminophen (TYLENOL) tablet 650 mg  650 mg Oral Q6H PRN Earney NavyJosephine C Onuoha, NP   650 mg at 09/16/14 0414  . alum & mag hydroxide-simeth (MAALOX/MYLANTA) 200-200-20 MG/5ML suspension 30 mL  30 mL Oral Q4H PRN Earney NavyJosephine C Onuoha, NP      . benztropine (COGENTIN) tablet 0.5 mg  0.5 mg Oral QHS Earney NavyJosephine C Onuoha, NP   0.5 mg at 09/16/14 2203  . busPIRone (BUSPAR) tablet 5 mg  5 mg Oral TID Jomarie LongsSaramma Alexys Gassett, MD   5 mg at 09/17/14 1408  . [START ON 09/18/2014] FLUoxetine (PROZAC) capsule 60 mg  60 mg Oral Daily Kyandra Mcclaine, MD      . hydrOXYzine (ATARAX/VISTARIL) tablet 25 mg  25 mg Oral Q4H PRN Earney NavyJosephine C Onuoha, NP   25 mg at 09/12/14 1842  . ibuprofen (ADVIL,MOTRIN) tablet 600 mg  600 mg Oral Q8H PRN Earney NavyJosephine C Onuoha, NP      . LORazepam (ATIVAN) tablet 1 mg  1 mg Oral Q8H PRN Earney NavyJosephine C Onuoha, NP   1 mg at 09/12/14 2113  . magnesium hydroxide (MILK OF MAGNESIA) suspension  30 mL  30 mL Oral Daily PRN Earney NavyJosephine C Onuoha, NP      . multivitamin with minerals tablet 1 tablet  1 tablet Oral Daily Jomarie LongsSaramma Cas Tracz, MD   1 tablet at 09/17/14 0801  . nicotine (NICODERM CQ - dosed in mg/24 hours) patch 14 mg  14 mg Transdermal Daily Earney NavyJosephine C Onuoha, NP   14 mg at 09/17/14 0800  . OLANZapine zydis (ZYPREXA) disintegrating tablet 10 mg  10 mg Oral QHS Rachael FeeIrving A Lugo, MD   10 mg at 09/16/14 2203  . OLANZapine zydis (ZYPREXA) disintegrating tablet 5 mg  5 mg Oral BID Jomarie LongsSaramma Yaritsa Savarino, MD   5 mg at 09/17/14 0817  .  ondansetron (ZOFRAN) tablet 4 mg  4 mg Oral Q8H PRN Earney NavyJosephine C Onuoha, NP      . thiamine (VITAMIN B-1) tablet 100 mg  100 mg Oral Daily Amrie Gurganus, MD   100 mg at 09/17/14 0801  . zolpidem (AMBIEN) tablet 5 mg  5 mg Oral QHS PRN Earney NavyJosephine C Onuoha, NP   5 mg at 09/15/14 2134    Lab Results: No results found for this or any previous visit (from the past 48 hour(s)).  Physical Findings: AIMS: Facial and Oral Movements Muscles of Facial Expression: None, normal Lips and Perioral Area: None, normal Jaw: None, normal Tongue: None, normal,Extremity Movements Upper (arms, wrists, hands, fingers): None, normal Lower (legs, knees, ankles, toes): None, normal, Trunk Movements Neck, shoulders, hips: None, normal, Overall Severity Severity of abnormal movements (highest score from questions above): None, normal Incapacitation due to abnormal movements: None, normal Patient's awareness of abnormal movements (rate only patient's report): No Awareness, Dental Status Current problems with teeth and/or dentures?: No Does patient usually wear dentures?: No  CIWA:  CIWA-Ar Total: 1 COWS:  COWS Total Score: 1  Treatment Plan Summary: Daily contact with patient to assess and evaluate symptoms and progress in treatment Medication management  Plan: Will continue Zyprexa zydis 5 mg po bid and 10 mg at bedtime. Continue Cogentin. Will increase Prozac to 60 mg po daily. Will add Buspar 5 mg po tid. Will continue Ambien 5 mg po qhs prn for sleep. Will re order TSH ,lipid panel ,Hb a1c ,given that she has mood disorder as well as is on an antipsychotic. Patient refused all her labs this AM. Will continue CIWA/Ativan protocol for alcohol use disorder. Plan to refer to substance abuse program as well as vocational rehab once stable. Patient to monitored for withdrawal symptoms as well as side effects. CSW will work on disposition.   Medical Decision Making Problem Points:  Established problem,  stable/improving (1), New problem, with no additional work-up planned (3), Review of last therapy session (1) and Review of psycho-social stressors (1) Data Points:  Review and summation of old records (2) Review of medication regiment & side effects (2) Review of new medications or change in dosage (2)  I certify that inpatient services furnished can reasonably be expected to improve the patient's condition.   Elesha Thedford MD 09/17/2014, 4:02 PM

## 2014-09-17 NOTE — Progress Notes (Signed)
Patient ID: Jody SpruceRosalind L [name], female   DOB: [birth year], 43 y.o.   MRN: [id number] reports groups have been helpful "getting supportive help and sharing my feelings" client [facility].  A: Writer provides emotional support, encourages group and follow up discharge plans. Staff will monitor q6815min for safety. R: Client is safe on the  Unit. Client did not attend karaoke.

## 2014-09-17 NOTE — Progress Notes (Signed)
   D: Pt was laying in bed during the assessment. Pt didn't readily open her eyes when prompted by the writer. However, after sever minutes pt did look at the writer, but still did not engage openly in conversation. Writer asked pt about the discussions with her treatment team, pt simply stated "alright".  Writer attempted to encourage pt to attend wrap up group, but she refused.  A:  Support and encouragement was offered. 15 min checks continued for safety.  R: Pt remains safe.

## 2014-09-17 NOTE — Tx Team (Signed)
  Interdisciplinary Treatment Plan Update   Date Reviewed:  09/17/2014  Time Reviewed:  3:53 PM  Progress in Treatment:   Attending groups: Yes Participating in groups: Minimally Taking medication as prescribed: Yes  Tolerating medication: Yes Family/Significant other contact made: Yes  Patient understands diagnosis: Yes  Discussing patient identified problems/goals with staff: Yes  See initial care plan Medical problems stabilized or resolved: Yes Denies suicidal/homicidal ideation: Yes  In tx team Patient has not harmed self or others: Yes  For review of initial/current patient goals, please see plan of care.  Estimated Length of Stay:  2 days/likely d/c Sat.  Reason for Continuation of Hospitalization: Depression Medication stabilization Other; describe Paranoia  New Problems/Goals identified:  N/A  Discharge Plan or Barriers:   return home, follow up outpt  Additional Comments:  'I feel better."  Patient appears to be depressed ,but reports improvement in her sx since admission. Patient has better insight in to her mental illness as well as alcohol abuse. Reports that she would take her medications regularly to make sure she stays out of hospitals.  Attendees:  Signature: Ivin BootySarama Eappen, MD 09/17/2014 3:53 PM   Signature: Richelle Itood Sanjit Mcmichael, LCSW 09/17/2014 3:53 PM  Signature:  09/17/2014 3:53 PM  Signature: Robbie LouisVivian Kent, RN 09/17/2014 3:53 PM  Signature:  09/17/2014 3:53 PM  Signature:  09/17/2014 3:53 PM  Signature:   09/17/2014 3:53 PM  Signature:    Signature:    Signature:    Signature:    Signature:    Signature:      Scribe for Treatment Team:   Richelle Itood De Jaworski, LCSW  09/17/2014 3:53 PM

## 2014-09-18 LAB — TSH: TSH: 1.62 u[IU]/mL (ref 0.350–4.500)

## 2014-09-18 LAB — LIPID PANEL
CHOL/HDL RATIO: 3.7 ratio
Cholesterol: 169 mg/dL (ref 0–200)
HDL: 46 mg/dL (ref >39)
LDL CALC: 104 mg/dL — AB (ref 0–99)
TRIGLYCERIDES: 96 mg/dL (ref <150)
VLDL: 19 mg/dL (ref 0–40)

## 2014-09-18 LAB — HEMOGLOBIN A1C
Hgb A1c MFr Bld: 5.5 % (ref <5.7)
Mean Plasma Glucose: 111 mg/dL (ref <117)

## 2014-09-18 MED ORDER — OLANZAPINE 5 MG PO TBDP
5.0000 mg | ORAL_TABLET | Freq: Every day | ORAL | Status: DC
  Administered 2014-09-19: 5 mg via ORAL
  Filled 2014-09-18 (×3): qty 1

## 2014-09-18 MED ORDER — OLANZAPINE 5 MG PO TBDP
15.0000 mg | ORAL_TABLET | Freq: Every day | ORAL | Status: DC
  Administered 2014-09-18: 15 mg via ORAL
  Filled 2014-09-18 (×3): qty 1

## 2014-09-18 NOTE — [facility] Group Notes (Signed)
[facility] LCSW Group Therapy  09/18/2014  1:05 PM  Type of Therapy:  Group therapy  Participation Level:  Active  Participation Quality:  Attentive  Affect:  Flat  Cognitive:  Oriented  Insight:  Limited  Engagement in Therapy:  Limited  Modes of Intervention:  Discussion, Socialization  Summary of Progress/Problems:  Chaplain was here to lead a group on themes of hope and courage.Declined to participate.  "I'm just listening today."  Jody [name], Jody [name] 09/18/2014 10:59 AM

## 2014-09-18 NOTE — Progress Notes (Signed)
Patient ID: Jody SpruceRosalind L [name], female   DOB: October 15, 1970, 43 y.o.   MRN: [id number] D: Client visible on the unit, interacts appropriately with staff and peers. Client reports "I had a good day, doctor say I'm going home tomorrow" "I'm glad I really miss my daughter, she's thirteen" "I got anxious just now, it happens when I get in a crowd" Client reports goal to participate in groups. [facility]. A: Writer provided emotional support, administered Vistaril 25 mg orally. Staff will maintain safety checks q15 min. R: client is safe on the unit, client attended group.

## 2014-09-18 NOTE — [facility] Group Notes (Signed)
Psychoeducational Group Note  Date:  09/18/2014 Time:  1005  Group Topic/Focus:  Relapse Prevention Planning:   The focus of this group is to define relapse and discuss the need for planning to combat relapse.  Participation Level: Did Not Attend  Participation Quality:  Not Applicable  Affect:  Not Applicable  Cognitive:  Not Applicable  Insight:  Not Applicable  Engagement in Group: Not Applicable  Additional Comments:  Pt didn't attend group   Gwenevere Ghazili, Ramia Sidney Patience 09/18/2014, 3:59 PM

## 2014-09-18 NOTE — [facility] Group Notes (Signed)
[facility]   09/18/2014 9:16 AM  Participation Quality:  Minimal  Mood/Affect:  Flat  Depression Rating:  3  Anxiety Rating:  2  Thoughts of Suicide:  No Will you contract for safety?   NA  Current AVH:  No  Plan for Discharge/Comments:  States mother will probably pick her up at d/c.  Todl her we would send her with 14 day supply of meds, but since her Dr appointment is not until mid-Jan., she will need to fill prescription.  She repeated this back to me to make sure she understood it.  Transportation Means: see above  Supports: mother, sister, daughter  Jody [name], Marshae Azam B

## 2014-09-18 NOTE — Progress Notes (Signed)
D: Patient denies SI/HI or AVH to this Clinical research associatewriter.  She is flat and cautious during our introduction and medication pass but asks appropriate questions. Pt. Is calm, cooperative and pleasant but forwards little on her own. She is doing well with staying out of her room and attending groups.   A: Patient given emotional support from RN. Patient encouraged to come to staff with concerns and/or questions. Patient's medication routine continued. Patient's orders and plan of care reviewed.   R: Patient remains appropriate and cooperative. Will continue to monitor patient q15 minutes for safety.

## 2014-09-18 NOTE — Progress Notes (Signed)
[facility]  09/18/2014 1:04 PM Jody [name]  MRN:  [id number] Subjective:  Patient states ,'I feel OK."  Objective: Patient seen and chart reviewed.Patient reports improvement in her symptoms ,denies any changes in her mood this AM. Patient was encouraged to take her medications and to remain complaint on it after discharge. Patient agrees with plan. Discussed changing the scheduled time of her Zyprexa to help with compliance. Patient denies SI/HI/AH/VH.  Patient denies any side effects. I have reviewed her vital signs and it is WNL.      Diagnosis:   DSM5: Primary Psychiatric Diagnosis: Major depressive disorder ,recurrent ,severe with psychosis   Secondary Psychiatric Diagnosis: Alcohol use disorder,severe Alcohol withdrawal without perceptual disturbances Noncompliance with medication.   Non Psychiatric Diagnosis: See pmh  Total Time spent with patient: 45 minutes   ADL's:  Intact  Sleep: Fair  Appetite:  Fair   Psychiatric Specialty Exam: Physical Exam  ROS  Blood pressure 110/78, pulse 102, temperature 98.6 F (37 C), temperature source Oral, resp. rate 20, height 5\' 4"  (1.626 m), weight 70.761 kg (156 lb), last menstrual period 09/01/2014, SpO2 100 %.Body mass index is 26.76 kg/(m^2).  General Appearance: Disheveled  Eye SolicitorContact::  Fair  Speech:  Normal Rate  Volume:  Decreased  Mood:  Anxious and Depressed improving  Affect:  Flat  Thought Process:  Irrelevant improving   Orientation:  Full (Time, Place, and Person)  Thought Content:  Paranoid Ideation improving ,denies AH today  Suicidal Thoughts:  No  Homicidal Thoughts:  No  Memory:  Immediate;   Fair Recent;   Fair Remote;   Fair  Judgement:  Fair  Insight:  Shallow  Psychomotor Activity:  Decreased  Concentration:  Fair  Recall:  FiservFair  Fund of Knowledge:Fair  Language: Fair  Akathisia:  No  Handed:  Right  AIMS (if indicated):     Assets:  Communication Skills Desire  for Improvement  Sleep:  Number of Hours: 6   Musculoskeletal: Strength & Muscle Tone: within normal limits Gait & Station: normal Patient leans: N/A  Current Medications: Current Facility-Administered Medications  Medication Dose Route Frequency Provider Last Rate Last Dose  . acetaminophen (TYLENOL) tablet 650 mg  650 mg Oral Q6H PRN Earney NavyJosephine C Onuoha, NP   650 mg at 09/16/14 0414  . alum & mag hydroxide-simeth (MAALOX/MYLANTA) 200-200-20 MG/5ML suspension 30 mL  30 mL Oral Q4H PRN Earney NavyJosephine C Onuoha, NP      . benztropine (COGENTIN) tablet 0.5 mg  0.5 mg Oral QHS Earney NavyJosephine C Onuoha, NP   0.5 mg at 09/17/14 2130  . busPIRone (BUSPAR) tablet 5 mg  5 mg Oral TID Jomarie LongsSaramma Shamon Lobo, MD   5 mg at 09/18/14 1200  . FLUoxetine (PROZAC) capsule 60 mg  60 mg Oral Daily Maida Widger, MD   60 mg at 09/18/14 [id number]  . hydrOXYzine (ATARAX/VISTARIL) tablet 25 mg  25 mg Oral Q4H PRN Earney NavyJosephine C Onuoha, NP   25 mg at 09/12/14 1842  . ibuprofen (ADVIL,MOTRIN) tablet 600 mg  600 mg Oral Q8H PRN Earney NavyJosephine C Onuoha, NP      . LORazepam (ATIVAN) tablet 1 mg  1 mg Oral Q8H PRN Earney NavyJosephine C Onuoha, NP   1 mg at 09/12/14 2113  . magnesium hydroxide (MILK OF MAGNESIA) suspension 30 mL  30 mL Oral Daily PRN Earney NavyJosephine C Onuoha, NP      . multivitamin with minerals tablet 1 tablet  1 tablet Oral Daily Jomarie LongsSaramma Roneshia Drew, MD  1 tablet at 09/18/14 [id number]  . nicotine (NICODERM CQ - dosed in mg/24 hours) patch 14 mg  14 mg Transdermal Daily Earney NavyJosephine C Onuoha, NP   14 mg at 09/18/14 [id number]  . OLANZapine zydis (ZYPREXA) disintegrating tablet 15 mg  15 mg Oral QHS Jazelle Achey, MD      . Melene Muller[START ON 09/19/2014] OLANZapine zydis (ZYPREXA) disintegrating tablet 5 mg  5 mg Oral Daily Eiliyah Reh, MD      . ondansetron (ZOFRAN) tablet 4 mg  4 mg Oral Q8H PRN Earney NavyJosephine C Onuoha, NP      . thiamine (VITAMIN B-1) tablet 100 mg  100 mg Oral Daily Jomarie LongsSaramma Jyasia Markoff, MD   100 mg at 09/18/14 [id number]  . zolpidem (AMBIEN) tablet 5 mg  5 mg Oral QHS  PRN Earney NavyJosephine C Onuoha, NP   5 mg at 09/17/14 2132    Lab Results:  Results for orders placed or performed during the hospital encounter of 09/12/14 (from the past 48 hour(s))  Hemoglobin A1c     Status: None   Collection Time: 09/18/14  6:34 AM  Result Value Ref Range   Hgb A1c MFr Bld 5.5 <5.7 %    Comment: (NOTE)                                                                       According to the ADA Clinical Practice Recommendations for 2011, when HbA1c is used as a screening test:  >=6.5%   Diagnostic of Diabetes Mellitus           (if abnormal result is confirmed) 5.7-6.4%   Increased risk of developing Diabetes Mellitus References:Diagnosis and Classification of Diabetes Mellitus,Diabetes Care,2011,34(Suppl 1):S62-S69 and Standards of Medical Care in         Diabetes - 2011,Diabetes Care,2011,34 (Suppl 1):S11-S61.    Mean Plasma Glucose 111 <117 mg/dL    Comment: Performed at [facility]  Lipid panel     Status: Abnormal   Collection Time: 09/18/14  6:34 AM  Result Value Ref Range   Cholesterol 169 0 - 200 mg/dL   Triglycerides 96 <811<150 mg/dL   HDL 46 >91>39 mg/dL   Total CHOL/HDL Ratio 3.7 RATIO   VLDL 19 0 - 40 mg/dL   LDL Cholesterol 478104 (H) 0 - 99 mg/dL    Comment:        Total Cholesterol/HDL:CHD Risk Coronary Heart Disease Risk Table                     Men   Women  1/2 Average Risk   3.4   3.3  Average Risk       5.0   4.4  2 X Average Risk   9.6   7.1  3 X Average Risk  23.4   11.0        Use the calculated Patient Ratio above and the CHD Risk Table to determine the patient's CHD Risk.        ATP III CLASSIFICATION (LDL):  <100     mg/dL   Optimal  295-621100-129  mg/dL   Near or Above                    Optimal  130-159  mg/dL   Borderline  409-811  mg/dL   High  >914     mg/dL   Very High Performed at [facility]   TSH     Status: None   Collection Time: 09/18/14  6:34 AM  Result Value Ref Range   TSH 1.620 0.350 - 4.500 uIU/mL     Comment: Performed at [facility]    Physical Findings: AIMS: Facial and Oral Movements Muscles of Facial Expression: None, normal Lips and Perioral Area: None, normal Jaw: None, normal Tongue: None, normal,Extremity Movements Upper (arms, wrists, hands, fingers): None, normal Lower (legs, knees, ankles, toes): None, normal, Trunk Movements Neck, shoulders, hips: None, normal, Overall Severity Severity of abnormal movements (highest score from questions above): None, normal Incapacitation due to abnormal movements: None, normal Patient's awareness of abnormal movements (rate only patient's report): No Awareness, Dental Status Current problems with teeth and/or dentures?: No Does patient usually wear dentures?: No  CIWA:  CIWA-Ar Total: 0 COWS:  COWS Total Score: 1  Treatment Plan Summary: Daily contact with patient to assess and evaluate symptoms and progress in treatment Medication management  Plan: Will continue Zyprexa zydis 15 mg po daily ,but will change the scheduled time for patient compliance. Continue Cogentin. Will continue Prozac  60 mg po daily. Will add Buspar 5 mg po tid. Will continue Ambien 5 mg po qhs prn for sleep.  TSH ,lipid panel ,Hb a1c -wnl. Will continue CIWA/Ativan protocol for alcohol use disorder. Patient to monitored for withdrawal symptoms as well as side effects.    CSW will work on disposition. Patient to be discharged tomorrow AM if she continues to be stable.   Medical Decision Making Problem Points:  Established problem, stable/improving (1), New problem, with no additional work-up planned (3), Review of last therapy session (1) and Review of psycho-social stressors (1) Data Points:  Review and summation of old records (2) Review of medication regiment & side effects (2)  I certify that inpatient services furnished can reasonably be expected to improve the patient's condition.   Lempi Edwin MD 09/18/2014, 1:04 PM

## 2014-09-18 NOTE — BHH Group Notes (Signed)
Adult Psychoeducational Group Note  Date:  09/18/2014 Time:  11:34 PM  Group Topic/Focus:  Wrap-Up Group:   The focus of this group is to help patients review their daily goal of treatment and discuss progress on daily workbooks.  Participation Level:  Minimal  Participation Quality:  Appropriate  Affect:  Flat  Cognitive:  Appropriate  Insight: Lacking  Engagement in Group:  Limited  Modes of Intervention:  Discussion  Additional Comments:  Jody SakaiRosalind stated her day was pretty good.  She stated she went to the gym and participated in groups.  She said she is discharging for home tomorrow.  Jody RancherLindsay, Jody [name] 09/18/2014, 11:34 PM

## 2014-09-18 NOTE — Progress Notes (Signed)
[facility] :  Will you be returning to the same living situation after discharge: Yes,  home At discharge, do you have transportation home?:Yes,  mother Do you have the ability to pay for your medications:Yes,  mental health  Release of information consent forms completed and in the chart;  Patient's signature needed at discharge.  Patient to Follow up at: Follow-up Information    Follow up with [facility] On 10/08/2014.   Why:  Thursday at 1:40 PM with Dr Kelvin CellarAfatooni  [facility]   Contact information:   [address]  [place]  [phone]] [phone]      Patient denies SI/HI:   Yes,  yes    Safety Planning and Suicide Prevention discussed:  Yes,  yes    Jody [name], Jody [name] 09/18/2014, 3:29 PM

## 2014-09-19 DIAGNOSIS — F333 Major depressive disorder, recurrent, severe with psychotic symptoms: Principal | ICD-10-CM

## 2014-09-19 DIAGNOSIS — F1010 Alcohol abuse, uncomplicated: Secondary | ICD-10-CM

## 2014-09-19 DIAGNOSIS — F1994 Other psychoactive substance use, unspecified with psychoactive substance-induced mood disorder: Secondary | ICD-10-CM

## 2014-09-19 MED ORDER — OLANZAPINE 5 MG PO TBDP
15.0000 mg | ORAL_TABLET | Freq: Every day | ORAL | Status: DC
  Filled 2014-09-19: qty 42

## 2014-09-19 MED ORDER — ADULT MULTIVITAMIN W/MINERALS CH: 1.0000 | ORAL_TABLET | Freq: Every day | ORAL | Status: DC

## 2014-09-19 MED ORDER — OLANZAPINE 15 MG PO TBDP: 15.0000 mg | ORAL_TABLET | Freq: Every day | ORAL | Status: DC

## 2014-09-19 MED ORDER — FLUOXETINE HCL 20 MG PO CAPS: 60.0000 mg | ORAL_CAPSULE | Freq: Every day | ORAL | Status: DC

## 2014-09-19 MED ORDER — BUSPIRONE HCL 5 MG PO TABS: 5.0000 mg | ORAL_TABLET | Freq: Three times a day (TID) | ORAL | Status: DC

## 2014-09-19 MED ORDER — OLANZAPINE 5 MG PO TBDP: 5.0000 mg | ORAL_TABLET | Freq: Every day | ORAL | Status: DC

## 2014-09-19 MED ORDER — BENZTROPINE MESYLATE 0.5 MG PO TABS: 0.5000 mg | ORAL_TABLET | Freq: Every day | ORAL | Status: DC

## 2014-09-19 NOTE — Plan of Care (Signed)
Problem: Alteration in thought process Goal: STG-Patient is able to discuss thoughts with staff Outcome: Completed/Met Date Met:  09/19/14 Client able to discuss trigger that causes anxiety, also how to remove her self from crowds to avoid anxiety. "when I get around crowds I get anxious, a little over anxious"

## 2014-09-19 NOTE — B[facility] Group Notes (Signed)
[facility] LCSW Group Therapy Note  09/19/2014 11:15 AM   Type of Therapy and Topic:  Group Therapy: Avoiding Self-Sabotaging and Enabling Behaviors  Participation Level:  Did Not Attend; patient invited to participate before start of group.    Carney Bernatherine C Harrill, LCSW

## 2014-09-19 NOTE — Plan of Care (Signed)
Problem: Alteration in mood; excessive anxiety as evidenced by: Goal: STG-Pt will report an absence of self-harm thoughts/actions (Patient will report an absence of self-harm thoughts or actions)  Outcome: Completed/Met Date Met:  09/19/14 Client denies suicidal ideations, know to report to staff with harmful thoughts, and report good support system at home in mom.

## 2014-09-19 NOTE — Discharge Summary (Signed)
Physician Discharge Summary Note  Patient:  Jody SpruceRosalind L [name] is an 43 y.o., female MRN:  [id number] DOB:  1971-02-22 Patient phone:  [phone] (home)  Patient address:   [address] [place] [address] [place] [id number],  Total Time spent with patient: Greater than 30 minutes  Date of Admission:  09/12/2014 Date of Discharge: 09/19/14  Reason for Admission: Mood stabilization treatments  Discharge Diagnoses: Active Problems:   Severe major depression with psychotic features   Psychosis   Alcohol abuse  Psychiatric Specialty Exam: Physical Exam  Psychiatric: Her speech is normal and behavior is normal. Judgment and thought content normal. Her mood appears not anxious. Her affect is not angry, not blunt, not labile and not inappropriate. Cognition and memory are normal. She does not exhibit a depressed mood.    Review of Systems  Constitutional: Negative.   HENT: Negative.   Eyes: Negative.   Respiratory: Negative.   Cardiovascular: Negative.   Gastrointestinal: Negative.   Genitourinary: Negative.   Musculoskeletal: Negative.   Skin: Negative.   Neurological: Negative.   Endo/Heme/Allergies: Negative.   Psychiatric/Behavioral: Positive for depression (Stable) and hallucinations (Hx of). Negative for suicidal ideas, memory loss and substance abuse. The patient has insomnia (Stable). The patient is not nervous/anxious.     Blood pressure 138/94, pulse 101, temperature 98.5 F (36.9 C), temperature source Oral, resp. rate 20, height 5\' 4"  (1.626 m), weight 70.761 kg (156 lb), last menstrual period 09/01/2014, SpO2 100 %.Body mass index is 26.76 kg/(m^2).   See Physician SRA     Past Psychiatric History: Yes Diagnosis: Severe major depression with psychotic features  [facility]  Outpatient Care:  [facility]  Substance Abuse Care: See H&P  Self-Mutilation: See H&P  Suicidal Attempts: See H&P  Violent Behaviors: NA   Musculoskeletal: Strength & Muscle  Tone: within normal limits Gait & Station: normal Patient leans: N/A   Diagnosis: DSM 5 AXIS I: Major Depression with psychotic features, Alcohol Abuse, Substance Induced Mood Disorder AXIS II: No diagnosis AXIS III:  Past Medical History  Diagnosis Date  . Cystitis, interstitial   . Anxiety and depression   . Panic attack   . Schizophrenia    AXIS IV: other psychosocial or environmental problems AXIS V: 61-70 mild symptoms  Level of Care:  OP  Hospital Course:   [name] is a 43 yo female who [facility] reports that she hears voices telling her to run down street into her neighbor's yard. She reports frequently experiencing AVH, panic attacks. Although when questioned, she does deny SI/HI/AVH. Since job loss this past June, the voices worsened and also her depression. Patient states that God tells her what to do. She is paranoid about food being poisoned. She was agreeable to having one of the staff RN's to get her food. "God told me that I can trust you to get my food". She is non communicative and has long pauses in between responses. She is not making eye contact but mostly closes her eyes or looks out at the window.          Jody Spruceosalind L [name] was admitted to the adult unit where she was evaluated and her symptoms were identified. Medication management was discussed and implemented. Her Prozac was increased to 60 mg daily for symptoms of depression. Patient was started on Buspar 5 mg TID for symptoms of anxiety. She was started on Zyprexa for her psychotic symptoms. By the time of her discharge  the patient was taking 5 mg in the morning and 15 mg at bedtime.  She was encouraged to participate in unit programming. Medical problems were identified and treated appropriately. Home medication was restarted as needed.  She was evaluated each day by a clinical provider to ascertain the patient's response to treatment.  Improvement was  noted by the patient's report of decreasing symptoms, improved sleep and appetite, affect, medication tolerance, behavior, and participation in unit programming.  The patient was asked each day to complete a self inventory noting mood, mental status, pain, new symptoms, anxiety and concerns.         She responded well to medication and being in a therapeutic and supportive environment. Her level of functioning improved greatly after medication changes were made. The patient no longer expressed concern about her food being poisoned. Positive and appropriate behavior was noted and the patient was motivated for recovery. Nursing staff reported that the patient needed a great deal of encouragement to take her psychiatric medications during admission.  She worked closely with the treatment team and case manager to develop a discharge plan with appropriate goals. Coping skills, problem solving as well as relaxation therapies were also part of the unit programming.         By the day of discharge she was in much improved condition than upon admission. Patient agreed to be compliant with her medications after discharge. Symptoms were reported as significantly decreased or resolved completely. The patient denied SI/HI and voiced no AVH. She was motivated to continue taking medication with a goal of continued improvement in mental health.  Jody [name] was discharged home with a plan to follow up as noted below. Patient was provided with prescriptions and medication samples at time of discharge.  Consults:  psychiatry  Significant Diagnostic Studies:  labs: CBC with diff, CMP, UDS, toxicology tests, U/A  Discharge Vitals:   Blood pressure 138/94, pulse 101, temperature 98.5 F (36.9 C), temperature source Oral, resp. rate 20, height 5\' 4"  (1.626 m), weight 70.761 kg (156 lb), last menstrual period 09/01/2014, SpO2 100 %. Body mass index is 26.76 kg/(m^2). Lab Results:   Results for orders placed or performed  during the hospital encounter of 09/12/14 (from the past 72 hour(s))  Hemoglobin A1c     Status: None   Collection Time: 09/18/14  6:34 AM  Result Value Ref Range   Hgb A1c MFr Bld 5.5 <5.7 %    Comment: (NOTE)                                                                       According to the ADA Clinical Practice Recommendations for 2011, when HbA1c is used as a screening test:  >=6.5%   Diagnostic of Diabetes Mellitus           (if abnormal result is confirmed) 5.7-6.4%   Increased risk of developing Diabetes Mellitus References:Diagnosis and Classification of Diabetes Mellitus,Diabetes Care,2011,34(Suppl 1):S62-S69 and Standards of Medical Care in         Diabetes - 2011,Diabetes Care,2011,34 (Suppl 1):S11-S61.    Mean Plasma Glucose 111 <117 mg/dL    Comment: Performed at [facility]  Lipid panel     Status: Abnormal   Collection  Time: 09/18/14  6:34 AM  Result Value Ref Range   Cholesterol 169 0 - 200 mg/dL   Triglycerides 96 <696<150 mg/dL   HDL 46 >29>39 mg/dL   Total CHOL/HDL Ratio 3.7 RATIO   VLDL 19 0 - 40 mg/dL   LDL Cholesterol 528104 (H) 0 - 99 mg/dL    Comment:        Total Cholesterol/HDL:CHD Risk Coronary Heart Disease Risk Table                     Men   Women  1/2 Average Risk   3.4   3.3  Average Risk       5.0   4.4  2 X Average Risk   9.6   7.1  3 X Average Risk  23.4   11.0        Use the calculated Patient Ratio above and the CHD Risk Table to determine the patient's CHD Risk.        ATP III CLASSIFICATION (LDL):  <100     mg/dL   Optimal  413-244100-129  mg/dL   Near or Above                    Optimal  130-159  mg/dL   Borderline  010-272160-189  mg/dL   High  >536>190     mg/dL   Very High [facility]formed [facility]   TSH     Status: None   Collection Time: 09/18/14  6:34 AM  Result Value Ref Range   TSH 1.620 0.350 - 4.500 uIU/mL    Comment: [facility]formed [facility]    Physical Findings: AIMS: Facial and Oral Movements Muscles of  Facial Expression: None, normal Lips and Perioral Area: None, normal Jaw: None, normal Tongue: None, normal,Extremity Movements Upper (arms, wrists, hands, fingers): None, normal Lower (legs, knees, ankles, toes): None, normal, Trunk Movements Neck, shoulders, hips: None, normal, Overall Severity Severity of abnormal movements (highest score from questions above): None, normal Incapacitation due to abnormal movements: None, normal Patient's awareness of abnormal movements (rate only patient's report): No Awareness, Dental Status Current problems with teeth and/or dentures?: No Does patient usually wear dentures?: No  CIWA:  CIWA-Ar Total: 0 COWS:  COWS Total Score: 1  Psychiatric Specialty Exam: See Psychiatric Specialty Exam and Suicide Risk Assessment completed by Attending Physician prior to discharge.  Discharge destination:  Home  Is patient on multiple antipsychotic therapies at discharge:  No   Has Patient had three or more failed trials of antipsychotic monotherapy by history:  No  Recommended Plan for Multiple Antipsychotic Therapies: NA    Medication List    STOP taking these medications        haloperidol 5 MG tablet  Commonly known as:  HALDOL      TAKE these medications      Indication   benztropine 0.5 MG tablet  Commonly known as:  COGENTIN  Take 1 tablet (0.5 mg total) by mouth at bedtime.   Indication:  Extrapyramidal Reaction caused by Medications     busPIRone 5 MG tablet  Commonly known as:  BUSPAR  Take 1 tablet (5 mg total) by mouth 3 (three) times daily.   Indication:  Anxiety Disorder     FLUoxetine 20 MG capsule  Commonly known as:  PROZAC  Take 3 capsules (60 mg total) by mouth daily.   Indication:  Major Depressive Disorder     hydrOXYzine 25 MG  tablet  Commonly known as:  ATARAX/VISTARIL  Take 1 tablet (25 mg total) by mouth every 4 (four) hours as needed for anxiety (Sleep).   Indication:  Tension, Anxiety     multivitamin with  minerals Tabs tablet  Take 1 tablet by mouth daily.   Indication:  Vitamin Supplementation     olanzapine zydis 15 MG disintegrating tablet  Commonly known as:  ZYPREXA  Take 1 tablet (15 mg total) by mouth at bedtime.   Indication:  Major Depressive Disorder, Psychosis     OLANZapine zydis 5 MG disintegrating tablet  Commonly known as:  ZYPREXA  Take 1 tablet (5 mg total) by mouth daily.   Indication:  Major Depressive Disorder, Psychosis     zolpidem 5 MG tablet  Commonly known as:  AMBIEN  Take 1 tablet (5 mg total) by mouth at bedtime. For insomnia   Indication:  Trouble Sleeping       Follow-up Information    Follow up with [facility] On 10/08/2014.   Why:  Thursday at 1:40 PM with Dr Kelvin Cellar  Before this appointment, go to [facility] pharmacy to get your prescriptions filled   Contact information:   [address]  [place]  [336] [phone]     Follow-up recommendations:  Activity:  As tolerated Diet: As recommended by your primary care doctor. Keep all scheduled follow-up appointments as recommended.  Comments: Take all your medications as prescribed by your mental healthcare provider. Report any adverse effects and or reactions from your medicines to your outpatient provider promptly. Patient is instructed and cautioned to not engage in alcohol and or illegal drug use while on prescription medicines. In the event of worsening symptoms, patient is instructed to call the crisis hotline, 911 and or go to the nearest ED for appropriate evaluation and treatment of symptoms. Follow-up with your primary care provider for your other medical issues, concerns and or health care needs.   Total Discharge Time:  Greater than 30 minutes.  SignedFransisca Kaufmann, NP-C 09/19/2014, 5:11 PM I personally assessed the patient and formulated the plan Madie Reno A. Dub Mikes, M.D.

## 2014-09-19 NOTE — [facility] Suicide Risk Assessment (Signed)
Suicide Risk Assessment  Discharge Assessment     Demographic Factors:  NA  Total Time spent with patient: 30 minutes  Psychiatric Specialty Exam:     Blood pressure 138/94, pulse 101, temperature 98.5 F (36.9 C), temperature source Oral, resp. rate 20, height 5\' 4"  (1.626 m), weight 70.761 kg (156 lb), last menstrual period 09/01/2014, SpO2 100 %.Body mass index is 26.76 kg/(m^2).  General Appearance: Fairly Groomed  Patent attorneyye Contact::  Fair  Speech:  Clear and Coherent  Volume:  Normal  Mood:  worried  Affect:  worried  Thought Process:  Coherent and Goal Directed  Orientation:  Full (Time, Place, and Person)  Thought Content:  plans as she moves on  Suicidal Thoughts:  No  Homicidal Thoughts:  No  Memory:  Immediate;   Fair Recent;   Fair Remote;   Fair  Judgement:  Fair  Insight:  Present  Psychomotor Activity:  Normal  Concentration:  Fair  Recall:  FiservFair  Fund of Knowledge:Fair  Language: Fair  Akathisia:  No  Handed:    AIMS (if indicated):     Assets:  Desire for Improvement Housing Social Support  Sleep:  Number of Hours: 6.75    Musculoskeletal: Strength & Muscle Tone: within normal limits Gait & Station: normal Patient leans: N/A   Mental Status Per Nursing Assessment::   On Admission:     Current Mental Status by Physician: In full contact with reality. There are no active psychotic symptoms no SI plans or intent. She is planning to go back home with her mother. The main concern is finding a job but she is not going to pursue this to after Christmas. She states she does not have the money to get what her daughter wants for Christmas but states she will find a way of dealing with it. She is committed to abstinence  Loss Factors: Financial problems/change in socioeconomic status  Historical Factors: NA  Risk Reduction Factors:   Sense of responsibility to family, Living with another person, especially a relative and Positive social  support  Continued Clinical Symptoms:  Depression:   Comorbid alcohol abuse/dependence  Cognitive Features That Contribute To Risk:  Closed-mindedness Polarized thinking Thought constriction (tunnel vision)    Suicide Risk:  Minimal: No identifiable suicidal ideation.  Patients presenting with no risk factors but with morbid ruminations; may be classified as minimal risk based on the severity of the depressive symptoms  Discharge Diagnoses:   AXIS I:  Major Depression with psychotic features, Alcohol Abuse, Substance Induced Mood Disorder AXIS II:  No diagnosis AXIS III:   Past Medical History  Diagnosis Date  . Cystitis, interstitial   . Anxiety and depression   . Panic attack   . Schizophrenia    AXIS IV:  other psychosocial or environmental problems AXIS V:  61-70 mild symptoms  Plan Of Care/Follow-up recommendations:  Activity:  as tolerated Diet:  regular Follow up outpatient basis Is patient on multiple antipsychotic therapies at discharge:  No   Has Patient had three or more failed trials of antipsychotic monotherapy by history:  No  Recommended Plan for Multiple Antipsychotic Therapies: NA    Uma Jerde A 09/19/2014, 12:17 PM

## 2014-09-19 NOTE — Progress Notes (Signed)
Patient ID: Jody SpruceRosalind L Woodrick, female   DOB: Apr 05, 1971, 43 y.o.   MRN: [id number]  D: Pt currently presents with a blunted affect and anxious behavior. Per self inventory, pt rates depression at a 0, hopelessness 0 and anxiety 4. Pt's daily goal is to "staying focus and take my meds." Pt reports good sleep, concentration and appetite.    A: Pt provided with medications per providers orders. Pt's labs and vitals were monitored throughout the day. Pt supported emotionally and encouraged to express concerns and questions. Pt consulted with provider and Clinical research associatewriter. Pt educated on stress managment.   R: Pt's safety ensured with 15 minute and environmental checks. Pt currently denies SI/HI and A/V hallucinations. Pt verbally agrees to seek staff if SI/HI or A/VH occurs and to consult with staff before acting on these thoughts. Pt to be discharged today.    Aurora Maskwyman, Ariday Brinker E, RN

## 2014-09-19 NOTE — Progress Notes (Signed)
Patient ID: [name], female   DOB: 1971-02-03, 43 y.o.   MRN: [id number]  Pt discharged home with her mother. Pt was stable and smiling. All papers and prescriptions were given and valuables returned. Verbal understanding expressed. Denies SI/HI and A/VH. Pt able to express concerns and ask questions. Pt able to verbalize coping strategies.

## 2014-09-22 ENCOUNTER — Ambulatory Visit: Payer: Medicaid Other | Attending: Internal Medicine | Admitting: Internal Medicine

## 2014-09-22 ENCOUNTER — Ambulatory Visit (HOSPITAL_BASED_OUTPATIENT_CLINIC_OR_DEPARTMENT_OTHER): Payer: PRIVATE HEALTH INSURANCE

## 2014-09-22 ENCOUNTER — Encounter: Payer: Self-pay | Admitting: Internal Medicine

## 2014-09-22 VITALS — BP 139/90 | HR 80 | Temp 98.0°F | Resp 16 | Wt 161.8 lb

## 2014-09-22 DIAGNOSIS — Z23 Encounter for immunization: Secondary | ICD-10-CM

## 2014-09-22 DIAGNOSIS — F333 Major depressive disorder, recurrent, severe with psychotic symptoms: Secondary | ICD-10-CM

## 2014-09-22 DIAGNOSIS — R030 Elevated blood-pressure reading, without diagnosis of hypertension: Secondary | ICD-10-CM | POA: Diagnosis not present

## 2014-09-22 DIAGNOSIS — F1010 Alcohol abuse, uncomplicated: Secondary | ICD-10-CM | POA: Insufficient documentation

## 2014-09-22 DIAGNOSIS — Z87891 Personal history of nicotine dependence: Secondary | ICD-10-CM | POA: Diagnosis not present

## 2014-09-22 DIAGNOSIS — Z0000 Encounter for general adult medical examination without abnormal findings: Secondary | ICD-10-CM

## 2014-09-22 DIAGNOSIS — F419 Anxiety disorder, unspecified: Secondary | ICD-10-CM | POA: Insufficient documentation

## 2014-09-22 DIAGNOSIS — IMO0001 Reserved for inherently not codable concepts without codable children: Secondary | ICD-10-CM

## 2014-09-22 DIAGNOSIS — F209 Schizophrenia, unspecified: Secondary | ICD-10-CM | POA: Insufficient documentation

## 2014-09-22 DIAGNOSIS — Z1231 Encounter for screening mammogram for malignant neoplasm of breast: Secondary | ICD-10-CM

## 2014-09-22 NOTE — Progress Notes (Signed)
Patient Demographics  Jody [name], is a 43 y.o. female  XBJ:[id number]:[id number]  HYQ:[id number]  DOB - 19-Mar-1971  CC:  Chief Complaint  Patient presents with  . Establish Care       HPI: Jody [name] is a 43 y.o. female here today to establish medical care and needs annual physical examination.patient has history of severe depression with psychotic features, schizophrenia, history of tobacco abuse alcohol abuse, patient had recent hospitalization for mental health issues, her medications were optimized, she currently denies any SI or HI and already has scheduled followup with behavior health, she had a blood work done which was reviewed with the patient. Patient has No headache, No chest pain, No abdominal pain - No Nausea, No new weakness tingling or numbness, No Cough - SOB.  Allergies  Allergen Reactions  . Penicillins Itching and Rash   Past Medical History  Diagnosis Date  . Cystitis, interstitial   . Anxiety and depression   . Panic attack   . Schizophrenia    Current Outpatient Prescriptions on File Prior to Visit  Medication Sig Dispense Refill  . benztropine (COGENTIN) 0.5 MG tablet Take 1 tablet (0.5 mg total) by mouth at bedtime. 30 tablet 0  . busPIRone (BUSPAR) 5 MG tablet Take 1 tablet (5 mg total) by mouth 3 (three) times daily. 90 tablet 0  . FLUoxetine (PROZAC) 20 MG capsule Take 3 capsules (60 mg total) by mouth daily. 90 capsule 0  . hydrOXYzine (ATARAX/VISTARIL) 25 MG tablet Take 1 tablet (25 mg total) by mouth every 4 (four) hours as needed for anxiety (Sleep). 30 tablet 0  . Multiple Vitamin (MULTIVITAMIN WITH MINERALS) TABS tablet Take 1 tablet by mouth daily.    Marland Kitchen. OLANZapine zydis (ZYPREXA) 15 MG disintegrating tablet Take 1 tablet (15 mg total) by mouth at bedtime. 30 tablet 0  . OLANZapine zydis (ZYPREXA) 5 MG disintegrating tablet Take 1 tablet (5 mg total) by mouth daily. 30 tablet 0  . zolpidem (AMBIEN) 5 MG tablet Take 1 tablet (5 mg total) by  mouth at bedtime. For insomnia 5 tablet 0  . [DISCONTINUED] solifenacin (VESICARE) 5 MG tablet Take 5 mg by mouth daily.     No current facility-administered medications on file prior to visit.   Family History  Problem Relation Age of Onset  . Diabetes Father   . Hypertension Mother   . Hypertension Sister   . Lupus Sister   . Heart disease Maternal Grandfather   . Lupus     History   Social History  . Marital Status: Single    Spouse Name: N/A    Number of Children: N/A  . Years of Education: N/A   Occupational History  . Not on file.   Social History Main Topics  . Smoking status: Current Every Day Smoker -- 0.25 packs/day for 4 years    Types: Cigarettes  . Smokeless tobacco: Not on file  . Alcohol Use: 3.6 oz/week    6 Cans of beer per week     Comment: reports drinking throughout the day "Beer"  . Drug Use: Not on file  . Sexual Activity: Not on file   Other Topics Concern  . Not on file   Social History Narrative    Review of Systems: Constitutional: Negative for fever, chills, diaphoresis, activity change, appetite change and fatigue. HENT: Negative for ear pain, nosebleeds, congestion, facial swelling, rhinorrhea, neck pain, neck stiffness and ear discharge.  Eyes: Negative for pain, discharge, redness, itching  and visual disturbance. Respiratory: Negative for cough, choking, chest tightness, shortness of breath, wheezing and stridor.  Cardiovascular: Negative for chest pain, palpitations and leg swelling. Gastrointestinal: Negative for abdominal distention. Genitourinary: Negative for dysuria, urgency, frequency, hematuria, flank pain, decreased urine volume, difficulty urinating and dyspareunia.  Musculoskeletal: Negative for back pain, joint swelling, arthralgia and gait problem. Neurological: Negative for dizziness, tremors, seizures, syncope, facial asymmetry, speech difficulty, weakness, light-headedness, numbness and headaches.  Hematological:  Negative for adenopathy. Does not bruise/bleed easily. Psychiatric/Behavioral: Negative for hallucinations, behavioral problems, confusion, dysphoric mood, decreased concentration and agitation.    Objective:   Filed Vitals:   09/22/14 1506  BP: 139/90  Pulse: 80  Temp: 98 F (36.7 C)  Resp: 16    Physical Exam: Constitutional: Patient appears well-developed and well-nourished. No distress. HENT: Normocephalic, atraumatic, External right and left ear normal. Oropharynx is clear and moist.  Eyes: Conjunctivae and EOM are normal. PERRLA, no scleral icterus. Neck: Normal ROM. Neck supple. No JVD. No tracheal deviation. No thyromegaly. CVS: RRR, S1/S2 +, no murmurs, no gallops, no carotid bruit.  Pulmonary: Effort and breath sounds normal, no stridor, rhonchi, wheezes, rales.  Abdominal: Soft. BS +, no distension, tenderness, rebound or guarding.  Musculoskeletal: Normal range of motion. No edema and no tenderness.  Neuro: Alert. Normal reflexes, muscle tone coordination. No cranial nerve deficit. Skin: Skin is warm and dry. No rash noted. Not diaphoretic. No erythema. No pallor. Psychiatric: Normal mood and affect. Behavior, judgment, thought content normal.  Lab Results  Component Value Date   WBC 3.4* 09/12/2014   HGB 13.2 09/12/2014   HCT 42.2 09/12/2014   MCV 75.4* 09/12/2014   PLT 288 09/12/2014   Lab Results  Component Value Date   CREATININE 0.76 09/12/2014   BUN 4* 09/12/2014   NA 140 09/12/2014   K 4.0 09/12/2014   CL 99 09/12/2014   CO2 26 09/12/2014    Lab Results  Component Value Date   HGBA1C 5.5 09/18/2014   Lipid Panel     Component Value Date/Time   CHOL 169 09/18/2014 0634   TRIG 96 09/18/2014 0634   HDL 46 09/18/2014 0634   CHOLHDL 3.7 09/18/2014 0634   VLDL 19 09/18/2014 0634   LDLCALC 104* 09/18/2014 0634       Assessment and plan:   1. Severe recurrent major depressive disorder with psychotic features Currently following up with  mental health symptoms are stable.  2. Annual physical exam Annual physical examination done today, she'll follow with her we can for gynecological examination, recent blood work reviewed with the patient.  3. Elevated BP Advised patient for DASH diet.   5. Encounter for screening mammogram for breast cancer  - MM DIGITAL SCREENING BILATERAL; Future  6. History of tobacco abuse As per patient for the last one week she has stopped smoking.     Health Maintenance  -Pap Smear: patient to follow with her GYN  -Vaccinations:  Patient declined flu shot.  Return in about 3 months (around 12/22/2014), or if symptoms worsen or fail to improve.  Doris CheadleADVANI, Olney Monier, MD

## 2014-09-22 NOTE — Progress Notes (Signed)
Patient declined the flu vaccine after her initial agreement to have it

## 2014-09-22 NOTE — Progress Notes (Signed)
Patient here to establish care and to get a pyhsical Currently follows up with [facility]

## 2014-09-22 NOTE — Patient Instructions (Signed)
DASH Eating Plan °DASH stands for "Dietary Approaches to Stop Hypertension." The DASH eating plan is a healthy eating plan that has been shown to reduce high blood pressure (hypertension). Additional health benefits may include reducing the risk of type 2 diabetes mellitus, heart disease, and stroke. The DASH eating plan may also help with weight loss. °WHAT DO I NEED TO KNOW ABOUT THE DASH EATING PLAN? °For the DASH eating plan, you will follow these general guidelines: °· Choose foods with a percent daily value for sodium of less than 5% (as listed on the food label). °· Use salt-free seasonings or herbs instead of table salt or sea salt. °· Check with your health care provider or pharmacist before using salt substitutes. °· Eat lower-sodium products, often labeled as "lower sodium" or "no salt added." °· Eat fresh foods. °· Eat more vegetables, fruits, and low-fat dairy products. °· Choose whole grains. Look for the word "whole" as the first word in the ingredient list. °· Choose fish and skinless chicken or turkey more often than red meat. Limit fish, poultry, and meat to 6 oz (170 g) each day. °· Limit sweets, desserts, sugars, and sugary drinks. °· Choose heart-healthy fats. °· Limit cheese to 1 oz (28 g) per day. °· Eat more home-cooked food and less restaurant, buffet, and fast food. °· Limit fried foods. °· Cook foods using methods other than frying. °· Limit canned vegetables. If you do use them, rinse them well to decrease the sodium. °· When eating at a restaurant, ask that your food be prepared with less salt, or no salt if possible. °WHAT FOODS CAN I EAT? °Seek help from a dietitian for individual calorie needs. °Grains °Whole grain or whole wheat bread. Brown rice. Whole grain or whole wheat pasta. Quinoa, bulgur, and whole grain cereals. Low-sodium cereals. Corn or whole wheat flour tortillas. Whole grain cornbread. Whole grain crackers. Low-sodium crackers. °Vegetables °Fresh or frozen vegetables  (raw, steamed, roasted, or grilled). Low-sodium or reduced-sodium tomato and vegetable juices. Low-sodium or reduced-sodium tomato sauce and paste. Low-sodium or reduced-sodium canned vegetables.  °Fruits °All fresh, canned (in natural juice), or frozen fruits. °Meat and Other Protein Products °Ground beef (85% or leaner), grass-fed beef, or beef trimmed of fat. Skinless chicken or turkey. Ground chicken or turkey. Pork trimmed of fat. All fish and seafood. Eggs. Dried beans, peas, or lentils. Unsalted nuts and seeds. Unsalted canned beans. °Dairy °Low-fat dairy products, such as skim or 1% milk, 2% or reduced-fat cheeses, low-fat ricotta or cottage cheese, or plain low-fat yogurt. Low-sodium or reduced-sodium cheeses. °Fats and Oils °Tub margarines without trans fats. Light or reduced-fat mayonnaise and salad dressings (reduced sodium). Avocado. Safflower, olive, or canola oils. Natural peanut or almond butter. °Other °Unsalted popcorn and pretzels. °The items listed above may not be a complete list of recommended foods or beverages. Contact your dietitian for more options. °WHAT FOODS ARE NOT RECOMMENDED? °Grains °White bread. White pasta. White rice. Refined cornbread. Bagels and croissants. Crackers that contain trans fat. °Vegetables °Creamed or fried vegetables. Vegetables in a cheese sauce. Regular canned vegetables. Regular canned tomato sauce and paste. Regular tomato and vegetable juices. °Fruits °Dried fruits. Canned fruit in light or heavy syrup. Fruit juice. °Meat and Other Protein Products °Fatty cuts of meat. Ribs, chicken wings, bacon, sausage, bologna, salami, chitterlings, fatback, hot dogs, bratwurst, and packaged luncheon meats. Salted nuts and seeds. Canned beans with salt. °Dairy °Whole or 2% milk, cream, half-and-half, and cream cheese. Whole-fat or sweetened yogurt. Full-fat   cheeses or blue cheese. Nondairy creamers and whipped toppings. Processed cheese, cheese spreads, or cheese  curds. °Condiments °Onion and garlic salt, seasoned salt, table salt, and sea salt. Canned and packaged gravies. Worcestershire sauce. Tartar sauce. Barbecue sauce. Teriyaki sauce. Soy sauce, including reduced sodium. Steak sauce. Fish sauce. Oyster sauce. Cocktail sauce. Horseradish. Ketchup and mustard. Meat flavorings and tenderizers. Bouillon cubes. Hot sauce. Tabasco sauce. Marinades. Taco seasonings. Relishes. °Fats and Oils °Butter, stick margarine, lard, shortening, ghee, and bacon fat. Coconut, palm kernel, or palm oils. Regular salad dressings. °Other °Pickles and olives. Salted popcorn and pretzels. °The items listed above may not be a complete list of foods and beverages to avoid. Contact your dietitian for more information. °WHERE CAN I FIND MORE INFORMATION? °National Heart, Lung, and [employer] Institute: www.nhlbi.nih.gov/health/health-topics/topics/dash/ °Document Released: 09/07/2011 Document Revised: 02/02/2014 Document Reviewed: 07/23/2013 °ExitCare® Patient Information ©2015 [employer]. This information is not intended to replace advice given to you by your health care provider. Make sure you discuss any questions you have with your health care provider. ° °

## 2014-09-22 NOTE — Progress Notes (Signed)
Patient Discharge Instructions:  After Visit Summary (AVS):   Faxed to:  09/22/14 Discharge Summary Note:   Faxed to:  09/22/14 Psychiatric Admission Assessment Note:   Faxed to:  09/22/14 Suicide Risk Assessment - Discharge Assessment:   Faxed to:  09/22/14 Faxed/Sent to the Next Level Care provider:  09/22/14 [facility] @ [phone]  Jerelene ReddenSheena E Sherando, 09/22/2014, 4:03 PM

## 2014-10-01 ENCOUNTER — Emergency Department (INDEPENDENT_AMBULATORY_CARE_PROVIDER_SITE_OTHER)
Admission: EM | Admit: 2014-10-01 | Discharge: 2014-10-01 | Disposition: A | Discharge status: Home / Self Care | Payer: Self-pay | Source: Home / Self Care | Attending: Emergency Medicine | Admitting: Emergency Medicine

## 2014-10-01 ENCOUNTER — Encounter (HOSPITAL_COMMUNITY): Payer: Self-pay | Admitting: Emergency Medicine

## 2014-10-01 DIAGNOSIS — F29 Unspecified psychosis not due to a substance or known physiological condition: Secondary | ICD-10-CM

## 2014-10-01 MED ORDER — BUSPIRONE HCL 5 MG PO TABS: 5.0000 mg | ORAL_TABLET | Freq: Three times a day (TID) | ORAL | Status: DC

## 2014-10-01 MED ORDER — BENZTROPINE MESYLATE 0.5 MG PO TABS: ORAL_TABLET | ORAL | Status: DC

## 2014-10-01 MED ORDER — FLUOXETINE HCL 20 MG PO TABS: ORAL_TABLET | ORAL | Status: DC

## 2014-10-01 MED ORDER — OLANZAPINE 5 MG PO TBDP: ORAL_TABLET | ORAL | Status: DC

## 2014-10-01 MED ORDER — HYDROXYZINE HCL 25 MG PO TABS: ORAL_TABLET | ORAL | Status: DC

## 2014-10-01 MED ORDER — ZOLPIDEM TARTRATE 5 MG PO TABS: 5.0000 mg | ORAL_TABLET | Freq: Every evening | ORAL | Status: DC | PRN

## 2014-10-01 MED ORDER — LORAZEPAM 2 MG/ML IJ SOLN: 1.0000 mg | Freq: Once | INTRAMUSCULAR | Status: DC

## 2014-10-01 NOTE — ED Provider Notes (Signed)
   Chief Complaint   Medication Refill   History of Present Illness   Jody [name] is a 43 year old female with psychosis who presents for refills on medications. [facility] She's run out of everything and needs enough to get into the weekend, [facility]. She denies any hallucinations or agitation. She's been mildly depressed and anxious. She denies any suicidal or homicidal ideation. She denies any use of alcohol or recreational drugs.  Review of Systems   Other than as noted above, the patient denies any of the following symptoms: Systemic:  No fever, chills, fatigue, weight loss or gain. Resp:  No shortness of breath. Cardiovasc:  No chest pain, palpitations, dizziness, or syncope. GI:  No abdominal pain, nausea, vomiting, anorexia, diarrhea, or constipation. Neuro:  No headache, paresthesias, or tremor. Psych:  No sadness, depression, crying, anxiety, panic, sleep disturbance, or suicidal or homicidal ideation.  No hallucinations or delusions.  PMFSH   Past medical history, family history, social history, meds, and allergies were reviewed.  Current meds include Cogentin, Prozac, hydroxyzine, Ambien, BuSpar, and Zyprexa. She is allergic to penicillin otherwise in good health.  Physical Examination     Vital signs:  BP 133/90 mmHg  Pulse 76  Temp(Src) 98 F (36.7 C)  SpO2 100%  LMP 09/01/2014 Gen:  Alert, oriented, in no distress. Lungs:  No respiratory distress.  Breath sounds clear and equal bilaterally.  No wheezes, rales, or rhonchi. Heart:  Regular rthythm.  No gallops, murmers, clicks or rubs. Abdomen:  Soft, flat and nontender.  No organomegaly or mass. Neuro:  Alert and oriented times 3. Speech clear, fluent and appropriate.  Cranial nerves intact.  No focal weakness. Psych:  Mood and affect normal.  Speech pattern normal.  Thought content normal with no suicidal or homicidal ideation.  No  paranoia, hallucinations, or delusions.  Memory, insight, and judgement normal.  Assessment   The encounter diagnosis was Psychosis, unspecified psychosis type.   Plan   1.  Meds:  The following meds were prescribed:   Discharge Medication List as of 10/01/2014  6:49 PM    START taking these medications   Details  !! benztropine (COGENTIN) 0.5 MG tablet 1 daily at bedtime, Normal    !! busPIRone (BUSPAR) 5 MG tablet Take 1 tablet (5 mg total) by mouth 3 (three) times daily., Starting 10/01/2014, Until Discontinued, Normal    FLUoxetine (PROZAC) 20 MG tablet 1 TID, Normal    !! hydrOXYzine (ATARAX/VISTARIL) 25 MG tablet 1 every 4 hours while awake, Normal    !! OLANZapine zydis (ZYPREXA) 5 MG disintegrating tablet 1 in morning and 3 at bedtime., Normal    !! zolpidem (AMBIEN) 5 MG tablet Take 1 tablet (5 mg total) by mouth at bedtime as needed for sleep., Starting 10/01/2014, Until Discontinued, Print     !! - Potential duplicate medications found. Please discuss with provider.      2.  Patient Education/Counseling:  The patient was given appropriate handouts, self care instructions, and instructed in symptomatic relief.  All medications were refilled. [facility]  3.  Follow up:  The patient was told to follow up if no better in 3 to 4 days, if becoming worse in any way, and given some red flag symptoms such as worsening symptoms or suicidal ideation which would prompt immediate return.       Reuben Likesavid C Daven Pinckney, MD 10/01/14 [phone]

## 2014-10-01 NOTE — Discharge Instructions (Signed)
Follow up with [facility] on Monday.

## 2014-10-01 NOTE — ED Notes (Signed)
Pt here refill on medication.  Has been with out anxiety meds x 2 days.   States "I'm having a panic attack'

## 2014-11-22 ENCOUNTER — Emergency Department (HOSPITAL_COMMUNITY)
Admission: EM | Admit: 2014-11-22 | Discharge: 2014-11-22 | Disposition: A | Discharge status: Home / Self Care | Payer: Medicaid Other | Attending: Emergency Medicine | Admitting: Emergency Medicine

## 2014-11-22 ENCOUNTER — Encounter (HOSPITAL_COMMUNITY): Payer: Self-pay | Admitting: Physical Medicine and Rehabilitation

## 2014-11-22 DIAGNOSIS — Z880 Allergy status to penicillin: Secondary | ICD-10-CM | POA: Insufficient documentation

## 2014-11-22 DIAGNOSIS — Z79899 Other long term (current) drug therapy: Secondary | ICD-10-CM | POA: Insufficient documentation

## 2014-11-22 DIAGNOSIS — F329 Major depressive disorder, single episode, unspecified: Secondary | ICD-10-CM | POA: Diagnosis not present

## 2014-11-22 DIAGNOSIS — Z3202 Encounter for pregnancy test, result negative: Secondary | ICD-10-CM | POA: Diagnosis not present

## 2014-11-22 DIAGNOSIS — R300 Dysuria: Secondary | ICD-10-CM | POA: Diagnosis present

## 2014-11-22 DIAGNOSIS — N390 Urinary tract infection, site not specified: Secondary | ICD-10-CM | POA: Diagnosis not present

## 2014-11-22 DIAGNOSIS — Z720 Tobacco use: Secondary | ICD-10-CM | POA: Diagnosis not present

## 2014-11-22 DIAGNOSIS — F410 Panic disorder [episodic paroxysmal anxiety] without agoraphobia: Secondary | ICD-10-CM | POA: Diagnosis not present

## 2014-11-22 DIAGNOSIS — F209 Schizophrenia, unspecified: Secondary | ICD-10-CM | POA: Insufficient documentation

## 2014-11-22 LAB — URINALYSIS, ROUTINE W REFLEX MICROSCOPIC
BILIRUBIN URINE: NEGATIVE
GLUCOSE, UA: NEGATIVE mg/dL
Ketones, ur: NEGATIVE mg/dL
Nitrite: NEGATIVE
Protein, ur: 30 mg/dL — AB
SPECIFIC GRAVITY, URINE: 1.027 (ref 1.005–1.030)
UROBILINOGEN UA: 0.2 mg/dL (ref 0.0–1.0)
pH: 6.5 (ref 5.0–8.0)

## 2014-11-22 LAB — POC URINE PREG, ED: PREG TEST UR: NEGATIVE

## 2014-11-22 LAB — URINE MICROSCOPIC-ADD ON

## 2014-11-22 MED ORDER — PHENAZOPYRIDINE HCL 200 MG PO TABS: 200.0000 mg | ORAL_TABLET | Freq: Three times a day (TID) | ORAL | Status: DC

## 2014-11-22 MED ORDER — CEPHALEXIN 500 MG PO CAPS: 500.0000 mg | ORAL_CAPSULE | Freq: Four times a day (QID) | ORAL | Status: DC

## 2014-11-22 NOTE — ED Provider Notes (Signed)
CSN: [id number]     Arrival date & time 11/22/14  [id number] History   First MD Initiated Contact with Patient 11/22/14 0730     Chief Complaint  Patient presents with  . Dysuria   (Consider location/radiation/quality/duration/timing/severity/associated sxs/prior Treatment) HPI  Jody [name] is a 44 yo female presenting with report of pain with urination.  She states this has been ongoing for about a month but it is intermittent.  It is not painful every time she urinates and seems to be worse after drinking alcohol.  She has a history of interstitial cystitis and was managed by a urologist but has been off any meds for that treatment for years because her symptoms were well managed. She does not have any pelvic or abd pain at anytime with the dysuria. Her LMP was 2 weeks ago. She denies any fevers, chills, nausea, vomiting, diarrhea, or vaginal symptoms.      Past Medical History  Diagnosis Date  . Cystitis, interstitial   . Anxiety and depression   . Panic attack   . Schizophrenia    Past Surgical History  Procedure Laterality Date  . Tubal ligation     Family History  Problem Relation Age of Onset  . Diabetes Father   . Hypertension Mother   . Hypertension Sister   . Lupus Sister   . Heart disease Maternal Grandfather   . Lupus     History  Substance Use Topics  . Smoking status: Current Every Day Smoker -- 0.25 packs/day for 4 years    Types: Cigarettes  . Smokeless tobacco: Not on file  . Alcohol Use: Yes   OB History    No data available     Review of Systems  Constitutional: Negative for fever and chills.  HENT: Negative for sore throat.   Eyes: Negative for visual disturbance.  Respiratory: Negative for cough and shortness of breath.   Cardiovascular: Negative for chest pain and leg swelling.  Gastrointestinal: Negative for nausea, vomiting, abdominal pain and diarrhea.  Genitourinary: Positive for dysuria. Negative for flank pain, vaginal bleeding, vaginal  discharge, vaginal pain and pelvic pain.  Musculoskeletal: Negative for myalgias.  Skin: Negative for rash.  Neurological: Negative for weakness, numbness and headaches.    Allergies  Penicillins  Home Medications   Prior to Admission medications   Medication Sig Start Date End Date Taking? Authorizing Provider  benztropine (COGENTIN) 0.5 MG tablet Take 1 tablet (0.5 mg total) by mouth at bedtime. 09/19/14   Fransisca KaufmannLaura Davis, NP  benztropine (COGENTIN) 0.5 MG tablet 1 daily at bedtime 10/01/14   Reuben Likesavid C Keller, MD  busPIRone (BUSPAR) 5 MG tablet Take 1 tablet (5 mg total) by mouth 3 (three) times daily. 09/19/14   Fransisca KaufmannLaura Davis, NP  busPIRone (BUSPAR) 5 MG tablet Take 1 tablet (5 mg total) by mouth 3 (three) times daily. 10/01/14   Reuben Likesavid C Keller, MD  FLUoxetine (PROZAC) 20 MG capsule Take 3 capsules (60 mg total) by mouth daily. 09/19/14   Fransisca KaufmannLaura Davis, NP  FLUoxetine (PROZAC) 20 MG tablet 1 TID 10/01/14   Reuben Likesavid C Keller, MD  hydrOXYzine (ATARAX/VISTARIL) 25 MG tablet Take 1 tablet (25 mg total) by mouth every 4 (four) hours as needed for anxiety (Sleep). 07/07/14   Sanjuana KavaAgnes I Nwoko, NP  hydrOXYzine (ATARAX/VISTARIL) 25 MG tablet 1 every 4 hours while awake 10/01/14   Reuben Likesavid C Keller, MD  Multiple Vitamin (MULTIVITAMIN WITH MINERALS) TABS tablet Take 1 tablet by mouth daily. 09/19/14   Vernona RiegerLaura  Earlene Plater, NP  OLANZapine zydis (ZYPREXA) 15 MG disintegrating tablet Take 1 tablet (15 mg total) by mouth at bedtime. 09/19/14   Fransisca Kaufmann, NP  OLANZapine zydis (ZYPREXA) 5 MG disintegrating tablet Take 1 tablet (5 mg total) by mouth daily. 09/19/14   Fransisca Kaufmann, NP  OLANZapine zydis (ZYPREXA) 5 MG disintegrating tablet 1 in morning and 3 at bedtime. 10/01/14   Reuben Likes, MD  zolpidem (AMBIEN) 5 MG tablet Take 1 tablet (5 mg total) by mouth at bedtime. For insomnia 07/07/14   Sanjuana Kava, NP  zolpidem (AMBIEN) 5 MG tablet Take 1 tablet (5 mg total) by mouth at bedtime as needed for sleep. 10/01/14   Reuben Likes, MD   BP 142/87 mmHg  Pulse 92  Temp(Src) 98.3 F (36.8 C) (Oral)  Resp 18  SpO2 100% Physical Exam  Constitutional: She appears well-developed and well-nourished. No distress.  HENT:  Head: Normocephalic and atraumatic.  Eyes: Conjunctivae are normal.  Neck: Neck supple.  Cardiovascular: Normal rate, regular rhythm and intact distal pulses.   Pulmonary/Chest: Effort normal and breath sounds normal. No respiratory distress.  Abdominal: Soft. She exhibits no distension and no mass. There is no hepatosplenomegaly. There is no tenderness. There is no rigidity, no rebound, no guarding, no CVA tenderness, no tenderness at McBurney's point and negative Murphy's sign.  Musculoskeletal: She exhibits no tenderness.  Lymphadenopathy:    She has no cervical adenopathy.  Neurological: She is alert.  Skin: Skin is warm and dry. No rash noted. She is not diaphoretic.  Psychiatric: She has a normal mood and affect.  Nursing note and vitals reviewed.   ED Course  Procedures (including critical care time) Labs Review Labs Reviewed - No data to display  Imaging Review No results found.   EKG Interpretation None      MDM   Final diagnoses:  UTI (lower urinary tract infection)   44 yo with history of interstitial cystitis and intermittent dysuria.  Her UA today does show large leukocytes and moderate hgb consistent with UTI. Urine culture sent. She has no vaginal symptoms, suprapubic, or flank pain and no CVA tenderness. She is afebrile,  normotensive, and denies N/V. Discussed techiniques to minimize likelihood of UTI and symptom management of interstitial cystitis.  Prescription for pyridium and abx provided. Pt is well-appearing, and in no acute distress She appears safe to be discharged.  Discharge include follow-up with their PCP.  Return precautions provided. Pt aware of plan and in agreement.   Filed Vitals:   11/22/14 0729  BP: 142/87  Pulse: 92  Temp: 98.3 F (36.8 C)   TempSrc: Oral  Resp: 18  SpO2: 100%   Meds given in ED:  Medications - No data to display  New Prescriptions   CEPHALEXIN (KEFLEX) 500 MG CAPSULE    Take 1 capsule (500 mg total) by mouth 4 (four) times daily.   PHENAZOPYRIDINE (PYRIDIUM) 200 MG TABLET    Take 1 tablet (200 mg total) by mouth 3 (three) times daily.        Harle Battiest, NP 11/22/14 1610  Suzi Roots, MD 11/22/14 1020

## 2014-11-22 NOTE — Discharge Instructions (Signed)
The directions provided. Be sure to follow-up with your primary care doctor or urologist for further management of your symptoms. Please take the antibiotic as directed until it is all gone for treatment of the infection in He may use the Pyridium as needed for discomfort while urinating. Please continue to drink plenty of water to stay well hydrated. Don't hesitate to return for any new, worsening, or concerning symptoms.   SEEK IMMEDIATE MEDICAL CARE IF:  You have severe back pain or lower abdominal pain.  You develop chills.  You have nausea or vomiting.  You have continued burning or discomfort with urination.

## 2014-11-22 NOTE — ED Notes (Signed)
Pt presents to department for evaluation of dysuria. Ongoing x1 month. Pt states "I think I have a bladder infection." denies pain at the time. Pt is alert and oriented x4.

## 2014-11-23 LAB — URINE CULTURE
Colony Count: >=100000
Special Requests: NORMAL

## 2015-01-06 ENCOUNTER — Encounter: Payer: Self-pay | Admitting: Internal Medicine

## 2015-01-06 ENCOUNTER — Ambulatory Visit: Payer: Medicaid Other | Attending: Internal Medicine | Admitting: Internal Medicine

## 2015-01-06 VITALS — BP 132/85 | HR 91 | Temp 97.9°F | Resp 15 | Wt 184.4 lb

## 2015-01-06 DIAGNOSIS — R21 Rash and other nonspecific skin eruption: Secondary | ICD-10-CM | POA: Diagnosis present

## 2015-01-06 DIAGNOSIS — R413 Other amnesia: Secondary | ICD-10-CM | POA: Diagnosis not present

## 2015-01-06 DIAGNOSIS — R51 Headache: Secondary | ICD-10-CM | POA: Insufficient documentation

## 2015-01-06 DIAGNOSIS — G8929 Other chronic pain: Secondary | ICD-10-CM | POA: Insufficient documentation

## 2015-01-06 DIAGNOSIS — H6121 Impacted cerumen, right ear: Secondary | ICD-10-CM

## 2015-01-06 LAB — COMPLETE METABOLIC PANEL WITH GFR
ALBUMIN: 4.6 g/dL (ref 3.5–5.2)
ALT: 12 U/L (ref 0–35)
AST: 17 U/L (ref 0–37)
Alkaline Phosphatase: 69 U/L (ref 39–117)
BUN: 8 mg/dL (ref 6–23)
CO2: 25 mEq/L (ref 19–32)
Calcium: 9.4 mg/dL (ref 8.4–10.5)
Chloride: 106 mEq/L (ref 96–112)
Creat: 0.78 mg/dL (ref 0.50–1.10)
GFR, Est African American: >89 mL/min
GFR, Est Non African American: >89 mL/min
GLUCOSE: 87 mg/dL (ref 70–99)
POTASSIUM: 4.9 meq/L (ref 3.5–5.3)
SODIUM: 140 meq/L (ref 135–145)
TOTAL PROTEIN: 7.4 g/dL (ref 6.0–8.3)
Total Bilirubin: 0.3 mg/dL (ref 0.2–1.2)

## 2015-01-06 LAB — TSH: TSH: 1.95 u[IU]/mL (ref 0.350–4.500)

## 2015-01-06 LAB — VITAMIN B12: VITAMIN B 12: 432 pg/mL (ref 211–911)

## 2015-01-06 MED ORDER — CARBAMIDE PEROXIDE 6.5 % OT SOLN: 5.0000 [drp] | Freq: Two times a day (BID) | OTIC | Status: DC

## 2015-01-06 MED ORDER — HYDROCORTISONE 2.5 % EX CREA: TOPICAL_CREAM | Freq: Two times a day (BID) | CUTANEOUS | Status: DC

## 2015-01-06 NOTE — Progress Notes (Signed)
Patient complains of having a rash in both ears for almost five Months that has gotten worse- dry and itchy Patient is also requesting a referral to neuro because she is having Trouble remembering things

## 2015-01-06 NOTE — Progress Notes (Signed)
MRN: [id number] Name: Jody [name]  Sex: female Age: 44 y.o. DOB: 1971/04/26  Allergies: Penicillins  Chief Complaint  Patient presents with  . Rash    HPI: Patient is 44 y.o. female who history of depression/anxiety/schizophrenia, accompanied with a family member comes today complaining of rash in her both ears for the last few months, as per patient she has lot of itching in her ears and tried to clean it up, denies any fever chills she does complain of on and off headaches and is concerned about memory issues, she has become more forgetful especially with recent memory, she's requesting referral to see a neurologist for evaluation.patient is following up with her psychiatrist.  Past Medical History  Diagnosis Date  . Cystitis, interstitial   . Anxiety and depression   . Panic attack   . Schizophrenia     Past Surgical History  Procedure Laterality Date  . Tubal ligation        Medication List       This list is accurate as of: 01/06/15  4:33 PM.  Always use your most recent med list.               benztropine 0.5 MG tablet  Commonly known as:  COGENTIN  Take 1 tablet (0.5 mg total) by mouth at bedtime.     benztropine 0.5 MG tablet  Commonly known as:  COGENTIN  1 daily at bedtime     busPIRone 5 MG tablet  Commonly known as:  BUSPAR  Take 1 tablet (5 mg total) by mouth 3 (three) times daily.     busPIRone 5 MG tablet  Commonly known as:  BUSPAR  Take 1 tablet (5 mg total) by mouth 3 (three) times daily.     carbamide peroxide 6.5 % otic solution  Commonly known as:  DEBROX  Place 5 drops into both ears 2 (two) times daily.     cephALEXin 500 MG capsule  Commonly known as:  KEFLEX  Take 1 capsule (500 mg total) by mouth 4 (four) times daily.     FLUoxetine 20 MG capsule  Commonly known as:  PROZAC  Take 3 capsules (60 mg total) by mouth daily.     FLUoxetine 20 MG tablet  Commonly known as:  PROZAC  1 TID     hydrocortisone 2.5 % cream    Apply topically 2 (two) times daily.     hydrOXYzine 25 MG tablet  Commonly known as:  ATARAX/VISTARIL  Take 1 tablet (25 mg total) by mouth every 4 (four) hours as needed for anxiety (Sleep).     hydrOXYzine 25 MG tablet  Commonly known as:  ATARAX/VISTARIL  1 every 4 hours while awake     multivitamin with minerals Tabs tablet  Take 1 tablet by mouth daily.     olanzapine zydis 15 MG disintegrating tablet  Commonly known as:  ZYPREXA  Take 1 tablet (15 mg total) by mouth at bedtime.     OLANZapine zydis 5 MG disintegrating tablet  Commonly known as:  ZYPREXA  Take 1 tablet (5 mg total) by mouth daily.     OLANZapine zydis 5 MG disintegrating tablet  Commonly known as:  ZYPREXA  1 in morning and 3 at bedtime.     phenazopyridine 200 MG tablet  Commonly known as:  PYRIDIUM  Take 1 tablet (200 mg total) by mouth 3 (three) times daily.     zolpidem 5 MG tablet  Commonly known as:  AMBIEN  Take 1 tablet (5 mg total) by mouth at bedtime. For insomnia     zolpidem 5 MG tablet  Commonly known as:  AMBIEN  Take 1 tablet (5 mg total) by mouth at bedtime as needed for sleep.        Meds ordered this encounter  Medications  . hydrocortisone 2.5 % cream    Sig: Apply topically 2 (two) times daily.    Dispense:  30 g    Refill:  0  . carbamide peroxide (DEBROX) 6.5 % otic solution    Sig: Place 5 drops into both ears 2 (two) times daily.    Dispense:  15 mL    Refill:  1    Immunization History  Administered Date(s) Administered  . Influenza,inj,Quad PF,36+ Mos 07/05/2014, 09/22/2014  . Pneumococcal Polysaccharide-23 07/05/2014    Family History  Problem Relation Age of Onset  . Diabetes Father   . Hypertension Mother   . Hypertension Sister   . Lupus Sister   . Heart disease Maternal Grandfather   . Lupus      History  Substance Use Topics  . Smoking status: Current Every Day Smoker -- 0.25 packs/day for 4 years    Types: Cigarettes  . Smokeless tobacco:  Not on file  . Alcohol Use: Yes    Review of Systems   As noted in HPI  Filed Vitals:   01/06/15 1447  BP: 132/85  Pulse: 91  Temp: 97.9 F (36.6 C)  Resp: 15    Physical Exam  Physical Exam  Constitutional: She is oriented to person, place, and time.  HENT:  Dry rash on both ears, increased wax noted in right ear  Eyes: EOM are normal. Pupils are equal, round, and reactive to light.  Cardiovascular: Normal rate and regular rhythm.   Pulmonary/Chest: Breath sounds normal. No respiratory distress. She has no wheezes. She has no rales.  Musculoskeletal: She exhibits no edema.  Neurological: She is alert and oriented to person, place, and time. She has normal reflexes. No cranial nerve deficit. Coordination normal.    CBC    Component Value Date/Time   WBC 3.4* 09/12/2014 1001   RBC 5.60* 09/12/2014 1001   HGB 13.2 09/12/2014 1001   HCT 42.2 09/12/2014 1001   PLT 288 09/12/2014 1001   MCV 75.4* 09/12/2014 1001   LYMPHSABS 1.1 09/12/2014 1001   MONOABS 0.3 09/12/2014 1001   EOSABS 0.0 09/12/2014 1001   BASOSABS 0.0 09/12/2014 1001    CMP     Component Value Date/Time   NA 140 09/12/2014 1001   K 4.0 09/12/2014 1001   CL 99 09/12/2014 1001   CO2 26 09/12/2014 1001   GLUCOSE 90 09/12/2014 1001   BUN 4* 09/12/2014 1001   CREATININE 0.76 09/12/2014 1001   CALCIUM 9.7 09/12/2014 1001   PROT 7.9 09/12/2014 1001   ALBUMIN 4.6 09/12/2014 1001   AST 16 09/12/2014 1001   ALT 11 09/12/2014 1001   ALKPHOS 65 09/12/2014 1001   BILITOT 0.5 09/12/2014 1001   GFRNONAA >90 09/12/2014 1001   GFRAA >90 09/12/2014 1001    Lab Results  Component Value Date/Time   CHOL 169 09/18/2014 06:34 AM    No components found for: HGA1C  Lab Results  Component Value Date/Time   AST 16 09/12/2014 10:01 AM    Assessment and Plan  Rash and nonspecific skin eruption - Plan: trial of hydrocortisone 2.5 % cream  Memory change - Plan: TSH, Vitamin B12, Ambulatory  referral to  Neurology, COMPLETE METABOLIC PANEL WITH GFR, CT Head Wo Contrast  Excess ear wax, right - Plan: carbamide peroxide (DEBROX) 6.5 % otic solution  Chronic nonintractable headache, unspecified headache type - Plan: Ambulatory referral to Neurology  Return in about 3 months (around 04/07/2015), or if symptoms worsen or fail to improve.   This note has been created with Education officer, environmental. Any transcriptional errors are unintentional.    Doris Cheadle, MD

## 2015-01-08 ENCOUNTER — Telehealth: Payer: Self-pay

## 2015-01-08 MED ORDER — VITAMIN D (ERGOCALCIFEROL) 1.25 MG (50000 UNIT) PO CAPS: 50000.0000 [IU] | ORAL_CAPSULE | ORAL | Status: DC

## 2015-01-08 NOTE — Telephone Encounter (Signed)
-----   Message from [name], MD sent at 01/07/2015  9:24 AM EDT ----- Blood work reviewed, noticed low vitamin D, call patient advise to start ergocalciferol 50,000 units once a week for the duration of  12 weeks.   

## 2015-01-08 NOTE — Telephone Encounter (Signed)
Spoke with patient son and left message for her to return our call

## 2015-01-12 ENCOUNTER — Telehealth: Payer: Self-pay | Admitting: *Deleted

## 2015-01-12 NOTE — Telephone Encounter (Signed)
Left HIPPA compliant message.  If patient calls in please tell her that her insurance company has denied her CT of the head scheduled for Wednesday.  I am letting Dr. Orpah CobbAdvani know and we will let her know how he would like to progress.  I have also cancelled her CT for Wednesday

## 2015-01-13 ENCOUNTER — Ambulatory Visit (HOSPITAL_COMMUNITY): Payer: Medicaid Other

## 2015-01-29 ENCOUNTER — Emergency Department (HOSPITAL_COMMUNITY)
Admission: EM | Admit: 2015-01-29 | Discharge: 2015-01-29 | Disposition: A | Discharge status: Other Acute Inpatient Hospital | Payer: Medicaid Other | Attending: Emergency Medicine | Admitting: Emergency Medicine

## 2015-01-29 ENCOUNTER — Encounter (HOSPITAL_COMMUNITY): Payer: Self-pay | Admitting: Emergency Medicine

## 2015-01-29 ENCOUNTER — Encounter (HOSPITAL_COMMUNITY): Payer: Self-pay | Admitting: *Deleted

## 2015-01-29 ENCOUNTER — Inpatient Hospital Stay (HOSPITAL_COMMUNITY)
Admission: AD | Admit: 2015-01-29 | Discharge: 2015-02-02 | DRG: 885 | Disposition: A | Discharge status: Home / Self Care | Payer: Medicaid Other | Source: Intra-hospital | Attending: Psychiatry | Admitting: Psychiatry

## 2015-01-29 DIAGNOSIS — F333 Major depressive disorder, recurrent, severe with psychotic symptoms: Secondary | ICD-10-CM | POA: Insufficient documentation

## 2015-01-29 DIAGNOSIS — Z87448 Personal history of other diseases of urinary system: Secondary | ICD-10-CM | POA: Diagnosis not present

## 2015-01-29 DIAGNOSIS — Z79899 Other long term (current) drug therapy: Secondary | ICD-10-CM | POA: Diagnosis not present

## 2015-01-29 DIAGNOSIS — R440 Auditory hallucinations: Secondary | ICD-10-CM

## 2015-01-29 DIAGNOSIS — Z7952 Long term (current) use of systemic steroids: Secondary | ICD-10-CM | POA: Insufficient documentation

## 2015-01-29 DIAGNOSIS — R45851 Suicidal ideations: Secondary | ICD-10-CM | POA: Diagnosis present

## 2015-01-29 DIAGNOSIS — F200 Paranoid schizophrenia: Secondary | ICD-10-CM | POA: Diagnosis present

## 2015-01-29 DIAGNOSIS — F17210 Nicotine dependence, cigarettes, uncomplicated: Secondary | ICD-10-CM | POA: Diagnosis present

## 2015-01-29 DIAGNOSIS — Z9119 Patient's noncompliance with other medical treatment and regimen: Secondary | ICD-10-CM | POA: Diagnosis not present

## 2015-01-29 DIAGNOSIS — Z8249 Family history of ischemic heart disease and other diseases of the circulatory system: Secondary | ICD-10-CM

## 2015-01-29 DIAGNOSIS — Z046 Encounter for general psychiatric examination, requested by authority: Secondary | ICD-10-CM | POA: Diagnosis present

## 2015-01-29 DIAGNOSIS — Z880 Allergy status to penicillin: Secondary | ICD-10-CM | POA: Insufficient documentation

## 2015-01-29 DIAGNOSIS — Z792 Long term (current) use of antibiotics: Secondary | ICD-10-CM | POA: Insufficient documentation

## 2015-01-29 DIAGNOSIS — Z720 Tobacco use: Secondary | ICD-10-CM | POA: Insufficient documentation

## 2015-01-29 DIAGNOSIS — Z833 Family history of diabetes mellitus: Secondary | ICD-10-CM

## 2015-01-29 DIAGNOSIS — F410 Panic disorder [episodic paroxysmal anxiety] without agoraphobia: Secondary | ICD-10-CM | POA: Diagnosis present

## 2015-01-29 LAB — RAPID URINE DRUG SCREEN, HOSP PERFORMED
Amphetamines: NOT DETECTED
Barbiturates: NOT DETECTED
Benzodiazepines: NOT DETECTED
COCAINE: NOT DETECTED
OPIATES: NOT DETECTED
Tetrahydrocannabinol: NOT DETECTED

## 2015-01-29 LAB — COMPREHENSIVE METABOLIC PANEL
ALBUMIN: 4.2 g/dL (ref 3.5–5.2)
ALK PHOS: 70 U/L (ref 39–117)
ALT: 20 U/L (ref 0–35)
AST: 25 U/L (ref 0–37)
Anion gap: 9 (ref 5–15)
BILIRUBIN TOTAL: 0.7 mg/dL (ref 0.3–1.2)
BUN: 8 mg/dL (ref 6–23)
CO2: 24 mmol/L (ref 19–32)
Calcium: 9.3 mg/dL (ref 8.4–10.5)
Chloride: 108 mmol/L (ref 96–112)
Creatinine, Ser: 0.94 mg/dL (ref 0.50–1.10)
GFR calc Af Amer: 86 mL/min — ABNORMAL LOW (ref >90)
GFR calc non Af Amer: 74 mL/min — ABNORMAL LOW (ref >90)
GLUCOSE: 107 mg/dL — AB (ref 70–99)
POTASSIUM: 3.7 mmol/L (ref 3.5–5.1)
Sodium: 141 mmol/L (ref 135–145)
TOTAL PROTEIN: 7.3 g/dL (ref 6.0–8.3)

## 2015-01-29 LAB — CBC WITH DIFFERENTIAL/PLATELET
Basophils Absolute: 0 10*3/uL (ref 0.0–0.1)
Basophils Relative: 0 % (ref 0–1)
Eosinophils Absolute: 0.1 10*3/uL (ref 0.0–0.7)
Eosinophils Relative: 2 % (ref 0–5)
HCT: 40.6 % (ref 36.0–46.0)
Hemoglobin: 12.7 g/dL (ref 12.0–15.0)
LYMPHS PCT: 44 % (ref 12–46)
Lymphs Abs: 1.8 10*3/uL (ref 0.7–4.0)
MCH: 23.4 pg — AB (ref 26.0–34.0)
MCHC: 31.3 g/dL (ref 30.0–36.0)
MCV: 74.8 fL — ABNORMAL LOW (ref 78.0–100.0)
MONOS PCT: 6 % (ref 3–12)
Monocytes Absolute: 0.3 10*3/uL (ref 0.1–1.0)
NEUTROS PCT: 48 % (ref 43–77)
Neutro Abs: 2 10*3/uL (ref 1.7–7.7)
PLATELETS: 280 10*3/uL (ref 150–400)
RBC: 5.43 MIL/uL — ABNORMAL HIGH (ref 3.87–5.11)
RDW: 15 % (ref 11.5–15.5)
WBC: 4.2 10*3/uL (ref 4.0–10.5)

## 2015-01-29 LAB — ACETAMINOPHEN LEVEL: Acetaminophen (Tylenol), Serum: <10 ug/mL — ABNORMAL LOW (ref 10–30)

## 2015-01-29 LAB — SALICYLATE LEVEL: Salicylate Lvl: <4 mg/dL (ref 2.8–20.0)

## 2015-01-29 LAB — ETHANOL

## 2015-01-29 MED ORDER — LORAZEPAM 1 MG PO TABS: 0.0000 mg | ORAL_TABLET | Freq: Two times a day (BID) | ORAL | Status: DC

## 2015-01-29 MED ORDER — NICOTINE 21 MG/24HR TD PT24: 21.0000 mg | MEDICATED_PATCH | Freq: Every day | TRANSDERMAL | Status: DC

## 2015-01-29 MED ORDER — VITAMIN B-1 100 MG PO TABS: 100.0000 mg | ORAL_TABLET | Freq: Every day | ORAL | Status: DC

## 2015-01-29 MED ORDER — THIAMINE HCL 100 MG/ML IJ SOLN: 100.0000 mg | Freq: Every day | INTRAMUSCULAR | Status: DC

## 2015-01-29 MED ORDER — ALUM & MAG HYDROXIDE-SIMETH 200-200-20 MG/5ML PO SUSP: 30.0000 mL | ORAL | Status: DC | PRN

## 2015-01-29 MED ORDER — ONDANSETRON HCL 4 MG PO TABS: 4.0000 mg | ORAL_TABLET | Freq: Three times a day (TID) | ORAL | Status: DC | PRN

## 2015-01-29 MED ORDER — LORAZEPAM 1 MG PO TABS: 1.0000 mg | ORAL_TABLET | Freq: Three times a day (TID) | ORAL | Status: DC | PRN

## 2015-01-29 MED ORDER — ZOLPIDEM TARTRATE 5 MG PO TABS: 5.0000 mg | ORAL_TABLET | Freq: Every evening | ORAL | Status: DC | PRN

## 2015-01-29 MED ORDER — IBUPROFEN 200 MG PO TABS: 600.0000 mg | ORAL_TABLET | Freq: Three times a day (TID) | ORAL | Status: DC | PRN

## 2015-01-29 MED ORDER — LORAZEPAM 1 MG PO TABS: 0.0000 mg | ORAL_TABLET | Freq: Four times a day (QID) | ORAL | Status: DC

## 2015-01-29 NOTE — ED Notes (Signed)
[employer] at bedside 

## 2015-01-29 NOTE — ED Notes (Signed)
Pt made aware that she will be moved to the holding unit, therefore, she needs to change into the scrubs.  She's also notified that her belongings cannot stay in the room with her.  Pt agreed.

## 2015-01-29 NOTE — ED Notes (Signed)
Pt takes olazepine for auditory hallucinations, states that she stopped taking the medication four days ago due to weight gain. Pt denies any current SI/HI/AVH. Patient states she wants to take a medication but needs one that will not cause weight gain.

## 2015-01-29 NOTE — ED Notes (Signed)
Patient pleasant, cooperative. Denies SI, HI, AVH. Rates anxiety 7/10 and reports feelings of depression upon waking on 4/29. Reports feeling "fine" at present. States she has trouble staying asleep getting around 4 hours per night.   Encouragement offered. Shower supplies provided. Snack provided.  Q 15 safety checks in place.

## 2015-01-29 NOTE — Tx Team (Signed)
Initial Interdisciplinary Treatment Plan   PATIENT STRESSORS: Medication change or noncompliance Substance abuse   PATIENT STRENGTHS: Ability for insight Active sense of humor Average or above average intelligence Capable of independent living Communication skills General fund of knowledge Motivation for treatment/growth Physical Health Special hobby/interest Supportive family/friends Work skills   PROBLEM LIST: Problem List/Patient Goals Date to be addressed Date deferred Reason deferred Estimated date of resolution  "Get me on the right track help me to take my med so I can live productively" 01/29/15     "I want to see if the olanzpinecan be changed" 01/29/15           Depression 01/29/15     Increased risk for suicide 01/29/15                              DISCHARGE CRITERIA:  Ability to meet basic life and health needs Adequate post-discharge living arrangements Improved stabilization in mood, thinking, and/or behavior Medical problems require only outpatient monitoring Motivation to continue treatment in a less acute level of care Need for constant or close observation no longer present Safe-care adequate arrangements made Verbal commitment to aftercare and medication compliance  PRELIMINARY DISCHARGE PLAN: Attend aftercare/continuing care group Outpatient therapy Participate in family therapy Return to previous living arrangement  PATIENT/FAMIILY INVOLVEMENT: This treatment plan has been presented to and reviewed with the patient, Jody [name], and/or family member.  The patient and family have been given the opportunity to ask questions and make suggestions.  Fransico MichaelBrooks, Yanin Muhlestein Laverne 01/29/2015, 11:15 PM

## 2015-01-29 NOTE — BH Assessment (Signed)
Assessment completed. Consulted Hulan FessIjeoma Nwaeze, NP who agrees that pt meets inpatient criteria. Dahlia ClientHannah Muthersbaugh,PA-C has been informed of the recommendation.

## 2015-01-29 NOTE — BH Assessment (Signed)
[facility] Bed 1 to the services of Dr. Elna BreslowEappen. Dahlia ClientHannah Muthersbaugh, PA-C has been informed of [facility].

## 2015-01-29 NOTE — Progress Notes (Signed)
Patient upset that she is under IVC. States that she feels she was tricked. States that she only wants her medications changed.

## 2015-01-29 NOTE — ED Notes (Signed)
Bed: [id number] Expected date: 01/29/15 Expected time:  Means of arrival:  Comments: Hold for door repair per facilities

## 2015-01-29 NOTE — ED Provider Notes (Signed)
CSN: [id number]     Arrival date & time 01/29/15  1515 History   First MD Initiated Contact with Patient 01/29/15 1723     Chief Complaint  Patient presents with  . Medication Adjustment      (Consider location/radiation/quality/duration/timing/severity/associated sxs/prior Treatment) The history is provided by the patient and medical records. No language interpreter was used.     Jody [name] is a 44 y.o. female  with a hx of schizophrenia, anxiety, depression, interstitial cystitis presents to the Emergency Department complaining of gradual, persistent, progressively worsening auditory hallucinations onset 2-3 days ago.  Patient reports she was hospitalized in December 2015 for a similar episode of hearing voices and feeling suicidal. She reports that she was placed on Zyprexa at that time. She reports that since then she's had a 25 pound weight gain which she attributes to the Zyprexa. Because of this she reports that she takes it only intermittently approximately 3 days per week. She reports that she has been 4 days without any medication.  She reports that during this time she began to hear voices in her head. She reports the voices told her to slit her wrist therefore this morning she called her mother and was evaluated at [facility]. IVC paperwork was completed at [facility] and patient was transported via [employer] here to the emergency department.  She denies all somatic symptoms. She reports alcohol usage this morning including 2, 12 ounce beers. She denies drug usage and is a current everyday smoker.  Past Medical History  Diagnosis Date  . Cystitis, interstitial   . Anxiety and depression   . Panic attack   . Schizophrenia    Past Surgical History  Procedure Laterality Date  . Tubal ligation     Family History  Problem Relation Age of Onset  . Diabetes Father   . Hypertension Mother   . Hypertension Sister   . Lupus Sister   . Heart disease Maternal Grandfather   . Lupus      History  Substance Use Topics  . Smoking status: Current Every Day Smoker -- 0.25 packs/day for 4 years    Types: Cigarettes  . Smokeless tobacco: Not on file  . Alcohol Use: Yes   OB History    No data available     Review of Systems  Constitutional: Negative for fever, diaphoresis, appetite change, fatigue and unexpected weight change.  HENT: Negative for mouth sores.   Eyes: Negative for visual disturbance.  Respiratory: Negative for cough, chest tightness, shortness of breath and wheezing.   Cardiovascular: Negative for chest pain.  Gastrointestinal: Negative for nausea, vomiting, abdominal pain, diarrhea and constipation.  Endocrine: Negative for polydipsia, polyphagia and polyuria.  Genitourinary: Negative for dysuria, urgency, frequency and hematuria.  Musculoskeletal: Negative for back pain and neck stiffness.  Skin: Negative for rash.  Allergic/Immunologic: Negative for immunocompromised state.  Neurological: Negative for syncope, light-headedness and headaches.  Hematological: Does not bruise/bleed easily.  Psychiatric/Behavioral: Positive for suicidal ideas and hallucinations. Negative for sleep disturbance. The patient is not nervous/anxious.       Allergies  Penicillins  Home Medications   Prior to Admission medications   Medication Sig Start Date End Date Taking? Authorizing Provider  benztropine (COGENTIN) 0.5 MG tablet Take 1 tablet (0.5 mg total) by mouth at bedtime. 09/19/14  Yes Thermon Leyland, NP  busPIRone (BUSPAR) 5 MG tablet Take 1 tablet (5 mg total) by mouth 3 (three) times daily. 09/19/14  Yes Thermon Leyland, NP  FLUoxetine (  PROZAC) 20 MG capsule Take 3 capsules (60 mg total) by mouth daily. 09/19/14  Yes Thermon LeylandLaura A Davis, NP  hydrocortisone 2.5 % cream Apply topically 2 (two) times daily. 01/06/15  Yes Doris Cheadleeepak Advani, MD  hydrOXYzine (ATARAX/VISTARIL) 25 MG tablet Take 1 tablet (25 mg total) by mouth every 4 (four) hours as needed for anxiety  (Sleep). Patient taking differently: Take 25 mg by mouth 3 (three) times daily as needed for anxiety (Sleep).  07/07/14  Yes Sanjuana KavaAgnes I Nwoko, NP  OLANZapine zydis (ZYPREXA) 15 MG disintegrating tablet Take 1 tablet (15 mg total) by mouth at bedtime. 09/19/14  Yes Thermon LeylandLaura A Davis, NP  OLANZapine zydis (ZYPREXA) 5 MG disintegrating tablet Take 1 tablet (5 mg total) by mouth daily. 09/19/14  Yes Thermon LeylandLaura A Davis, NP  carbamide peroxide (DEBROX) 6.5 % otic solution Place 5 drops into both ears 2 (two) times daily. Patient not taking: Reported on 01/29/2015 01/06/15   Doris Cheadleeepak Advani, MD  cephALEXin (KEFLEX) 500 MG capsule Take 1 capsule (500 mg total) by mouth 4 (four) times daily. Patient not taking: Reported on 01/29/2015 11/22/14   Harle BattiestElizabeth Tysinger, NP  Multiple Vitamin (MULTIVITAMIN WITH MINERALS) TABS tablet Take 1 tablet by mouth daily. Patient not taking: Reported on 01/29/2015 09/19/14   Thermon LeylandLaura A Davis, NP  phenazopyridine (PYRIDIUM) 200 MG tablet Take 1 tablet (200 mg total) by mouth 3 (three) times daily. Patient not taking: Reported on 01/29/2015 11/22/14   Harle BattiestElizabeth Tysinger, NP  Vitamin D, Ergocalciferol, (DRISDOL) 50000 UNITS CAPS capsule Take 1 capsule (50,000 Units total) by mouth every 7 (seven) days. Patient not taking: Reported on 01/29/2015 01/08/15   Doris Cheadleeepak Advani, MD  zolpidem (AMBIEN) 5 MG tablet Take 1 tablet (5 mg total) by mouth at bedtime. For insomnia Patient not taking: Reported on 01/29/2015 07/07/14   Sanjuana KavaAgnes I Nwoko, NP  zolpidem (AMBIEN) 5 MG tablet Take 1 tablet (5 mg total) by mouth at bedtime as needed for sleep. Patient not taking: Reported on 01/29/2015 10/01/14   Reuben Likesavid C Keller, MD   BP 127/96 mmHg  Pulse 70  Temp(Src) 98.2 F (36.8 C) (Oral)  Resp 16  SpO2 100% Physical Exam  Constitutional: She appears well-developed and well-nourished. No distress.  Awake, alert, nontoxic appearance  HENT:  Head: Normocephalic and atraumatic.  Mouth/Throat: Oropharynx is clear and moist. No  oropharyngeal exudate.  Eyes: Conjunctivae are normal. No scleral icterus.  Neck: Normal range of motion. Neck supple.  Cardiovascular: Normal rate, regular rhythm, normal heart sounds and intact distal pulses.   Pulmonary/Chest: Effort normal and breath sounds normal. No respiratory distress. She has no wheezes.  Equal chest expansion  Abdominal: Soft. Bowel sounds are normal. She exhibits no mass. There is no tenderness. There is no rebound and no guarding.  Musculoskeletal: Normal range of motion. She exhibits no edema.  Neurological: She is alert.  Speech is clear and goal oriented Moves extremities without ataxia  Skin: Skin is warm and dry. She is not diaphoretic.  Psychiatric: Her speech is normal. She is actively hallucinating. She expresses suicidal ideation. She expresses no homicidal ideation. She expresses suicidal plans. She expresses no homicidal plans.  Patient reports she hears voices in her head which tell her to slit her wrist  Nursing note and vitals reviewed.   ED Course  Procedures (including critical care time) Labs Review Labs Reviewed  CBC WITH DIFFERENTIAL/PLATELET - Abnormal; Notable for the following:    RBC 5.43 (*)    MCV 74.8 (*)  MCH 23.4 (*)    All other components within normal limits  COMPREHENSIVE METABOLIC PANEL - Abnormal; Notable for the following:    Glucose, Bld 107 (*)    GFR calc non Af Amer 74 (*)    GFR calc Af Amer 86 (*)    All other components within normal limits  ACETAMINOPHEN LEVEL - Abnormal; Notable for the following:    Acetaminophen (Tylenol), Serum <10.0 (*)    All other components within normal limits  ETHANOL  SALICYLATE LEVEL  URINE RAPID DRUG SCREEN (HOSP PERFORMED)    Imaging Review No results found.   EKG Interpretation None      MDM   Final diagnoses:  Schizophrenia, paranoid  Severe recurrent major depressive disorder with psychotic features  Auditory hallucinations    Jody [name] presents  under IVC as a danger to herself due to medication noncompliance and subsequent command auditory hallucinations telling her to kill herself.    8:50 PM Labs reassuring and patient is medically cleared.  UDS pending. Patient discussed with  Lahoma Rocker who reports that she meets inpatient criteria and they will attempt to find a bed.    9:03 PM Pt has been accepted to [facility] room 505 Bed 2 by Dr. Elna Breslow.    BP 127/96 mmHg  Pulse 70  Temp(Src) 98.2 F (36.8 C) (Oral)  Resp 16  SpO2 100%   Dierdre Forth, PA-C 01/29/15 2103  Doug Sou, MD 01/30/15 [phone]

## 2015-01-29 NOTE — ED Provider Notes (Signed)
9:15 PM patient alert pleasant cooperative no distress. Presently offers no complaint. States" I feel much better since here." Results for orders placed or performed during the hospital encounter of 01/29/15  CBC WITH DIFFERENTIAL  Result Value Ref Range   WBC 4.2 4.0 - 10.5 K/uL   RBC 5.43 (H) 3.87 - 5.11 MIL/uL   Hemoglobin 12.7 12.0 - 15.0 g/dL   HCT 16.140.6 09.636.0 - 04.546.0 %   MCV 74.8 (L) 78.0 - 100.0 fL   MCH 23.4 (L) 26.0 - 34.0 pg   MCHC 31.3 30.0 - 36.0 g/dL   RDW 40.915.0 81.111.5 - 91.415.5 %   Platelets 280 150 - 400 K/uL   Neutrophils Relative % 48 43 - 77 %   Lymphocytes Relative 44 12 - 46 %   Monocytes Relative 6 3 - 12 %   Eosinophils Relative 2 0 - 5 %   Basophils Relative 0 0 - 1 %   Neutro Abs 2.0 1.7 - 7.7 K/uL   Lymphs Abs 1.8 0.7 - 4.0 K/uL   Monocytes Absolute 0.3 0.1 - 1.0 K/uL   Eosinophils Absolute 0.1 0.0 - 0.7 K/uL   Basophils Absolute 0.0 0.0 - 0.1 K/uL   Smear Review MORPHOLOGY UNREMARKABLE   Comprehensive metabolic panel  Result Value Ref Range   Sodium 141 135 - 145 mmol/L   Potassium 3.7 3.5 - 5.1 mmol/L   Chloride 108 96 - 112 mmol/L   CO2 24 19 - 32 mmol/L   Glucose, Bld 107 (H) 70 - 99 mg/dL   BUN 8 6 - 23 mg/dL   Creatinine, Ser 7.820.94 0.50 - 1.10 mg/dL   Calcium 9.3 8.4 - 95.610.5 mg/dL   Total Protein 7.3 6.0 - 8.3 g/dL   Albumin 4.2 3.5 - 5.2 g/dL   AST 25 0 - 37 U/L   ALT 20 0 - 35 U/L   Alkaline Phosphatase 70 39 - 117 U/L   Total Bilirubin 0.7 0.3 - 1.2 mg/dL   GFR calc non Af Amer 74 (L) >90 mL/min   GFR calc Af Amer 86 (L) >90 mL/min   Anion gap 9 5 - 15  Ethanol  Result Value Ref Range   Alcohol, Ethyl (B) <5 0 - 9 mg/dL  Acetaminophen level  Result Value Ref Range   Acetaminophen (Tylenol), Serum <10.0 (L) 10 - 30 ug/mL  Salicylate level  Result Value Ref Range   Salicylate Lvl <4.0 2.8 - 20.0 mg/dL   No results found.   Doug SouSam Munirah Doerner, MD 01/29/15 2121

## 2015-01-29 NOTE — BH Assessment (Addendum)
Tele Assessment Note   Jody [name] is an 44 y.o. female  [facility] due to reporting command hallucinations with a plan to slit her wrist. Pt stated "I was taking Olanzapine but  I stopped talking it because it made me gain weight". "That's the pill that helps with the voices". "[facility] I just want to see the doctor so they could switch me out and change me to a different medication". Per IVC papers pt presented to therapy session with increased depression and suicidal thoughts. Pt reported that she was hearing voices that were telling her to cut herself. Pt denies SI at this time but reported that she had thoughts earlier today. "I felt hopeless this morning but I don't feel like that now". "I took my meds and I feel much better". Pt did not report any previous suicide attempts or psychiatric hospitalizations. Pt reported that she is [facility]. [facility] similar symptoms. Pt reported that she is sleeping approximately 4-5 hours per night and did not report any issues with her appetite. Pt reported that she has gained over 50lbs and stated "the weight is hard to get off". Pt denies HI and AVH at this time. Pt stated "I was hearing voices earlier today. Pt did not report any illicit substance abuse but shared that she drinks every other day. Pt did not report a history of physical, sexual or emotional abuse at this time.  Pt meets inpatient treatment at this time.   Axis I: Schizophrenia  Past Medical History:  Past Medical History  Diagnosis Date  . Cystitis, interstitial   . Anxiety and depression   . Panic attack   . Schizophrenia     Past Surgical History  Procedure Laterality Date  . Tubal ligation      Family History:  Family History  Problem Relation Age of Onset  . Diabetes Father   . Hypertension Mother   . Hypertension Sister   . Lupus Sister   . Heart disease Maternal  Grandfather   . Lupus      Social History:  reports that she has been smoking Cigarettes.  She has a 1 pack-year smoking history. She does not have any smokeless tobacco history on file. She reports that she drinks alcohol. Her drug history is not on file.  Additional Social History:  Alcohol / Drug Use History of alcohol / drug use?: Yes Substance #1 Name of Substance 1: Alcohol  1 - Age of First Use: "20's" 1 - Amount (size/oz): 2-12oz 1 - Frequency: "every other day" 1 - Duration: ongoing  1 - Last Use / Amount: 01-29-15  CIWA: CIWA-Ar BP: 127/96 mmHg Pulse Rate: 70 COWS:    PATIENT STRENGTHS: (choose at least two) Average or above average intelligence Capable of independent living  Allergies:  Allergies  Allergen Reactions  . Penicillins Itching and Rash    Home Medications:  (Not in a hospital admission)  OB/GYN Status:  No LMP recorded.  General Assessment Data Location of Assessment: [facility] ED [name] Assessment: In system Is this a Tele or Face-to-Face Assessment?: Face-to-Face Is this an Initial Assessment or a Re-assessment for this encounter?: Initial Assessment Living Arrangements: Children Can pt return to current living arrangement?: Yes Admission Status: Involuntary Is patient capable of signing voluntary admission?: Yes Transfer from: Other (Comment) [facility] ) Referral Source: Other Arts administrator(Therapist)     Crisis Care Plan Living  Arrangements: Children Name of Psychiatrist: [facility] Name of Therapist: [facility]   Education Status Is patient currently in school?: No  Risk to self with the past 6 months Suicidal Ideation: No (Pt denies but repoorted AH with command to slit wrist ) Has patient been a risk to self within the past 6 months prior to admission? : Yes (AH with command to slit wrist) Suicidal Intent: No Has patient had any suicidal intent within the past 6 months prior to admission? : No Is patient at risk for suicide?: No Suicidal Plan?: No Has  patient had any suicidal plan within the past 6 months prior to admission? : Yes Access to Means: Yes Specify Access to Suicidal Means: Pt has access to knives  What has been your use of drugs/alcohol within the last 12 months?: Pt reports alcohol use every other day.  Previous Attempts/Gestures: No How many times?: 0 Other Self Harm Risks: No other self harm risk identified at this time.  Triggers for Past Attempts: None known Intentional Self Injurious Behavior: None Family Suicide History: No Recent stressful life event(s): Other (Comment) (Medication causing weight gain ) Persecutory voices/beliefs?: Yes Depression: No Depression Symptoms: Isolating, Fatigue, Loss of interest in usual pleasures Substance abuse history and/or treatment for substance abuse?: Yes Suicide prevention information given to non-admitted patients: Not applicable  Risk to Others within the past 6 months Homicidal Ideation: No Does patient have any lifetime risk of violence toward others beyond the six months prior to admission? : Unknown Thoughts of Harm to Others: No Current Homicidal Intent: No Current Homicidal Plan: No Access to Homicidal Means: No Describe Access to Homicidal Means: NA Identified Victim: NA History of harm to others?: No Assessment of Violence: On admission Violent Behavior Description: No violent behaviors observed. Pt is calm and cooperative at this time.  Does patient have access to weapons?: No Criminal Charges Pending?: No Does patient have a court date: No Is patient on probation?: No  Psychosis Hallucinations: Auditory, With command (To slit wrist) Delusions: None noted  Mental Status Report Appearance/Hygiene: In scrubs Eye Contact: Good Motor Activity: Freedom of movement Speech: Logical/coherent, Soft Level of Consciousness: Alert Mood: Pleasant, Euthymic Affect: Appropriate to circumstance Anxiety Level: None Thought Processes: Coherent, Relevant Judgement:  Unimpaired Orientation: Appropriate for developmental age Obsessive Compulsive Thoughts/Behaviors: None  Cognitive Functioning Concentration: Fair Memory: Recent Intact, Remote Intact IQ: Average Insight: Fair Impulse Control: Good Appetite: Good Weight Loss: 0 Weight Gain: 50 (From December to present ) Sleep: Decreased Total Hours of Sleep: 5 Vegetative Symptoms: Staying in bed  ADLScreening [facility] Assessment Services) Patient's cognitive ability adequate to safely complete daily activities?: Yes Patient able to express need for assistance with ADLs?: Yes Independently performs ADLs?: Yes (appropriate for developmental age)  Prior Inpatient Therapy Prior Inpatient Therapy: Yes Prior Therapy Dates: 10/15; 12/15 Prior Therapy Facilty/Provider(s): [facility] Reason for Treatment: Hallucinations   Prior Outpatient Therapy Prior Outpatient Therapy: Yes Prior Therapy Dates: Current  Prior Therapy Facilty/Provider(s): [facility]  Reason for Treatment: Schizophrenia  Does patient have an ACCT team?: No Does patient have Intensive In-House Services?  : No Does patient have [facility] services? : Yes Does patient have P4CC services?: No  ADL Screening (condition at time of admission) Patient's cognitive ability adequate to safely complete daily activities?: Yes Is the patient deaf or have difficulty hearing?: No Does the patient have difficulty seeing, even when wearing glasses/contacts?: No Does the patient have difficulty concentrating, remembering, or making decisions?: No Patient able to express need  for assistance with ADLs?: Yes Does the patient have difficulty dressing or bathing?: No Independently performs ADLs?: Yes (appropriate for developmental age) Does the patient have difficulty walking or climbing stairs?: No       Abuse/Neglect Assessment (Assessment to be complete while patient is alone) Physical Abuse: Denies Verbal Abuse: Denies Sexual Abuse:  Denies Exploitation of patient/patient's resources: Denies Self-Neglect: Denies     Merchant navy officer (For Healthcare) Does patient have an advance directive?: No    Additional Information 1:1 In Past 12 Months?: No CIRT Risk: No Elopement Risk: No Does patient have medical clearance?: Yes     Disposition: Inpatient treatment.   Disposition Initial Assessment Completed for this Encounter: Yes  Sorah Falkenstein S 01/29/2015 7:47 PM

## 2015-01-30 ENCOUNTER — Encounter (HOSPITAL_COMMUNITY): Payer: Self-pay | Admitting: Registered Nurse

## 2015-01-30 DIAGNOSIS — F209 Schizophrenia, unspecified: Secondary | ICD-10-CM | POA: Insufficient documentation

## 2015-01-30 DIAGNOSIS — R45851 Suicidal ideations: Secondary | ICD-10-CM

## 2015-01-30 MED ORDER — TRAZODONE HCL 100 MG PO TABS
100.0000 mg | ORAL_TABLET | Freq: Every evening | ORAL | Status: DC | PRN
  Administered 2015-01-30 – 2015-02-01 (×4): 100 mg via ORAL
  Filled 2015-01-30: qty 4
  Filled 2015-01-30 (×2): qty 1

## 2015-01-30 MED ORDER — BENZTROPINE MESYLATE 0.5 MG PO TABS
0.5000 mg | ORAL_TABLET | Freq: Every day | ORAL | Status: DC
  Administered 2015-01-30 – 2015-01-31 (×3): 0.5 mg via ORAL
  Filled 2015-01-30 (×5): qty 1

## 2015-01-30 MED ORDER — ARIPIPRAZOLE 10 MG PO TABS
10.0000 mg | ORAL_TABLET | Freq: Every day | ORAL | Status: DC
  Administered 2015-01-30 – 2015-02-01 (×3): 10 mg via ORAL
  Filled 2015-01-30 (×3): qty 1

## 2015-01-30 MED ORDER — MAGNESIUM HYDROXIDE 400 MG/5ML PO SUSP: 30.0000 mL | Freq: Every day | ORAL | Status: DC | PRN

## 2015-01-30 MED ORDER — OLANZAPINE 5 MG PO TBDP
5.0000 mg | ORAL_TABLET | Freq: Every day | ORAL | Status: DC
  Filled 2015-01-30 (×2): qty 1

## 2015-01-30 MED ORDER — NICOTINE 21 MG/24HR TD PT24
MEDICATED_PATCH | TRANSDERMAL | Status: AC
Start: 2015-01-30 — End: 2015-01-31
  Administered 2015-01-30: 21 mg via TRANSDERMAL
  Filled 2015-01-30: qty 1

## 2015-01-30 MED ORDER — OLANZAPINE 5 MG PO TBDP
15.0000 mg | ORAL_TABLET | Freq: Every day | ORAL | Status: DC
  Administered 2015-01-30: 15 mg via ORAL
  Filled 2015-01-30 (×4): qty 1

## 2015-01-30 MED ORDER — ACETAMINOPHEN 325 MG PO TABS
650.0000 mg | ORAL_TABLET | Freq: Four times a day (QID) | ORAL | Status: DC | PRN
  Administered 2015-02-01 – 2015-02-02 (×3): 650 mg via ORAL
  Filled 2015-01-30 (×3): qty 2

## 2015-01-30 MED ORDER — NICOTINE 21 MG/24HR TD PT24
21.0000 mg | MEDICATED_PATCH | Freq: Every day | TRANSDERMAL | Status: DC
  Administered 2015-01-30 – 2015-02-02 (×5): 21 mg via TRANSDERMAL
  Filled 2015-01-30 (×2): qty 1
  Filled 2015-01-30: qty 14
  Filled 2015-01-30 (×4): qty 1

## 2015-01-30 MED ORDER — FLUOXETINE HCL 20 MG PO CAPS
60.0000 mg | ORAL_CAPSULE | Freq: Every day | ORAL | Status: DC
  Administered 2015-01-30: 60 mg via ORAL
  Filled 2015-01-30 (×3): qty 3

## 2015-01-30 MED ORDER — BUSPIRONE HCL 5 MG PO TABS
5.0000 mg | ORAL_TABLET | Freq: Three times a day (TID) | ORAL | Status: DC
  Administered 2015-01-30 – 2015-02-02 (×11): 5 mg via ORAL
  Filled 2015-01-30: qty 12
  Filled 2015-01-30 (×2): qty 1
  Filled 2015-01-30: qty 12
  Filled 2015-01-30 (×10): qty 1
  Filled 2015-01-30: qty 12
  Filled 2015-01-30 (×2): qty 1

## 2015-01-30 MED ORDER — FLUOXETINE HCL 20 MG PO CAPS
40.0000 mg | ORAL_CAPSULE | Freq: Every day | ORAL | Status: DC
  Administered 2015-01-31 – 2015-02-02 (×3): 40 mg via ORAL
  Filled 2015-01-30: qty 2
  Filled 2015-01-30: qty 8
  Filled 2015-01-30 (×3): qty 2

## 2015-01-30 MED ORDER — ARIPIPRAZOLE 10 MG PO TABS
ORAL_TABLET | ORAL | Status: AC
Start: 2015-01-30 — End: 2015-01-30
  Filled 2015-01-30: qty 1

## 2015-01-30 MED ORDER — ALUM & MAG HYDROXIDE-SIMETH 200-200-20 MG/5ML PO SUSP: 30.0000 mL | ORAL | Status: DC | PRN

## 2015-01-30 MED ORDER — HYDROXYZINE HCL 25 MG PO TABS
25.0000 mg | ORAL_TABLET | Freq: Three times a day (TID) | ORAL | Status: DC | PRN
  Administered 2015-01-30 (×2): 25 mg via ORAL
  Filled 2015-01-30: qty 1
  Filled 2015-01-30: qty 6
  Filled 2015-01-30: qty 1

## 2015-01-30 NOTE — [facility] Suicide Risk Assessment (Signed)
[facility]   Nursing information obtained from:  Patient Demographic factors:  Unemployed Current Mental Status:  NA Loss Factors:  NA Historical Factors:  Family history of mental illness or substance abuse, Impulsivity Risk Reduction Factors:  Responsible for children under 42 years of age, Sense of responsibility to family, Living with another person, especially a relative, Positive therapeutic relationship, Positive social support Total Time spent with patient: 30 minutes Principal Problem: Schizophrenia, paranoid Diagnosis:   Patient Active Problem List   Diagnosis Date Noted  . Schizophrenia [F20.9] 01/30/2015  . Psychosis [F29] 09/13/2014  . Alcohol abuse [F10.10] 09/13/2014  . Schizophrenia, paranoid [F20.0] 09/12/2014  . 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  World Science writerHealth Organization [facility](WHO). Score between 0-7:  no or low risk or alcohol related problems. Score between 8-15:  moderate risk of alcohol related problems. Score between 16-19:  high risk of alcohol related problems. Score 20 or above:  warrants further diagnostic evaluation for alcohol dependence and treatment.   CLINICAL FACTORS:   Severe Anxiety and/or Agitation Alcohol/Substance Abuse/Dependencies Schizophrenia:   Command hallucinatons Depressive state More than one psychiatric diagnosis Previous Psychiatric Diagnoses and Treatments   Musculoskeletal: Strength & Muscle Tone: within normal limits Gait & Station: normal Patient leans: N/A  Psychiatric Specialty Exam: Physical Exam Full physical performed in Emergency Department. I have reviewed this assessment and concur with its findings.   ROS auditory hallucinations, suicidal ideation, denied  shortness of breath, chest pain, dizziness skin rash and generalized body pains and has no symptoms of rest of the review of systems   Blood pressure 129/93, pulse 91, temperature 98.2 F (36.8 C), temperature source Oral, resp. rate 18, height 5\' 3"  (1.6 m), weight 85.276 kg (188 lb), last menstrual period 01/04/2015.Body mass index is 33.31 kg/(m^2).  General Appearance: Guarded  Eye Contact::  Good  Speech:  Clear and Coherent  Volume:  Decreased  Mood:  Anxious, Depressed and Irritable  Affect:  Appropriate and Congruent  Thought Process:  Coherent and Goal Directed  Orientation:  Full (Time, Place, and Person)  Thought Content:  Delusions, Hallucinations: Auditory and Paranoid Ideation  Suicidal Thoughts:  Yes.  without intent/plan  Homicidal Thoughts:  No  Memory:  Immediate;   Fair Recent;   Fair  Judgement:  Impaired  Insight:  Fair  Psychomotor Activity:  Restlessness  Concentration:  Fair  Recall:  Good  Fund of Knowledge:Good  Language: Good  Akathisia:  Negative  Handed:  Right  AIMS (if indicated):     Assets:  Communication Skills Desire for Improvement Housing Leisure Time Physical Health Resilience Social Support Talents/Skills Transportation  Sleep:  Number of Hours: 4.75  Cognition: WNL  ADL's:  Intact     COGNITIVE FEATURES THAT CONTRIBUTE TO RISK:  Closed-mindedness, Loss of executive function, Polarized thinking and Thought constriction (tunnel vision)    SUICIDE RISK:   Moderate:  Frequent suicidal ideation with limited intensity, and duration, some specificity in terms of plans, no associated intent, good self-control, limited dysphoria/symptomatology, some risk factors present, and identifiable protective factors, including available and accessible social support.  PLAN OF CARE: Admit for auditory and visual hallucinations, suicidal ideation and noncompliant with her psychotropic medication.  Medical Decision Making:  Review of Psycho-Social  Stressors (1), Review or order clinical lab  tests (1), Established Problem, Worsening (2), Review of Medication Regimen & Side Effects (2) and Review of New Medication or Change in Dosage (2)  I certify that inpatient services furnished can reasonably be expected to improve the patient's condition.   Ayriel Texidor,JANARDHAHA R. 01/30/2015, 2:54 PM

## 2015-01-30 NOTE — Progress Notes (Addendum)
Patient ID: Jody SpruceRosalind L Burbach, female   DOB: [birth year], 44 y.o.   MRN: [id number]   D: Pt has been very flat and depressed on the unit today. Pt reported to this writer that she did no want to take Zyprexa anymore, because she gained 50 pounds. Pt reported that she just wanted something new. Shavon NP was made aware of patients concerns, patient was started on Abilify.  Pt reported that her depression was a 2, her hopelessness was a 2, and that her anxiety was a 1. Pt reported being negative SI/HI, no AH/VH noted. Pt reported that her goal for today was to get proper meds. A: 15 min checks continued for patient safety. R: Pt safety maintained.

## 2015-01-30 NOTE — Progress Notes (Signed)
Adult Psychoeducational Group Note  Date:  01/30/2015 Time:  8:52 PM  Group Topic/Focus:  Wrap-Up Group:   The focus of this group is to help patients review their daily goal of treatment and discuss progress on daily workbooks.  Participation Level:  Minimal  Participation Quality:  Appropriate  Affect:  Flat  Cognitive:  Alert  Insight: Appropriate  Engagement in Group:  Engaged  Modes of Intervention:  Discussion  Additional Comments:  Pt stated that she has had a good day. Her med adjustments seem to be working for her and she is feeling good about that.   Kaleen OdeaCOOKE, Galileo Colello R 01/30/2015, 8:52 PM

## 2015-01-30 NOTE — Progress Notes (Signed)
D: When asked about her day, pt stated, "my mind is racing. Today is the first day they started me on abilify". Pt informed the writer that she spent most of her day in bed, but admitted to going to group and lunch. Pt admitted that  she stays in her room, but said it doesn't have anything to do with staff or peers. States that she prefers to be alone even on the outside of the hosp. Pt voiced no questions or concerns.  A:  Support and encouragement was offered. 15 min checks continued for safety.  R: Pt remains safe.
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
## 2015-01-30 NOTE — [facility] Group Notes (Signed)
[facility] Group Notes:  (Clinical Social Work)  01/30/2015  11:15-12:00PM  Summary of Progress/Problems:   The main focus of today's process group was to discuss patients' feelings related to being hospitalized, as well as the difference between "being" and "having" a mental health diagnosis.  It was agreed in general by the group that it would be preferable to avoid future hospitalizations, and we discussed means of doing that.  As a follow-up, problems with adhering to medication recommendations were discussed.  The patient expressed their primary feeling about being hospitalized is "comfortable."  She did not speak in group much, was taken out by a provider for over half of group.  Type of Therapy:  Group Therapy - Process  Participation Level:  Minimal  Participation Quality:  Attentive  Affect:  Blunted  Cognitive:  Alert  Insight:  Improving  Engagement in Therapy:  Improving  Modes of Intervention:  Exploration, Discussion  Ambrose MantleMareida Grossman-Orr, LCSW 01/30/2015, 12:24 PM

## 2015-01-30 NOTE — Progress Notes (Signed)
Patient ID: Jody SpruceRosalind L Ludvigsen, female   DOB: 06-30-1971, 44 y.o.   MRN: [id number]   Pt was pleasant and cooperative during the adm process. When asked the circumstances surrounding her adm pt stated "[facility]. They told me to come in. They asked me some questions, then they sent me to the crisis center." Pt stated [facility] and [facility]. Pt stated that she stopped taking her zyprexa four days ago because she's gained 50 lbs since starting the medication. Stated she wants to have her med changed, then she will "gladly take her medication. Report states that pt was having a/H to cut her wrists.  Pt denied SI, HI, A/V during the adm process.

## 2015-01-30 NOTE — [facility] Group Notes (Signed)
[facility] Group Notes:  (Nursing/MHT/Case Management/Adjunct)  Date:  01/30/2015  Time:  11:46 AM  Type of Therapy:  Psychoeducational Skills  Participation Level:  Did Not Attend  Participation Quality:  Did Not Attend  Affect:  Did Not Attend  Cognitive:  Did Not Attend  Insight:  None  Engagement in Group:  Did Not Attend  Modes of Intervention:  Did Not Attend  Summary of Progress/Problems: Pt did not attend patient self inventory group.   Jacquelyne BalintForrest, Manasa Spease Shanta 01/30/2015, 11:46 AM

## 2015-01-30 NOTE — H&P (Signed)
Psychiatric Admission Assessment Adult  Patient Identification: Jody [name] MRN:  [id number] Date of Evaluation:  01/30/2015 Chief Complaint:  SCHIZOPHRENIA Principal Diagnosis: Schizophrenia, paranoid Diagnosis:   Patient Active Problem List   Diagnosis Date Noted  . Schizophrenia [F20.9] 01/30/2015  . Psychosis [F29] 09/13/2014  . Alcohol abuse [F10.10] 09/13/2014  . Schizophrenia, paranoid [F20.0] 09/12/2014  . 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  I was hearing voices and feeling depressed; and the times I was drinking alcohol to self medicate.  I think that's where the depression stepped in at." Patient states that she was having suicidal thoughts; denies that she had a plan.  States that the voices was telling her to slit her wrist; that she was worthless.  Patient states that she is feeling much better today.  At this time patient denies suicidal/homicidal ideation and psychosis.  Patient states that she is currently feeling paranoid "sometimes I feel like people are talking about me or watching me on the TV or radio; and when I'm driving people I feel like people are leaving me messages on their licence plate; when a plan go by I feel like they are sending messages to my phone and I feeling like my phone is tapped. I got that real bad.  I don't like to watch TV and if I do I do it with the volume down and I don't listen to the radio."     Patient state recently she has been drinking 2 12 oz bottles about every other day.  States that when she doesn't drink she is able to function better and denies withdrawal symptoms.   Patient lives in [place] with her 33 yr daughter.  Patient stats that she is unemployed; states that she is supported by  family and friends.   Outpatient services with [facility]; last visit yesterday.  "I went because I was hearing voices, they sent me to [place] and [place] sent me here.  Elements:  Location:  Worsening depression. Quality:  Auditory halluciniation. Severity:  Severe. Duration:  Couple weeks. Associated Signs/Symptoms: Depression Symptoms:  depressed mood, feelings of worthlessness/guilt, difficulty concentrating, hopelessness, recurrent thoughts of death, anxiety, weight gain, (Hypo) Manic Symptoms:  Distractibility, Hallucinations, Irritable Mood, Anxiety Symptoms:  Excessive Worry, Psychotic Symptoms:  Hallucinations: Auditory Command:  Voices telling her to cut her wrist Paranoia, PTSD Symptoms: Denies Total Time spent with patient: 1 hour  Past Medical History:  Past Medical History  Diagnosis Date  . Cystitis, interstitial   . Anxiety and depression   . Panic attack   . Schizophrenia     Past Surgical History  Procedure Laterality Date  . Tubal ligation     Family History:  Family History  Problem Relation Age of Onset  . Diabetes Father   . Hypertension Mother   . Hypertension Sister   . Lupus Sister   . Heart disease Maternal Grandfather   . Lupus     Social History:  History  Alcohol Use  . Yes    Comment: 6 pk beer     History  Drug Use No    History   Social History  . Marital Status: Single    Spouse Name: N/A  . Number of Children: N/A  . Years  of Education: N/A   Social History Main Topics  . Smoking status: Current Every Day Smoker -- 0.25 packs/day for 4 years    Types: Cigarettes  . Smokeless tobacco: Not on file  . Alcohol Use: Yes     Comment: 6 pk beer  . Drug Use: No  . Sexual Activity: Yes    Birth Control/ Protection: Condom   Other Topics Concern  . None   Social History Narrative   Additional Social History:    Pain Medications: see mar Prescriptions: see mar Over the Counter: see mar History of alcohol / drug  use?: Yes Longest period of sobriety (when/how long): none drinks "every day or every other day'   Musculoskeletal: Strength & Muscle Tone: within normal limits Gait & Station: normal Patient leans: N/A  Psychiatric Specialty Exam: Physical Exam  Review of Systems  Psychiatric/Behavioral: Positive for depression, hallucinations ("Voices that I'm not worth") and memory loss ("It's bad; I can put something down and forger; just normal day to day activities I just forget; it's real bad"). Suicidal ideas: "Not today"  States that she was having SI thoughts prior to admission. Substance abuse: Denies. The patient is nervous/anxious. The patient does not have insomnia.     Blood pressure 129/93, pulse 91, temperature 98.2 F (36.8 C), temperature source Oral, resp. rate 18, height $RemoveBe'5\' 3"'MRmuKpZPc$  (1.6 m), weight 85.276 kg (188 lb), last menstrual period 01/04/2015.Body mass index is 33.31 kg/(m^2).  General Appearance: Casual  Eye Contact::  Good  Speech:  Clear and Coherent and Normal Rate  Volume:  Normal  Mood:  Depressed  Affect:  Congruent  Thought Process:  Circumstantial and Goal Directed  Orientation:  Full (Time, Place, and Person)  Thought Content:  Hallucinations: Auditory Command:  Voices telling her to cut her wrist and Paranoid Ideation  Suicidal Thoughts:  Denies at this time; states that she was prior to admission  Homicidal Thoughts:  No  Memory:  Immediate;   Fair Recent;   Fair Remote;   Fair  Judgement:  Fair  Insight:  Present  Psychomotor Activity:  Normal  Concentration:  Fair  Recall:  [employer] of Knowledge:Good  Language: Good  Akathisia:  No  Handed:  Right  AIMS (if indicated):     Assets:  Communication Skills Desire for Improvement Housing Resilience Social Support  ADL's:  Intact  Cognition: WNL  Sleep:  Number of Hours: 4.75   Risk to Self:   Risk to Others:   Prior Inpatient Therapy:   Prior Outpatient Therapy:    Alcohol Screening: 1. How  often do you have a drink containing alcohol?: 4 or more times a week 2. How many drinks containing alcohol do you have on a typical day when you are drinking?: 1 or 2 3. How often do you have six or more drinks on one occasion?: Monthly Preliminary Score: 2 4. How often during the last year have you found that you were not able to stop drinking once you had started?: Never 5. How often during the last year have you failed to do what was normally expected from you becasue of drinking?: Never 6. How often during the last year have you needed a first drink in the morning to get yourself going after a heavy drinking session?: Weekly 7. How often during the last year have you had a feeling of guilt of remorse after drinking?: Never 8. How often during the last year have you been unable to remember what  happened the night before because you had been drinking?: Never 9. Have you or someone else been injured as a result of your drinking?: No 10. Has a relative or friend or a doctor or another health worker been concerned about your drinking or suggested you cut down?: No Alcohol Use Disorder Identification Test Final Score (AUDIT): 9 Brief Intervention: Patient declined brief intervention  Allergies:   Allergies  Allergen Reactions  . Penicillins Itching and Rash   Lab Results:  Results for orders placed or performed during the hospital encounter of 01/29/15 (from the past 48 hour(s))  CBC WITH DIFFERENTIAL     Status: Abnormal   Collection Time: 01/29/15  6:21 PM  Result Value Ref Range   WBC 4.2 4.0 - 10.5 K/uL   RBC 5.43 (H) 3.87 - 5.11 MIL/uL   Hemoglobin 12.7 12.0 - 15.0 g/dL   HCT 40.6 36.0 - 46.0 %   MCV 74.8 (L) 78.0 - 100.0 fL   MCH 23.4 (L) 26.0 - 34.0 pg   MCHC 31.3 30.0 - 36.0 g/dL   RDW 15.0 11.5 - 15.5 %   Platelets 280 150 - 400 K/uL   Neutrophils Relative % 48 43 - 77 %   Lymphocytes Relative 44 12 - 46 %   Monocytes Relative 6 3 - 12 %   Eosinophils Relative 2 0 - 5 %    Basophils Relative 0 0 - 1 %   Neutro Abs 2.0 1.7 - 7.7 K/uL   Lymphs Abs 1.8 0.7 - 4.0 K/uL   Monocytes Absolute 0.3 0.1 - 1.0 K/uL   Eosinophils Absolute 0.1 0.0 - 0.7 K/uL   Basophils Absolute 0.0 0.0 - 0.1 K/uL   Smear Review MORPHOLOGY UNREMARKABLE   Comprehensive metabolic panel     Status: Abnormal   Collection Time: 01/29/15  6:21 PM  Result Value Ref Range   Sodium 141 135 - 145 mmol/L   Potassium 3.7 3.5 - 5.1 mmol/L   Chloride 108 96 - 112 mmol/L   CO2 24 19 - 32 mmol/L   Glucose, Bld 107 (H) 70 - 99 mg/dL   BUN 8 6 - 23 mg/dL   Creatinine, Ser 0.94 0.50 - 1.10 mg/dL   Calcium 9.3 8.4 - 10.5 mg/dL   Total Protein 7.3 6.0 - 8.3 g/dL   Albumin 4.2 3.5 - 5.2 g/dL   AST 25 0 - 37 U/L   ALT 20 0 - 35 U/L   Alkaline Phosphatase 70 39 - 117 U/L   Total Bilirubin 0.7 0.3 - 1.2 mg/dL   GFR calc non Af Amer 74 (L) >90 mL/min   GFR calc Af Amer 86 (L) >90 mL/min    Comment: (NOTE) The eGFR has been calculated using the CKD EPI equation. This calculation has not been validated in all clinical situations. eGFR's persistently <90 mL/min signify possible Chronic Kidney Disease.    Anion gap 9 5 - 15  Ethanol     Status: None   Collection Time: 01/29/15  6:21 PM  Result Value Ref Range   Alcohol, Ethyl (B) <5 0 - 9 mg/dL    Comment:        LOWEST DETECTABLE LIMIT FOR SERUM ALCOHOL IS 11 mg/dL FOR MEDICAL PURPOSES ONLY   Acetaminophen level     Status: Abnormal   Collection Time: 01/29/15  6:21 PM  Result Value Ref Range   Acetaminophen (Tylenol), Serum <10.0 (L) 10 - 30 ug/mL    Comment:  THERAPEUTIC CONCENTRATIONS VARY SIGNIFICANTLY. A RANGE OF 10-30 ug/mL MAY BE AN EFFECTIVE CONCENTRATION FOR MANY PATIENTS. HOWEVER, SOME ARE BEST TREATED AT CONCENTRATIONS OUTSIDE THIS RANGE. ACETAMINOPHEN CONCENTRATIONS >150 ug/mL AT 4 HOURS AFTER INGESTION AND >50 ug/mL AT 12 HOURS AFTER INGESTION ARE OFTEN ASSOCIATED WITH TOXIC REACTIONS.   Salicylate level      Status: None   Collection Time: 01/29/15  6:21 PM  Result Value Ref Range   Salicylate Lvl <7.1 2.8 - 20.0 mg/dL  Drug screen panel, emergency     Status: None   Collection Time: 01/29/15  8:06 PM  Result Value Ref Range   Opiates NONE DETECTED NONE DETECTED   Cocaine NONE DETECTED NONE DETECTED   Benzodiazepines NONE DETECTED NONE DETECTED   Amphetamines NONE DETECTED NONE DETECTED   Tetrahydrocannabinol NONE DETECTED NONE DETECTED   Barbiturates NONE DETECTED NONE DETECTED    Comment:        DRUG SCREEN FOR MEDICAL PURPOSES ONLY.  IF CONFIRMATION IS NEEDED FOR ANY PURPOSE, NOTIFY LAB WITHIN 5 DAYS.        LOWEST DETECTABLE LIMITS FOR URINE DRUG SCREEN Drug Class       Cutoff (ng/mL) Amphetamine      1000 Barbiturate      200 Benzodiazepine   219 Tricyclics       758 Opiates          300 Cocaine          300 THC              50    Current Medications: Current Facility-Administered Medications  Medication Dose Route Frequency Provider Last Rate Last Dose  . acetaminophen (TYLENOL) tablet 650 mg  650 mg Oral Q6H PRN Harriet Butte, NP      . alum & mag hydroxide-simeth (MAALOX/MYLANTA) 200-200-20 MG/5ML suspension 30 mL  30 mL Oral Q4H PRN Harriet Butte, NP      . ARIPiprazole (ABILIFY) 10 MG tablet           . ARIPiprazole (ABILIFY) tablet 10 mg  10 mg Oral Daily Shuvon B Rankin, NP   10 mg at 01/30/15 1033  . benztropine (COGENTIN) tablet 0.5 mg  0.5 mg Oral QHS Harriet Butte, NP   0.5 mg at 01/30/15 0032  . busPIRone (BUSPAR) tablet 5 mg  5 mg Oral TID Harriet Butte, NP   5 mg at 01/30/15 0746  . [START ON 01/31/2015] FLUoxetine (PROZAC) capsule 40 mg  40 mg Oral Daily Shuvon B Rankin, NP      . hydrOXYzine (ATARAX/VISTARIL) tablet 25 mg  25 mg Oral TID PRN Harriet Butte, NP      . magnesium hydroxide (MILK OF MAGNESIA) suspension 30 mL  30 mL Oral Daily PRN Harriet Butte, NP      . traZODone (DESYREL) tablet 100 mg  100 mg Oral QHS PRN Harriet Butte, NP   100  mg at 01/30/15 0033   PTA Medications: Prescriptions prior to admission  Medication Sig Dispense Refill Last Dose  . benztropine (COGENTIN) 0.5 MG tablet Take 1 tablet (0.5 mg total) by mouth at bedtime. 30 tablet 0 01/29/2015 at Unknown time  . busPIRone (BUSPAR) 5 MG tablet Take 1 tablet (5 mg total) by mouth 3 (three) times daily. 90 tablet 0 01/29/2015 at Unknown time  . FLUoxetine (PROZAC) 20 MG capsule Take 3 capsules (60 mg total) by mouth daily. 90 capsule 0 01/29/2015 at Unknown time  . hydrocortisone  2.5 % cream Apply topically 2 (two) times daily. 30 g 0 01/29/2015 at Unknown time  . hydrOXYzine (ATARAX/VISTARIL) 25 MG tablet Take 1 tablet (25 mg total) by mouth every 4 (four) hours as needed for anxiety (Sleep). (Patient taking differently: Take 25 mg by mouth 3 (three) times daily as needed for anxiety (Sleep). ) 30 tablet 0 01/29/2015 at Unknown time  . OLANZapine zydis (ZYPREXA) 5 MG disintegrating tablet Take 1 tablet (5 mg total) by mouth daily. 30 tablet 0 01/29/2015 at Unknown time  . carbamide peroxide (DEBROX) 6.5 % otic solution Place 5 drops into both ears 2 (two) times daily. (Patient not taking: Reported on 01/29/2015) 15 mL 1 Completed Course at Unknown time  . cephALEXin (KEFLEX) 500 MG capsule Take 1 capsule (500 mg total) by mouth 4 (four) times daily. (Patient not taking: Reported on 01/29/2015) 20 capsule 0 Completed Course at Unknown time  . Multiple Vitamin (MULTIVITAMIN WITH MINERALS) TABS tablet Take 1 tablet by mouth daily. (Patient not taking: Reported on 01/29/2015)   Not Taking at Unknown time  . OLANZapine zydis (ZYPREXA) 15 MG disintegrating tablet Take 1 tablet (15 mg total) by mouth at bedtime. 30 tablet 0 01/25/2015  . phenazopyridine (PYRIDIUM) 200 MG tablet Take 1 tablet (200 mg total) by mouth 3 (three) times daily. (Patient not taking: Reported on 01/29/2015) 6 tablet 0 Completed Course at Unknown time  . Vitamin D, Ergocalciferol, (DRISDOL) 50000 UNITS CAPS  capsule Take 1 capsule (50,000 Units total) by mouth every 7 (seven) days. (Patient not taking: Reported on 01/29/2015) 12 capsule 0 Not Taking at Unknown time  . zolpidem (AMBIEN) 5 MG tablet Take 1 tablet (5 mg total) by mouth at bedtime. For insomnia (Patient not taking: Reported on 01/29/2015) 5 tablet 0 Not Taking at Unknown time  . zolpidem (AMBIEN) 5 MG tablet Take 1 tablet (5 mg total) by mouth at bedtime as needed for sleep. (Patient not taking: Reported on 01/29/2015) 4 tablet 0 Not Taking at Unknown time    Previous Psychotropic Medications: Yes   Substance Abuse History in the last 12 months:  No.    Consequences of Substance Abuse: Denies  Results for orders placed or performed during the hospital encounter of 01/29/15 (from the past 72 hour(s))  CBC WITH DIFFERENTIAL     Status: Abnormal   Collection Time: 01/29/15  6:21 PM  Result Value Ref Range   WBC 4.2 4.0 - 10.5 K/uL   RBC 5.43 (H) 3.87 - 5.11 MIL/uL   Hemoglobin 12.7 12.0 - 15.0 g/dL   HCT 40.6 36.0 - 46.0 %   MCV 74.8 (L) 78.0 - 100.0 fL   MCH 23.4 (L) 26.0 - 34.0 pg   MCHC 31.3 30.0 - 36.0 g/dL   RDW 15.0 11.5 - 15.5 %   Platelets 280 150 - 400 K/uL   Neutrophils Relative % 48 43 - 77 %   Lymphocytes Relative 44 12 - 46 %   Monocytes Relative 6 3 - 12 %   Eosinophils Relative 2 0 - 5 %   Basophils Relative 0 0 - 1 %   Neutro Abs 2.0 1.7 - 7.7 K/uL   Lymphs Abs 1.8 0.7 - 4.0 K/uL   Monocytes Absolute 0.3 0.1 - 1.0 K/uL   Eosinophils Absolute 0.1 0.0 - 0.7 K/uL   Basophils Absolute 0.0 0.0 - 0.1 K/uL   Smear Review MORPHOLOGY UNREMARKABLE   Comprehensive metabolic panel     Status: Abnormal   Collection Time:  01/29/15  6:21 PM  Result Value Ref Range   Sodium 141 135 - 145 mmol/L   Potassium 3.7 3.5 - 5.1 mmol/L   Chloride 108 96 - 112 mmol/L   CO2 24 19 - 32 mmol/L   Glucose, Bld 107 (H) 70 - 99 mg/dL   BUN 8 6 - 23 mg/dL   Creatinine, Ser 0.94 0.50 - 1.10 mg/dL   Calcium 9.3 8.4 - 10.5 mg/dL    Total Protein 7.3 6.0 - 8.3 g/dL   Albumin 4.2 3.5 - 5.2 g/dL   AST 25 0 - 37 U/L   ALT 20 0 - 35 U/L   Alkaline Phosphatase 70 39 - 117 U/L   Total Bilirubin 0.7 0.3 - 1.2 mg/dL   GFR calc non Af Amer 74 (L) >90 mL/min   GFR calc Af Amer 86 (L) >90 mL/min    Comment: (NOTE) The eGFR has been calculated using the CKD EPI equation. This calculation has not been validated in all clinical situations. eGFR's persistently <90 mL/min signify possible Chronic Kidney Disease.    Anion gap 9 5 - 15  Ethanol     Status: None   Collection Time: 01/29/15  6:21 PM  Result Value Ref Range   Alcohol, Ethyl (B) <5 0 - 9 mg/dL    Comment:        LOWEST DETECTABLE LIMIT FOR SERUM ALCOHOL IS 11 mg/dL FOR MEDICAL PURPOSES ONLY   Acetaminophen level     Status: Abnormal   Collection Time: 01/29/15  6:21 PM  Result Value Ref Range   Acetaminophen (Tylenol), Serum <10.0 (L) 10 - 30 ug/mL    Comment:        THERAPEUTIC CONCENTRATIONS VARY SIGNIFICANTLY. A RANGE OF 10-30 ug/mL MAY BE AN EFFECTIVE CONCENTRATION FOR MANY PATIENTS. HOWEVER, SOME ARE BEST TREATED AT CONCENTRATIONS OUTSIDE THIS RANGE. ACETAMINOPHEN CONCENTRATIONS >150 ug/mL AT 4 HOURS AFTER INGESTION AND >50 ug/mL AT 12 HOURS AFTER INGESTION ARE OFTEN ASSOCIATED WITH TOXIC REACTIONS.   Salicylate level     Status: None   Collection Time: 01/29/15  6:21 PM  Result Value Ref Range   Salicylate Lvl <4.1 2.8 - 20.0 mg/dL  Drug screen panel, emergency     Status: None   Collection Time: 01/29/15  8:06 PM  Result Value Ref Range   Opiates NONE DETECTED NONE DETECTED   Cocaine NONE DETECTED NONE DETECTED   Benzodiazepines NONE DETECTED NONE DETECTED   Amphetamines NONE DETECTED NONE DETECTED   Tetrahydrocannabinol NONE DETECTED NONE DETECTED   Barbiturates NONE DETECTED NONE DETECTED    Comment:        DRUG SCREEN FOR MEDICAL PURPOSES ONLY.  IF CONFIRMATION IS NEEDED FOR ANY PURPOSE, NOTIFY LAB WITHIN 5 DAYS.        LOWEST  DETECTABLE LIMITS FOR URINE DRUG SCREEN Drug Class       Cutoff (ng/mL) Amphetamine      1000 Barbiturate      200 Benzodiazepine   962 Tricyclics       229 Opiates          300 Cocaine          300 THC              50     Observation Level/Precautions:  15 minute checks  Laboratory:  CBC Chemistry Profile UDS UA  Psychotherapy:  Individual and group sessions  Medications:  Will start add/adjust medications as appropriate for patient stabilization  Consultations:  Psychiatry  Discharge Concerns:  Safety, stabilization, and risk of access to medication and medication stabilization   Estimated LOS:  5-7 days  Other:     Psychological Evaluations: Yes   Treatment Plan Summary: Daily contact with patient to assess and evaluate symptoms and progress in treatment and Medication management  1. Admit for crisis management and stabilization 2. Medication management to reduce current symptoms to bale line and improve the patient's  overall level of functioning:  Discontinued Zyprexa.  Decreased Prozac to 40 mg daily;  Started Abilify 10 mg daily; Continued Cogentin 0.5 mg, Buspar 5 mg, Vistaril 25 mg  PRN and Trazodone 100 mg 3. Treat health problems as indicated 4. Develop treatment plan to decrease risk of relapse upon discharge and the need for  readmission. 5. Psycho-social education regarding relapse prevention and self care. 6. Health care follow up as needed for medical problems 7. Restart home medications where appropriate.    Medical Decision Making:  Established Problem, Stable/Improving (1), Review of Psycho-Social Stressors (1), Review or order clinical lab tests (1), Review or order medicine tests (1), Independent Review of image, tracing or specimen (2) and Review of Medication Regimen & Side Effects (2)  I certify that inpatient services furnished can reasonably be expected to improve the patient's condition.   Rankin, Shuvon, FNP-BC 4/30/201610:52 AM   Patient seen  face to face for this evaluation, she presented paranoia, worsening of depression and suicide ideation since she stopped taking her medication due to significant weight gain, case discussed with physician extender, completed suicide risk assessment and formulated treatment plan. Reviewed the information documented and agree with the treatment plan.  Fay Bagg,JANARDHAHA R. 01/30/2015 4:48 PM

## 2015-01-31 DIAGNOSIS — F200 Paranoid schizophrenia: Secondary | ICD-10-CM

## 2015-01-31 NOTE — Progress Notes (Signed)
[facility] Group Notes:  (Nursing/MHT/Case Management/Adjunct)  Date:  01/31/2015  Time:  8:46 PM  Type of Therapy:  Psychoeducational Skills  Participation Level:  Active  Participation Quality:  Appropriate  Affect:  Appropriate  Cognitive:  Appropriate  Insight:  Good  Engagement in Group:  Engaged  Modes of Intervention:  Education  Summary of Progress/Problems: Patient states that she had a "pretty good" day overall. She explained that her mind is no longer racing and that she is not suicidal.   Derrill KayGOODMAN, Darling Cieslewicz S 01/31/2015, 8:46 PM

## 2015-01-31 NOTE — Progress Notes (Signed)
Patient ID: [name], female   DOB: 07-19-1971, 44 y.o.   MRN: [id number]   D: Pt continues to be very flat and depressed on the unit. Pt attends group and engages, but in her free time she isolates to her room. Pt reported that her goal for today was to make sure to take her medication. Pt reported that her depression was a 2, her hopelessness was a 0, and that her anxiety was a 2. Pt reported being negative SI/HI, no AH/VH noted. A: 15 min checks continued for patient safety. R: Pt safety maintained.

## 2015-01-31 NOTE — [facility] Group Notes (Signed)
[facility] Group Notes:  (Clinical Social Work)  01/31/2015  [facility] Group Notes:  (Clinical Social Work)  01/31/2015  11:00AM-12:00PM  Summary of Progress/Problems:  The main focus of today's process group was to listen to a variety of genres of music and to identify that different types of music provoke different responses.  The patient then was able to identify personally what was soothing for them, as well as energizing.  Handouts were used to record feelings evoked, as well as how patient can personally use this knowledge in sleep habits, with depression, and with other symptoms.  The patient expressed understanding of concepts, as well as knowledge of how each type of music affected him/her and how this can be used at home as a wellness/recovery tool.  Type of Therapy:  Music Therapy   Participation Level:  Active  Participation Quality:  Attentive and Sharing  Affect:  Blunted at times, appropriate at times  Cognitive:  Oriented  Insight:  Engaged  Engagement in Therapy:  Engaged  Modes of Intervention:   Activity, Exploration  Ambrose MantleMareida Grossman-Orr, LCSW 01/31/2015

## 2015-01-31 NOTE — [facility] Group Notes (Signed)
[facility] Group Notes:  (Nursing/MHT/Case Management/Adjunct)  Date:  01/31/2015  Time:  3:00 PM  Type of Therapy:  Psychoeducational Skills  Participation Level:  Active  Participation Quality:  Appropriate  Affect:  Appropriate  Cognitive:  Appropriate  Insight:  Appropriate  Engagement in Group:  Engaged  Modes of Intervention:  Discussion  Summary of Progress/Problems: Pt did attend self inventory group.   Joffrey Kerce Shanta 01/31/2015, 3:00 PM 

## 2015-01-31 NOTE — Progress Notes (Signed)
[facility] MD Progress Note  01/31/2015 10:14 AM Jody [name]  MRN:  [id number]    Subjective:  Patient states "I'm doing good.  I'm not hearing the voices."   Objective:  Patient seen and chart reviewed.  Discussed with treatment team.  Patient is tolerating medications with out adverse reactions and she is participating in group sessions.  Patient continue to have some paranoia and wishes to stay in her room only to come out for meals, medication, and group sessions.  Chronic history of hearing voices on TV and Radio.     Principal Problem: Schizophrenia, paranoid Diagnosis:   Patient Active Problem List   Diagnosis Date Noted  . Schizophrenia [F20.9] 01/30/2015  . Suicidal ideation [R45.851]   . Psychosis [F29] 09/13/2014  . Alcohol abuse [F10.10] 09/13/2014  . Schizophrenia, paranoid [F20.0] 09/12/2014  . 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 Cystitis, interstitial   . Anxiety and depression   . Panic attack   . Schizophrenia     Past Surgical History  Procedure Laterality Date  . Tubal ligation     Family History:  Family History  Problem Relation Age of Onset  . Diabetes Father   . Hypertension Mother   . Hypertension Sister   . Lupus Sister   . Heart disease Maternal Grandfather   . Lupus     Social History:  History  Alcohol Use  . Yes    Comment: 6 pk beer     History  Drug Use No    History   Social History  . Marital Status: Single    Spouse Name: N/A  . Number of Children: N/A  . Years of Education: N/A   Social History Main Topics  . Smoking status: Current Every Day Smoker -- 0.25 packs/day for 4 years    Types: Cigarettes  . Smokeless tobacco: Not on file  . Alcohol Use: Yes     Comment: 6 pk beer  . Drug Use: No  . Sexual Activity: Yes    Birth Control/ Protection: Condom   Other Topics Concern   . None   Social History Narrative   Additional History:    Sleep: Fair, Improving  Appetite:  Fair, Improving   Assessment:   Musculoskeletal: Strength & Muscle Tone: within normal limits Gait & Station: normal Patient leans: N/A   Psychiatric Specialty Exam: Physical Exam  Constitutional: She is oriented to person, place, and time.  Neck: Normal range of motion.  Respiratory: Effort normal.  Musculoskeletal: Normal range of motion.  Neurological: She is alert and oriented to person, place, and time.  Skin: Skin is warm and dry.  Psychiatric: She is actively hallucinating. Thought content is paranoid. She exhibits a depressed mood.    Review of Systems  Psychiatric/Behavioral: Positive for depression and hallucinations. The patient is nervous/anxious.     Blood pressure 134/86, pulse 110, temperature 97.7 F (36.5 C), temperature source Oral, resp. rate 20, height '5\' 3"'  (1.6 m), weight 85.276 kg (188 lb), last menstrual period 01/04/2015.Body mass index is 33.31 kg/(m^2).  General Appearance: Casual and Fairly Groomed  Engineer, water::  Good  Speech:  Clear and Coherent and Normal Rate  Volume:  Normal  Mood:  Depressed  Affect:  Depressed  Thought Process:  Circumstantial and Goal Directed  Orientation:  Full (Time, Place, and Person)  Thought Content:  Hallucinations: Auditory Command:  Voices telling her to slit her wrist and Paranoid Ideation At this time patient states that she is not hearing voices.  Continues to be paranoid and in her room  Suicidal Thoughts:  Denies at this time  Homicidal Thoughts:  No  Memory:  Immediate;   Fair Recent;   Fair Remote;   Fair  Judgement:  Fair  Insight:  Present  Psychomotor Activity:  Normal  Concentration:  Fair  Recall:  [employer] of Knowledge:Good  Language: Good  Akathisia:  No  Handed:  Right  AIMS (if indicated):     Assets:  Communication Skills Desire for Improvement Housing Social Support  ADL's:   Intact  Cognition: WNL  Sleep:  Number of Hours: 6.5     Current Medications: Current Facility-Administered Medications  Medication Dose Route Frequency Provider Last Rate Last Dose  . acetaminophen (TYLENOL) tablet 650 mg  650 mg Oral Q6H PRN Harriet Butte, NP      . alum & mag hydroxide-simeth (MAALOX/MYLANTA) 200-200-20 MG/5ML suspension 30 mL  30 mL Oral Q4H PRN Harriet Butte, NP      . ARIPiprazole (ABILIFY) tablet 10 mg  10 mg Oral Daily Shuvon B Rankin, NP   10 mg at 01/31/15 0857  . benztropine (COGENTIN) tablet 0.5 mg  0.5 mg Oral QHS Harriet Butte, NP   0.5 mg at 01/30/15 2116  . busPIRone (BUSPAR) tablet 5 mg  5 mg Oral TID Harriet Butte, NP   5 mg at 01/31/15 0858  . FLUoxetine (PROZAC) capsule 40 mg  40 mg Oral Daily Shuvon B Rankin, NP   40 mg at 01/31/15 0857  . hydrOXYzine (ATARAX/VISTARIL) tablet 25 mg  25 mg Oral TID PRN Harriet Butte, NP   25 mg at 01/30/15 2002  . magnesium hydroxide (MILK OF MAGNESIA) suspension 30 mL  30 mL Oral Daily PRN Harriet Butte, NP      . nicotine (NICODERM CQ - dosed in mg/24 hours) patch 21 mg  21 mg Transdermal Daily Shuvon B Rankin, NP   21 mg at 01/31/15 0857  . traZODone (DESYREL) tablet 100 mg  100 mg Oral QHS PRN Harriet Butte, NP   100 mg at 01/30/15 2116    Lab Results:  Results for orders placed or performed during the hospital encounter of 01/29/15 (from the past 48 hour(s))  CBC WITH DIFFERENTIAL     Status: Abnormal   Collection Time: 01/29/15  6:21 PM  Result Value Ref Range   WBC 4.2 4.0 - 10.5 K/uL   RBC 5.43 (H) 3.87 - 5.11 MIL/uL   Hemoglobin 12.7 12.0 - 15.0 g/dL   HCT 40.6 36.0 - 46.0 %   MCV 74.8 (L) 78.0 - 100.0 fL   MCH 23.4 (L) 26.0 - 34.0 pg   MCHC 31.3 30.0 - 36.0 g/dL   RDW 15.0 11.5 - 15.5 %   Platelets 280 150 - 400 K/uL   Neutrophils Relative % 48 43 - 77 %   Lymphocytes Relative 44 12 - 46 %   Monocytes Relative 6 3 - 12 %   Eosinophils Relative 2 0 - 5 %   Basophils Relative 0 0 - 1 %    Neutro Abs 2.0 1.7 - 7.7 K/uL   Lymphs Abs 1.8 0.7 - 4.0 K/uL   Monocytes Absolute 0.3 0.1 - 1.0 K/uL   Eosinophils Absolute 0.1 0.0 - 0.7 K/uL   Basophils Absolute 0.0 0.0 -  0.1 K/uL   Smear Review MORPHOLOGY UNREMARKABLE   Comprehensive metabolic panel     Status: Abnormal   Collection Time: 01/29/15  6:21 PM  Result Value Ref Range   Sodium 141 135 - 145 mmol/L   Potassium 3.7 3.5 - 5.1 mmol/L   Chloride 108 96 - 112 mmol/L   CO2 24 19 - 32 mmol/L   Glucose, Bld 107 (H) 70 - 99 mg/dL   BUN 8 6 - 23 mg/dL   Creatinine, Ser 0.94 0.50 - 1.10 mg/dL   Calcium 9.3 8.4 - 10.5 mg/dL   Total Protein 7.3 6.0 - 8.3 g/dL   Albumin 4.2 3.5 - 5.2 g/dL   AST 25 0 - 37 U/L   ALT 20 0 - 35 U/L   Alkaline Phosphatase 70 39 - 117 U/L   Total Bilirubin 0.7 0.3 - 1.2 mg/dL   GFR calc non Af Amer 74 (L) >90 mL/min   GFR calc Af Amer 86 (L) >90 mL/min    Comment: (NOTE) The eGFR has been calculated using the CKD EPI equation. This calculation has not been validated in all clinical situations. eGFR's persistently <90 mL/min signify possible Chronic Kidney Disease.    Anion gap 9 5 - 15  Ethanol     Status: None   Collection Time: 01/29/15  6:21 PM  Result Value Ref Range   Alcohol, Ethyl (B) <5 0 - 9 mg/dL    Comment:        LOWEST DETECTABLE LIMIT FOR SERUM ALCOHOL IS 11 mg/dL FOR MEDICAL PURPOSES ONLY   Acetaminophen level     Status: Abnormal   Collection Time: 01/29/15  6:21 PM  Result Value Ref Range   Acetaminophen (Tylenol), Serum <10.0 (L) 10 - 30 ug/mL    Comment:        THERAPEUTIC CONCENTRATIONS VARY SIGNIFICANTLY. A RANGE OF 10-30 ug/mL MAY BE AN EFFECTIVE CONCENTRATION FOR MANY PATIENTS. HOWEVER, SOME ARE BEST TREATED AT CONCENTRATIONS OUTSIDE THIS RANGE. ACETAMINOPHEN CONCENTRATIONS >150 ug/mL AT 4 HOURS AFTER INGESTION AND >50 ug/mL AT 12 HOURS AFTER INGESTION ARE OFTEN ASSOCIATED WITH TOXIC REACTIONS.   Salicylate level     Status: None   Collection Time:  01/29/15  6:21 PM  Result Value Ref Range   Salicylate Lvl <9.4 2.8 - 20.0 mg/dL  Drug screen panel, emergency     Status: None   Collection Time: 01/29/15  8:06 PM  Result Value Ref Range   Opiates NONE DETECTED NONE DETECTED   Cocaine NONE DETECTED NONE DETECTED   Benzodiazepines NONE DETECTED NONE DETECTED   Amphetamines NONE DETECTED NONE DETECTED   Tetrahydrocannabinol NONE DETECTED NONE DETECTED   Barbiturates NONE DETECTED NONE DETECTED    Comment:        DRUG SCREEN FOR MEDICAL PURPOSES ONLY.  IF CONFIRMATION IS NEEDED FOR ANY PURPOSE, NOTIFY LAB WITHIN 5 DAYS.        LOWEST DETECTABLE LIMITS FOR URINE DRUG SCREEN Drug Class       Cutoff (ng/mL) Amphetamine      1000 Barbiturate      200 Benzodiazepine   709 Tricyclics       628 Opiates          300 Cocaine          300 THC              50     Physical Findings: AIMS: Facial and Oral Movements Muscles of Facial Expression: None, normal Lips and Perioral  Area: None, normal Jaw: None, normal Tongue: None, normal,Extremity Movements Upper (arms, wrists, hands, fingers): None, normal Lower (legs, knees, ankles, toes): None, normal, Trunk Movements Neck, shoulders, hips: None, normal, Overall Severity Severity of abnormal movements (highest score from questions above): None, normal Incapacitation due to abnormal movements: None, normal Patient's awareness of abnormal movements (rate only patient's report): No Awareness, Dental Status Current problems with teeth and/or dentures?: No Does patient usually wear dentures?: No  CIWA:  CIWA-Ar Total: 2 COWS:     Treatment Plan Summary: Daily contact with patient to assess and evaluate symptoms and progress in treatment and Medication management  1. Admit for crisis management and stabilization 2. Medication management to reduce current symptoms to bale line and improve the patient's overall level of functioning: Discontinued Zyprexa. Decreased Prozac to 40 mg daily;  Started Abilify 10 mg daily; Continued Cogentin 0.5 mg, Buspar 5 mg, Vistaril 25 mg PRN and Trazodone 100 mg 3. Treat health problems as indicated 4. Develop treatment plan to decrease risk of relapse upon discharge and the need for readmission. 5. Psycho-social education regarding relapse prevention and self care. 6. Health care follow up as needed for medical problems 7. Restart home medications where appropriate.   Will Continue with current treatment plan; no changes at this time   Medical Decision Making:  Established Problem, Stable/Improving (1), Review of Psycho-Social Stressors (1), Review of Last Therapy Session (1) and Review of Medication Regimen & Side Effects (2)   Rankin, Shuvon, FNP-BC 01/31/2015, 10:14 AM  Reviewed the information documented and agree with the treatment plan.  Jody [name],JANARDHAHA R. 01/31/2015 2:25 PM

## 2015-02-01 ENCOUNTER — Encounter (HOSPITAL_COMMUNITY): Payer: Self-pay | Admitting: Psychiatry

## 2015-02-01 DIAGNOSIS — F333 Major depressive disorder, recurrent, severe with psychotic symptoms: Principal | ICD-10-CM

## 2015-02-01 LAB — IRON AND TIBC
IRON: 68 ug/dL (ref 28–170)
Saturation Ratios: 20 % (ref 10.4–31.8)
TIBC: 342 ug/dL (ref 250–450)
UIBC: 274 ug/dL

## 2015-02-01 MED ORDER — VITAMIN B-12 1000 MCG PO TABS
500.0000 ug | ORAL_TABLET | Freq: Every day | ORAL | Status: DC
  Administered 2015-02-01 – 2015-02-02 (×2): 500 ug via ORAL
  Filled 2015-02-01 (×3): qty 1
  Filled 2015-02-01: qty 2

## 2015-02-01 MED ORDER — BENZTROPINE MESYLATE 0.5 MG PO TABS
0.5000 mg | ORAL_TABLET | Freq: Two times a day (BID) | ORAL | Status: DC
  Administered 2015-02-01 – 2015-02-02 (×2): 0.5 mg via ORAL
  Filled 2015-02-01: qty 8
  Filled 2015-02-01 (×2): qty 1
  Filled 2015-02-01: qty 8
  Filled 2015-02-01 (×2): qty 1

## 2015-02-01 MED ORDER — METFORMIN HCL 500 MG PO TABS
500.0000 mg | ORAL_TABLET | Freq: Every day | ORAL | Status: DC
  Administered 2015-02-01 – 2015-02-02 (×2): 500 mg via ORAL
  Filled 2015-02-01 (×5): qty 1

## 2015-02-01 MED ORDER — HALOPERIDOL 5 MG PO TABS
5.0000 mg | ORAL_TABLET | Freq: Two times a day (BID) | ORAL | Status: DC
  Administered 2015-02-01 – 2015-02-02 (×2): 5 mg via ORAL
  Filled 2015-02-01 (×2): qty 8
  Filled 2015-02-01 (×4): qty 1

## 2015-02-01 NOTE — Progress Notes (Signed)
Patient ID: [name], female   DOB: 05/21/1971, 44 y.o.   MRN: [id number]   D: Pt smiled upon approached and when informing the writer that she's to be discharged tomorrow. Pt continues to be med compliant stating that she feels she's on the right combination of medications. Stated, "You know I'm ready to go home". Pt voiced no questions or concerns.  A:  Support and encouragement was offered. 15 min checks continued for safety.  R: Pt remains safe.

## 2015-02-01 NOTE — Progress Notes (Addendum)
[facility] MD Progress Note  02/01/2015 2:57 PM Jody [name]  MRN:  [id number]    Subjective:  Patient states "I'm doing good. I gained weight on my Zyprexa, that is why I stopped taking it . I want to know if I will gain weight on the Abilify . If so I would like to be started on something else.'     Objective:  Patient seen and chart reviewed.  Discussed with treatment team.  Patient today denies any psychosis , reports depression as improving. Pt reports that she stopped taking her medications due to weight gain and that is how she ended up back in the hospital.  Patient denies SI/HI/AH/VH. States sleep as good. Discussed a trial of typical antipsychotics like Haldol inorder to help with her weight gain issues . Discussed with pt that antipsychotics in general can lead to weight gain, some more than others. Discussed options like diet control, exercise and so on.    Principal Problem: MDD (major depressive disorder), recurrent, severe, with psychosis Diagnosis:   Patient Active Problem List   Diagnosis Date Noted  . 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 Cystitis, interstitial   . Anxiety and depression   . Panic attack     Past Surgical History  Procedure Laterality Date  . Tubal ligation     Family History:  Family History  Problem Relation Age of Onset  . Diabetes Father   . Hypertension Mother   . Hypertension Sister   . Lupus Sister   . Heart disease Maternal Grandfather   . Lupus     Social History:  History  Alcohol Use  . Yes    Comment: 6 pk beer     History  Drug Use No    History   Social History  . Marital Status: Single    Spouse Name: N/A  . Number of Children: N/A  . Years of Education: N/A   Social History Main Topics  . Smoking status: Current Every Day Smoker -- 0.25 packs/day for 4  years    Types: Cigarettes  . Smokeless tobacco: Not on file  . Alcohol Use: Yes     Comment: 6 pk beer  . Drug Use: No  . Sexual Activity: Yes    Birth Control/ Protection: Condom   Other Topics Concern  . None   Social History Narrative   Additional History:    Sleep: Fair, Improving  Appetite:  Fair, Improving     Musculoskeletal: Strength & Muscle Tone: within normal limits Gait & Station: normal Patient leans: N/A   Psychiatric Specialty Exam: Physical Exam  Constitutional: She is oriented to person, place, and time.  Neck: Normal range of motion.  Respiratory: Effort normal.  Musculoskeletal: Normal range of motion.  Neurological: She is alert and oriented to person, place, and time.  Skin: Skin is warm and dry.    Review of Systems  Psychiatric/Behavioral: Positive for depression and hallucinations. The patient is nervous/anxious.     Blood pressure 127/86, pulse 102, temperature 98.3 F (36.8 C), temperature source Oral, resp. rate 18, height  (1.6 m), weight 85.276 kg (188 lb), last menstrual period 01/04/2015.Body mass index is 33.31 kg/(m^2).  General Appearance: Casual and Fairly Groomed  Eye Contact::  Good  Speech:  Clear and Coherent and Normal Rate  Volume:  Normal  Mood:  Depressed  Affect:  Depressed  Thought Process:  Circumstantial and Goal Directed  Orientation:  Full (Time, Place, and Person)  Thought Content:  Paranoid Ideation At this time patient states that she is not hearing voices.  Continues to be paranoid and in her room  Suicidal Thoughts:  Denies at this time  Homicidal Thoughts:  No  Memory:  Immediate;   Fair Recent;   Fair Remote;   Fair  Judgement:  Fair  Insight:  Present  Psychomotor Activity:  Normal  Concentration:  Fair  Recall:  [employer] of Knowledge:Good  Language: Good  Akathisia:  No  Handed:  Right  AIMS (if indicated):     Assets:  Communication Skills Desire for Improvement Housing Social  Support  ADL's:  Intact  Cognition: WNL  Sleep:  Number of Hours: 6.75     Current Medications: Current Facility-Administered Medications  Medication Dose Route Frequency Provider Last Rate Last Dose  . acetaminophen (TYLENOL) tablet 650 mg  650 mg Oral Q6H PRN Worthy Flank, NP   650 mg at 02/01/15 1135  . alum & mag hydroxide-simeth (MAALOX/MYLANTA) 200-200-20 MG/5ML suspension 30 mL  30 mL Oral Q4H PRN Worthy Flank, NP      . benztropine (COGENTIN) tablet 0.5 mg  0.5 mg Oral BID Jomarie Longs, MD      . busPIRone (BUSPAR) tablet 5 mg  5 mg Oral TID Worthy Flank, NP   5 mg at 02/01/15 1148  . cyanocobalamin tablet 500 mcg  500 mcg Oral Daily Bernardine Langworthy, MD      . FLUoxetine (PROZAC) capsule 40 mg  40 mg Oral Daily Shuvon B Rankin, NP   40 mg at 02/01/15 0803  . haloperidol (HALDOL) tablet 5 mg  5 mg Oral BID Jomarie Longs, MD      . hydrOXYzine (ATARAX/VISTARIL) tablet 25 mg  25 mg Oral TID PRN Worthy Flank, NP   25 mg at 01/30/15 2002  . magnesium hydroxide (MILK OF MAGNESIA) suspension 30 mL  30 mL Oral Daily PRN Worthy Flank, NP      . nicotine (NICODERM CQ - dosed in mg/24 hours) patch 21 mg  21 mg Transdermal Daily Shuvon B Rankin, NP   21 mg at 02/01/15 0804  . traZODone (DESYREL) tablet 100 mg  100 mg Oral QHS PRN Worthy Flank, NP   100 mg at 01/31/15 2119    Lab Results:  No results found for this or any previous visit (from the past 48 hour(s)).  Physical Findings: AIMS: Facial and Oral Movements Muscles of Facial Expression: None, normal Lips and Perioral Area: None, normal Jaw: None, normal Tongue: None, normal,Extremity Movements Upper (arms, wrists, hands, fingers): None, normal Lower (legs, knees, ankles, toes): None, normal, Trunk Movements Neck, shoulders, hips: None, normal, Overall Severity Severity of abnormal movements (highest score from questions above): None, normal Incapacitation due to abnormal movements: None, normal Patient's  awareness of abnormal movements (rate only patient's report): No Awareness, Dental Status Current problems with teeth and/or dentures?: No Does patient usually wear dentures?: No  CIWA:  CIWA-Ar Total: 2 COWS:      Assessment: Patient is a 16 y old AAF with hx of depression as well as psychosis, presented after she decompensated after she stopped taking her medications secondary to weight changes. Will readjust her medications. Continue treatment.   Treatment Plan Summary: Daily contact with patient to assess and evaluate symptoms and progress in treatment and  Medication management Will start Haldol 5 mg po bid for psychosis. Will continue Cogentin 0.5 mg po bid for EPS. Will continue Prozac 40 mg po daily for depression. Add Metformin 500 mg po daily to help with side effect of weight gain 2/2 antipsychotic medications. Will continue Trazodone 100 mg po qhs for sleep. CSW will work on disposition. Reviewed labs will order as needed.    Medical Decision Making:  Established Problem, Stable/Improving (1), Review of Psycho-Social Stressors (1), Review of Last Therapy Session (1), Review of Medication Regimen & Side Effects (2) and Review of New Medication or Change in Dosage (2)   Imagine Nest, md 02/01/2015, 2:57 PM

## 2015-02-01 NOTE — Progress Notes (Signed)
The focus of this group is to help patients review their daily goal of treatment and discuss progress on daily workbooks. Pt attended the evening group session and responded to all discussion prompts from the Writer. Pt shared that she was looking forward to possibly discharging tomorrow and that she hoped to stay well enough to not need a return visit. Pt's only additional request from Nursing Staff this evening was for towels, which were given to her following group. Pt was observed smiling in group and was pleasant in her interactions.

## 2015-02-01 NOTE — [facility] Group Notes (Signed)
[facility] Note   02/01/2015 8:49 AM  Participation Quality:  Engaged  Mood/Affect:  Flat  Depression Rating:  3  Anxiety Rating:  2  Thoughts of Suicide:  No Will you contract for safety?   NA  Current AVH:  No  Plan for Discharge/Comments:  "My depression is better.  I was gaining too much weight on my zyprexa, so I stopped taking it.  [facility].  I know that will help me."  Pt who was here last year is back, but presenting much better than last time.  Is goal directed, active in group and hopeful.  Transportation Means: family  Supports: family  [name], Jody DaubRodney B

## 2015-02-01 NOTE — Tx Team (Signed)
  Interdisciplinary Treatment Plan Update   Date Reviewed:  02/01/2015  Time Reviewed:  8:49 AM  Progress in Treatment:   Attending groups: Yes Participating in groups: Yes Taking medication as prescribed: Yes  Tolerating medication: Yes Family/Significant other contact made: Yes  Patient understands diagnosis: Yes AEB asking to get on medication for psychosis that does not cause weight gain Discussing patient identified problems/goals with staff: Yes  See initial care plan Medical problems stabilized or resolved: Yes Denies suicidal/homicidal ideation: Yes  In tx team Patient has not harmed self or others: Yes  For review of initial/current patient goals, please see plan of care.  Estimated Length of Stay:  Likely d/c tomorrow  Reason for Continuation of Hospitalization:   New Problems/Goals identified:  N/A  Discharge Plan or Barriers:   return home, follow up [facility]  Additional Comments:  Jody [name] is an 44 y.o. female [facility] due to reporting command hallucinations with a plan to slit her wrist. Pt stated "I was taking Olanzapine but I stopped talking it because it made me gain weight". "That's the pill that helps with the voices". "[facility] I just want to see the doctor so they could switch me out and change me to a different medication". Per IVC papers pt presented to therapy session with increased depression and suicidal thoughts. Pt reported that she was hearing voices that were telling her to cut herself. Pt denies SI at this time but reported that she had thoughts earlier today. "I felt hopeless this morning but I don't feel like that now". "I took my meds and I feel much better". Pt did not report any previous suicide attempts or psychiatric hospitalizations. Pt reported that [facility]. [facility] due to similar symptoms. Pt reported that she is sleeping  approximately 4-5 hours per night and did not report any issues with her appetite. Pt reported that she has gained over 50lbs and stated "the weight is hard to get off". Pt denies HI and AVH at this time. Pt stated "I was hearing voices earlier today.   Haldol, Prozac, Buspar trial  Attendees:  Signature: Ivin BootySarama Eappen, MD 02/01/2015 8:49 AM   Signature: Richelle Itood Gilberto Stanforth, LCSW 02/01/2015 8:49 AM  Signature: Fransisca KaufmannLaura Davis, NP 02/01/2015 8:49 AM  Signature: Joslyn Devonaroline Beaudry, RN 02/01/2015 8:49 AM  Signature:  02/01/2015 8:49 AM  Signature:  02/01/2015 8:49 AM  Signature:   02/01/2015 8:49 AM  Signature:    Signature:    Signature:    Signature:    Signature:    Signature:      Scribe for Treatment Team:   Richelle Itood Raul Winterhalter, LCSW  02/01/2015 8:49 AM

## 2015-02-01 NOTE — [facility] Group Notes (Signed)
[facility] LCSW Group Therapy  02/01/2015 1:15 pm  Type of Therapy: Process Group Therapy  Participation Level:  Active  Participation Quality:  Appropriate  Affect:  Flat  Cognitive:  Oriented  Insight:  Improving  Engagement in Group:  Limited  Engagement in Therapy:  Limited  Modes of Intervention:  Activity, Clarification, Education, Problem-solving and Support  Summary of Progress/Problems: Today's group addressed the issue of overcoming obstacles.  Patients were asked to identify their biggest obstacle post d/c that stands in the way of their on-going success, and then problem solve as to how to manage this.  Stated her biggest obstacles are stopping smoking and drinking beer.  However, she already has a plan to deal with that, as well as the medication issue.  "I joined the gym.  It's just down the road.  If I work out 3 times a week,  Not only will I lose weight, but I will feel better and it will ensure that I have a better chance of not drinking and smoking."  Pointed out that the ACT team has a substance abuse counselor as well, which she was excited about. Daryel Geraldorth, Jody [name] 02/01/2015   3:12 PM

## 2015-02-01 NOTE — Progress Notes (Signed)
   D: Pt stated, "I don't have the racing thoughts anymore." Pt wanted to assure writer that she wasn't asleep the "whole time" she was in the room. Reminded the writer that she doesn't enjoy being around lots of people. Pt smiled more today than previous day.  A:  Support and encouragement was offered. 15 min checks continued for safety.  R: Pt remains safe.

## 2015-02-01 NOTE — Progress Notes (Addendum)
D:Pt has been in her bedroom for majority of this shift. OOB for meals, groups and medication then back to bed. Pt presents with flat affect and depressed mood, but brightens up on approach. Pt rated her depression 0/10, hopelessness 0/10 and anxiety 1/10 when assessed this AM. Pt's goal for today is to "Continue taking her medications". R: All medications administered as per MD's orders including PRN Tylenol for c/o of headache. Verbal education done on medications at time of administration to improve insight and promote compliance. 1:1 contact made with pt for shift assessment and pain reassessment. Support and encouragement provided to pt. Safety maintained on Q 15 minutes checks as ordered. R: Pt denied HI, SI, AVH and pain at time of assessment. Remains compliant with ordered medications when offered and cooperative with unit routines. Safety maintained on and off unit. Continue POC.

## 2015-02-01 NOTE — Plan of Care (Signed)
Problem: Ineffective individual coping Goal: STG: Patient will remain free from self harm Outcome: Progressing Pt remains safe on Q 15 minutes checks as ordered without gestures or incident of self harm thus far this shift.

## 2015-02-02 LAB — FERRITIN: Ferritin: 31 ng/mL (ref 11–307)

## 2015-02-02 MED ORDER — FLUOXETINE HCL 40 MG PO CAPS: 40.0000 mg | ORAL_CAPSULE | Freq: Every day | ORAL | Status: DC

## 2015-02-02 MED ORDER — BENZTROPINE MESYLATE 0.5 MG PO TABS: 0.5000 mg | ORAL_TABLET | Freq: Two times a day (BID) | ORAL | Status: DC

## 2015-02-02 MED ORDER — HYDROXYZINE HCL 25 MG PO TABS: 25.0000 mg | ORAL_TABLET | Freq: Three times a day (TID) | ORAL | Status: DC | PRN

## 2015-02-02 MED ORDER — NICOTINE 21 MG/24HR TD PT24: 21.0000 mg | MEDICATED_PATCH | Freq: Every day | TRANSDERMAL | Status: DC

## 2015-02-02 MED ORDER — ADULT MULTIVITAMIN W/MINERALS CH: 1.0000 | ORAL_TABLET | Freq: Every day | ORAL | Status: DC

## 2015-02-02 MED ORDER — CYANOCOBALAMIN 500 MCG PO TABS: 500.0000 ug | ORAL_TABLET | Freq: Every day | ORAL | Status: DC

## 2015-02-02 MED ORDER — METFORMIN HCL 500 MG PO TABS
500.0000 mg | ORAL_TABLET | Freq: Every day | ORAL | Status: DC
  Filled 2015-02-02: qty 1
  Filled 2015-02-02: qty 3

## 2015-02-02 MED ORDER — METFORMIN HCL 500 MG PO TABS: 500.0000 mg | ORAL_TABLET | Freq: Every day | ORAL | Status: DC

## 2015-02-02 MED ORDER — BUSPIRONE HCL 5 MG PO TABS: 5.0000 mg | ORAL_TABLET | Freq: Three times a day (TID) | ORAL | Status: DC

## 2015-02-02 MED ORDER — TRAZODONE HCL 100 MG PO TABS
100.0000 mg | ORAL_TABLET | Freq: Every evening | ORAL | Status: DC | PRN
Start: 2015-02-02 — End: 2019-11-06

## 2015-02-02 MED ORDER — HALOPERIDOL 5 MG PO TABS: 5.0000 mg | ORAL_TABLET | Freq: Two times a day (BID) | ORAL | Status: DC

## 2015-02-02 NOTE — Progress Notes (Signed)
  [facility] :  Will you be returning to the same living situation after discharge:  Yes,  home At discharge, do you have transportation home?: Yes,  mother Do you have the ability to pay for your medications: Yes,  MCD  Release of information consent forms completed and in the chart;  Patient's signature needed at discharge.  Patient to Follow up at: Follow-up Information    Follow up with [facility] On 02/05/2015.   Why:  Friday at 9AM with Lauree ChandlerBill Garrett   Contact information:   [address]  [place]  [336] [phone]      Patient denies SI/HI: Yes,  yes    Safety Planning and Suicide Prevention discussed: Yes,  yes  Have you used any form of tobacco in the last 30 days? (Cigarettes, Smokeless Tobacco, Cigars, and/or Pipes): Yes  Has patient been referred to the Quitline?: Yes, faxed on 01/30/15  [name], Alliene Klugh B 02/02/2015, 10:17 AM

## 2015-02-02 NOTE — Progress Notes (Signed)
D: [name] is calm and cooperative. She has been in her room this a.m reading a Bible. She denies SI/HI/AVH/pain. She verbalizes no needs, but says she is looking forward to discharge today. A: Medications given without issue. Q15 safety checks maintained. Support/encouragement offered. R: Pt proceeds with treatment and remains safe. Will continue to monitor for needs/safety.

## 2015-02-02 NOTE — Progress Notes (Signed)
[name] was escorted to lobby, where her mom was waiting. Pt was given AVS, scripts, and medications samples. These items were reviewed, as was her follow-up appt. Pt voiced understanding. Pt received and signed for belongings. She verbalized feelings of safety and adequacy for discharge.

## 2015-02-02 NOTE — [facility] Suicide Risk Assessment (Signed)
[facility]   Demographic Factors:  Low socioeconomic status and Unemployed  Total Time spent with patient: 30 minutes  Musculoskeletal: Strength & Muscle Tone: within normal limits Gait & Station: normal Patient leans: N/A  Psychiatric Specialty Exam: Physical Exam  ROS  Blood pressure 127/86, pulse 102, temperature 98.3 F (36.8 C), temperature source Oral, resp. rate 18, height 5\' 3"  (1.6 m), weight 85.276 kg (188 lb), last menstrual period 01/04/2015.Body mass index is 33.31 kg/(m^2).  General Appearance: Casual  Eye Contact::  Good  Speech:  Clear and Coherent409  Volume:  Normal  Mood:  Euthymic  Affect:  Appropriate  Thought Process:  Coherent  Orientation:  Full (Time, Place, and Person)  Thought Content:  WDL  Suicidal Thoughts:  No  Homicidal Thoughts:  No  Memory:  Immediate;   Fair Recent;   Fair Remote;   Fair  Judgement:  Fair  Insight:  Fair  Psychomotor Activity:  Normal  Concentration:  Fair  Recall:  FiservFair  Fund of Knowledge:Fair  Language: Fair  Akathisia:  No  Handed:  Right  AIMS (if indicated):     Assets:  Communication Skills Desire for Improvement  Sleep:  Number of Hours: 6.75  Cognition: WNL  ADL's:  Intact   Have you used any form of tobacco in the last 30 days? (Cigarettes, Smokeless Tobacco, Cigars, and/or Pipes): Yes  Has this patient used any form of tobacco in the last 30 days? (Cigarettes, Smokeless Tobacco, Cigars, and/or Pipes) Yes, A prescription for nicotine patch was provided for tobacco cessation per pt request.  Mental Status Per Nursing Assessment::   On Admission:  NA  Current Mental Status by Physician: patient denies SI/HI/AH/VH  Loss Factors: Decrease in vocational status and Financial problems/change in socioeconomic status  Historical Factors: Impulsivity  Risk Reduction Factors:   Positive social support  Continued Clinical Symptoms:  Previous Psychiatric Diagnoses and  Treatments  Cognitive Features That Contribute To Risk:  Polarized thinking    Suicide Risk:  Minimal: No identifiable suicidal ideation.  Patients presenting with no risk factors but with morbid ruminations; may be classified as minimal risk based on the severity of the depressive symptoms  Principal Problem: MDD (major depressive disorder), recurrent, severe, with psychosis Discharge Diagnoses:  Patient Active Problem List   Diagnosis Date Noted  . MDD (major depressive disorder), recurrent, severe, with psychosis [F33.3] 02/01/2015    Follow-up Information    Follow up with [facility] On 02/05/2015.   Why:  Friday at 9AM with Lauree ChandlerBill Garrett      Plan Of Care/Follow-up recommendations:  Activity:  No restrictions Diet:  regular Tests:  as needed Other:  follow up with after care as scheduled  Is patient on multiple antipsychotic therapies at discharge:  No   Has Patient had three or more failed trials of antipsychotic monotherapy by history:  No  Recommended Plan for Multiple Antipsychotic Therapies: NA    Kenasia Scheller md 02/02/2015, 9:25 AM

## 2015-02-02 NOTE — [facility] Suicide Risk Assessment (Signed)
[facility] INPATIENT:  Family/Significant Other Suicide Prevention Education  Suicide Prevention Education:  Education Completed; Mable FillMarcie Kirkpatrick, mother, [phone]  has been identified by the patient as the family member/significant other with whom the patient will be residing, and identified as the person(s) who will aid the patient in the event of a mental health crisis (suicidal ideations/suicide attempt).  With written consent from the patient, the family member/significant other has been provided the following suicide prevention education, prior to the and/or following the discharge of the patient.  The suicide prevention education provided includes the following:  Suicide risk factors  Suicide prevention and interventions  National Suicide Hotline telephone number  [facility] assessment telephone number  [facility] Emergency Assistance 911  [facility] and/or [facility]idential [facility] telephone number  Request made of family/significant other to:  Remove weapons (e.g., guns, rifles, knives), all items previously/currently identified as safety concern.    Remove drugs/medications (over-the-counter, prescriptions, illicit drugs), all items previously/currently identified as a safety concern.  The family member/significant other verbalizes understanding of the suicide prevention education information provided.  The family member/significant other agrees to remove the items of safety concern listed above.  Daryel Geraldorth, Angele Wiemann B 02/02/2015, 10:28 AM

## 2015-02-02 NOTE — Discharge Summary (Signed)
Physician Discharge Summary Note  Patient:  Jody [name] is an 44 y.o., female MRN:  [id number] DOB:  [birth year] Patient phone:  [phone] (home)  Patient address:   [address] [place] [address],  Total Time spent with patient: 30 minutes  Date of Admission:  01/29/2015 Date of Discharge: 02/02/15  Reason for Admission:  Mood stabilization treatments  Principal Problem: MDD (major depressive disorder), recurrent, severe, with psychosis Discharge Diagnoses: Patient Active Problem List   Diagnosis Date Noted  . MDD (major depressive disorder), recurrent, severe, with psychosis [F33.3] 02/01/2015    Musculoskeletal: Strength & Muscle Tone: within normal limits Gait & Station: normal Patient leans: N/A  Psychiatric Specialty Exam: Physical Exam  Psychiatric: She has a normal mood and affect. Her speech is normal and behavior is normal. Judgment and thought content normal. Cognition and memory are normal.    Review of Systems  Constitutional: Negative.   HENT: Negative.   Eyes: Negative.   Respiratory: Negative.   Cardiovascular: Negative.   Gastrointestinal: Negative.   Genitourinary: Negative.   Musculoskeletal: Negative.   Skin: Negative.   Neurological: Negative.   Endo/Heme/Allergies: Negative.   Psychiatric/Behavioral: Positive for depression (Stabilized with treatment). Negative for suicidal ideas, hallucinations, memory loss and substance abuse. The patient is not nervous/anxious and does not have insomnia.     Blood pressure 127/86, pulse 102, temperature 98.3 F (36.8 C), temperature source Oral, resp. rate 18, height  (1.6 m), weight 85.276 kg (188 lb), last menstrual period 01/04/2015.Body mass index is 33.31 kg/(m^2).  See Physician SRA     Past Medical History:  Past Medical History  Diagnosis Date  . Cystitis, interstitial   . Anxiety and depression   . Panic attack     Past Surgical History  Procedure Laterality Date  . Tubal  ligation     Family History:  Family History  Problem Relation Age of Onset  . Diabetes Father   . Hypertension Mother   . Hypertension Sister   . Lupus Sister   . Heart disease Maternal Grandfather   . Lupus     Social History:  History  Alcohol Use  . Yes    Comment: 6 pk beer     History  Drug Use No    History   Social History  . Marital Status: Single    Spouse Name: N/A  . Number of Children: N/A  . Years of Education: N/A   Social History Main Topics  . Smoking status: Current Every Day Smoker -- 0.25 packs/day for 4 years    Types: Cigarettes  . Smokeless tobacco: Not on file  . Alcohol Use: Yes     Comment: 6 pk beer  . Drug Use: No  . Sexual Activity: Yes    Birth Control/ Protection: Condom   Other Topics Concern  . None   Social History Narrative    Risk to Self:   Risk to Others:   Prior Inpatient Therapy:   Prior Outpatient Therapy:    Level of Care:  OP  Hospital Course:   Jody [name] is an 44 y.o. female referred to [facility] by her psychiatrist due to reporting command hallucinations with a plan to slit her wrist. Pt stated "I was taking Olanzapine but I stopped talking it because it made me gain weight". "That's the pill that helps with the voices". "I'm not going to [facility] I just want to see the doctor so they could switch me  out and change me to a different medication". Per IVC papers pt presented to therapy session with increased depression and suicidal thoughts. Pt reported that she was hearing voices that were telling her to cut herself. Pt denies SI at this time but reported that she had thoughts earlier today. "I felt hopeless this morning but I don't feel like that now". "I took my meds and I feel much better". Pt did not report any previous suicide attempts or psychiatric hospitalizations. Pt reported that she is currently receiving mental health treatment at [facility]. Pt was hospitalized at [facility] due to similar symptoms. Pt reported that  she is sleeping approximately 4-5 hours per night and did not report any issues with her appetite. Pt reported that she has gained over 50lbs and stated "the weight is hard to get off". Pt denies HI and AVH at this time. Pt stated "I was hearing voices earlier today. Pt did not report any illicit substance abuse but shared that she drinks every other day. Pt did not report a history of physical, sexual or emotional abuse at this time.         Jody [name] was admitted to the adult unit where she was evaluated and her symptoms were identified. Medication management was discussed and implemented. Patient was started on Haldol 5 mg twice daily for psychotic symptoms. She was ordered Cogentin 0.5 mg twice daily for prevention of side effects.  She was encouraged to participate in unit programming. Medical problems were identified and treated appropriately. The patient was ordered Metformin 500 mg daily for antipsychotic induced weight gain. She reported gaining a great deal of weight on Zyprexa. Patient was continued on Buspar for anxiety and Prozac for depression. Home medication was restarted as needed.  She was evaluated each day by a clinical provider to ascertain the patient's response to treatment.  Improvement was noted by the patient's report of decreasing symptoms, improved sleep and appetite, affect, medication tolerance, behavior, and participation in unit programming.  The patient was asked each day to complete a self inventory noting mood, mental status, pain, new symptoms, anxiety and concerns.         She responded well to medication and being in a therapeutic and supportive environment. Positive and appropriate behavior was noted and the patient was motivated for recovery.  She worked closely with the treatment team and case manager to develop a discharge plan with appropriate goals. Coping skills, problem solving as well as relaxation therapies were also part of the unit programming.         By  the day of discharge she was in much improved condition than upon admission.  Symptoms were reported as significantly decreased or resolved completely. The patient denied SI/HI and voiced no AVH. She was motivated to continue taking medication with a goal of continued improvement in mental health.  Jody [name] was discharged home with a plan to follow up as noted below. The patient was provided with sample medications and prescriptions at time of discharge. She left [facility] in stable condition with all belongings returned to her. The patient was provided with a Nicotine patch on discharge to address her use of cigarettes and to encourage cessation.   Consults:  None  Significant Diagnostic Studies:  Iron profile, CBC, UDS negative, Chemistry panel   Discharge Vitals:   Blood pressure 127/86, pulse 102, temperature 98.3 F (36.8 C), temperature source Oral, resp. rate 18, height  (1.6 m), weight 85.276 kg (188 lb),  last menstrual period 01/04/2015. Body mass index is 33.31 kg/(m^2). Lab Results:   Results for orders placed or performed during the hospital encounter of 01/29/15 (from the past 72 hour(s))  Iron and TIBC     Status: None   Collection Time: 02/01/15  7:49 PM  Result Value Ref Range   Iron 68 28 - 170 ug/dL   TIBC 161342 096250 - 045450 ug/dL   Saturation Ratios 20 10.4 - 31.8 %   UIBC 274 ug/dL    Comment: [facility]formed [facility]  Ferritin     Status: None   Collection Time: 02/01/15  7:49 PM  Result Value Ref Range   Ferritin 31 11 - 307 ng/mL    Comment: [facility]    Physical Findings: AIMS: Facial and Oral Movements Muscles of Facial Expression: None, normal Lips and Perioral Area: None, normal Jaw: None, normal Tongue: None, normal,Extremity Movements Upper (arms, wrists, hands, fingers): None, normal Lower (legs, knees, ankles, toes): None, normal, Trunk Movements Neck, shoulders, hips: None, normal, Overall Severity Severity of  abnormal movements (highest score from questions above): None, normal Incapacitation due to abnormal movements: None, normal Patient's awareness of abnormal movements (rate only patient's report): No Awareness, Dental Status Current problems with teeth and/or dentures?: No Does patient usually wear dentures?: No  CIWA:  CIWA-Ar Total: 2 COWS:      See Psychiatric Specialty Exam and Suicide Risk Assessment completed by Attending Physician prior to discharge.  Discharge destination:  Home  Is patient on multiple antipsychotic therapies at discharge:  No   Has Patient had three or more failed trials of antipsychotic monotherapy by history:  No  Recommended Plan for Multiple Antipsychotic Therapies: NA  Discharge Instructions    Discharge instructions    Complete by:  As directed   Please see your Primary Care Provider for follow up of any chronic medical problems and for further treatment of low Vitamin-B 12 levels as scheduled.            Medication List    STOP taking these medications        carbamide peroxide 6.5 % otic solution  Commonly known as:  DEBROX     cephALEXin 500 MG capsule  Commonly known as:  KEFLEX     hydrocortisone 2.5 % cream     olanzapine zydis 15 MG disintegrating tablet  Commonly known as:  ZYPREXA     OLANZapine zydis 5 MG disintegrating tablet  Commonly known as:  ZYPREXA     phenazopyridine 200 MG tablet  Commonly known as:  PYRIDIUM     Vitamin D (Ergocalciferol) 50000 UNITS Caps capsule  Commonly known as:  DRISDOL     zolpidem 5 MG tablet  Commonly known as:  AMBIEN      TAKE these medications      Indication   benztropine 0.5 MG tablet  Commonly known as:  COGENTIN  Take 1 tablet (0.5 mg total) by mouth 2 (two) times daily.   Indication:  Extrapyramidal Reaction caused by Medications     busPIRone 5 MG tablet  Commonly known as:  BUSPAR  Take 1 tablet (5 mg total) by mouth 3 (three) times daily.   Indication:  Anxiety  Disorder     cyanocobalamin 500 MCG tablet  Take 1 tablet (500 mcg total) by mouth daily.   Indication:  Inadequate Vitamin B12     FLUoxetine 40 MG capsule  Commonly known as:  PROZAC  Take  1 capsule (40 mg total) by mouth daily.   Indication:  Depression, Major Depressive Disorder     haloperidol 5 MG tablet  Commonly known as:  HALDOL  Take 1 tablet (5 mg total) by mouth 2 (two) times daily.   Indication:  Psychosis, Schizophrenia     hydrOXYzine 25 MG tablet  Commonly known as:  ATARAX/VISTARIL  Take 1 tablet (25 mg total) by mouth 3 (three) times daily as needed for anxiety.   Indication:  Anxiety Neurosis     metFORMIN 500 MG tablet  Commonly known as:  GLUCOPHAGE  Take 1 tablet (500 mg total) by mouth daily with breakfast.  Start taking on:  02/03/2015   Indication:  Antipsychotic Therapy-Induced Weight Gain     multivitamin with minerals Tabs tablet  Take 1 tablet by mouth daily.   Indication:  Vitamin Supplementation     nicotine 21 mg/24hr patch  Commonly known as:  NICODERM CQ - dosed in mg/24 hours  Place 1 patch (21 mg total) onto the skin daily.   Indication:  Nicotine Addiction     traZODone 100 MG tablet  Commonly known as:  DESYREL  Take 1 tablet (100 mg total) by mouth at bedtime as needed for sleep.   Indication:  Trouble Sleeping           Follow-up Information    Follow up with [facility] On 02/05/2015.   Why:  Friday at 9AM with Lauree Chandler   Contact information:   [address]  [place]  [336] [phone]      Follow-up recommendations:   Activity: No restrictions Diet: regular Tests: as needed Other: follow up with after care as scheduled  Comments:   Take all your medications as prescribed by your mental healthcare provider.  Report any adverse effects and or reactions from your medicines to your outpatient provider promptly.  Patient is instructed and cautioned to not engage in alcohol and or illegal drug use while on prescription  medicines.  In the event of worsening symptoms, patient is instructed to call the crisis hotline, 911 and or go to the nearest ED for appropriate evaluation and treatment of symptoms.  Follow-up with your primary care provider for your other medical issues, concerns and or health care needs.   Total Discharge Time: Greater than 30 minutes  Signed: Roxanne Panek NP-C 02/02/2015, 11:39 AM

## 2015-02-02 NOTE — [facility] Group Notes (Signed)
[facility] Group Notes:  (Nursing/MHT/Case Management/Adjunct)  Date:  02/02/2015  Time:  9:55 AM  Type of Therapy:  Nurse Education  Participation Level:  Minimal  Participation Quality:  Appropriate and Attentive  Affect:  Appropriate  Cognitive:  Alert, Appropriate and Oriented  Insight:  Appropriate  Engagement in Group:  Engaged  Modes of Intervention:  Discussion and Education  Summary of Progress/Problems: Jody SakaiRosalind was attentive during group and spoke when called upon. She is looking forward for discharge today and plans to follow up with [facility].  Jody [name], Jody [name] M 02/02/2015, 9:55 AM

## 2015-02-11 ENCOUNTER — Encounter (HOSPITAL_COMMUNITY): Payer: Self-pay | Admitting: Emergency Medicine

## 2015-02-11 ENCOUNTER — Emergency Department (HOSPITAL_COMMUNITY)
Admission: EM | Admit: 2015-02-11 | Discharge: 2015-02-11 | Disposition: A | Discharge status: Home / Self Care | Payer: Medicaid Other | Attending: Emergency Medicine | Admitting: Emergency Medicine

## 2015-02-11 ENCOUNTER — Emergency Department (HOSPITAL_COMMUNITY): Payer: Medicaid Other

## 2015-02-11 DIAGNOSIS — Z3202 Encounter for pregnancy test, result negative: Secondary | ICD-10-CM | POA: Diagnosis not present

## 2015-02-11 DIAGNOSIS — Z880 Allergy status to penicillin: Secondary | ICD-10-CM | POA: Insufficient documentation

## 2015-02-11 DIAGNOSIS — Z9851 Tubal ligation status: Secondary | ICD-10-CM | POA: Insufficient documentation

## 2015-02-11 DIAGNOSIS — Z79899 Other long term (current) drug therapy: Secondary | ICD-10-CM | POA: Diagnosis not present

## 2015-02-11 DIAGNOSIS — F410 Panic disorder [episodic paroxysmal anxiety] without agoraphobia: Secondary | ICD-10-CM | POA: Insufficient documentation

## 2015-02-11 DIAGNOSIS — N3000 Acute cystitis without hematuria: Secondary | ICD-10-CM | POA: Diagnosis not present

## 2015-02-11 DIAGNOSIS — Z720 Tobacco use: Secondary | ICD-10-CM | POA: Insufficient documentation

## 2015-02-11 DIAGNOSIS — F329 Major depressive disorder, single episode, unspecified: Secondary | ICD-10-CM | POA: Insufficient documentation

## 2015-02-11 DIAGNOSIS — R05 Cough: Secondary | ICD-10-CM | POA: Diagnosis not present

## 2015-02-11 DIAGNOSIS — R300 Dysuria: Secondary | ICD-10-CM | POA: Diagnosis present

## 2015-02-11 LAB — URINE MICROSCOPIC-ADD ON

## 2015-02-11 LAB — POC URINE PREG, ED: PREG TEST UR: NEGATIVE

## 2015-02-11 LAB — URINALYSIS, ROUTINE W REFLEX MICROSCOPIC
BILIRUBIN URINE: NEGATIVE
Glucose, UA: NEGATIVE mg/dL
Hgb urine dipstick: NEGATIVE
KETONES UR: NEGATIVE mg/dL
NITRITE: NEGATIVE
PH: 5.5 (ref 5.0–8.0)
Protein, ur: NEGATIVE mg/dL
Specific Gravity, Urine: 1.005 (ref 1.005–1.030)
UROBILINOGEN UA: 0.2 mg/dL (ref 0.0–1.0)

## 2015-02-11 MED ORDER — FLUCONAZOLE 200 MG PO TABS
200.0000 mg | ORAL_TABLET | Freq: Every day | ORAL | Status: AC
Start: 2015-02-11 — End: 2015-02-18

## 2015-02-11 MED ORDER — NITROFURANTOIN MONOHYD MACRO 100 MG PO CAPS: 100.0000 mg | ORAL_CAPSULE | Freq: Two times a day (BID) | ORAL | Status: DC

## 2015-02-11 NOTE — Discharge Instructions (Signed)

## 2015-02-11 NOTE — ED Provider Notes (Signed)
CSN: [id number]     Arrival date & time 02/11/15  0815 History   First MD Initiated Contact with Patient 02/11/15 0827     Chief Complaint  Patient presents with  . Urinary Tract Infection   Jody [name] is a 44 y.o. female is a 44 year old female past medical history significant for anxiety, depression, interstitial cystitis and hallucinations who presents with productive cough in the mornings, and dysuria. The patient reports that she has been suffering with dysuria at the end of urination for the last month. The patient denies any fevers, chills, chest pain, shortness of breath, nausea, vomiting, constipation, diarrhea. The patient denies any vaginal discharge or vaginal bleeding. The patient also reports that every morning when she wakes up, she has a yellow productive cough. The patient is concerned because she feels this productive cough is related to oral sex with her boyfriend. The patient reports that she has stopped that recently. The patient denies any oral ulcers, or bleeding, or any pain or difficulty swallowing. The patient has been tolerating by mouth. The patient has no other complaints on arrival physically denies any SI, HI, or hallucinations.   (Consider location/radiation/quality/duration/timing/severity/associated sxs/prior Treatment) Patient is a 44 y.o. female presenting with dysuria and cough. The history is provided by the patient. No language interpreter was used.  Dysuria Pain quality:  Burning Pain severity:  Mild Onset quality:  Gradual Duration:  1 month Timing:  Intermittent Progression:  Waxing and waning Chronicity:  Recurrent Recent urinary tract infections: yes (4 months ago)   Relieved by:  Nothing Worsened by:  Nothing tried Ineffective treatments:  None tried Urinary symptoms: discolored urine   Urinary symptoms: no foul-smelling urine, no frequent urination, no hematuria, no hesitancy and no bladder incontinence   Associated symptoms: no abdominal  pain, no fever, no flank pain, no genital lesions, no nausea, no vaginal discharge and no vomiting   Risk factors: recurrent urinary tract infections   Cough Cough characteristics:  Productive Sputum characteristics:  Yellow Severity:  Mild Onset quality:  Gradual Duration: 8 months. Timing:  Intermittent Progression:  Waxing and waning Chronicity:  Chronic Relieved by:  Nothing Worsened by:  Nothing tried Ineffective treatments:  None tried Associated symptoms: no chest pain, no chills, no fever, no headaches, no myalgias, no rash, no rhinorrhea, no shortness of breath, no sore throat and no wheezing     Past Medical History  Diagnosis Date  . Cystitis, interstitial   . Anxiety and depression   . Panic attack    Past Surgical History  Procedure Laterality Date  . Tubal ligation     Family History  Problem Relation Age of Onset  . Diabetes Father   . Hypertension Mother   . Hypertension Sister   . Lupus Sister   . Heart disease Maternal Grandfather   . Lupus     History  Substance Use Topics  . Smoking status: Current Every Day Smoker -- 0.25 packs/day for 4 years    Types: Cigarettes  . Smokeless tobacco: Not on file  . Alcohol Use: Yes     Comment: 6 pk beer   OB History    No data available     Review of Systems  Unable to perform ROS Constitutional: Negative for fever, chills, appetite change and fatigue.  HENT: Negative for congestion, mouth sores, rhinorrhea and sore throat.   Eyes: Negative for visual disturbance.  Respiratory: Positive for cough. Negative for choking, chest tightness, shortness of breath, wheezing and stridor.  Cardiovascular: Negative for chest pain and palpitations.  Gastrointestinal: Negative for nausea, vomiting, abdominal pain, diarrhea and constipation.  Genitourinary: Positive for dysuria. Negative for flank pain, vaginal bleeding, vaginal discharge and vaginal pain.  Musculoskeletal: Negative for myalgias, back pain and neck  stiffness.  Skin: Negative for rash and wound.  Neurological: Negative for headaches.  Psychiatric/Behavioral: Negative for agitation.  All other systems reviewed and are negative.     Allergies  Penicillins  Home Medications   Prior to Admission medications   Medication Sig Start Date End Date Taking? Authorizing Provider  benztropine (COGENTIN) 0.5 MG tablet Take 1 tablet (0.5 mg total) by mouth 2 (two) times daily. 02/02/15   Thermon Leyland, NP  busPIRone (BUSPAR) 5 MG tablet Take 1 tablet (5 mg total) by mouth 3 (three) times daily. 02/02/15   Thermon Leyland, NP  cyanocobalamin 500 MCG tablet Take 1 tablet (500 mcg total) by mouth daily. 02/02/15   Thermon Leyland, NP  FLUoxetine (PROZAC) 40 MG capsule Take 1 capsule (40 mg total) by mouth daily. 02/02/15   Thermon Leyland, NP  haloperidol (HALDOL) 5 MG tablet Take 1 tablet (5 mg total) by mouth 2 (two) times daily. 02/02/15   Thermon Leyland, NP  hydrOXYzine (ATARAX/VISTARIL) 25 MG tablet Take 1 tablet (25 mg total) by mouth 3 (three) times daily as needed for anxiety. 02/02/15   Thermon Leyland, NP  metFORMIN (GLUCOPHAGE) 500 MG tablet Take 1 tablet (500 mg total) by mouth daily with breakfast. 02/03/15   Thermon Leyland, NP  Multiple Vitamin (MULTIVITAMIN WITH MINERALS) TABS tablet Take 1 tablet by mouth daily. 02/02/15   Thermon Leyland, NP  nicotine (NICODERM CQ - DOSED IN MG/24 HOURS) 21 mg/24hr patch Place 1 patch (21 mg total) onto the skin daily. 02/02/15   Thermon Leyland, NP  traZODone (DESYREL) 100 MG tablet Take 1 tablet (100 mg total) by mouth at bedtime as needed for sleep. 02/02/15   Thermon Leyland, NP   BP 120/75 mmHg  Pulse 77  Temp(Src) 98 F (36.7 C) (Oral)  Resp 18  Ht  (1.626 m)  Wt 185 lb (83.915 kg)  BMI 31.74 kg/m2  SpO2 97%  LMP 01/04/2015 (Exact Date) Physical Exam  Constitutional: She is oriented to person, place, and time. She appears well-developed and well-nourished. No distress.  HENT:  Head: Normocephalic.  Mouth/Throat:  No oropharyngeal exudate.  Eyes: Pupils are equal, round, and reactive to light.  Neck: Normal range of motion.  Cardiovascular: Normal rate, regular rhythm and normal heart sounds.   No murmur heard. Pulmonary/Chest: Effort normal and breath sounds normal. No stridor. No respiratory distress. She exhibits no tenderness.  Abdominal: Bowel sounds are normal. She exhibits no distension. There is no tenderness. There is no rebound.  Musculoskeletal: She exhibits no tenderness.  Neurological: She is alert and oriented to person, place, and time. She exhibits normal muscle tone.  Skin: Skin is warm. She is not diaphoretic. No erythema. No pallor.  Psychiatric: She has a normal mood and affect.  Nursing note and vitals reviewed.   ED Course  Procedures (including critical care time) Labs Review Labs Reviewed  URINALYSIS, ROUTINE W REFLEX MICROSCOPIC - Abnormal; Notable for the following:    Color, Urine STRAW (*)    APPearance CLOUDY (*)    Leukocytes, UA LARGE (*)    All other components within normal limits  URINE MICROSCOPIC-ADD ON - Abnormal; Notable for the following:  Squamous Epithelial / LPF MANY (*)    Bacteria, UA FEW (*)    All other components within normal limits  URINE CULTURE  POC URINE PREG, ED  POC URINE PREG, ED    Imaging Review Dg Chest 2 View  02/11/2015   CLINICAL DATA:  UTI.  EXAM: CHEST  2 VIEW  COMPARISON:  None.  FINDINGS: Mediastinum and hilar structures normal. Mild lingular subsegmental atelectasis and/or infiltrate cannot be excluded. Heart size normal. No pleural effusion or pneumothorax. No acute bony abnormality.  IMPRESSION: Mild lingular subsegmental atelectasis and or infiltrate cannot be excluded.   Electronically Signed   By: Maisie Fushomas  Register   On: 02/11/2015 10:08     EKG Interpretation None      MDM   Final diagnoses:  Acute cystitis without hematuria   Jody [name] is a 44 y.o. female is a 44 year old female past medical history  significant for anxiety, depression, interstitial cystitis and hallucinations who presents with productive cough in the mornings, and dysuria. On exam, the patient had lungs were clear to auscultation bilaterally abdomen was soft and nontender. Given her productive cough for several months, a chest x-ray was ordered and did not show acute pneumothorax. There was some mild atelectasis and they could not exclude an infiltrate however, with the lack of fevers or chills or shortness of breath, pneumonia was felt unlikely. With the patient's dysuria, the patient had a urinalysis which revealed leukocytes, cloudy appearance, and bacteria. It was felt that the patient had a urinary tract infection. The patient reports that when she takes antibiotics, she develops yeast infection and she was given a prescription for prophylactic Diflucan. The patient had an allergy to penicillins and was given a prescription for Macrobid to treat her urinary tract infection.  The patient voiced understanding of the plan of care and was given return precautions for new or worsening symptoms of her cough or urinary tract infection. The patient was instructed to follow-up with her PCP in the next several days. The patient had no other questions, concerns or complaints and the patient was discharged in good condition.  This patient was seen with Dr. Micheline Mazeocherty, emergency medicine attending.      Theda Belfasthris Ritika Hellickson, MD 02/11/15 [id number]  Toy CookeyMegan Docherty, MD 02/12/15 [phone]

## 2015-02-11 NOTE — ED Notes (Signed)
Sputum sample collected and sitting at bedside

## 2015-02-11 NOTE — ED Notes (Signed)
Pt c/o burning with urination and pt sts when she wakes up in the morning has productive cough

## 2015-02-11 NOTE — ED Notes (Signed)
Pt undressed, in gown, on continuous pulse oximetry and blood pressure cuff; warm blankets given; pt attempting to provide an urine specimen at this time

## 2015-02-13 LAB — URINE CULTURE: Colony Count: >=100000

## 2015-02-15 ENCOUNTER — Telehealth: Payer: Self-pay | Admitting: *Deleted

## 2015-02-15 NOTE — ED Notes (Signed)
(+)  urine culture, treated with Nitrofurantoin, OK per E. Martin, Pharm 

## 2015-03-11 ENCOUNTER — Encounter: Payer: Self-pay | Admitting: Neurology

## 2015-03-11 ENCOUNTER — Ambulatory Visit (INDEPENDENT_AMBULATORY_CARE_PROVIDER_SITE_OTHER): Payer: Medicaid Other | Admitting: Neurology

## 2015-03-11 VITALS — BP 100/80 | HR 63 | Resp 16 | Wt 182.0 lb

## 2015-03-11 DIAGNOSIS — F200 Paranoid schizophrenia: Secondary | ICD-10-CM | POA: Diagnosis not present

## 2015-03-11 DIAGNOSIS — R413 Other amnesia: Secondary | ICD-10-CM

## 2015-03-11 DIAGNOSIS — R51 Headache: Secondary | ICD-10-CM

## 2015-03-11 DIAGNOSIS — R519 Headache, unspecified: Secondary | ICD-10-CM

## 2015-03-11 DIAGNOSIS — R443 Hallucinations, unspecified: Secondary | ICD-10-CM | POA: Diagnosis not present

## 2015-03-11 NOTE — Patient Instructions (Signed)
1. Schedule MRI brain with and without contrast 2. Schedule routine EEG 3. Continue with close follow-up with your psychiatrist and therapist 4. Follow-up after the tests
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
## 2015-03-12 DIAGNOSIS — F0390 Unspecified dementia without behavioral disturbance: Secondary | ICD-10-CM | POA: Insufficient documentation

## 2015-03-12 DIAGNOSIS — F0150 Vascular dementia without behavioral disturbance: Secondary | ICD-10-CM | POA: Insufficient documentation

## 2015-03-12 DIAGNOSIS — R443 Hallucinations, unspecified: Secondary | ICD-10-CM | POA: Insufficient documentation

## 2015-03-12 DIAGNOSIS — R519 Headache, unspecified: Secondary | ICD-10-CM | POA: Insufficient documentation

## 2015-03-12 DIAGNOSIS — F200 Paranoid schizophrenia: Secondary | ICD-10-CM | POA: Insufficient documentation

## 2015-03-12 DIAGNOSIS — R51 Headache: Secondary | ICD-10-CM

## 2015-03-15 ENCOUNTER — Ambulatory Visit (INDEPENDENT_AMBULATORY_CARE_PROVIDER_SITE_OTHER): Payer: Medicaid Other | Admitting: Neurology

## 2015-03-15 DIAGNOSIS — R51 Headache: Secondary | ICD-10-CM | POA: Diagnosis not present

## 2015-03-15 DIAGNOSIS — R413 Other amnesia: Secondary | ICD-10-CM | POA: Diagnosis not present

## 2015-03-15 DIAGNOSIS — R519 Headache, unspecified: Secondary | ICD-10-CM

## 2015-03-17 NOTE — Procedures (Signed)
ELECTROENCEPHALOGRAM REPORT  Date of Study: 03/15/2015  Patient's Name: J[facility] [name] MRN: [id number] Date of Birth: 09-Mar-1971  Referring Provider: Dr. Patrcia Dolly  Clinical History: This is a 44 year old woman with auditory and olfactory hallucinations, memory loss. EEG to assess for subclinical seizures  Medications: Haldol, Buspar, Cogentin, Atarax, Metformin  Technical Summary: A multichannel digital EEG recording measured by the international 10-20 system with electrodes applied with paste and impedances below 5000 ohms performed in our laboratory with EKG monitoring in an awake and asleep patient.  Hyperventilation and photic stimulation were performed.  The digital EEG was referentially recorded, reformatted, and digitally filtered in a variety of bipolar and referential montages for optimal display.    Description: The patient is awake and asleep during the recording.  During maximal wakefulness, there is a symmetric, medium voltage 10-10.5 Hz posterior dominant rhythm that attenuates with eye opening.  The record is symmetric.  During drowsiness and stage I sleep, there is an increase in theta slowing of the background with vertex waves seen.  Hyperventilation and photic stimulation did not elicit any abnormalities.  There is eye flutter artifact, as well as artifact over the frontopolar leads toward the end of the study. There were no epileptiform discharges or electrographic seizures seen.    EKG lead was unremarkable.  Impression: This awake and asleep EEG is normal.    Clinical Correlation: A normal EEG does not exclude a clinical diagnosis of epilepsy.  If further clinical questions remain, prolonged EEG may be helpful.  Clinical correlation is advised.   Patrcia Dolly, M.D.

## 2015-03-18 ENCOUNTER — Telehealth: Payer: Self-pay | Admitting: Family Medicine

## 2015-03-18 NOTE — Telephone Encounter (Signed)
-----   Message from Van Clines, MD sent at 03/17/2015  4:28 PM EDT ----- Pls let her know EEG is normal. Thanks

## 2015-03-18 NOTE — Telephone Encounter (Signed)
Lmovm to return my call. 

## 2015-03-19 NOTE — Telephone Encounter (Signed)
Patient was notified of result by Alcario Drought on 03/18/15.

## 2015-03-24 ENCOUNTER — Ambulatory Visit (HOSPITAL_COMMUNITY)
Admission: RE | Admit: 2015-03-24 | Discharge: 2015-03-24 | Disposition: A | Discharge status: Home / Self Care | Payer: Medicaid Other | Source: Ambulatory Visit | Attending: Neurology | Admitting: Neurology

## 2015-03-24 ENCOUNTER — Telehealth: Payer: Self-pay | Admitting: Neurology

## 2015-03-24 DIAGNOSIS — R413 Other amnesia: Secondary | ICD-10-CM | POA: Diagnosis not present

## 2015-03-24 DIAGNOSIS — R51 Headache: Secondary | ICD-10-CM | POA: Diagnosis present

## 2015-03-24 DIAGNOSIS — F209 Schizophrenia, unspecified: Secondary | ICD-10-CM | POA: Diagnosis not present

## 2015-03-24 NOTE — Telephone Encounter (Signed)
I called patient she was notified of results.

## 2015-03-24 NOTE — Telephone Encounter (Signed)
Please review

## 2015-03-24 NOTE — Telephone Encounter (Signed)
Pt presented for her MRI brain w/ and w/o contrast today. Per Harle Stanford with radiology, they were unable to get access to the vein today and therefore unable to admin. contrast. Radiologist did review the scan w/o contrast and said it is his opinion that the contrast dye is not needed. Please call with any questions, Cb# [phone] / Sherri S.

## 2015-03-24 NOTE — Telephone Encounter (Signed)
Noted. Pls let patient know I reviewed MRI brain, it is unremarkable, no evidence of tumor, stroke, or bleed. Thanks

## 2015-03-26 ENCOUNTER — Ambulatory Visit (INDEPENDENT_AMBULATORY_CARE_PROVIDER_SITE_OTHER): Payer: Medicaid Other | Admitting: Neurology

## 2015-03-26 ENCOUNTER — Encounter: Payer: Self-pay | Admitting: Neurology

## 2015-03-26 VITALS — BP 114/80 | HR 67 | Resp 16 | Ht 64.0 in | Wt 179.0 lb

## 2015-03-26 DIAGNOSIS — R51 Headache: Secondary | ICD-10-CM | POA: Diagnosis not present

## 2015-03-26 DIAGNOSIS — R413 Other amnesia: Secondary | ICD-10-CM

## 2015-03-26 DIAGNOSIS — R519 Headache, unspecified: Secondary | ICD-10-CM

## 2015-03-26 DIAGNOSIS — F200 Paranoid schizophrenia: Secondary | ICD-10-CM | POA: Diagnosis not present
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
## 2015-03-26 NOTE — Patient Instructions (Signed)
1. Ask your psychiatrist if okay to start Depakote for headaches. Once cleared with them, call our office and we will call in a prescription 2. Minimize Tylenol, Advil, Ibuprofen, Aleve or any other over the counter medication to 2-3 a week to avoid rebound headaches 3. Continue all your medications as prescribed, do not stop medication without discussing with your doctor first 4. Keep a calendar of your headaches 5. Follow-up in 3 months

## 2015-05-24 ENCOUNTER — Telehealth: Payer: Self-pay | Admitting: Internal Medicine

## 2015-05-24 NOTE — Telephone Encounter (Signed)
Patient called requesting medication refill for nicotine (NICODERM CQ - DOSED IN MG/24 HOURS) 21 mg/24hr patch, please f/u with patient

## 2015-06-28 ENCOUNTER — Emergency Department (HOSPITAL_COMMUNITY)
Admission: EM | Admit: 2015-06-28 | Discharge: 2015-06-28 | Disposition: A | Discharge status: Home / Self Care | Payer: Medicaid Other

## 2015-06-28 NOTE — ED Notes (Signed)
Called for triage without answer 

## 2015-06-29 ENCOUNTER — Ambulatory Visit: Payer: Self-pay | Admitting: Neurology

## 2015-07-07 ENCOUNTER — Ambulatory Visit: Payer: Medicaid Other | Attending: Internal Medicine | Admitting: Internal Medicine

## 2015-07-07 ENCOUNTER — Encounter: Payer: Self-pay | Admitting: Internal Medicine

## 2015-07-07 VITALS — BP 117/82 | HR 82 | Temp 99.4°F | Resp 16 | Ht 64.0 in | Wt 173.0 lb

## 2015-07-07 DIAGNOSIS — F909 Attention-deficit hyperactivity disorder, unspecified type: Secondary | ICD-10-CM | POA: Insufficient documentation

## 2015-07-07 DIAGNOSIS — F329 Major depressive disorder, single episode, unspecified: Secondary | ICD-10-CM | POA: Diagnosis not present

## 2015-07-07 DIAGNOSIS — Z7984 Long term (current) use of oral hypoglycemic drugs: Secondary | ICD-10-CM | POA: Insufficient documentation

## 2015-07-07 DIAGNOSIS — Z79899 Other long term (current) drug therapy: Secondary | ICD-10-CM | POA: Insufficient documentation

## 2015-07-07 DIAGNOSIS — Z8249 Family history of ischemic heart disease and other diseases of the circulatory system: Secondary | ICD-10-CM | POA: Diagnosis not present

## 2015-07-07 DIAGNOSIS — R413 Other amnesia: Secondary | ICD-10-CM | POA: Diagnosis present

## 2015-07-07 DIAGNOSIS — Z833 Family history of diabetes mellitus: Secondary | ICD-10-CM | POA: Insufficient documentation

## 2015-07-07 DIAGNOSIS — Z880 Allergy status to penicillin: Secondary | ICD-10-CM | POA: Insufficient documentation

## 2015-07-07 DIAGNOSIS — Z87891 Personal history of nicotine dependence: Secondary | ICD-10-CM | POA: Diagnosis not present

## 2015-07-07 DIAGNOSIS — F419 Anxiety disorder, unspecified: Secondary | ICD-10-CM | POA: Diagnosis not present

## 2015-07-07 NOTE — Progress Notes (Signed)
Patient ID: Jody [name], female   DOB: 12-Oct-1970, 44 y.o.   MRN: [id number]  CC: memory loss  HPI: Jody [name] is a 44 y.o. female here today for a follow up visit.  Patient has past medical history of interstitial cystitis, anxiety, depression, memory loss, and headaches. Patient reports that she has been evaluated by Neurology and her psychiatrist for memory loss and no cause has been found. Review of Neurology notes show that patient was offered a trial of Depakote to see if it would help her mood and underlying psych concerns but refused. Patient states that she was recently seen by [facility] and was placed on Ritalin for ADHD and was told to ask for a neuropsychiatric evaluation to further examine memory loss. Patient states that her self esteem is affected by her memory and causing her to turn to alcohol as a coping mechanism.   Allergies  Allergen Reactions  . Penicillins Itching and Rash   Past Medical History  Diagnosis Date  . Cystitis, interstitial   . Anxiety and depression   . Panic attack    Current Outpatient Prescriptions on File Prior to Visit  Medication Sig Dispense Refill  . benztropine (COGENTIN) 0.5 MG tablet Take 1 tablet (0.5 mg total) by mouth 2 (two) times daily. (Patient taking differently: Take 0.5 mg by mouth at bedtime. ) 60 tablet 0  . busPIRone (BUSPAR) 5 MG tablet Take 1 tablet (5 mg total) by mouth 3 (three) times daily. 90 tablet 0  . FLUoxetine (PROZAC) 20 MG tablet Take 3 capsules by mouth daily    . hydrOXYzine (ATARAX/VISTARIL) 25 MG tablet Take 1 tablet (25 mg total) by mouth 3 (three) times daily as needed for anxiety. 30 tablet 0  . traZODone (DESYREL) 100 MG tablet Take 1 tablet (100 mg total) by mouth at bedtime as needed for sleep. 30 tablet 0  . cyanocobalamin 500 MCG tablet Take 1 tablet (500 mcg total) by mouth daily. 30 tablet 0  . haloperidol (HALDOL) 5 MG tablet Take 1 tablet (5 mg total) by mouth 2 (two) times daily. (Patient not  taking: Reported on 07/07/2015) 60 tablet 0  . metFORMIN (GLUCOPHAGE) 500 MG tablet Take 1 tablet (500 mg total) by mouth daily with breakfast. 30 tablet 0  . nicotine (NICODERM CQ - DOSED IN MG/24 HOURS) 21 mg/24hr patch Place 1 patch (21 mg total) onto the skin daily. 28 patch 0  . [DISCONTINUED] solifenacin (VESICARE) 5 MG tablet Take 5 mg by mouth daily.     No current facility-administered medications on file prior to visit.   Family History  Problem Relation Age of Onset  . Diabetes Father   . Hypertension Mother   . Hypertension Sister   . Lupus Sister   . Heart disease Maternal Grandfather   . Lupus     Social History   Social History  . Marital Status: Single    Spouse Name: N/A  . Number of Children: 5  . Years of Education: N/A   Occupational History  . Not on file.   Social History Main Topics  . Smoking status: Former Smoker -- 0.25 packs/day for 4 years    Types: Cigarettes    Quit date: 03/01/2015  . Smokeless tobacco: Never Used  . Alcohol Use: No  . Drug Use: No  . Sexual Activity: Yes    Birth Control/ Protection: Condom   Other Topics Concern  . Not on file   Social History Narrative  Review of Systems  Neurological: Positive for headaches. Negative for dizziness, tingling, seizures and loss of consciousness.  Psychiatric/Behavioral: Positive for depression, memory loss and substance abuse. Negative for suicidal ideas. Hallucinations: alcohol. The patient is nervous/anxious. The patient does not have insomnia.   All other systems reviewed and are negative.   Objective:   Filed Vitals:   07/07/15 1120  BP: 117/82  Pulse: 82  Temp: 99.4 F (37.4 C)  Resp: 16    Physical Exam  Constitutional: She is oriented to person, place, and time.  Eyes: EOM are normal. Pupils are equal, round, and reactive to light.  Neck: No thyromegaly present.  Cardiovascular: Normal rate, regular rhythm and normal heart sounds.   Pulmonary/Chest: Effort  normal and breath sounds normal.  Musculoskeletal: She exhibits no edema.  Neurological: She is alert and oriented to person, place, and time. No cranial nerve deficit.  Skin: Skin is warm and dry.  Psychiatric: She has a normal mood and affect.     Lab Results  Component Value Date   WBC 4.2 01/29/2015   HGB 12.7 01/29/2015   HCT 40.6 01/29/2015   MCV 74.8* 01/29/2015   PLT 280 01/29/2015   Lab Results  Component Value Date   CREATININE 0.94 01/29/2015   BUN 8 01/29/2015   NA 141 01/29/2015   K 3.7 01/29/2015   CL 108 01/29/2015   CO2 24 01/29/2015    Lab Results  Component Value Date   HGBA1C 5.5 09/18/2014   Lipid Panel     Component Value Date/Time   CHOL 169 09/18/2014 0634   TRIG 96 09/18/2014 0634   HDL 46 09/18/2014 0634   CHOLHDL 3.7 09/18/2014 0634   VLDL 19 09/18/2014 0634   LDLCALC 104* 09/18/2014 0634       Assessment and plan:   Jody [name] was seen today for memory loss.  Diagnoses and all orders for this visit:  Memory loss -     Ambulatory referral to Neuropsychology Will refer out and request psychiatry notes   Return if symptoms worsen or fail to improve.       Jody Finland, NP-C [facility] [phone] 07/07/2015, 11:49 AM

## 2015-07-07 NOTE — Progress Notes (Signed)
Patient here with her sister Patient is requesting a referral to neuro/psych for  Her memory loss Patient has a hard time remembering and is gradually getting worse

## 2015-08-07 ENCOUNTER — Encounter (HOSPITAL_COMMUNITY): Payer: Self-pay | Admitting: Emergency Medicine

## 2015-08-07 ENCOUNTER — Emergency Department (HOSPITAL_COMMUNITY)
Admission: EM | Admit: 2015-08-07 | Discharge: 2015-08-07 | Disposition: A | Discharge status: Home / Self Care | Payer: Medicaid Other | Attending: Emergency Medicine | Admitting: Emergency Medicine

## 2015-08-07 DIAGNOSIS — F329 Major depressive disorder, single episode, unspecified: Secondary | ICD-10-CM | POA: Diagnosis not present

## 2015-08-07 DIAGNOSIS — F419 Anxiety disorder, unspecified: Secondary | ICD-10-CM | POA: Insufficient documentation

## 2015-08-07 DIAGNOSIS — Z79899 Other long term (current) drug therapy: Secondary | ICD-10-CM | POA: Diagnosis not present

## 2015-08-07 DIAGNOSIS — Z880 Allergy status to penicillin: Secondary | ICD-10-CM | POA: Insufficient documentation

## 2015-08-07 DIAGNOSIS — Z87891 Personal history of nicotine dependence: Secondary | ICD-10-CM | POA: Diagnosis not present

## 2015-08-07 DIAGNOSIS — B001 Herpesviral vesicular dermatitis: Secondary | ICD-10-CM | POA: Diagnosis not present

## 2015-08-07 DIAGNOSIS — R21 Rash and other nonspecific skin eruption: Secondary | ICD-10-CM | POA: Diagnosis present

## 2015-08-07 MED ORDER — VALACYCLOVIR HCL 1 G PO TABS: 1000.0000 mg | ORAL_TABLET | Freq: Two times a day (BID) | ORAL | Status: DC

## 2015-08-07 MED ORDER — VALACYCLOVIR HCL 500 MG PO TABS
1000.0000 mg | ORAL_TABLET | Freq: Once | ORAL | Status: AC
  Administered 2015-08-07: 1000 mg via ORAL
  Filled 2015-08-07: qty 2

## 2015-08-07 NOTE — ED Notes (Signed)
Pt left with all her belongings and ambulated out of the treatment area.  

## 2015-08-07 NOTE — Discharge Instructions (Signed)
Cold Sore [name], take medicine as directed for your cold sore.  See your primary doctor within 3 days for close follow up.  If symptoms worsen, come back to the ED immediately.  Thank you. A cold sore (fever blister) is a skin infection caused by a certain type of germ (virus). They are small sores filled with fluid that dry up and heal within 2 weeks. Cold sores form inside of the mouth or on the lips, gums, and other parts of the body. Cold sores can be easily passed (contagious) to other people. This can happen through close personal contact, such as kissing or sharing a drinking glass. HOME CARE  Only take medicine as told by your doctor. Do not use aspirin.  Use a cotton-tip swab to put creams or gels on your sores.  Do not touch sores or pick scabs. Wash your hands often. Do not touch your eyes without washing your hands first.  Avoid kissing, oral sex, and sharing personal items until the sores heal.  Put an ice pack on your sores for 10-15 minutes to ease discomfort.  Avoid hot, cold, or salty foods. Eat a soft, bland diet. Use a straw to drink if it helps lessen pain.  Keep sores clean and dry.  Avoid the sun and limit stress if these things cause you to have sores. Apply sunscreen on your lips if the sun causes cold sores. GET HELP IF:  You have a fever or lasting symptoms for more than 2-3 days.  You have a fever and your symptoms suddenly get worse.  You have yellow-white fluid (not clear) coming from the sores.  You have redness that is spreading.  You have pain or irritation in your eye.  You get sores on your genitals.  Your sores do not heal within 2 weeks.  You have a tough time fighting off sickness and infections (weakened immune system).  You get cold sores often. MAKE SURE YOU:   Understand these instructions.  Will watch your condition.  Will get help right away if you are not doing well or get worse.   This information is not intended to  replace advice given to you by your health care provider. Make sure you discuss any questions you have with your health care provider.   Document Released: 03/19/2012 Document Reviewed: 03/19/2012 Elsevier Interactive Patient Education [employer]2016 [employer] Inc[employer]

## 2015-08-07 NOTE — ED Provider Notes (Signed)
CSN: [id number]     Arrival date & time 08/07/15  0421 History   First MD Initiated Contact with Patient 08/07/15 0423     Chief Complaint  Patient presents with  . Herpes Zoster     (Consider location/radiation/quality/duration/timing/severity/associated sxs/prior Treatment) HPI   Ms. Nylander is a 44yo female, no sign PMH, here for med refill.  Patient states she was recently diagnosed with herpes labialis and was DC'ed with medication that she lost.  She does not know the name but states she took it twice per day.  She is requesting a refill of this medication. She states she feels the soreness in her mouth. Patient's denying any sore throat or difficulty feeding. She's had no nausea or vomiting. Patient has no fevers. She has no further complaints.  10 Systems reviewed and are negative for acute change except as noted in the HPI.    Past Medical History  Diagnosis Date  . Cystitis, interstitial   . Anxiety and depression   . Panic attack    Past Surgical History  Procedure Laterality Date  . Tubal ligation     Family History  Problem Relation Age of Onset  . Diabetes Father   . Hypertension Mother   . Hypertension Sister   . Lupus Sister   . Heart disease Maternal Grandfather   . Lupus     Social History  Substance Use Topics  . Smoking status: Former Smoker -- 0.25 packs/day for 4 years    Types: Cigarettes    Quit date: 03/01/2015  . Smokeless tobacco: Never Used  . Alcohol Use: No   OB History    No data available     Review of Systems    Allergies  Penicillins  Home Medications   Prior to Admission medications   Medication Sig Start Date End Date Taking? Authorizing Provider  benztropine (COGENTIN) 0.5 MG tablet Take 1 tablet (0.5 mg total) by mouth 2 (two) times daily. Patient taking differently: Take 0.5 mg by mouth at bedtime.  02/02/15   Thermon Leyland, NP  busPIRone (BUSPAR) 5 MG tablet Take 1 tablet (5 mg total) by mouth 3 (three) times daily.  02/02/15   Thermon Leyland, NP  cyanocobalamin 500 MCG tablet Take 1 tablet (500 mcg total) by mouth daily. 02/02/15   Thermon Leyland, NP  FLUoxetine (PROZAC) 20 MG tablet Take 3 capsules by mouth daily    Historical Provider, MD  hydrOXYzine (ATARAX/VISTARIL) 25 MG tablet Take 1 tablet (25 mg total) by mouth 3 (three) times daily as needed for anxiety. 02/02/15   Thermon Leyland, NP  metFORMIN (GLUCOPHAGE) 500 MG tablet Take 1 tablet (500 mg total) by mouth daily with breakfast. 02/03/15   Thermon Leyland, NP  Methylphenidate HCl (RITALIN PO) Take by mouth.    Historical Provider, MD  nicotine (NICODERM CQ - DOSED IN MG/24 HOURS) 21 mg/24hr patch Place 1 patch (21 mg total) onto the skin daily. 02/02/15   Thermon Leyland, NP  traZODone (DESYREL) 100 MG tablet Take 1 tablet (100 mg total) by mouth at bedtime as needed for sleep. 02/02/15   Thermon Leyland, NP  valACYclovir (VALTREX) 1000 MG tablet Take 1 tablet (1,000 mg total) by mouth 2 (two) times daily. 08/07/15   Tomasita Crumble, MD   BP 128/77 mmHg  Pulse 68  Temp(Src) 98.6 F (37 C) (Oral)  Resp 18  Ht  (1.626 m)  Wt 165 lb (74.844 kg)  BMI 28.31  kg/m2  SpO2 99% Physical Exam  Constitutional: She is oriented to person, place, and time. She appears well-developed and well-nourished. No distress.  HENT:  Head: Normocephalic and atraumatic.  Nose: Nose normal.  Mouth/Throat: No oropharyngeal exudate.  Moist oropharynx. Blisters seen in the left cheek. There is mild skin sloughing and erythema.  Eyes: Conjunctivae and EOM are normal. Pupils are equal, round, and reactive to light. No scleral icterus.  Neck: Normal range of motion. Neck supple. No JVD present. No tracheal deviation present. No thyromegaly present.  Cardiovascular: Normal rate, regular rhythm and normal heart sounds.  Exam reveals no gallop and no friction rub.   No murmur heard. Pulmonary/Chest: Effort normal and breath sounds normal. No respiratory distress. She has no wheezes. She  exhibits no tenderness.  Abdominal: Soft. Bowel sounds are normal. She exhibits no distension and no mass. There is no tenderness. There is no rebound and no guarding.  Musculoskeletal: Normal range of motion. She exhibits no edema or tenderness.  Lymphadenopathy:    She has no cervical adenopathy.  Neurological: She is alert and oriented to person, place, and time. No cranial nerve deficit. She exhibits normal muscle tone.  Skin: Skin is warm and dry. No rash noted. No erythema. No pallor.  Nursing note and vitals reviewed.   ED Course  Procedures (including critical care time) Labs Review Labs Reviewed - No data to display  Imaging Review No results found. I have personally reviewed and evaluated these images and lab results as part of my medical decision-making.   EKG Interpretation None      MDM   Final diagnoses:  Herpes labialis   patient presents to the emergency department for medication refill. Physical exam does show cold sores inside the mouth. She states she took the medication twice per day, this is likely Valtrex and less likely acyclovir. I'm unsure why she had treatment for such long duration, however I will give the patient 5 day course of Valtrex. Initial dose given emergency primary. Prescription provided. Primary care follow-up is advised in 3 days, patient appears well and in no acute distress. Vital signs were within her normal limits and she is safe for discharge.    Tomasita CrumbleAdeleke Risha Barretta, MD 08/07/15 [id number]

## 2015-08-07 NOTE — ED Notes (Signed)
Pt reports to hospital for prescription for her oral herpes. Pt stated she went out of town and left her medication there. Pt alert and oriented x4.

## 2015-09-28 ENCOUNTER — Encounter (HOSPITAL_COMMUNITY): Payer: Self-pay | Admitting: Emergency Medicine

## 2015-09-28 ENCOUNTER — Emergency Department (HOSPITAL_COMMUNITY): Payer: Medicaid Other

## 2015-09-28 ENCOUNTER — Observation Stay (HOSPITAL_COMMUNITY)
Admission: EM | Admit: 2015-09-28 | Discharge: 2015-09-29 | Disposition: A | Discharge status: Home / Self Care | Payer: Medicaid Other | Attending: Cardiovascular Disease | Admitting: Cardiovascular Disease

## 2015-09-28 ENCOUNTER — Other Ambulatory Visit: Payer: Self-pay

## 2015-09-28 DIAGNOSIS — Z720 Tobacco use: Secondary | ICD-10-CM | POA: Diagnosis not present

## 2015-09-28 DIAGNOSIS — F32A Depression, unspecified: Secondary | ICD-10-CM

## 2015-09-28 DIAGNOSIS — D509 Iron deficiency anemia, unspecified: Secondary | ICD-10-CM | POA: Diagnosis not present

## 2015-09-28 DIAGNOSIS — Z7984 Long term (current) use of oral hypoglycemic drugs: Secondary | ICD-10-CM | POA: Insufficient documentation

## 2015-09-28 DIAGNOSIS — Z880 Allergy status to penicillin: Secondary | ICD-10-CM | POA: Insufficient documentation

## 2015-09-28 DIAGNOSIS — R0789 Other chest pain: Principal | ICD-10-CM

## 2015-09-28 DIAGNOSIS — R208 Other disturbances of skin sensation: Secondary | ICD-10-CM | POA: Diagnosis not present

## 2015-09-28 DIAGNOSIS — R7989 Other specified abnormal findings of blood chemistry: Secondary | ICD-10-CM

## 2015-09-28 DIAGNOSIS — R0781 Pleurodynia: Secondary | ICD-10-CM | POA: Diagnosis not present

## 2015-09-28 DIAGNOSIS — R079 Chest pain, unspecified: Secondary | ICD-10-CM

## 2015-09-28 DIAGNOSIS — R778 Other specified abnormalities of plasma proteins: Secondary | ICD-10-CM

## 2015-09-28 DIAGNOSIS — R748 Abnormal levels of other serum enzymes: Secondary | ICD-10-CM | POA: Insufficient documentation

## 2015-09-28 DIAGNOSIS — F17210 Nicotine dependence, cigarettes, uncomplicated: Secondary | ICD-10-CM | POA: Diagnosis not present

## 2015-09-28 DIAGNOSIS — R200 Anesthesia of skin: Secondary | ICD-10-CM | POA: Diagnosis not present

## 2015-09-28 DIAGNOSIS — F329 Major depressive disorder, single episode, unspecified: Secondary | ICD-10-CM | POA: Insufficient documentation

## 2015-09-28 DIAGNOSIS — F251 Schizoaffective disorder, depressive type: Secondary | ICD-10-CM

## 2015-09-28 DIAGNOSIS — F200 Paranoid schizophrenia: Secondary | ICD-10-CM | POA: Diagnosis not present

## 2015-09-28 HISTORY — DX: Tobacco use: Z72.0

## 2015-09-28 HISTORY — DX: Other specified abnormalities of plasma proteins: R77.8

## 2015-09-28 HISTORY — DX: Iron deficiency anemia, unspecified: D50.9

## 2015-09-28 HISTORY — DX: Other specified abnormal findings of blood chemistry: R79.89

## 2015-09-28 HISTORY — DX: Paranoid schizophrenia: F20.0

## 2015-09-28 LAB — CBC
HEMATOCRIT: 38.5 % (ref 36.0–46.0)
HEMOGLOBIN: 11.9 g/dL — AB (ref 12.0–15.0)
MCH: 23.5 pg — ABNORMAL LOW (ref 26.0–34.0)
MCHC: 30.9 g/dL (ref 30.0–36.0)
MCV: 75.9 fL — ABNORMAL LOW (ref 78.0–100.0)
Platelets: 238 10*3/uL (ref 150–400)
RBC: 5.07 MIL/uL (ref 3.87–5.11)
RDW: 18 % — AB (ref 11.5–15.5)
WBC: 5.7 10*3/uL (ref 4.0–10.5)

## 2015-09-28 LAB — BASIC METABOLIC PANEL
ANION GAP: 10 (ref 5–15)
BUN: 8 mg/dL (ref 6–20)
CALCIUM: 9.3 mg/dL (ref 8.9–10.3)
CO2: 21 mmol/L — ABNORMAL LOW (ref 22–32)
Chloride: 106 mmol/L (ref 101–111)
Creatinine, Ser: 1 mg/dL (ref 0.44–1.00)
GFR calc Af Amer: >60 mL/min (ref >60)
GLUCOSE: 117 mg/dL — AB (ref 65–99)
POTASSIUM: 3.8 mmol/L (ref 3.5–5.1)
SODIUM: 137 mmol/L (ref 135–145)

## 2015-09-28 LAB — D-DIMER, QUANTITATIVE (NOT AT ARMC): D DIMER QUANT: 1.25 ug{FEU}/mL — AB (ref 0.00–0.50)

## 2015-09-28 LAB — TROPONIN I
TROPONIN I: 0.05 ng/mL — AB (ref <0.031)
TROPONIN I: 0.04 ng/mL — AB (ref <0.031)
TROPONIN I: 0.07 ng/mL — AB (ref <0.031)

## 2015-09-28 LAB — POC URINE PREG, ED: Preg Test, Ur: NEGATIVE

## 2015-09-28 MED ORDER — FLUOXETINE HCL 20 MG PO TABS
60.0000 mg | ORAL_TABLET | Freq: Every day | ORAL | Status: DC
  Filled 2015-09-28: qty 3

## 2015-09-28 MED ORDER — ONDANSETRON HCL 4 MG/2ML IJ SOLN: 4.0000 mg | Freq: Four times a day (QID) | INTRAMUSCULAR | Status: DC | PRN

## 2015-09-28 MED ORDER — IOHEXOL 350 MG/ML SOLN
80.0000 mL | Freq: Once | INTRAVENOUS | Status: AC | PRN
  Administered 2015-09-28: 80 mL via INTRAVENOUS

## 2015-09-28 MED ORDER — BUSPIRONE HCL 5 MG PO TABS
5.0000 mg | ORAL_TABLET | Freq: Three times a day (TID) | ORAL | Status: DC
  Administered 2015-09-29: 5 mg via ORAL
  Filled 2015-09-28: qty 1

## 2015-09-28 MED ORDER — BENZTROPINE MESYLATE 0.5 MG PO TABS
0.5000 mg | ORAL_TABLET | Freq: Every day | ORAL | Status: DC
  Filled 2015-09-28 (×2): qty 1

## 2015-09-28 MED ORDER — VALACYCLOVIR HCL 500 MG PO TABS
1000.0000 mg | ORAL_TABLET | Freq: Two times a day (BID) | ORAL | Status: DC
  Filled 2015-09-28: qty 2

## 2015-09-28 MED ORDER — SODIUM CHLORIDE 0.9 % IV SOLN: 250.0000 mL | INTRAVENOUS | Status: DC | PRN

## 2015-09-28 MED ORDER — VITAMIN B-12 1000 MCG PO TABS
500.0000 ug | ORAL_TABLET | Freq: Every day | ORAL | Status: DC
  Administered 2015-09-29: 500 ug via ORAL
  Filled 2015-09-28: qty 1

## 2015-09-28 MED ORDER — SODIUM CHLORIDE 0.9 % IJ SOLN
3.0000 mL | Freq: Two times a day (BID) | INTRAMUSCULAR | Status: DC
  Administered 2015-09-29 (×2): 3 mL via INTRAVENOUS

## 2015-09-28 MED ORDER — HYDROXYZINE HCL 25 MG PO TABS: 25.0000 mg | ORAL_TABLET | Freq: Three times a day (TID) | ORAL | Status: DC | PRN

## 2015-09-28 MED ORDER — HEPARIN SODIUM (PORCINE) 5000 UNIT/ML IJ SOLN
5000.0000 [IU] | Freq: Three times a day (TID) | INTRAMUSCULAR | Status: DC
  Administered 2015-09-28 – 2015-09-29 (×3): 5000 [IU] via SUBCUTANEOUS
  Filled 2015-09-28 (×3): qty 1

## 2015-09-28 MED ORDER — ACETAMINOPHEN 325 MG PO TABS: 650.0000 mg | ORAL_TABLET | ORAL | Status: DC | PRN

## 2015-09-28 MED ORDER — ALPRAZOLAM 0.25 MG PO TABS: 0.2500 mg | ORAL_TABLET | Freq: Two times a day (BID) | ORAL | Status: DC | PRN

## 2015-09-28 MED ORDER — NICOTINE 21 MG/24HR TD PT24
21.0000 mg | MEDICATED_PATCH | Freq: Every day | TRANSDERMAL | Status: DC
  Administered 2015-09-29: 21 mg via TRANSDERMAL
  Filled 2015-09-28: qty 1

## 2015-09-28 MED ORDER — ACETAMINOPHEN 325 MG PO TABS
325.0000 mg | ORAL_TABLET | Freq: Once | ORAL | Status: DC
Start: 2015-09-28 — End: 2015-09-28

## 2015-09-28 MED ORDER — ASPIRIN 81 MG PO CHEW
324.0000 mg | CHEWABLE_TABLET | Freq: Once | ORAL | Status: AC
  Administered 2015-09-28: 324 mg via ORAL
  Filled 2015-09-28: qty 4

## 2015-09-28 MED ORDER — TRAZODONE HCL 100 MG PO TABS
100.0000 mg | ORAL_TABLET | Freq: Every evening | ORAL | Status: DC | PRN
  Administered 2015-09-28: 100 mg via ORAL
  Filled 2015-09-28: qty 1

## 2015-09-28 MED ORDER — ASPIRIN EC 81 MG PO TBEC
81.0000 mg | DELAYED_RELEASE_TABLET | Freq: Every day | ORAL | Status: DC
  Administered 2015-09-29: 81 mg via ORAL
  Filled 2015-09-28: qty 1

## 2015-09-28 MED ORDER — NITROGLYCERIN 0.4 MG SL SUBL: 0.4000 mg | SUBLINGUAL_TABLET | SUBLINGUAL | Status: DC | PRN

## 2015-09-28 MED ORDER — SODIUM CHLORIDE 0.9 % IJ SOLN: 3.0000 mL | INTRAMUSCULAR | Status: DC | PRN

## 2015-09-28 MED ORDER — ACETAMINOPHEN 325 MG PO TABS
650.0000 mg | ORAL_TABLET | Freq: Once | ORAL | Status: AC
  Administered 2015-09-28: 650 mg via ORAL
  Filled 2015-09-28: qty 2

## 2015-09-28 NOTE — ED Notes (Signed)
C/o CP 1 month which worsened last night and woke pt from sleep, radiation to left arm and dizziness, no other complaints, A/O X4, ambulatory and in NAD

## 2015-09-28 NOTE — ED Notes (Signed)
Notified CT that pt has appropriate IV 

## 2015-09-28 NOTE — ED Notes (Signed)
Paged cards to Dr Rhunette CroftNanavati @ 19:03pm

## 2015-09-28 NOTE — H&P (Signed)
Physician History and Physical    Jody [name] MRN: [id number] DOB/AGE: May 26, 1971 44 y.o. Admit date: 09/28/2015  Primary Care Physician: Primary Cardiologist: None  HPI: The patient is a 44 yr old woman with a PMH significant for depression, paranoid schizophrenia, and tobacco abuse who has been experiencing intermittent chest pain for the past month. She said it actually began about 3 months ago but has been worse in the past four weeks, occurring once every other day. It is not worse with exertion. Describes it as both "sharp and dull" and located in the left inframammary region, with radiation down the left arm with finger tingling/numbness. Also has associated dizziness and shortness of breath, but no syncope. Denies significant GERD. Has been smoking one pack of cigarettes over three days and has done so for 4-5 years. No premature h/o CAD in family. Denies leg swelling, orthopnea, and PND.  Troponins minimally elevated (0.05-->0.04-->0.07) and d-dimer elevated (1.25). CXR normal. CT angiogram of chest showed no pulmonary embolism with trace b/l pleural effusions and atelectasis.  ECG showed sinus rhythm with artifact, QTc 488 ms, and no gross ischemic ST-T abnormalities.  Takes metformin for weight loss associated with psychiatric meds, and denies a h/o diabetes.  Review of systems complete and found to be negative unless listed above.  No family history of premature CAD in 1st degree relatives.  Social History   Social History  . Marital Status: Single    Spouse Name: N/A  . Number of Children: 5  . Years of Education: N/A   Occupational History  . Not on file.   Social History Main Topics  . Smoking status: Current Every Day Smoker -- 0.25 packs/day for 4 years    Types: Cigarettes    Last Attempt to Quit: 03/01/2015  . Smokeless tobacco: Never Used  . Alcohol Use: 0.0 oz/week    0 Standard drinks or equivalent per week  . Drug Use: No  . Sexual Activity:  Yes    Birth Control/ Protection: Condom   Other Topics Concern  . Not on file   Social History Narrative      Physical Exam: Blood pressure 118/90, pulse 92, temperature 98.3 F (36.8 C), temperature source Oral, resp. rate 22, height '5\' 4"'  (1.626 m), weight 168 lb 4.8 oz (76.34 kg), last menstrual period 09/21/2015, SpO2 100 %.  General: NAD Neck: No JVD, no thyromegaly or thyroid nodule.  Lungs: Clear to auscultation bilaterally with normal respiratory effort. CV: Nondisplaced PMI.  Heart regular S1/S2, no S3/S4, no murmur.  No peripheral edema.  No carotid bruit.  Normal pedal pulses.  Abdomen: Soft, nontender, no hepatosplenomegaly, no distention.  Skin: Intact without lesions or rashes.  Neurologic: Alert and oriented x 3.  Psych: Normal affect. Extremities: No clubbing or cyanosis.  HEENT: Normal.   Labs:   Lab Results  Component Value Date   WBC 5.7 09/28/2015   HGB 11.9* 09/28/2015   HCT 38.5 09/28/2015   MCV 75.9* 09/28/2015   PLT 238 09/28/2015    Recent Labs Lab 09/28/15 1047  NA 137  K 3.8  CL 106  CO2 21*  BUN 8  CREATININE 1.00  CALCIUM 9.3  GLUCOSE 117*   Lab Results  Component Value Date   TROPONINI 0.07* 09/28/2015    Lab Results  Component Value Date   CHOL 169 09/18/2014   Lab Results  Component Value Date   HDL 46 09/18/2014   Lab Results  Component Value Date  [place] 104* 09/18/2014   Lab Results  Component Value Date   TRIG 96 09/18/2014   Lab Results  Component Value Date   CHOLHDL 3.7 09/18/2014   No results found for: LDLDIRECT        ASSESSMENT AND PLAN:  1. Chest pain with troponin elevation: Has both typical and atypical features of ischemic heart disease. Troponins only minimally elevated. Primary risk factor is tobacco abuse. Will check one additional serum troponin. Will obtain echocardiogram on 12/28. If LVEF is reduced and/or wall motion abnormalities are present, would then consider coronary angiography.  Otherwise, would consider stress testing. Will check lipids, ESR, and urine drug screen as well.  Signed: Kate Sable, M.D., F.A.C.C. 09/28/2015, 8:04 PM

## 2015-09-28 NOTE — ED Provider Notes (Signed)
CSN: [id number]     Arrival date & time 09/28/15  1019 History   First MD Initiated Contact with Patient 09/28/15 1223     Chief Complaint  Patient presents with  . Chest Pain     HPI Pt has been having pain off and on for the last month.  The pain will occur in the left chest and go to her left arm.  Sometimes the pain is sharp and other times it is dull aching.  The pain was more sever last night and she couldn't sleep well. No nausea or shortness of breath.  Sometimes she feels dizzy.  Nothing that she can think of brings the pain on.  No leg swelling.  No history of heart or lung disease.  No known heart or lung disease in the immediate family. Past Medical History  Diagnosis Date  . Cystitis, interstitial   . Anxiety and depression   . Panic attack    Past Surgical History  Procedure Laterality Date  . Tubal ligation     Family History  Problem Relation Age of Onset  . Diabetes Father   . Hypertension Mother   . Hypertension Sister   . Lupus Sister   . Heart disease Maternal Grandfather   . Lupus     Social History  Substance Use Topics  . Smoking status: Current Every Day Smoker -- 0.25 packs/day for 4 years    Types: Cigarettes    Last Attempt to Quit: 03/01/2015  . Smokeless tobacco: Never Used  . Alcohol Use: 0.0 oz/week    0 Standard drinks or equivalent per week   OB History    No data available     Review of Systems  All other systems reviewed and are negative.     Allergies  Penicillins  Home Medications   Prior to Admission medications   Medication Sig Start Date End Date Taking? Authorizing Provider  benztropine (COGENTIN) 0.5 MG tablet Take 1 tablet (0.5 mg total) by mouth 2 (two) times daily. Patient taking differently: Take 0.5 mg by mouth at bedtime.  02/02/15  Yes Thermon Leyland, NP  busPIRone (BUSPAR) 5 MG tablet Take 1 tablet (5 mg total) by mouth 3 (three) times daily. 02/02/15  Yes Thermon Leyland, NP  cyanocobalamin 500 MCG tablet Take 1  tablet (500 mcg total) by mouth daily. 02/02/15  Yes Thermon Leyland, NP  FLUoxetine (PROZAC) 20 MG tablet Take 3 capsules by mouth daily   Yes Historical Provider, MD  hydrOXYzine (ATARAX/VISTARIL) 25 MG tablet Take 1 tablet (25 mg total) by mouth 3 (three) times daily as needed for anxiety. 02/02/15  Yes Thermon Leyland, NP  metFORMIN (GLUCOPHAGE) 500 MG tablet Take 1 tablet (500 mg total) by mouth daily with breakfast. 02/03/15  Yes Thermon Leyland, NP  methylphenidate (RITALIN LA) 40 MG 24 hr capsule Take 40 mg by mouth 2 (two) times daily.   Yes Historical Provider, MD  nicotine (NICODERM CQ - DOSED IN MG/24 HOURS) 21 mg/24hr patch Place 1 patch (21 mg total) onto the skin daily. 02/02/15  Yes Thermon Leyland, NP  traZODone (DESYREL) 100 MG tablet Take 1 tablet (100 mg total) by mouth at bedtime as needed for sleep. 02/02/15  Yes Thermon Leyland, NP  valACYclovir (VALTREX) 1000 MG tablet Take 1 tablet (1,000 mg total) by mouth 2 (two) times daily. 08/07/15  Yes Tomasita Crumble, MD   BP 133/99 mmHg  Pulse 75  Temp(Src) 98.3 F (36.8  C) (Oral)  Resp 18  Ht 5\' 4"  (1.626 m)  Wt 76.34 kg  BMI 28.87 kg/m2  SpO2 100%  LMP 09/21/2015 Physical Exam  Constitutional: She appears well-developed and well-nourished. No distress.  HENT:  Head: Normocephalic and atraumatic.  Right Ear: External ear normal.  Left Ear: External ear normal.  Eyes: Conjunctivae are normal. Right eye exhibits no discharge. Left eye exhibits no discharge. No scleral icterus.  Neck: Neck supple. No tracheal deviation present.  Cardiovascular: Normal rate, regular rhythm and intact distal pulses.   Pulmonary/Chest: Effort normal and breath sounds normal. No stridor. No respiratory distress. She has no wheezes. She has no rales.  Abdominal: Soft. Bowel sounds are normal. She exhibits no distension. There is no tenderness. There is no rebound and no guarding.  Musculoskeletal: She exhibits no edema or tenderness.  Neurological: She is alert. She  has normal strength. No cranial nerve deficit (no facial droop, extraocular movements intact, no slurred speech) or sensory deficit. She exhibits normal muscle tone. She displays no seizure activity. Coordination normal.  Skin: Skin is warm and dry. No rash noted.  Psychiatric: She has a normal mood and affect.  Nursing note and vitals reviewed.   ED Course  Procedures (including critical care time) Labs Review Labs Reviewed  BASIC METABOLIC PANEL - Abnormal; Notable for the following:    CO2 21 (*)    Glucose, Bld 117 (*)    All other components within normal limits  CBC - Abnormal; Notable for the following:    Hemoglobin 11.9 (*)    MCV 75.9 (*)    MCH 23.5 (*)    RDW 18.0 (*)    All other components within normal limits  TROPONIN I - Abnormal; Notable for the following:    Troponin I 0.05 (*)    All other components within normal limits  D-DIMER, QUANTITATIVE (NOT AT [facility]) - Abnormal; Notable for the following:    D-Dimer, Quant 1.25 (*)    All other components within normal limits  TROPONIN I - Abnormal; Notable for the following:    Troponin I 0.04 (*)    All other components within normal limits  POC URINE PREG, ED    Imaging Review Dg Chest 2 View  09/28/2015  CLINICAL DATA:  Left-sided chest pain for 1 month worse in last night EXAM: CHEST  2 VIEW COMPARISON:  02/11/2015 FINDINGS: There is a linear band of airspace disease at the right costophrenic angle likely reflecting atelectasis versus scarring. There is no focal parenchymal opacity. There is no pleural effusion or pneumothorax. The heart and mediastinal contours are unremarkable. The osseous structures are unremarkable. IMPRESSION: No active cardiopulmonary disease. Electronically Signed   By: Elige KoHetal  Patel   On: 09/28/2015 12:13   I have personally reviewed and evaluated these images and lab results as part of my medical decision-making.   EKG Interpretation   Date/Time:  Tuesday September 28 2015 10:45:28  EST Ventricular Rate:  82 PR Interval:  130 QRS Duration: 68 QT Interval:  418 QTC Calculation: 488 R Axis:   77 Text Interpretation:  Normal sinus rhythm Prolonged QT Abnormal ECG Poor  data quality otherwise  No significant change since last tracing Confirmed  by Kiyara Bouffard  MD-J, Yaneliz Radebaugh ([id number]) on 09/28/2015 12:43:44 PM      MDM   Pt has a slight increase in her troponin.  D dimer is elevated.  CT angio ordered but we are having difficulty with getting IV access for a CT angio.  Pt may end up needing a VQ scan.  Will turn over case to Dr Rhunette Croft.      Linwood Dibbles, MD 09/28/15 [phone]

## 2015-09-28 NOTE — ED Provider Notes (Signed)
  Physical Exam  BP 133/99 mmHg  Pulse 75  Temp(Src) 98.3 F (36.8 C) (Oral)  Resp 18  Ht 5\' 4"  (1.626 m)  Wt 168 lb 4.8 oz (76.34 kg)  BMI 28.87 kg/m2  SpO2 100%  LMP 09/21/2015  Physical Exam  ED Course  Procedures  MDM  Pt with of interstitial cystits, DM?Marland Kitchen. Comes in with chest pain x 1 month, intermittent, L sided. Pain is not exertional or pleuritic. No serious cardiac risk factors. No PE risk factors.  Trop is slightly elevated, 0.05-->0.04 Dimer is elevated.  CT PE is pending. 3rd trop is pending.  If the CT is neg, trop doesn't rise - pt is safe for d/c. EKG is reassuring. Dispo is pending - Dr. Lynelle DoctorKnapp will let me know what the plan is.  Filed Vitals:   09/28/15 1515 09/28/15 1545  BP: 153/104 133/99  Pulse: 84 75  Temp:    Resp: 16 18       Amar Sippel, MD 09/28/15 1639

## 2015-09-28 NOTE — ED Notes (Signed)
Cardiologist at bedside.  

## 2015-09-29 ENCOUNTER — Ambulatory Visit (HOSPITAL_BASED_OUTPATIENT_CLINIC_OR_DEPARTMENT_OTHER): Payer: Medicaid Other

## 2015-09-29 ENCOUNTER — Encounter (HOSPITAL_COMMUNITY)
Admission: EM | Disposition: A | Discharge status: Home / Self Care | Payer: Self-pay | Source: Home / Self Care | Attending: Emergency Medicine

## 2015-09-29 ENCOUNTER — Encounter (HOSPITAL_COMMUNITY): Payer: Self-pay | Admitting: Interventional Cardiology

## 2015-09-29 DIAGNOSIS — R778 Other specified abnormalities of plasma proteins: Secondary | ICD-10-CM | POA: Insufficient documentation

## 2015-09-29 DIAGNOSIS — F32A Depression, unspecified: Secondary | ICD-10-CM

## 2015-09-29 DIAGNOSIS — D509 Iron deficiency anemia, unspecified: Secondary | ICD-10-CM

## 2015-09-29 DIAGNOSIS — R079 Chest pain, unspecified: Secondary | ICD-10-CM

## 2015-09-29 DIAGNOSIS — Z720 Tobacco use: Secondary | ICD-10-CM

## 2015-09-29 DIAGNOSIS — F329 Major depressive disorder, single episode, unspecified: Secondary | ICD-10-CM

## 2015-09-29 DIAGNOSIS — F251 Schizoaffective disorder, depressive type: Secondary | ICD-10-CM

## 2015-09-29 DIAGNOSIS — R7989 Other specified abnormal findings of blood chemistry: Secondary | ICD-10-CM

## 2015-09-29 HISTORY — PX: CARDIAC CATHETERIZATION: SHX172

## 2015-09-29 LAB — RAPID URINE DRUG SCREEN, HOSP PERFORMED
AMPHETAMINES: NOT DETECTED
Barbiturates: NOT DETECTED
Benzodiazepines: NOT DETECTED
COCAINE: NOT DETECTED
OPIATES: NOT DETECTED
TETRAHYDROCANNABINOL: NOT DETECTED

## 2015-09-29 LAB — CBC
HCT: 34.2 % — ABNORMAL LOW (ref 36.0–46.0)
Hemoglobin: 10.7 g/dL — ABNORMAL LOW (ref 12.0–15.0)
MCH: 24 pg — ABNORMAL LOW (ref 26.0–34.0)
MCHC: 31.3 g/dL (ref 30.0–36.0)
MCV: 76.9 fL — ABNORMAL LOW (ref 78.0–100.0)
PLATELETS: 210 10*3/uL (ref 150–400)
RBC: 4.45 MIL/uL (ref 3.87–5.11)
RDW: 18.1 % — AB (ref 11.5–15.5)
WBC: 4.3 10*3/uL (ref 4.0–10.5)

## 2015-09-29 LAB — BASIC METABOLIC PANEL
Anion gap: 12 (ref 5–15)
BUN: 15 mg/dL (ref 6–20)
CO2: 21 mmol/L — ABNORMAL LOW (ref 22–32)
CREATININE: 1.23 mg/dL — AB (ref 0.44–1.00)
Calcium: 8.9 mg/dL (ref 8.9–10.3)
Chloride: 104 mmol/L (ref 101–111)
GFR, EST NON AFRICAN AMERICAN: 53 mL/min — AB (ref >60)
Glucose, Bld: 104 mg/dL — ABNORMAL HIGH (ref 65–99)
POTASSIUM: 3.8 mmol/L (ref 3.5–5.1)
SODIUM: 137 mmol/L (ref 135–145)

## 2015-09-29 LAB — LIPID PANEL
CHOLESTEROL: 211 mg/dL — AB (ref 0–200)
HDL: 32 mg/dL — ABNORMAL LOW (ref >40)
LDL Cholesterol: 117 mg/dL — ABNORMAL HIGH (ref 0–99)
TRIGLYCERIDES: 309 mg/dL — AB (ref <150)
Total CHOL/HDL Ratio: 6.6 RATIO
VLDL: 62 mg/dL — ABNORMAL HIGH (ref 0–40)

## 2015-09-29 LAB — SEDIMENTATION RATE: Sed Rate: 6 mm/hr (ref 0–22)

## 2015-09-29 LAB — TSH: TSH: 6.021 u[IU]/mL — AB (ref 0.350–4.500)

## 2015-09-29 LAB — HEPATIC FUNCTION PANEL
ALK PHOS: 53 U/L (ref 38–126)
ALT: 18 U/L (ref 14–54)
AST: 24 U/L (ref 15–41)
Albumin: 3.4 g/dL — ABNORMAL LOW (ref 3.5–5.0)
BILIRUBIN TOTAL: 0.5 mg/dL (ref 0.3–1.2)
Bilirubin, Direct: <0.1 mg/dL — ABNORMAL LOW (ref 0.1–0.5)
TOTAL PROTEIN: 6.1 g/dL — AB (ref 6.5–8.1)

## 2015-09-29 LAB — PROTIME-INR
INR: 0.97 (ref 0.00–1.49)
Prothrombin Time: 13.1 seconds (ref 11.6–15.2)

## 2015-09-29 LAB — T4, FREE: FREE T4: 1.08 ng/dL (ref 0.61–1.12)

## 2015-09-29 LAB — TROPONIN I: TROPONIN I: 0.11 ng/mL — AB (ref <0.031)

## 2015-09-29 SURGERY — LEFT HEART CATH AND CORONARY ANGIOGRAPHY

## 2015-09-29 MED ORDER — HEPARIN SODIUM (PORCINE) 1000 UNIT/ML IJ SOLN
INTRAMUSCULAR | Status: AC
  Filled 2015-09-29: qty 1

## 2015-09-29 MED ORDER — MIDAZOLAM HCL 2 MG/2ML IJ SOLN
INTRAMUSCULAR | Status: AC
  Filled 2015-09-29: qty 2

## 2015-09-29 MED ORDER — IOHEXOL 350 MG/ML SOLN
INTRAVENOUS | Status: DC | PRN
  Administered 2015-09-29: 40 mL via INTRACARDIAC

## 2015-09-29 MED ORDER — ACETAMINOPHEN 325 MG PO TABS: 650.0000 mg | ORAL_TABLET | ORAL | Status: DC | PRN

## 2015-09-29 MED ORDER — SODIUM CHLORIDE 0.9 % WEIGHT BASED INFUSION: 1.0000 mL/kg/h | INTRAVENOUS | Status: DC

## 2015-09-29 MED ORDER — SODIUM CHLORIDE 0.9 % IJ SOLN: 3.0000 mL | INTRAMUSCULAR | Status: DC | PRN

## 2015-09-29 MED ORDER — ONDANSETRON HCL 4 MG/2ML IJ SOLN: 4.0000 mg | Freq: Four times a day (QID) | INTRAMUSCULAR | Status: DC | PRN

## 2015-09-29 MED ORDER — VERAPAMIL HCL 2.5 MG/ML IV SOLN
INTRAVENOUS | Status: DC | PRN
  Administered 2015-09-29: 12:00:00 via INTRA_ARTERIAL

## 2015-09-29 MED ORDER — MIDAZOLAM HCL 2 MG/2ML IJ SOLN
INTRAMUSCULAR | Status: DC | PRN
  Administered 2015-09-29: 2 mg via INTRAVENOUS
  Administered 2015-09-29: 1 mg via INTRAVENOUS

## 2015-09-29 MED ORDER — HEPARIN SODIUM (PORCINE) 1000 UNIT/ML IJ SOLN
INTRAMUSCULAR | Status: DC | PRN
  Administered 2015-09-29: 4000 [IU] via INTRAVENOUS

## 2015-09-29 MED ORDER — SODIUM CHLORIDE 0.9 % WEIGHT BASED INFUSION
3.0000 mL/kg/h | INTRAVENOUS | Status: DC
  Administered 2015-09-29: 3 mL/kg/h via INTRAVENOUS

## 2015-09-29 MED ORDER — VERAPAMIL HCL 2.5 MG/ML IV SOLN
INTRAVENOUS | Status: AC
  Filled 2015-09-29: qty 2

## 2015-09-29 MED ORDER — SODIUM CHLORIDE 0.9 % IV SOLN: 250.0000 mL | INTRAVENOUS | Status: DC | PRN

## 2015-09-29 MED ORDER — HEPARIN (PORCINE) IN NACL 2-0.9 UNIT/ML-% IJ SOLN
INTRAMUSCULAR | Status: AC
  Filled 2015-09-29: qty 1000

## 2015-09-29 MED ORDER — SODIUM CHLORIDE 0.9 % WEIGHT BASED INFUSION: 3.0000 mL/kg/h | INTRAVENOUS | Status: DC

## 2015-09-29 MED ORDER — LIDOCAINE HCL (PF) 1 % IJ SOLN
INTRAMUSCULAR | Status: DC | PRN
  Administered 2015-09-29: 12:00:00

## 2015-09-29 MED ORDER — SODIUM CHLORIDE 0.9 % IJ SOLN
3.0000 mL | Freq: Two times a day (BID) | INTRAMUSCULAR | Status: DC
Start: 2015-09-29 — End: 2015-09-29

## 2015-09-29 MED ORDER — FENTANYL CITRATE (PF) 100 MCG/2ML IJ SOLN
INTRAMUSCULAR | Status: DC | PRN
  Administered 2015-09-29 (×2): 25 ug via INTRAVENOUS

## 2015-09-29 MED ORDER — NITROGLYCERIN 1 MG/10 ML FOR IR/CATH LAB
INTRA_ARTERIAL | Status: AC
  Filled 2015-09-29: qty 10

## 2015-09-29 MED ORDER — FENTANYL CITRATE (PF) 100 MCG/2ML IJ SOLN
INTRAMUSCULAR | Status: AC
  Filled 2015-09-29: qty 2

## 2015-09-29 MED ORDER — ASPIRIN 81 MG PO CHEW: 81.0000 mg | CHEWABLE_TABLET | ORAL | Status: DC

## 2015-09-29 MED ORDER — SODIUM CHLORIDE 0.9 % IJ SOLN: 3.0000 mL | Freq: Two times a day (BID) | INTRAMUSCULAR | Status: DC

## 2015-09-29 MED ORDER — FLUOXETINE HCL 20 MG PO CAPS
60.0000 mg | ORAL_CAPSULE | Freq: Every day | ORAL | Status: DC
  Administered 2015-09-29: 60 mg via ORAL
  Filled 2015-09-29: qty 3

## 2015-09-29 MED ORDER — LIDOCAINE HCL (PF) 1 % IJ SOLN
INTRAMUSCULAR | Status: AC
  Filled 2015-09-29: qty 30

## 2015-09-29 SURGICAL SUPPLY — 9 items
CATH INFINITI 5 FR JL3.5 (CATHETERS) ×3 IMPLANT
CATH INFINITI JR4 5F (CATHETERS) ×3 IMPLANT
DEVICE RAD COMP TR BAND LRG (VASCULAR PRODUCTS) ×3 IMPLANT
GLIDESHEATH SLEND SS 6F .021 (SHEATH) ×3 IMPLANT
KIT HEART LEFT (KITS) ×3 IMPLANT
PACK CARDIAC CATHETERIZATION (CUSTOM PROCEDURE TRAY) ×3 IMPLANT
TRANSDUCER W/STOPCOCK (MISCELLANEOUS) ×3 IMPLANT
TUBING CIL FLEX 10 FLL-RA (TUBING) ×3 IMPLANT
WIRE SAFE-T 1.5MM-J .035X260CM (WIRE) ×3 IMPLANT

## 2015-09-29 NOTE — Progress Notes (Signed)
SUBJECTIVE:  Patient with several episodes of chest discomfort last night last several minutes.  One episode woke her from sleep at 2 AM.  Currently pain free.  She also reports left arm pain, numbness and left hand numbness.  OBJECTIVE:   Vitals:   Filed Vitals:   09/28/15 2030 09/28/15 2100 09/28/15 2215 09/29/15 0459  BP: 113/89 111/71 118/77 94/59  Pulse: 83 88 88 80  Temp:   97.7 F (36.5 C) 98.4 F (36.9 C)  TempSrc:   Oral Oral  Resp: Height:    (1.626 m)   Weight:   169 lb 6.4 oz (76.839 kg) 163 lb 3.2 oz (74.027 kg)  SpO2: 98% 97% 100% 94%   I&O's:   Intake/Output Summary (Last 24 hours) at 09/29/15 0815 Last data filed at 09/29/15 0503  Gross per 24 hour  Intake      0 ml  Output    300 ml  Net   -300 ml   TELEMETRY: Reviewed telemetry pt in NSR:     PHYSICAL EXAM General: Well developed, well nourished, in no acute distress Head:   Normal cephalic and atramatic  Lungs:  Clear bilaterally to auscultation. Heart:  HRRR S1 S2  No JVD.   Abdomen: abdomen soft and non-tender Msk:  Back normal,  Normal strength and tone for age. Extremities:  No edema.  2+ right radial pulse Neuro: Alert and oriented. Psych:  Normal affect, responds appropriately Skin: No rash   LABS: Basic Metabolic Panel:  Recent Labs  60/45/40 1047 09/29/15 0506  NA 137 137  K 3.8 3.8  CL 106 104  CO2 21* 21*  GLUCOSE 117* 104*  BUN 8 15  CREATININE 1.00 1.23*  CALCIUM 9.3 8.9   Liver Function Tests:  Recent Labs  09/28/15 2343  AST 24  ALT 18  ALKPHOS 53  BILITOT 0.5  PROT 6.1*  ALBUMIN 3.4*   No results for input(s): LIPASE, AMYLASE in the last 72 hours. CBC:  Recent Labs  09/28/15 1047 09/29/15 0500  WBC 5.7 4.3  HGB 11.9* 10.7*  HCT 38.5 34.2*  MCV 75.9* 76.9*  PLT 238 210   Cardiac Enzymes:  Recent Labs  09/28/15 1255 09/28/15 1707 09/28/15 2343  TROPONINI 0.04* 0.07* 0.11*   BNP: Invalid input(s):  POCBNP D-Dimer:  Recent Labs  09/28/15 1255  DDIMER 1.25*   Hemoglobin A1C: No results for input(s): HGBA1C in the last 72 hours. Fasting Lipid Panel:  Recent Labs  09/29/15 0506  CHOL 211*  HDL 32*  LDLCALC 117*  TRIG 309*  CHOLHDL 6.6   Thyroid Function Tests:  Recent Labs  09/28/15 2343  TSH 6.021*   Anemia Panel: No results for input(s): VITAMINB12, FOLATE, FERRITIN, TIBC, IRON, RETICCTPCT in the last 72 hours. Coag Panel:   Lab Results  Component Value Date   INR 0.97 09/28/2015    RADIOLOGY: Dg Chest 2 View  09/28/2015  CLINICAL DATA:  Left-sided chest pain for 1 month worse in last night EXAM: CHEST  2 VIEW COMPARISON:  02/11/2015 FINDINGS: There is a linear band of airspace disease at the right costophrenic angle likely reflecting atelectasis versus scarring. There is no focal parenchymal opacity. There is no pleural effusion or pneumothorax. The heart and mediastinal contours are unremarkable. The osseous structures are unremarkable. IMPRESSION: No active cardiopulmonary disease. Electronically Signed   By: Elige Ko   On: 09/28/2015 12:13   Ct Angio Chest Pe W/cm &/or Wo  Cm  09/28/2015  CLINICAL DATA:  Shortness of breath, left chest pain, elevated D-dimer, dizziness EXAM: CT ANGIOGRAPHY CHEST WITH CONTRAST TECHNIQUE: Multidetector CT imaging of the chest was performed using the standard protocol during bolus administration of intravenous contrast. Multiplanar CT image reconstructions and MIPs were obtained to evaluate the vascular anatomy. CONTRAST:  80mL OMNIPAQUE IOHEXOL 350 MG/ML SOLN COMPARISON:  None. FINDINGS: No evidence of pulmonary embolism. Mediastinum/Nodes: Heart is normal in size. No pericardial effusion. No suspicious mediastinal, hilar, or axillary lymphadenopathy. Visualized thyroid is unremarkable. Lungs/Pleura: Mild patchy bilateral lower lobe opacities, likely atelectasis. No suspicious pulmonary nodules. Trace bilateral pleural effusions. No  pneumothorax. Upper abdomen: Visualized upper abdomen is within normal limits. Musculoskeletal: Visualized osseous structures are within normal limits. Review of the MIP images confirms the above findings. IMPRESSION: No evidence of pulmonary embolism. Mild patchy bilateral lower lobe opacities, likely atelectasis. Trace bilateral pleural effusions. Electronically Signed   By: Charline BillsSriyesh  Krishnan M.D.   On: 09/28/2015 16:49      ASSESSMENT: Roosvelt Harps/PLAN:    1) Unstable angina: Troponins low level, slight upward trend.  Recurrent pain.  Will plan on cardiac cath.  No Vgram due to slight increase in Cr and PE CT study yesterday.  Will hydrate aggressively.  Risks and benefits of cath explained to patient and she agrees.  No bleeding issues.  SHould be ok for DES.  Mildly increased TSH.  Would have her f/u with PCP.  She Needs to stop smoking.  Corky CraftsJayadeep S Bart Ashford, MD  09/29/2015  8:15 AM

## 2015-09-29 NOTE — Interval H&P Note (Signed)
Cath Lab Visit (complete for each Cath Lab visit)  Clinical Evaluation Leading to the Procedure:   ACS: Yes.    Non-ACS:    Anginal Classification: CCS IV  Anti-ischemic medical therapy: Minimal Therapy (1 class of medications)  Non-Invasive Test Results: No non-invasive testing performed  Prior CABG: No previous CABG   Recurrent pain early this AM that woke her from sleep.   History and Physical Interval Note:  09/29/2015 11:37 AM  Jody [name]  has presented today for surgery, with the diagnosis of positive enzymes  The various methods of treatment have been discussed with the patient and family. After consideration of risks, benefits and other options for treatment, the patient has consented to  Procedure(s): Left Heart Cath and Coronary Angiography (N/A) as a surgical intervention .  The patient's history has been reviewed, patient examined, no change in status, stable for surgery.  I have reviewed the patient's chart and labs.  Questions were answered to the patient's satisfaction.     Linas Stepter S.

## 2015-09-29 NOTE — Progress Notes (Signed)
Echocardiogram 2D Echocardiogram has been performed.  Jody [name], Jody [name] 09/29/2015, 12:59 PM

## 2015-09-29 NOTE — H&P (View-Only) (Signed)
 SUBJECTIVE:  Patient with several episodes of chest discomfort last night last several minutes.  One episode woke her from sleep at 2 AM.  Currently pain free.  She also reports left arm pain, numbness and left hand numbness.  OBJECTIVE:   Vitals:   Filed Vitals:   09/28/15 2030 09/28/15 2100 09/28/15 2215 09/29/15 0459  BP: 113/89 111/71 118/77 94/59  Pulse: 83 88 88 80  Temp:   97.7 F (36.5 C) 98.4 F (36.9 C)  TempSrc:   Oral Oral  Resp: 20 20 20 20  Height:   5' 4" (1.626 m)   Weight:   169 lb 6.4 oz (76.839 kg) 163 lb 3.2 oz (74.027 kg)  SpO2: 98% 97% 100% 94%   I&O's:   Intake/Output Summary (Last 24 hours) at 09/29/15 0815 Last data filed at 09/29/15 0503  Gross per 24 hour  Intake      0 ml  Output    300 ml  Net   -300 ml   TELEMETRY: Reviewed telemetry pt in NSR:     PHYSICAL EXAM General: Well developed, well nourished, in no acute distress Head:   Normal cephalic and atramatic  Lungs:  Clear bilaterally to auscultation. Heart:  HRRR S1 S2  No JVD.   Abdomen: abdomen soft and non-tender Msk:  Back normal,  Normal strength and tone for age. Extremities:  No edema.  2+ right radial pulse Neuro: Alert and oriented. Psych:  Normal affect, responds appropriately Skin: No rash   LABS: Basic Metabolic Panel:  Recent Labs  09/28/15 1047 09/29/15 0506  NA 137 137  K 3.8 3.8  CL 106 104  CO2 21* 21*  GLUCOSE 117* 104*  BUN 8 15  CREATININE 1.00 1.23*  CALCIUM 9.3 8.9   Liver Function Tests:  Recent Labs  09/28/15 2343  AST 24  ALT 18  ALKPHOS 53  BILITOT 0.5  PROT 6.1*  ALBUMIN 3.4*   No results for input(s): LIPASE, AMYLASE in the last 72 hours. CBC:  Recent Labs  09/28/15 1047 09/29/15 0500  WBC 5.7 4.3  HGB 11.9* 10.7*  HCT 38.5 34.2*  MCV 75.9* 76.9*  PLT 238 210   Cardiac Enzymes:  Recent Labs  09/28/15 1255 09/28/15 1707 09/28/15 2343  TROPONINI 0.04* 0.07* 0.11*   BNP: Invalid input(s):  POCBNP D-Dimer:  Recent Labs  09/28/15 1255  DDIMER 1.25*   Hemoglobin A1C: No results for input(s): HGBA1C in the last 72 hours. Fasting Lipid Panel:  Recent Labs  09/29/15 0506  CHOL 211*  HDL 32*  LDLCALC 117*  TRIG 309*  CHOLHDL 6.6   Thyroid Function Tests:  Recent Labs  09/28/15 2343  TSH 6.021*   Anemia Panel: No results for input(s): VITAMINB12, FOLATE, FERRITIN, TIBC, IRON, RETICCTPCT in the last 72 hours. Coag Panel:   Lab Results  Component Value Date   INR 0.97 09/28/2015    RADIOLOGY: Dg Chest 2 View  09/28/2015  CLINICAL DATA:  Left-sided chest pain for 1 month worse in last night EXAM: CHEST  2 VIEW COMPARISON:  02/11/2015 FINDINGS: There is a linear band of airspace disease at the right costophrenic angle likely reflecting atelectasis versus scarring. There is no focal parenchymal opacity. There is no pleural effusion or pneumothorax. The heart and mediastinal contours are unremarkable. The osseous structures are unremarkable. IMPRESSION: No active cardiopulmonary disease. Electronically Signed   By: Hetal  Patel   On: 09/28/2015 12:13   Ct Angio Chest Pe W/cm &/or Wo   Cm  09/28/2015  CLINICAL DATA:  Shortness of breath, left chest pain, elevated D-dimer, dizziness EXAM: CT ANGIOGRAPHY CHEST WITH CONTRAST TECHNIQUE: Multidetector CT imaging of the chest was performed using the standard protocol during bolus administration of intravenous contrast. Multiplanar CT image reconstructions and MIPs were obtained to evaluate the vascular anatomy. CONTRAST:  80mL OMNIPAQUE IOHEXOL 350 MG/ML SOLN COMPARISON:  None. FINDINGS: No evidence of pulmonary embolism. Mediastinum/Nodes: Heart is normal in size. No pericardial effusion. No suspicious mediastinal, hilar, or axillary lymphadenopathy. Visualized thyroid is unremarkable. Lungs/Pleura: Mild patchy bilateral lower lobe opacities, likely atelectasis. No suspicious pulmonary nodules. Trace bilateral pleural effusions. No  pneumothorax. Upper abdomen: Visualized upper abdomen is within normal limits. Musculoskeletal: Visualized osseous structures are within normal limits. Review of the MIP images confirms the above findings. IMPRESSION: No evidence of pulmonary embolism. Mild patchy bilateral lower lobe opacities, likely atelectasis. Trace bilateral pleural effusions. Electronically Signed   By: Charline BillsSriyesh  Krishnan M.D.   On: 09/28/2015 16:49      ASSESSMENT: Roosvelt Harps/PLAN:    1) Unstable angina: Troponins low level, slight upward trend.  Recurrent pain.  Will plan on cardiac cath.  No Vgram due to slight increase in Cr and PE CT study yesterday.  Will hydrate aggressively.  Risks and benefits of cath explained to patient and she agrees.  No bleeding issues.  SHould be ok for DES.  Mildly increased TSH.  Would have her f/u with PCP.  She Needs to stop smoking.  Corky CraftsJayadeep S Katryna Tschirhart, MD  09/29/2015  8:15 AM

## 2015-09-29 NOTE — Discharge Summary (Addendum)
Discharge Summary   Patient ID: Jody [name],  MRN: [id number], DOB/AGE: Feb 08, 1971 44 y.o.  Admit date: 09/28/2015 Discharge date: 09/29/2015  Primary Care Provider: Lorayne Marek Primary Cardiologist: Seen initially by Dr. Bronson Ing on night call, but lives locally thus will be Dr. Irish Lack.  Discharge Diagnoses    Principal Problem:   Pleuritic chest pain Active Problems:   Paranoid schizophrenia (HCC)   Elevated troponin   Microcytic anemia   Depression   Tobacco abuse   Abnormal TSH   Allergies Allergies  Allergen Reactions  . Penicillins Itching and Rash    Diagnostic Studies/Procedures    1) Cardiac catheterization this admission, please see full report and below for summary. 2) 2D Echo 09/29/15 - Left ventricle: The cavity size was normal. Systolic function was normal. The estimated ejection fraction was in the range of 60% to 65%. Wall motion was normal; there were no regional wall motion abnormalities.  _____________   History of Present Illness  Ms. Templin is a 44 y/o F with PMH significant for depression, paranoid schizophrenia (on Metformin for weight gain associated with psych meds), and tobacco abuse who was admitted with intermittent chest pain for several months.  Hospital Course   She said it actually began about 3 months ago but has been worse in the past four weeks, occurring once every other day. It had not been worse with exertion. It was describes as both "sharp and dull" and located in the left inframammary region, with radiation down the left arm with finger tingling/numbness. She also had associated dizziness and shortness of breath, but no syncope. She denied h/o significant GERD or premature h/o CAD in family. She denied any leg swelling, orthopnea, or PND. Troponins were minimally elevated (0.05-->0.04-->0.07) and d-dimer elevated (1.25). CXR normal. CT angiogram of chest showed no pulmonary embolism with trace b/l pleural  effusions and atelectasis. EKG showed sinus rhythm with artifact, QTc 488 ms, and no gross ischemic ST-T abnormalities. Her story was felt somewhat atypical and troponins were elevated in a flat trend only (up to 0.11). ESR was normal and LFTs were grossly unremarkable. UDS was negative along with upreg. For definitive evaluation she underwent LHC today showing no significant CAD, normal LVEDP. 2D Echo showed normal EF 60-65%, no RMWA, no significant abnormalities. The etiology of her chest pain and elevated troponin is unclear but does not appear to be of cardiac origin at this time. Dr. Irish Lack did not feel that she requires daily aspirin at this time. Dr. Irish Lack has seen and examined the patient today and feels he is stable for discharge. He recommends that when she is seen in follow-up, that she get a BMET given Cr 1.23 this AM - will defer to the provider that sees her that day to order in case additional labs are ordered. She was asked to resume Metformin on Friday afternoon (10/01/15). Tobacco cessation advised.  She was instructed to f/u PCP for the following: - mild microcytic anemia with Hgb 10.7. She denied any unusual bleeding. - TSH 6.021 with free T4 1.08  Consultants: N/a _____________  Discharge Vitals Blood pressure 135/99, pulse 75, temperature 98.4 F (36.9 C), temperature source Oral, resp. rate 19, height _0  (1.626 m), weight 163 lb 3.2 oz (74.027 kg), last menstrual period 09/21/2015, SpO2 100 %.  Filed Weights   09/28/15 1041 09/28/15 2215 09/29/15 0459  Weight: 168 lb 4.8 oz (76.34 kg) 169 lb 6.4 oz (76.839 kg) 163 lb 3.2 oz (74.027 kg)  _____________  Labs     CBC  Recent Labs  09/28/15 1047 09/29/15 0500  WBC 5.7 4.3  HGB 11.9* 10.7*  HCT 38.5 34.2*  MCV 75.9* 76.9*  PLT 238 628   Basic Metabolic Panel  Recent Labs  09/28/15 1047 09/29/15 0506  NA 137 137  K 3.8 3.8  CL 106 104  CO2 21* 21*  GLUCOSE 117* 104*  BUN 8 15  CREATININE 1.00  1.23*  CALCIUM 9.3 8.9   Liver Function Tests  Recent Labs  09/28/15 2343  AST 24  ALT 18  ALKPHOS 53  BILITOT 0.5  PROT 6.1*  ALBUMIN 3.4*   Cardiac Enzymes  Recent Labs  09/28/15 1255 09/28/15 1707 09/28/15 2343  TROPONINI 0.04* 0.07* 0.11*   BNP Invalid input(s): POCBNP D-Dimer  Recent Labs  09/28/15 1255  DDIMER 1.25*   Fasting Lipid Panel  Recent Labs  09/29/15 0506  CHOL 211*  HDL 32*  LDLCALC 117*  TRIG 309*  CHOLHDL 6.6   Thyroid Function Tests  Recent Labs  09/28/15 2343  TSH 6.021*   _____________  Disposition   Pt is being discharged home today in good condition. _____________  Follow-up Plans & Appointments    Follow-up Information    Follow up with Charlie Pitter, PA-C.   Specialties:  Cardiology, Radiology   Why:  [facility] - 10/06/15 at 10:30am   Contact information:   [address] [place] [address] [phone]       Follow up with Primary Care Doctor.   Why:  To evaluate for other non-heart causes of your chest pain.     Discharge Instructions    Diet - low sodium heart healthy    Complete by:  As directed      Increase activity slowly    Complete by:  As directed   No driving for 2 days. No lifting over 5 lbs for 1 week. No sexual activity for 1 week. Keep procedure site clean & dry. If you notice increased pain, swelling, bleeding or pus, call/return!  You may shower, but no soaking baths/hot tubs/pools for 1 week.  You should wait 48 hours after a heart catheterization before restarting your Metformin. You may restart your Metformin on Friday afternoon (10/01/15).  Follow up with your primary doctor for the following: - your blood count showed very mild anemia (hemoglobin level 10.7) - your thyroid function was mildly abnormal with TSH 6.021, but your free hormone level (free T4) was normal. This may need to be followed.           Discharge Medications   Current Discharge  Medication List    CONTINUE these medications which have NOT CHANGED   Details  benztropine (COGENTIN) 0.5 MG tablet Take 0.5 mg by mouth at bedtime.    busPIRone (BUSPAR) 5 MG tablet Take 1 tablet (5 mg total) by mouth 3 (three) times daily.     cyanocobalamin 500 MCG tablet Take 1 tablet (500 mcg total) by mouth daily.     FLUoxetine (PROZAC) 20 MG tablet Take 3 capsules by mouth daily    hydrOXYzine (ATARAX/VISTARIL) 25 MG tablet Take 1 tablet (25 mg total) by mouth 3 (three) times daily as needed for anxiety.     metFORMIN (GLUCOPHAGE) 500 MG tablet Take 1 tablet (500 mg total) by mouth daily with breakfast.     methylphenidate (RITALIN LA) 40 MG 24 hr capsule Take 40 mg by mouth 2 (two) times daily.  nicotine (NICODERM CQ - DOSED IN MG/24 HOURS) 21 mg/24hr patch Place 1 patch (21 mg total) onto the skin daily.     traZODone (DESYREL) 100 MG tablet Take 1 tablet (100 mg total) by mouth at bedtime as needed for sleep.     valACYclovir (VALTREX) 1000 MG tablet Take 1 tablet (1,000 mg total) by mouth 2 (two) times daily.         _____________   Kristeen Mans Labs/Studies   See above  Duration of Discharge Encounter   Greater than 30 minutes including physician time.  Signed, Melina Copa PA-C 09/29/2015, 2:40 PM  I have examined the patient and reviewed assessment and plan and discussed with patient.  Agree with above as stated.  No CAD by cath.  She needs risk factor modification and preventive therapy.  She needs to stop smoking.  She may not need any cardiology f/u unless further problems arise.  Would not start aspirin at this time for primary prevention due to the lack of CAD noted by angiogram, and potential bleeding risks.  Discharge after TR band is off.  F/u on TSH with PMD.  F/u BMet in one week.  Haani Bakula S.

## 2015-09-30 LAB — HEMOGLOBIN A1C
Hgb A1c MFr Bld: 5.7 % — ABNORMAL HIGH (ref 4.8–5.6)
Mean Plasma Glucose: 117 mg/dL

## 2015-10-05 NOTE — Progress Notes (Deleted)
No show

## 2015-10-06 ENCOUNTER — Encounter: Payer: Medicaid Other | Admitting: Physician Assistant

## 2015-10-06 NOTE — Progress Notes (Signed)
This encounter was created in error - please disregard.

## 2015-10-07 ENCOUNTER — Encounter: Payer: Self-pay | Admitting: Physician Assistant

## 2015-12-31 ENCOUNTER — Emergency Department (HOSPITAL_COMMUNITY)
Admission: EM | Admit: 2015-12-31 | Discharge: 2015-12-31 | Disposition: A | Discharge status: Home / Self Care | Payer: Medicaid Other

## 2015-12-31 NOTE — ED Notes (Signed)
Pt no answer x 2 

## 2015-12-31 NOTE — ED Notes (Signed)
Pt no call when called in waiting room to be triaged.

## 2016-01-01 ENCOUNTER — Encounter (HOSPITAL_COMMUNITY): Payer: Self-pay | Admitting: *Deleted

## 2016-01-01 ENCOUNTER — Emergency Department (HOSPITAL_COMMUNITY)
Admission: EM | Admit: 2016-01-01 | Discharge: 2016-01-01 | Disposition: A | Discharge status: Home / Self Care | Payer: Medicaid Other | Attending: Physician Assistant | Admitting: Physician Assistant

## 2016-01-01 DIAGNOSIS — F17210 Nicotine dependence, cigarettes, uncomplicated: Secondary | ICD-10-CM | POA: Insufficient documentation

## 2016-01-01 DIAGNOSIS — R350 Frequency of micturition: Secondary | ICD-10-CM | POA: Diagnosis present

## 2016-01-01 DIAGNOSIS — F410 Panic disorder [episodic paroxysmal anxiety] without agoraphobia: Secondary | ICD-10-CM | POA: Diagnosis not present

## 2016-01-01 DIAGNOSIS — F200 Paranoid schizophrenia: Secondary | ICD-10-CM | POA: Insufficient documentation

## 2016-01-01 DIAGNOSIS — Z862 Personal history of diseases of the blood and blood-forming organs and certain disorders involving the immune mechanism: Secondary | ICD-10-CM | POA: Insufficient documentation

## 2016-01-01 DIAGNOSIS — F329 Major depressive disorder, single episode, unspecified: Secondary | ICD-10-CM | POA: Diagnosis not present

## 2016-01-01 DIAGNOSIS — Z79899 Other long term (current) drug therapy: Secondary | ICD-10-CM | POA: Insufficient documentation

## 2016-01-01 DIAGNOSIS — Z7984 Long term (current) use of oral hypoglycemic drugs: Secondary | ICD-10-CM | POA: Diagnosis not present

## 2016-01-01 DIAGNOSIS — Z880 Allergy status to penicillin: Secondary | ICD-10-CM | POA: Diagnosis not present

## 2016-01-01 DIAGNOSIS — Z3202 Encounter for pregnancy test, result negative: Secondary | ICD-10-CM | POA: Insufficient documentation

## 2016-01-01 DIAGNOSIS — N3000 Acute cystitis without hematuria: Secondary | ICD-10-CM | POA: Diagnosis not present

## 2016-01-01 LAB — URINE MICROSCOPIC-ADD ON

## 2016-01-01 LAB — URINALYSIS, ROUTINE W REFLEX MICROSCOPIC
BILIRUBIN URINE: NEGATIVE
Glucose, UA: NEGATIVE mg/dL
KETONES UR: NEGATIVE mg/dL
NITRITE: NEGATIVE
Protein, ur: NEGATIVE mg/dL
SPECIFIC GRAVITY, URINE: 1.021 (ref 1.005–1.030)
pH: 6.5 (ref 5.0–8.0)

## 2016-01-01 LAB — POC URINE PREG, ED: PREG TEST UR: NEGATIVE

## 2016-01-01 MED ORDER — PHENAZOPYRIDINE HCL 200 MG PO TABS: 200.0000 mg | ORAL_TABLET | Freq: Three times a day (TID) | ORAL | Status: DC

## 2016-01-01 MED ORDER — NITROFURANTOIN MONOHYD MACRO 100 MG PO CAPS: 100.0000 mg | ORAL_CAPSULE | Freq: Two times a day (BID) | ORAL | Status: DC

## 2016-01-01 NOTE — ED Notes (Signed)
Pt thinks she is having a urinary symptoms of pain on initiation and ending of urination.  No vaginal discharge or bleeding.  No back pain. No abdominal pain

## 2016-01-01 NOTE — ED Provider Notes (Signed)
CSN: [id number]     Arrival date & time 01/01/16  1939 History   By signing my name below, I, Marisue Humble, attest that this documentation has been prepared under the direction and in the presence of non-physician practitioner, Rhea Bleacher PA-C. Electronically Signed: Marisue Humble, Scribe. 01/01/2016. 8:06 PM.   Chief Complaint  Patient presents with  . Urinary Frequency   The history is provided by the patient. No language interpreter was used.   HPI Comments:  Jody [name] is a 45 y.o. female with PMHx of interstitial cystitis who presents to the Emergency Department complaining of dysuria and intermittent frequency onset 2-3 days ago. She states pain is worse on initiation and cessation of urination. No treatments attempted PTA or alleviating factors noted. Pt reports h/o bladder infections and states her current symptoms feel similar; she has been successfully treated with antibiotics in the past. Pt denies fever, vomiting, back pain, vaginal discharge or vaginal bleeding.   Past Medical History  Diagnosis Date  . Cystitis, interstitial   . Anxiety and depression   . Panic attack   . Paranoid schizophrenia (HCC)   . Tobacco abuse   . Elevated troponin     a. 09/2015: CP/elevated troponin up to 0.11 - unclear significance. CT neg for PE, LHC neg for CAD, normal echo.  . Microcytic anemia     Noted on labs   Past Surgical History  Procedure Laterality Date  . Tubal ligation    . Cardiac catheterization N/A 09/29/2015    Procedure: Left Heart Cath and Coronary Angiography;  Surgeon: Corky Crafts, MD;  Location: [facility] INVASIVE CV LAB;  Service: Cardiovascular;  Laterality: N/A;   Family History  Problem Relation Age of Onset  . Diabetes Father   . Hypertension Mother   . Hypertension Sister   . Lupus Sister   . Heart disease Maternal Grandfather   . Lupus     Social History  Substance Use Topics  . Smoking status: Current Every Day Smoker -- 0.25 packs/day for  4 years    Types: Cigarettes    Last Attempt to Quit: 03/01/2015  . Smokeless tobacco: Never Used  . Alcohol Use: 0.0 oz/week    0 Standard drinks or equivalent per week   OB History    No data available     Review of Systems  Constitutional: Negative for fever.  Gastrointestinal: Negative for vomiting.  Genitourinary: Positive for dysuria and frequency. Negative for vaginal bleeding and vaginal discharge.  Musculoskeletal: Negative for back pain.    Allergies  Penicillins  Home Medications   Prior to Admission medications   Medication Sig Start Date End Date Taking? Authorizing Provider  benztropine (COGENTIN) 0.5 MG tablet Take 0.5 mg by mouth at bedtime.    Historical Provider, MD  busPIRone (BUSPAR) 5 MG tablet Take 1 tablet (5 mg total) by mouth 3 (three) times daily. 02/02/15   Thermon Leyland, NP  cyanocobalamin 500 MCG tablet Take 1 tablet (500 mcg total) by mouth daily. 02/02/15   Thermon Leyland, NP  FLUoxetine (PROZAC) 20 MG tablet Take 3 capsules by mouth daily    Historical Provider, MD  hydrOXYzine (ATARAX/VISTARIL) 25 MG tablet Take 1 tablet (25 mg total) by mouth 3 (three) times daily as needed for anxiety. 02/02/15   Thermon Leyland, NP  metFORMIN (GLUCOPHAGE) 500 MG tablet Take 1 tablet (500 mg total) by mouth daily with breakfast. 02/03/15   Thermon Leyland, NP  methylphenidate (RITALIN LA)  40 MG 24 hr capsule Take 40 mg by mouth 2 (two) times daily.    Historical Provider, MD  nicotine (NICODERM CQ - DOSED IN MG/24 HOURS) 21 mg/24hr patch Place 1 patch (21 mg total) onto the skin daily. 02/02/15   Thermon LeylandLaura A Davis, NP  traZODone (DESYREL) 100 MG tablet Take 1 tablet (100 mg total) by mouth at bedtime as needed for sleep. 02/02/15   Thermon LeylandLaura A Davis, NP  valACYclovir (VALTREX) 1000 MG tablet Take 1 tablet (1,000 mg total) by mouth 2 (two) times daily. 08/07/15   Tomasita CrumbleAdeleke Oni, MD   BP 132/105 mmHg  Pulse 69  Temp(Src) 98.6 F (37 C) (Oral)  Resp 14  Ht 5\' 4"  (1.626 m)  Wt 174 lb 5 oz  (79.068 kg)  BMI 29.91 kg/m2  SpO2 97%  LMP  Physical Exam  Constitutional: She appears well-developed and well-nourished.  HENT:  Head: Normocephalic and atraumatic.  Eyes: Conjunctivae are normal.  Neck: Normal range of motion. Neck supple.  Pulmonary/Chest: No respiratory distress.  Abdominal: Soft. Bowel sounds are normal. She exhibits no distension. There is no tenderness. There is no rebound.  Neurological: She is alert.  Skin: Skin is warm and dry.  Psychiatric: She has a normal mood and affect.  Nursing note and vitals reviewed.   ED Course  Procedures  DIAGNOSTIC STUDIES:  Oxygen Saturation is 97% on RA, normal by my interpretation.    COORDINATION OF CARE:  8:04 PM Will order UA and prescribe an antibiotic if infection is indicated. Discussed treatment plan with pt at bedside and pt agreed to plan.  Labs Review Labs Reviewed  URINALYSIS, ROUTINE W REFLEX MICROSCOPIC (NOT [facility]) - Abnormal; Notable for the following:    APPearance TURBID (*)    Hgb urine dipstick TRACE (*)    Leukocytes, UA LARGE (*)    All other components within normal limits  URINE MICROSCOPIC-ADD ON - Abnormal; Notable for the following:    Squamous Epithelial / LPF 6-30 (*)    Bacteria, UA MANY (*)    All other components within normal limits  POC URINE PREG, ED    9:36 PM patient updated on urine results. Will discharge to home with in about treatment. Encouraged PCP follow-up if not improved in 3 days.  MDM   Final diagnoses:  Acute cystitis without hematuria   Patient with history consistent with acute cystitis. UA confirms. No vaginal symptoms. No clinical signs of pyelonephritis.  I personally performed the services described in this documentation, which was scribed in my presence. The recorded information has been reviewed and is accurate.   Renne CriglerJoshua Javiel Canepa, PA-C 01/01/16 2137  Courteney Randall AnLyn Mackuen, MD 01/01/16 2357

## 2016-01-01 NOTE — Discharge Instructions (Signed)
Please read and follow all provided instructions.  Your diagnoses today include:  1. Acute cystitis without hematuria    Tests performed today include:  Urine test - suggests that you have an infection in your bladder  Vital signs. See below for your results today.   Medications prescribed:   Macrobid - antibiotic for urinary tract infection  You have been prescribed an antibiotic medicine: take the entire course of medicine even if you are feeling better. Stopping early can cause the antibiotic not to work.   Pyridium - medication for urinary tract infection symptoms.   This medication will turn your urine orange. This is normal.   Home care instructions:  Follow any educational materials contained in this packet.  Follow-up instructions: Please follow-up with your primary care provider in 3 days if symptoms are not resolved for further evaluation of your symptoms.  Return instructions:   Please return to the Emergency Department if you experience worsening symptoms.   Return with fever, worsening pain, persistent vomiting, worsening pain in your back.   Please return if you have any other emergent concerns.  Additional Information:  Your vital signs today were: BP 132/105 mmHg   Pulse 69   Temp(Src) 98.6 F (37 C) (Oral)   Resp 14   Ht 5\' 4"  (1.626 m)   Wt 79.068 kg   BMI 29.91 kg/m2   SpO2 97%   LMP  If your blood pressure (BP) was elevated above 135/85 this visit, please have this repeated by your doctor within one month. --------------

## 2016-01-01 NOTE — ED Notes (Signed)
Pt is in stable condition upon d/c and ambulates from ED. 

## 2016-04-26 ENCOUNTER — Encounter (HOSPITAL_COMMUNITY): Payer: Self-pay | Admitting: Emergency Medicine

## 2016-04-26 ENCOUNTER — Emergency Department (HOSPITAL_COMMUNITY)
Admission: EM | Admit: 2016-04-26 | Discharge: 2016-05-01 | Disposition: A | Discharge status: Home / Self Care | Payer: Medicaid Other | Attending: Emergency Medicine | Admitting: Emergency Medicine

## 2016-04-26 DIAGNOSIS — F418 Other specified anxiety disorders: Secondary | ICD-10-CM | POA: Diagnosis not present

## 2016-04-26 DIAGNOSIS — F209 Schizophrenia, unspecified: Secondary | ICD-10-CM | POA: Diagnosis not present

## 2016-04-26 DIAGNOSIS — Z79899 Other long term (current) drug therapy: Secondary | ICD-10-CM | POA: Insufficient documentation

## 2016-04-26 DIAGNOSIS — F17210 Nicotine dependence, cigarettes, uncomplicated: Secondary | ICD-10-CM | POA: Insufficient documentation

## 2016-04-26 DIAGNOSIS — F200 Paranoid schizophrenia: Secondary | ICD-10-CM | POA: Diagnosis present

## 2016-04-26 DIAGNOSIS — F29 Unspecified psychosis not due to a substance or known physiological condition: Secondary | ICD-10-CM | POA: Diagnosis not present

## 2016-04-26 DIAGNOSIS — R410 Disorientation, unspecified: Secondary | ICD-10-CM | POA: Diagnosis present

## 2016-04-26 LAB — CBC WITH DIFFERENTIAL/PLATELET
Basophils Absolute: 0 10*3/uL (ref 0.0–0.1)
Basophils Relative: 0 %
Eosinophils Absolute: 0.1 10*3/uL (ref 0.0–0.7)
Eosinophils Relative: 2 %
HCT: 38.6 % (ref 36.0–46.0)
Hemoglobin: 12.2 g/dL (ref 12.0–15.0)
Lymphocytes Relative: 42 %
Lymphs Abs: 2.2 10*3/uL (ref 0.7–4.0)
MCH: 24.6 pg — ABNORMAL LOW (ref 26.0–34.0)
MCHC: 31.6 g/dL (ref 30.0–36.0)
MCV: 77.8 fL — ABNORMAL LOW (ref 78.0–100.0)
Monocytes Absolute: 0.3 10*3/uL (ref 0.1–1.0)
Monocytes Relative: 5 %
Neutro Abs: 2.7 10*3/uL (ref 1.7–7.7)
Neutrophils Relative %: 51 %
Platelets: 218 10*3/uL (ref 150–400)
RBC: 4.96 MIL/uL (ref 3.87–5.11)
RDW: 15.7 % — ABNORMAL HIGH (ref 11.5–15.5)
WBC: 5.3 10*3/uL (ref 4.0–10.5)

## 2016-04-26 LAB — ACETAMINOPHEN LEVEL: Acetaminophen (Tylenol), Serum: <10 ug/mL — ABNORMAL LOW (ref 10–30)

## 2016-04-26 LAB — VALPROIC ACID LEVEL
Valproic Acid Lvl: 136 ug/mL — ABNORMAL HIGH (ref 50.0–100.0)
Valproic Acid Lvl: 118 ug/mL — ABNORMAL HIGH (ref 50.0–100.0)

## 2016-04-26 LAB — COMPREHENSIVE METABOLIC PANEL
ALBUMIN: 3.9 g/dL (ref 3.5–5.0)
ALK PHOS: 46 U/L (ref 38–126)
ALT: 7 U/L — AB (ref 14–54)
AST: 16 U/L (ref 15–41)
Anion gap: 7 (ref 5–15)
BUN: 7 mg/dL (ref 6–20)
CALCIUM: 9 mg/dL (ref 8.9–10.3)
CO2: 23 mmol/L (ref 22–32)
CREATININE: 1.15 mg/dL — AB (ref 0.44–1.00)
Chloride: 107 mmol/L (ref 101–111)
GFR calc Af Amer: >60 mL/min (ref >60)
GFR calc non Af Amer: 57 mL/min — ABNORMAL LOW (ref >60)
GLUCOSE: 78 mg/dL (ref 65–99)
Potassium: 4.1 mmol/L (ref 3.5–5.1)
SODIUM: 137 mmol/L (ref 135–145)
Total Bilirubin: 0.4 mg/dL (ref 0.3–1.2)
Total Protein: 7 g/dL (ref 6.5–8.1)

## 2016-04-26 LAB — ETHANOL

## 2016-04-26 LAB — AMMONIA: Ammonia: 15 umol/L (ref 9–35)

## 2016-04-26 NOTE — ED Triage Notes (Signed)
Mother stated, she had a urinary infection the week of July 11 and at that time was unable to hold her water.  She did get better from this.

## 2016-04-26 NOTE — ED Notes (Signed)
Family is reporting that patient is unable to complete sentences, but when RN asked patient questions, patient completed sentences and answered questions appropriately. Patient drowsy. Did not answer question as to birth date, but correctly answered her name. Spoke of wanting a [employer]. Spoke in full sentences to RN.

## 2016-04-26 NOTE — ED Notes (Signed)
Called Behavioral Health. Informed there is only one counselor performing assessments & there are 2 patients waiting for assessments ahead of this patient. Will notify PA.

## 2016-04-26 NOTE — ED Notes (Signed)
Pt could not use the restroom. Stated she will attempt later.
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
## 2016-04-26 NOTE — ED Triage Notes (Signed)
Daughter stated, she is losing her memory pretty quickly. No she can't complete sentences. DrBakkn  From [facility] and wanted her to be medically evaluated.

## 2016-04-26 NOTE — ED Triage Notes (Signed)
DR. Daisy Blossom [phone], from [facility] if need to call.

## 2016-04-27 ENCOUNTER — Encounter (HOSPITAL_COMMUNITY): Payer: Self-pay | Admitting: Behavioral Health

## 2016-04-27 ENCOUNTER — Emergency Department (HOSPITAL_COMMUNITY): Payer: Medicaid Other

## 2016-04-27 LAB — URINALYSIS, ROUTINE W REFLEX MICROSCOPIC
Bilirubin Urine: NEGATIVE
GLUCOSE, UA: NEGATIVE mg/dL
Hgb urine dipstick: NEGATIVE
Ketones, ur: NEGATIVE mg/dL
Nitrite: NEGATIVE
PH: 6 (ref 5.0–8.0)
PROTEIN: NEGATIVE mg/dL
Specific Gravity, Urine: 1.011 (ref 1.005–1.030)

## 2016-04-27 LAB — URINE MICROSCOPIC-ADD ON

## 2016-04-27 LAB — RAPID URINE DRUG SCREEN, HOSP PERFORMED
Amphetamines: NOT DETECTED
Barbiturates: NOT DETECTED
Benzodiazepines: NOT DETECTED
COCAINE: NOT DETECTED
OPIATES: NOT DETECTED
Tetrahydrocannabinol: NOT DETECTED

## 2016-04-27 MED ORDER — STERILE WATER FOR INJECTION IJ SOLN
INTRAMUSCULAR | Status: AC
  Administered 2016-04-27: 10 mL
  Filled 2016-04-27: qty 10

## 2016-04-27 MED ORDER — LORAZEPAM 1 MG PO TABS
1.0000 mg | ORAL_TABLET | Freq: Three times a day (TID) | ORAL | Status: DC | PRN
  Administered 2016-04-28 – 2016-04-30 (×2): 1 mg via ORAL
  Filled 2016-04-27 (×2): qty 1

## 2016-04-27 MED ORDER — DIVALPROEX SODIUM 250 MG PO DR TAB
1500.0000 mg | DELAYED_RELEASE_TABLET | Freq: Every day | ORAL | Status: DC
  Administered 2016-04-27 – 2016-04-30 (×4): 1500 mg via ORAL
  Filled 2016-04-27 (×4): qty 6

## 2016-04-27 MED ORDER — ZOLPIDEM TARTRATE 5 MG PO TABS: 5.0000 mg | ORAL_TABLET | Freq: Every evening | ORAL | Status: DC | PRN

## 2016-04-27 MED ORDER — BUSPIRONE HCL 10 MG PO TABS
5.0000 mg | ORAL_TABLET | Freq: Three times a day (TID) | ORAL | Status: DC
  Administered 2016-04-27 – 2016-05-01 (×13): 5 mg via ORAL
  Filled 2016-04-27 (×8): qty 1
  Filled 2016-04-27: qty 0.5
  Filled 2016-04-27 (×4): qty 1

## 2016-04-27 MED ORDER — ACETAMINOPHEN 325 MG PO TABS
650.0000 mg | ORAL_TABLET | ORAL | Status: DC | PRN
  Administered 2016-04-27: 650 mg via ORAL
  Filled 2016-04-27: qty 2

## 2016-04-27 MED ORDER — IBUPROFEN 200 MG PO TABS: 600.0000 mg | ORAL_TABLET | Freq: Three times a day (TID) | ORAL | Status: DC | PRN

## 2016-04-27 MED ORDER — ONDANSETRON HCL 4 MG PO TABS: 4.0000 mg | ORAL_TABLET | Freq: Three times a day (TID) | ORAL | Status: DC | PRN

## 2016-04-27 MED ORDER — ZIPRASIDONE MESYLATE 20 MG IM SOLR
20.0000 mg | Freq: Once | INTRAMUSCULAR | Status: AC
  Administered 2016-04-27: 20 mg via INTRAMUSCULAR
  Filled 2016-04-27: qty 20

## 2016-04-27 MED ORDER — NICOTINE 21 MG/24HR TD PT24: 21.0000 mg | MEDICATED_PATCH | Freq: Every day | TRANSDERMAL | Status: DC

## 2016-04-27 MED ORDER — BENZTROPINE MESYLATE 1 MG PO TABS
1.0000 mg | ORAL_TABLET | Freq: Two times a day (BID) | ORAL | Status: DC
  Administered 2016-04-27 – 2016-05-01 (×9): 1 mg via ORAL
  Filled 2016-04-27 (×9): qty 1

## 2016-04-27 MED ORDER — OLANZAPINE 5 MG PO TBDP
5.0000 mg | ORAL_TABLET | Freq: Two times a day (BID) | ORAL | Status: DC
  Administered 2016-04-27 – 2016-05-01 (×8): 5 mg via ORAL
  Filled 2016-04-27 (×8): qty 1

## 2016-04-27 MED ORDER — ALUM & MAG HYDROXIDE-SIMETH 200-200-20 MG/5ML PO SUSP: 30.0000 mL | ORAL | Status: DC | PRN

## 2016-04-27 MED ORDER — HALOPERIDOL 5 MG PO TABS: 5.0000 mg | ORAL_TABLET | Freq: Three times a day (TID) | ORAL | Status: DC | PRN

## 2016-04-27 MED ORDER — FLUOXETINE HCL 20 MG PO CAPS
60.0000 mg | ORAL_CAPSULE | Freq: Every day | ORAL | Status: DC
  Administered 2016-04-27: 60 mg via ORAL
  Filled 2016-04-27: qty 3

## 2016-04-27 MED ORDER — HALOPERIDOL LACTATE 5 MG/ML IJ SOLN: 5.0000 mg | Freq: Three times a day (TID) | INTRAMUSCULAR | Status: DC | PRN

## 2016-04-27 MED ORDER — NICOTINE 21 MG/24HR TD PT24
21.0000 mg | MEDICATED_PATCH | Freq: Every day | TRANSDERMAL | Status: DC
  Administered 2016-04-28 – 2016-05-01 (×4): 21 mg via TRANSDERMAL
  Filled 2016-04-27 (×4): qty 1

## 2016-04-27 NOTE — ED Notes (Signed)
Pt becoming verbally aggressive and attempting to flee from department. Sitter at bedside. Security at bedside. MD Delo made aware and advises to give another dose of 20 mg IM Geodon.

## 2016-04-27 NOTE — Progress Notes (Addendum)
Seeking inpatient psychiatric treatment for pt at recommendation of [name]. Referred to: [facility]- per [facility]- per Jonny Ruiz [facility]- per [facility] (no beds currently but accepting referrals in case of d/cs later)  Left voicemails for [facility], and [facility] and will refer if there is availability.  Declined: [facility]- due to "too acute for our unit"  Ilean Skill, MSW, LCSW Clinical Social Work, Disposition  04/27/2016 [phone]  15:50- [facility] called back to say they are at capacity today but advise call back 7/28 as there are expected d/cs then.  [facility]- intake RN still going through referrals at time of call, advised still unsure if able to take more referrals/outside admission pts today but again advised can call back next shift.  Lauro Regulus advised fax referral

## 2016-04-27 NOTE — ED Notes (Signed)
Faxed IVC paperwork 

## 2016-04-27 NOTE — ED Notes (Signed)
This RN re-checked patient vitals. Pt SpO2 at this time is 100% on RA.

## 2016-04-27 NOTE — ED Provider Notes (Signed)
Pt resting comfortably. Pt is awaiting [name] consult.  RN to call [name] to make sure pt will be assessed soon.  Vitals stable   Jody [name] [place], [place] 04/27/16 4503    Tomasita Crumble, MD 04/27/16 [phone]

## 2016-04-27 NOTE — ED Notes (Signed)
Pt voluntarily allowed this RN to perform IM injection of Geodon. No restraints needed. Pt calm and cooperative at this time. Pt belongings taken to nurses station. Security at bedside. Sitter at bedside.

## 2016-04-27 NOTE — ED Notes (Signed)
Patient had to use restroom. Flushed toilet. Stated that no urine came out for urine sample. Will reattempt collection later

## 2016-04-27 NOTE — ED Notes (Signed)
Gave pt Coke Cola, per [place] - PA.

## 2016-04-27 NOTE — ED Notes (Signed)
[name] notified this RN they are ready for consult at this time. When taking the [name] Cart into patient room, pt found standing in doorway with both breasts exposed. When politely asked the patient to cover her chest, pt states "oh no we ain't doin this." pt getting verbally aggressive. This RN removed self from patient room and notified security to come to bedside. PA Ebbie Ridge and MD Criss Alvine made aware of patient situation. Given order for Geodon IM at this time.
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
## 2016-04-27 NOTE — ED Notes (Signed)
Pt's daughter Janelle Floor picked up pt's tan colored bag and white colored sneakers. Informed Holly - RN.

## 2016-04-27 NOTE — ED Provider Notes (Addendum)
Patient appears acutely psychotic. Walking around room naked and does not appear aware of the situation. Walked to the heating pack when trying to use the phone. Per mom's description appears to be psychotic over past few days. [name] seen and agreed. She wants to leave but does not appear to have capacity. Will IVC and give geodon. UA with squamous cells and some mild leukocytes, will recheck to make sure UTI is not contributing as this first sample is contaminated   Pricilla Loveless, MD 04/27/16 6568    Pricilla Loveless, MD 04/27/16 [phone]

## 2016-04-27 NOTE — ED Notes (Signed)
Pt voluntarily let this RN give IM injection of Geodon in left thigh. Calm and cooperative at this time. Sitter at bedside.

## 2016-04-27 NOTE — ED Notes (Signed)
Pt attempted to get out of bed. Pt stated "my mom needs some bread". Pt was told that if her mom were to visit her, she would come to the hospital to visit her. Pt was asked to get back in bed. Pt complied.

## 2016-04-27 NOTE — ED Notes (Signed)
Pt remains alert. Watching television calmly.

## 2016-04-27 NOTE — Progress Notes (Signed)
Medication recommendations were made under MD supervision. Provider D/C Prozac, Ambien (due to starting Zyprexa), and haldol. Will add Zyprexa Zidis 5mg  po BID to start tonight. Recent Depakote level obtained 04/26/2016 @2003 , will need to repeat Depakote level in the AM once patient is stable. No adjustments will be made to Depakote at this time. Writer called and spoke with [place], Consulting civil engineer to advise of new recommendations. Will notify EDP ([place]) who is unavailable at this time. [place] reports patient recently attempted to flee the department, requiring another injection of geodon. Will continue to montior. CT scan has been ordered.

## 2016-04-28 NOTE — ED Notes (Signed)
Pt requesting anxiety medication; pt appears agitated, stating "I have things to do and place I could be other than here." Pt given ativan per prn order. Pt cooperative. Sitter at bedside

## 2016-04-28 NOTE — ED Notes (Signed)
Attempted to obtain vital signs, Pt refused.

## 2016-04-28 NOTE — ED Notes (Signed)
Breakfast ordered, pt cleaned up and changed scrubs , very calm and cooperative , sitter at bedside

## 2016-04-28 NOTE — Progress Notes (Signed)
CSW spoke with [facility] at [facility]. Patient continues to need inpatient criteria at this time. Placement being sought. Patient has been referred to: [facility] [address] [place] [facility] ED/65M Clinical Social Worker [phone]

## 2016-04-29 ENCOUNTER — Encounter (HOSPITAL_COMMUNITY): Payer: Self-pay

## 2016-04-29 LAB — CBG MONITORING, ED: GLUCOSE-CAPILLARY: 79 mg/dL (ref 65–99)

## 2016-04-29 NOTE — ED Notes (Signed)
Pt. Was feeling shaky, CBG done is 70.  Pt. Is eating her lunch at the present time. Reported to Dr. Moody Bruins.

## 2016-04-29 NOTE — ED Notes (Signed)
Breakfast tray ordered 

## 2016-04-29 NOTE — ED Notes (Signed)
Pt arrived to [facility] via bed w/sitter. Pt has eyeglasses at bedside and is wearing a ring on left 4th finger and bracelet to left wrist. No belongings bag came w/pt.

## 2016-04-29 NOTE — ED Notes (Signed)
Pt. oob to the bathroom, gait is unsteady. Assist of 2 needed.

## 2016-04-29 NOTE — ED Notes (Signed)
Pt at nurses' desk on phone - states is speaking w/her mother.

## 2016-04-29 NOTE — ED Notes (Signed)
Patient denies pain and is resting comfortably.  

## 2016-04-29 NOTE — ED Notes (Signed)
IVC papers placed on clipboard w/pt's family members' names and phone numbers.

## 2016-04-30 NOTE — ED Notes (Addendum)
Pt's sister and another female visited pt and now pt's 2 daughters visiting. Pt states it makes her feel better to have her family visit. RN has offered for to shower x 3 today. Pt continues to decline even after much encouragement.

## 2016-04-30 NOTE — ED Notes (Signed)
Pt on phone at nurses' desk. 

## 2016-04-30 NOTE — ED Notes (Signed)
Pt's daughter Janelle Floor requests to be called when placement is found for pt - [phone] - pt requests same d/t she may forget to call her. Purvis Sheffield she may bring pt menstrual pads - unopened - tomorrow (05/01/16) d/t states pt's menstrual period flow is very heavy and pads provided in ED are not thick enough.

## 2016-04-30 NOTE — ED Notes (Signed)
Pt talking w/boyfriend on phone. Pt noted to be tearful - asking RN if she may leave. Advised her not at this time. Pt advised boyfriend she is to stay in hospital d/t "I need more rest". Pt returned to room. Sitter w/pt.

## 2016-04-30 NOTE — ED Notes (Signed)
Pt's daughters aware of [facility] policies for pt and pamphlet given.

## 2016-04-30 NOTE — Progress Notes (Signed)
Disposition CSW completed additional patient referrals to the following inpatient psych facilities:  [facility] [place] [facility]  CSW will continue to follow patient as needed.  [facility] Disposition CSW [phone]

## 2016-04-30 NOTE — ED Notes (Signed)
Pt noted w/eyeglasses on bedside table, ring on finger and bracelet on left wrist.

## 2016-05-01 DIAGNOSIS — F200 Paranoid schizophrenia: Secondary | ICD-10-CM | POA: Diagnosis not present

## 2016-05-01 MED ORDER — OLANZAPINE 5 MG PO TABS: 5.0000 mg | ORAL_TABLET | Freq: Two times a day (BID) | ORAL | 0 refills | Status: DC

## 2016-05-01 NOTE — Progress Notes (Signed)
CSW contacted [facility] at [facility] to inquire about having Patient reassessed as she reports "feeling much better". Per Steffanie Rainwater at [facility], she will see if Patient can be reassessed.      Lance Muss, LCSW [facility] ED/87M Clinical Social Worker [phone]

## 2016-05-01 NOTE — ED Notes (Signed)
Patient states her mother can pick her up, [place].  Called [phone].  No answer.  Will continue to try reach patient's contact for pickup.

## 2016-05-01 NOTE — ED Provider Notes (Signed)
Received care patient at 9 AM on July 31. Briefly does the 45 year old pain female who had presented with acute psychosis with history of schizophrenia, anxiety and depression. Patient initially psychotic, and received Geodon and was IV seed in the emergency department,  However an evaluation with psychiatry today, and on my evaluation, she is appropriate. She is displaying normal behavior, thought process,denies suicidal or homicidal ideation.    Per psychiatry she is appropriate for outpatient treatment. Patient states that she has an appointment with her doctor at [facility] on Wednesday and displays clear thinking with a plan. Patient was given prescription for Zyprexa and discharged in stable condition.   Alvira Monday, MD 05/02/16 0010

## 2016-05-01 NOTE — Consult Note (Signed)
Telepsych Consultation   Reason for Consult: Psychosis  Referring Physician: EDP Patient Identification: TANAY MASSIAH MRN:  [id number] Principal Diagnosis: Paranoid schizophrenia [facility]) Diagnosis:   Patient Active Problem List   Diagnosis Date Noted  . Microcytic anemia [D50.9] 09/29/2015  . Depression [F32.9] 09/29/2015  . Tobacco abuse [Z72.0] 09/29/2015  . Abnormal TSH [R79.89] 09/29/2015  . Elevated troponin [R79.89]   . Pleuritic chest pain [R07.81] 09/28/2015  . Memory changes [R41.3] 03/12/2015  . Hallucinations [R44.3] 03/12/2015  . New onset of headaches [R51] 03/12/2015  . Paranoid schizophrenia (HCC) [F20.0] 03/12/2015  . MDD (major depressive disorder), recurrent, severe, with psychosis (HCC) [F33.3] 02/01/2015    Total Time spent with patient: 30 minutes  Subjective:   Jody [name] is a 45 y.o. female patient admitted with hallucinations and confusion.   HPI:    Jody [name] is a 45 year old female with past medical history of schizophrenia, anxiety and depression who presents to the [facility] for medical clearance. Patient's mother reported that on 7/14 when they were in [place] visiting family the patient began to have changes in behavior. She previously reported patient became confused, had intermittent episodes of being nonverbal and appeared to be a per stimulated when she reported that she was having auditory hallucinations. Mother also reports that patient began having intermittent episodes of urinary incontinence, right sided weakness and difficulty ambulating. The mother reported patient was evaluated in the ED in [place], was diagnosed with UTI and reported having negative stroke workup including labs and CT head and MRI (mother with paper copies of results in the ED). The mother reported the her behavior has improved since stopping the medication but note that she continued to have hallucinations, confusion and difficulty concentrating. Her mother  reported they went to [facility] and were advised to come to the ED for further evaluation.   Today on 05/01/2016 patient is calm and cooperative during assessment. She does not appear to be responding to internal stimuli. Patient states "I feel less confused. I got some good rest. I do not want to hurt myself. I feel more stable. I can follow up with [facility] later this week. My auditory and visual hallucinations are better. They are not really bothering me. I have someone who can pick me up. I will need prescriptions for my medications."   Past Psychiatric History: Schizophrenia   Risk to Self: Suicidal Ideation: No Suicidal Intent: No Is patient at risk for suicide?: No Suicidal Plan?: No Access to Means: No What has been your use of drugs/alcohol within the last 12 months?: unknown How many times?: 0 Other Self Harm Risks: unknown Triggers for Past Attempts: Unknown Intentional Self Injurious Behavior: None Risk to Others: Homicidal Ideation: No Thoughts of Harm to Others: No Current Homicidal Intent: No Current Homicidal Plan: No Access to Homicidal Means: No Identified Victim: NA History of harm to others?: No Assessment of Violence: None Noted Violent Behavior Description: NA Does patient have access to weapons?: No Criminal Charges Pending?: No Does patient have a court date: No Prior Inpatient Therapy: Prior Inpatient Therapy: Yes Prior Therapy Dates: 2015-2016 Prior Therapy Facilty/Provider([facility]): [facility] Reason for Treatment: Schizophrenia Prior Outpatient Therapy: Prior Outpatient Therapy: Yes Prior Therapy Dates: unknown Prior Therapy Facilty/Provider(s): [facility] Reason for Treatment: Schizophrenia Does patient have an ACCT team?: No Does patient have Intensive In-House Services?  : No Does patient have [facility] services? : No Does patient have P4CC services?: No  Past Medical History:  Past Medical History:  Diagnosis Date  .  Anxiety and depression   . Cystitis,  interstitial   . Elevated troponin    a. 09/2015: CP/elevated troponin up to 0.11 - unclear significance. CT neg for PE, LHC neg for CAD, normal echo.  . Microcytic anemia    Noted on labs  . Panic attack   . Paranoid schizophrenia (HCC)   . Tobacco abuse     Past Surgical History:  Procedure Laterality Date  . CARDIAC CATHETERIZATION N/A 09/29/2015   Procedure: Left Heart Cath and Coronary Angiography;  Surgeon: Corky Crafts, MD;  [facility]: [facility] INVASIVE CV LAB;  Service: Cardiovascular;  Laterality: N/A;  . TUBAL LIGATION     Family History:  Family History  Problem Relation Age of Onset  . Diabetes Father   . Hypertension Mother   . Hypertension Sister   . Lupus Sister   . Heart disease Maternal Grandfather   . Lupus     Family Psychiatric  History: Denies Social History:  History  Alcohol Use  . 0.0 oz/week     History  Drug Use No    Social History   Social History  . Marital status: Single    Spouse name: N/A  . Number of children: 5  . Years of education: N/A   Social History Main Topics  . Smoking status: Current Every Day Smoker    Packs/day: 0.25    Years: 4.00    Types: Cigarettes    Last attempt to quit: 03/01/2015  . Smokeless tobacco: Never Used  . Alcohol use 0.0 oz/week  . Drug use: No  . Sexual activity: Yes    Birth control/ protection: Condom   Other Topics Concern  . None   Social History Narrative  . None   Additional Social History:    Allergies:   Allergies  Allergen Reactions  . Penicillins Itching and Rash    Labs: No results found for this or any previous visit (from the past 48 hour(s)).  Current Facility-Administered Medications  Medication Dose Route Frequency Provider Last Rate Last Dose  . acetaminophen (TYLENOL) tablet 650 mg  650 mg Oral Q4H PRN Mercedes Camprubi-Soms, PA-C   650 mg at 04/27/16 2216  . alum & mag hydroxide-simeth (MAALOX/MYLANTA) 200-200-20 MG/5ML suspension 30 mL  30 mL Oral PRN Mercedes  Camprubi-Soms, PA-C      . benztropine (COGENTIN) tablet 1 mg  1 mg Oral BID Truman Hayward, FNP   1 mg at 05/01/16 0950  . busPIRone (BUSPAR) tablet 5 mg  5 mg Oral TID Truman Hayward, FNP   5 mg at 05/01/16 0950  . divalproex (DEPAKOTE) DR tablet 1,500 mg  1,500 mg Oral QHS Truman Hayward, FNP   1,500 mg at 04/30/16 2130  . ibuprofen (ADVIL,MOTRIN) tablet 600 mg  600 mg Oral Q8H PRN Mercedes Camprubi-Soms, PA-C      . LORazepam (ATIVAN) tablet 1 mg  1 mg Oral Q8H PRN Mercedes Camprubi-Soms, PA-C   1 mg at 04/30/16 2129  . nicotine (NICODERM CQ - dosed in mg/24 hours) patch 21 mg  21 mg Transdermal Daily Mercedes Camprubi-Soms, PA-C   21 mg at 05/01/16 0952  . OLANZapine zydis (ZYPREXA) disintegrating tablet 5 mg  5 mg Oral BID Truman Hayward, FNP   5 mg at 05/01/16 0951  . ondansetron (ZOFRAN) tablet 4 mg  4 mg Oral Q8H PRN Mercedes Camprubi-Soms, PA-C       Current Outpatient Prescriptions  Medication Sig Dispense Refill  . busPIRone (BUSPAR) 5  MG tablet Take 1 tablet (5 mg total) by mouth 3 (three) times daily. 90 tablet 0  . divalproex (DEPAKOTE) 500 MG DR tablet Take 1,500 mg by mouth at bedtime.    Marland Kitchen FLUoxetine (PROZAC) 20 MG tablet Take 60 mg by mouth daily. Take 3 capsules by mouth daily     . metFORMIN (GLUCOPHAGE) 500 MG tablet Take 1 tablet (500 mg total) by mouth daily with breakfast. 30 tablet 0  . methylphenidate (RITALIN LA) 40 MG 24 hr capsule Take 40 mg by mouth every morning.     . traZODone (DESYREL) 100 MG tablet Take 1 tablet (100 mg total) by mouth at bedtime as needed for sleep. (Patient taking differently: Take 100 mg by mouth at bedtime. ) 30 tablet 0  . ziprasidone (GEODON) 80 MG capsule Take 80 mg by mouth 2 (two) times daily with a meal.     . benztropine (COGENTIN) 1 MG tablet Take 1 mg by mouth 2 (two) times daily.    . cyanocobalamin 500 MCG tablet Take 1 tablet (500 mcg total) by mouth daily. (Patient not taking: Reported on 04/26/2016) 30 tablet 0  .  hydrOXYzine (ATARAX/VISTARIL) 25 MG tablet Take 1 tablet (25 mg total) by mouth 3 (three) times daily as needed for anxiety. (Patient not taking: Reported on 04/26/2016) 30 tablet 0  . nicotine (NICODERM CQ - DOSED IN MG/24 HOURS) 21 mg/24hr patch Place 1 patch (21 mg total) onto the skin daily. (Patient not taking: Reported on 04/26/2016) 28 patch 0  . nitrofurantoin, macrocrystal-monohydrate, (MACROBID) 100 MG capsule Take 1 capsule (100 mg total) by mouth 2 (two) times daily. (Patient not taking: Reported on 04/26/2016) 10 capsule 0  . phenazopyridine (PYRIDIUM) 200 MG tablet Take 1 tablet (200 mg total) by mouth 3 (three) times daily. (Patient not taking: Reported on 04/26/2016) 6 tablet 0  . valACYclovir (VALTREX) 1000 MG tablet Take 1 tablet (1,000 mg total) by mouth 2 (two) times daily. (Patient not taking: Reported on 04/26/2016) 10 tablet 0    Musculoskeletal:  Unable to assess via camera   Psychiatric Specialty Exam: Physical Exam  Review of Systems  Psychiatric/Behavioral: Negative for depression, hallucinations, memory loss, substance abuse and suicidal ideas. The patient is not nervous/anxious and does not have insomnia.     Blood pressure 106/67, pulse 67, temperature 97.7 F (36.5 C), temperature source Oral, resp. rate 18, last menstrual period 03/29/2016, SpO2 100 %.There is no height or weight on file to calculate BMI.  General Appearance: Casual  Eye Contact:  Good  Speech:  Clear and Coherent  Volume:  Normal  Mood:  Euthymic  Affect:  Appropriate  Thought Process:  Coherent and Goal Directed  Orientation:  Full (Time, Place, and Person)  Thought Content:  WDL  Suicidal Thoughts:  No  Homicidal Thoughts:  No  Memory:  Immediate;   Good Recent;   Good Remote;   Good  Judgement:  Fair  Insight:  Present  Psychomotor Activity:  Normal  Concentration:  Concentration: Good and Attention Span: Good  Recall:  Good  Fund of Knowledge:  Good  Language:  Good  Akathisia:   No  Handed:  Right  AIMS (if indicated):     Assets:  Communication Skills Desire for Improvement Financial Resources/Insurance Housing Intimacy Leisure Time Physical Health Resilience Social Support  ADL's:  Intact  Cognition:  WNL  Sleep:        Treatment Plan Summary: Patient reports improvement in symptoms since being [facility] to  the [facility]. She has been compliant with her prescribed medications. Patient is requesting discharge and appears stable to go home. She intends to follow up with [facility] this week. Discussed with EDP to rescind IVC facilitate discharge. Please provide the patient with prescriptions for psychotropic medications.   Disposition: No evidence of imminent risk to self or others at present.   Patient does not meet criteria for psychiatric inpatient admission. Supportive therapy provided about ongoing stressors. Discussed crisis plan, support from social network, calling 911, coming to the Emergency Department, and calling Suicide Hotline.  Fransisca Kaufmann, NP 05/01/2016 3:26 PM

## 2016-05-16 ENCOUNTER — Telehealth: Payer: Self-pay | Admitting: *Deleted

## 2016-05-25 ENCOUNTER — Encounter: Payer: Self-pay | Admitting: Physician Assistant

## 2016-05-25 ENCOUNTER — Ambulatory Visit: Payer: Medicaid Other | Attending: Internal Medicine | Admitting: Physician Assistant

## 2016-05-25 VITALS — BP 122/81 | HR 81 | Temp 98.1°F | Resp 16 | Wt 168.4 lb

## 2016-05-25 DIAGNOSIS — Z789 Other specified health status: Secondary | ICD-10-CM | POA: Insufficient documentation

## 2016-05-25 DIAGNOSIS — Z7984 Long term (current) use of oral hypoglycemic drugs: Secondary | ICD-10-CM | POA: Diagnosis not present

## 2016-05-25 DIAGNOSIS — R32 Unspecified urinary incontinence: Secondary | ICD-10-CM | POA: Diagnosis not present

## 2016-05-25 DIAGNOSIS — R413 Other amnesia: Secondary | ICD-10-CM | POA: Diagnosis not present

## 2016-05-25 DIAGNOSIS — R4182 Altered mental status, unspecified: Secondary | ICD-10-CM | POA: Diagnosis present

## 2016-05-25 DIAGNOSIS — Z79899 Other long term (current) drug therapy: Secondary | ICD-10-CM | POA: Diagnosis not present

## 2016-05-25 DIAGNOSIS — R7309 Other abnormal glucose: Secondary | ICD-10-CM

## 2016-05-25 DIAGNOSIS — Z880 Allergy status to penicillin: Secondary | ICD-10-CM | POA: Diagnosis not present

## 2016-05-25 LAB — POCT GLYCOSYLATED HEMOGLOBIN (HGB A1C): Hemoglobin A1C: 5.2

## 2016-05-25 LAB — POCT URINALYSIS DIPSTICK
BILIRUBIN UA: NEGATIVE
GLUCOSE UA: NEGATIVE
KETONES UA: NEGATIVE
NITRITE UA: NEGATIVE
PH UA: 6
RBC UA: NEGATIVE
SPEC GRAV UA: 1.015
Urobilinogen, UA: 1

## 2016-05-25 LAB — GLUCOSE, POCT (MANUAL RESULT ENTRY): POC Glucose: 97 mg/dl (ref 70–99)

## 2016-05-25 NOTE — Progress Notes (Signed)
Patient ID: Jody [name], female   DOB: [birth year], 45 y.o.   MRN: [id number]   Jody [name], is a 45 y.o. female  GEX:[id number]:[id number]  [id number]  DOB - [birth year]  Subjective:  Chief Complaint and HPI: Jody PrimesRosalind Alabi is a 45 y.o. female here today to establish care and for a follow up visit after being seen in the ED as well as out of town practices/ED for recurrent UTI and mental status changes.  The patient is under the care of [facility]er [facility].  She has schizophrenia and bipolar disorder that has been long-standing with a recent admission [facility] for 7 days after IVC by her daughters due to acute mental status changes(first week of August).  Prior to about 1 month ago, the patient has been able to accomplish ADL without assistance, drive, prepare meals, etc  About 1 month ago, her 2 daughters that are local began to notice several changes that they have never observed. History is given by patient's daughter Jody [name].  (They have a prescription for Cipro to pick up after an Urgent care visit last week for UTI).  Her daughters have noticed that she is having trouble with ambulating.  She is "shuffling" or walking on her tip toes and is unable to walk a straight line.   At times, they have noticed her leaning/slumping when she is sitting.  She is unable to perform ADL such as eating or getting dressed on her own.  She has been bed-wetting. She seems to "zone out."  Even forgot to change or put on maxi-pads when she was last on her period. At times she doesn't recongnize her daughters.  They need someone with her all the time right now and are requesting home health assistance. Most recent psychiatric med changes were in the first week of August. The changes they noticed were prior to any medication adjustments. During the week she was inpatient [facility], some of her symptoms began to improve but she quickly deteriorated once home.  She denies SI/intent.   MMSE today  = 15 ED/Hospital notes reviewed that [facility].  CT head was normal this year.     ROS:   Constitutional:  No f/c, No night sweats, No unexplained weight loss. EENT:  No vision changes, No blurry vision, No hearing changes. No mouth, throat, or ear problems.  Respiratory: No cough, No SOB Cardiac: No CP, no palpitations GI:  No abd pain, No N/V/D. GU: No Urinary s/sx Musculoskeletal: No joint pain Neuro: No headache, no dizziness, no motor weakness.  Skin: No rash Endocrine:  No polydipsia. No polyuria.  Psych: Denies SI/HI  No problems updated.  ALLERGIES: Allergies  Allergen Reactions  . Penicillins Itching and Rash    PAST MEDICAL HISTORY: Past Medical History:  Diagnosis Date  . Anxiety and depression   . Cystitis, interstitial   . Elevated troponin    a. 09/2015: CP/elevated troponin up to 0.11 - unclear significance. CT neg for PE, LHC neg for CAD, normal echo.  . Microcytic anemia    Noted on labs  . Panic attack   . Paranoid schizophrenia (HCC)   . Tobacco abuse     MEDICATIONS AT HOME: Prior to Admission medications   Medication Sig Start Date End Date Taking? Authorizing Provider  divalproex (DEPAKOTE) 500 MG DR tablet Take 1,500 mg by mouth at bedtime.   Yes Historical Provider, MD  lurasidone (LATUDA) 40 MG TABS tablet Take 40 mg  by mouth daily with breakfast.   Yes Historical Provider, MD  metFORMIN (GLUCOPHAGE) 500 MG tablet Take 1 tablet (500 mg total) by mouth daily with breakfast. 02/03/15  Yes Thermon Leyland, NP  traZODone (DESYREL) 100 MG tablet Take 1 tablet (100 mg total) by mouth at bedtime as needed for sleep. Patient taking differently: Take 100 mg by mouth at bedtime.  02/02/15  Yes Thermon Leyland, NP  cyanocobalamin 500 MCG tablet Take 1 tablet (500 mcg total) by mouth daily. Patient not taking: Reported on 04/26/2016 02/02/15   Thermon Leyland, NP     Objective:  EXAM:   Vitals:   05/25/16 1511  BP: 122/81  Pulse: 81  Resp:  16  Temp: 98.1 F (36.7 C)  TempSrc: Oral  SpO2: 95%  Weight: 168 lb 6.4 oz (76.4 kg)    General appearance : A&OX3. NAD. Non-toxic-appearing.  Daughter does majority of history.  Patient will occasionally nod and interact.  Hesitant gait with labored ambulation but able to walk without assistance.  Unable to perform tip toe/hell to toe walking HEENT: Atraumatic and Normocephalic.  PERRLA. EOM intact but she has difficulty tracking .  TM clear B. Mouth-MMM, post pharynx WNL w/o erythema, No PND. Neck: supple, no JVD. No cervical lymphadenopathy. No thyromegaly Chest/Lungs:  Breathing-non-labored, Good air entry bilaterally, breath sounds normal without rales, rhonchi, or wheezing  CVS: S1 S2 regular, no murmurs, gallops, rubs  Extremities: Bilateral Lower Ext shows no edema, both legs are warm to touch with = pulse throughout Neurology:  She is unable to perform heel-to-toe or finger to nose or follow many instructions. Becomes tearful and frustrated.  More Comprehensive exam will be deferred to neurology Skin:  No Rash  Data Review Lab Results  Component Value Date   HGBA1C 5.7 (H) 09/28/2015   HGBA1C 5.5 09/18/2014     Assessment & Plan   1. Altered mental status, unspecified altered mental status type This is new and has not been associated with her mental illness in the past.  Normal CT of head this year. Paperwork Requesting Home health assessment faxed. Impaired neurological exam - TSH - Sedimentation Rate - Ambulatory referral to Neurology  2. Enuresis Take cipro that was prescribed by Urgent Care - Urinalysis Dipstick - Urine culture  3. Memory changes This is new and has not been associated with her mental illness in the past.  Normal CT of head this year. Paperwork Requesting Home health assessment faxed  - TSH - Sedimentation Rate - Ambulatory referral to Neurology  4. Decreased activities of daily living (ADL) This is new and has not been associated with  her mental illness in the past.  Normal CT of head this year. Paperwork Requesting Home health assessment faxed - TSH - Sedimentation Rate - Ambulatory referral to Neurology  5. Elevated glucose I am unsure why she is on metformin.  Her daughter says it is for something other than diabetes.  She will find out and bring information to next visit. Normal A1C and glucose today - Glucose (CBG) - HgB A1c  Unclear if there is something pathological going on.  Further work-up needed.  ROI signed for Dr. Marga Melnick.    Patient have been counseled extensively about nutrition and exercise  Return in about 4 weeks (around 06/22/2016) for establish with PCP; f/up on mental status changes/home health needs.  The patient was given clear instructions to go to ER or return to medical center if symptoms don't improve, worsen  or new problems develop. The patient verbalized understanding. The patient was told to call to get lab results if they haven't heard anything in the next week.     Georgian Co, PA-C [facility] [place], [place] [phone]   05/25/2016, 4:13 PM

## 2016-05-25 NOTE — Progress Notes (Signed)
Patient is in the office today for a ED follow up Pt daughter states she went to [facility] last week on Thursday for a uti They prescribed her cipro 500mg  Pt states her pain level today in the office is a 10 Pt states it hurts to use the bathroom

## 2016-05-26 ENCOUNTER — Ambulatory Visit: Payer: Medicaid Other | Attending: Internal Medicine

## 2016-05-26 NOTE — Progress Notes (Signed)
Pt is here for lab work only.  Pt was referred to Drawing station due to [employer] and PA unable to obtain specimen.

## 2016-05-27 LAB — URINE CULTURE: ORGANISM ID, BACTERIA: NO GROWTH

## 2016-05-29 ENCOUNTER — Telehealth: Payer: Self-pay

## 2016-05-29 NOTE — Telephone Encounter (Signed)
Contacted pt and daughter to go over urine results neither answered lvm on both numbers asking them to give me a call back at their earliest convenience

## 2016-05-31 ENCOUNTER — Telehealth: Payer: Self-pay

## 2016-05-31 NOTE — Telephone Encounter (Signed)
Contacted pt daughter to go over urine results. Pt daughter is aware of results and doesn't have any questions or concerns

## 2016-06-28 ENCOUNTER — Encounter: Payer: Self-pay | Admitting: Neurology

## 2016-06-28 ENCOUNTER — Ambulatory Visit (INDEPENDENT_AMBULATORY_CARE_PROVIDER_SITE_OTHER): Payer: Medicaid Other | Admitting: Neurology

## 2016-06-28 VITALS — BP 139/89 | HR 88 | Ht 64.0 in | Wt 170.6 lb

## 2016-06-28 DIAGNOSIS — R404 Transient alteration of awareness: Secondary | ICD-10-CM | POA: Diagnosis not present

## 2016-06-28 DIAGNOSIS — E538 Deficiency of other specified B group vitamins: Secondary | ICD-10-CM | POA: Diagnosis not present

## 2016-06-28 DIAGNOSIS — R7309 Other abnormal glucose: Secondary | ICD-10-CM

## 2016-06-28 DIAGNOSIS — F05 Delirium due to known physiological condition: Secondary | ICD-10-CM | POA: Diagnosis not present

## 2016-06-28 DIAGNOSIS — E519 Thiamine deficiency, unspecified: Secondary | ICD-10-CM

## 2016-06-28 DIAGNOSIS — R410 Disorientation, unspecified: Secondary | ICD-10-CM

## 2016-06-28 DIAGNOSIS — R251 Tremor, unspecified: Secondary | ICD-10-CM | POA: Diagnosis not present

## 2016-06-28 DIAGNOSIS — R413 Other amnesia: Secondary | ICD-10-CM | POA: Diagnosis not present

## 2016-06-28 NOTE — Patient Instructions (Signed)
Remember to drink plenty of fluid, eat healthy meals and do not skip any meals. Try to eat protein with a every meal and eat a healthy snack such as fruit or nuts in between meals. Try to keep a regular sleep-wake schedule and try to exercise daily, particularly in the form of walking, 20-30 minutes a day, if you can.   As far as diagnostic testing: MRI brain, labs and EEG  I would like to see you back in 3 months, sooner if we need to. Please call us with any interim questions, concerns, problems, updates or refill requests.   Our phone number is [phone]. We also have an after hours call service for urgent matters and there is a physician on-call for urgent questions. For any emergencies you know to call 911 or go to the nearest emergency room

## 2016-06-28 NOTE — Progress Notes (Signed)
[facility]    Provider:  Dr Lucia GaskinsAhern Referring Provider: Anders [name], Jody M, PA-C Primary Care Physician:  Jody [name], Jody M, PA-C CC:  Altered mental status  HPI:  Jody SpruceRosalind L [name] is a 45 y.o. female here as a referral from Dr. Sharon SellerMcClung for altered mental status and memory changes. Past medical history depression and anxiety, Schizophrenia (managed per daughter), tobacco use, hallucinations, headache. She is here with her daughter who provides much information. She has been :zoning out" and bedwetting and at times unresponsive. Patient has schizophrenia and she hears voices and has paranoia but she has had this for many years and she is well managed. Recently this year or longer her memory is declining, she can';t tell anyone the day, what time it is or what season it is. She can forget anything. She forgets that she has kids sometimes, forgets things are in her purse. Memories lost are both old and new. Started in July of this year, she has had 3-4 UTIs since July and she has confusion with the UTIs. She has been treated and no UTIs now. She is not responsive all the time. She stares. They find urine in the bed. There is rinigng in the ears. Motor skills are decreased, sometimes can't feed herself or wipe herself or brush her teeth unless they prompt her to do it, she gets frustrated and it comes and goes. She has mild tremors sometimes and that is difficult but sometimes her hands just fall like she has no control over it. She will pick up her sand which and then her hands will just fall and she drops the food. It is not the tremors, more loss of control of motor function or confusion on the steps needed. Her walking was bad in August but it has improved. She has been falling. She is confused since July. There has been stress in her life. She has been more paranoid this year but still her schizophrenia has been controlled for years and unclear why her memory would decline so  substantially. She can;t cook, can't follow the steps. Symptoms progressive, nothing improves her memory, no other associated symptoms or modifying factors. Prior to this decline patient was able to make meals, drive and now she can't.  No drug or alcohol use. She denies SI/intent. Unknown FHx of dementia.  Reviewed notes, labs and imaging from outside physicians, which showed:  BUN 7, creatinine 1.15 04/26/2016. TSH 6 09/28/2015.  Personally reviewed CT head and agree with the following: There is no evidence for acute hemorrhage, hydrocephalus, mass lesion, or abnormal extra-axial fluid collection. No definite CT evidence for acute infarction. Prominence the lateral ventricles is stable in the interval. The visualized paranasal sinuses and mastoid air cells are clear.  ED 7/27: Patient admitted psychotic, walking around the room naked and not aware of the situation. Per her mother patient psychotic over the last several days. Patient's mother reported that on 7/14 when they were in [place] visiting family the patient began to have changes in behavior. She previously reported patient became confused, had intermittent episodes of being nonverbal and appeared to be a per stimulated when she reported that she was having auditory hallucinations. Mother also reports that patient began having intermittent episodes of urinary incontinence, right sided weakness and difficulty ambulating. The mother reported patient was evaluated in the ED in [place], was diagnosed with UTI and reported having negative stroke workup including labs and CT head. She was admitted and treated. Subsequently she displayed "normal behavior, thought  process.Marland Kitchenand displays clear thinking with a plan"  Review of Systems: Patient complains of symptoms per HPI as well as the following symptoms: Weight loss, shortness of breath, easy bruising, increased thirst, ringing in ears, trouble swallowing, aching muscles, urination problems,  incontinence, memory loss, confusion, headache, weakness, slurred speech, sleepiness, depression, anxiety, too much sleep, decreased energy, change in appetite, disinterest in activities, suicidal thoughts, hallucinations, racing thoughts. Pertinent negatives per HPI. All others negative.   Social History   Social History  . Marital status: Single    Spouse name: N/A  . Number of children: 5  . Years of education: 12   Occupational History  . disabled    Social History Main Topics  . Smoking status: Former Smoker    Packs/day: 0.25    Years: 4.00    Types: Cigarettes    Quit date: 03/01/2015  . Smokeless tobacco: Never Used  . Alcohol use 0.0 oz/week  . Drug use: No  . Sexual activity: Yes    Birth control/ protection: Condom   Other Topics Concern  . Not on file   Social History Narrative   Lives alone   Caffeine use: Soda- daily    Family History  Problem Relation Age of Onset  . Diabetes Father   . Hypertension Mother   . Hypertension Sister   . Lupus Sister   . Heart disease Maternal Grandfather   . Lupus      Past Medical History:  Diagnosis Date  . Anxiety and depression   . Cystitis, interstitial   . Elevated troponin    a. 09/2015: CP/elevated troponin up to 0.11 - unclear significance. CT neg for PE, LHC neg for CAD, normal echo.  . Microcytic anemia    Noted on labs  . Panic attack   . Paranoid schizophrenia (HCC)   . Tobacco abuse     Past Surgical History:  Procedure Laterality Date  . CARDIAC CATHETERIZATION N/A 09/29/2015   Procedure: Left Heart Cath and Coronary Angiography;  Surgeon: Corky Crafts, MD;  [facility]: [facility] INVASIVE CV LAB;  Service: Cardiovascular;  Laterality: N/A;  . TUBAL LIGATION      Current Outpatient Prescriptions  Medication Sig Dispense Refill  . divalproex (DEPAKOTE) 500 MG DR tablet Take 500 mg by mouth at bedtime.     Marland Kitchen lurasidone (LATUDA) 40 MG TABS tablet Take 40 mg by mouth daily with breakfast.    .  metFORMIN (GLUCOPHAGE) 500 MG tablet Take 1 tablet (500 mg total) by mouth daily with breakfast. 30 tablet 0  . traZODone (DESYREL) 100 MG tablet Take 1 tablet (100 mg total) by mouth at bedtime as needed for sleep. (Patient taking differently: Take 100 mg by mouth at bedtime. ) 30 tablet 0   No current facility-administered medications for this visit.     Allergies as of 06/28/2016 - Review Complete 06/28/2016  Allergen Reaction Noted  . Penicillins Itching and Rash 06/09/2012    Vitals: BP 139/89 (BP Location: Right Arm, Patient Position: Sitting, Cuff Size: Normal)   Pulse 88   Ht 5\' 4"  (1.626 m)   Wt 170 lb 9.6 oz (77.4 kg)   BMI 29.28 kg/m  Last Weight:  Wt Readings from Last 1 Encounters:  06/28/16 170 lb 9.6 oz (77.4 kg)   Last Height:   Ht Readings from Last 1 Encounters:  06/28/16 5\' 4"  (1.626 m)   Physical exam: Exam: Gen: NAD, not conversant  CV: RRR, no MRG. No Carotid Bruits. No peripheral edema, warm, nontender Eyes: Conjunctivae clear without exudates or hemorrhage  Neuro: Detailed Neurologic Exam  Speech:    Speech is normal; fluent, not spontaneous with impaired comprehension.  Cognition:  MMSE - Mini Mental State Exam 06/28/2016 05/25/2016 03/11/2015  Orientation to time 2 2 4   Orientation to Place 3 3 5   Registration 3 3 3   Attention/ Calculation 0 0 5  Recall 1 2 1   Language- name 2 objects 2 2 2   Language- repeat 1 0 1  Language- follow 3 step command 3 3 3   Language- read & follow direction 1 0 1  Write a sentence 1 0 1  Copy design 1 0 0  Total score 18 15 26       The patient is oriented to person, place, year and month    recent and remote memory impaired;     language fluent;     Impaired attention, concentration,     fund of knowledge impaired Cranial Nerves:    The pupils are equal, round, and reactive to light. Attempted fundoscopic exam could not visualize due to small pupils. Visual fields are full to finger  confrontation. Extraocular movements are intact. Trigeminal sensation is intact and the muscles of mastication are normal. The face is symmetric. The palate elevates in the midline. Hearing intact. Voice is normal. Shoulder shrug is normal. The tongue has normal motion without fasciculations.   Coordination:    Normal finger to nose and heel to shin but difficulty understanding instructions  Gait:    Good stride and stance  Motor Observation: mild postural tremor    Tone:    Normal muscle tone.    Posture:    Posture is normal. normal erect    Strength:    Strength is V/V in the upper and lower limbs.      Sensation: intact to LT     Reflex Exam:  DTR's:    Deep tendon reflexes in the upper and lower extremities are normal bilaterally.   Toes:    The toes are downgoing bilaterally.   Clonus:    Clonus is absent.       Assessment/Plan:  45 year old female with schizophrenia that has been stable for years with acute impairment of memory and progressive decline. MMSE over the last year 26 (03/2015), 15(05/2016), 18(06/2016). Per daughter no history of this kind of memory loss and her psychiatric issues have not significantly worsened to account for her cognitive decline. She has staring spells, she is sometimes non responsiveness, she has urinated in the bed. This may be due to psychiatric decline however, per notes she was treated for acute psychosis in July and received Geodon in the ED, subsequently she displayed "normal behavior, thought process.Marland Kitchenand displays clear thinking with a plan".   MRI of the brain w/wo contrast Will perform a thorough serum screening and urine comprehensive drug test Patient is unable to drive, operate heavy machinery, perform activities at heights or participate in water activities alone Can consider neuropsychological testing after workup complete but this may be due to psychiatric decline given her recent episode of psychosis in the ED in July.  CC:  Jody Simmonds, PA-C  Naomie Dean, MD  [facility] [address] [place], [place] [address]  Phone [phone] Fax [phone]

## 2016-07-01 LAB — THYROID PANEL WITH TSH
Free Thyroxine Index: 1.8 (ref 1.2–4.9)
T3 UPTAKE RATIO: 25 % (ref 24–39)
T4 TOTAL: 7 ug/dL (ref 4.5–12.0)
TSH: 1.56 u[IU]/mL (ref 0.450–4.500)

## 2016-07-01 LAB — ENA+DNA/DS+SJORGEN'S
ENA RNP Ab: 6.4 AI — ABNORMAL HIGH (ref 0.0–0.9)
ENA SM Ab Ser-aCnc: <0.2 AI (ref 0.0–0.9)
ENA SSA (RO) AB: 2.1 AI — AB (ref 0.0–0.9)
ENA SSB (LA) Ab: <0.2 AI (ref 0.0–0.9)
dsDNA Ab: <1 IU/mL (ref 0–9)

## 2016-07-01 LAB — COMPREHENSIVE METABOLIC PANEL
A/G RATIO: 1.7 (ref 1.2–2.2)
ALK PHOS: 52 IU/L (ref 39–117)
ALT: 8 IU/L (ref 0–32)
AST: 15 IU/L (ref 0–40)
Albumin: 3.9 g/dL (ref 3.5–5.5)
BUN/Creatinine Ratio: 9 (ref 9–23)
BUN: 9 mg/dL (ref 6–24)
Bilirubin Total: <0.2 mg/dL (ref 0.0–1.2)
CO2: 23 mmol/L (ref 18–29)
Calcium: 9 mg/dL (ref 8.7–10.2)
Chloride: 102 mmol/L (ref 96–106)
Creatinine, Ser: 1 mg/dL (ref 0.57–1.00)
GFR calc Af Amer: 80 mL/min/{1.73_m2} (ref >59)
GFR, EST NON AFRICAN AMERICAN: 69 mL/min/{1.73_m2} (ref >59)
GLOBULIN, TOTAL: 2.3 g/dL (ref 1.5–4.5)
Glucose: 68 mg/dL (ref 65–99)
POTASSIUM: 4 mmol/L (ref 3.5–5.2)
SODIUM: 140 mmol/L (ref 134–144)
Total Protein: 6.2 g/dL (ref 6.0–8.5)

## 2016-07-01 LAB — B12 AND FOLATE PANEL
FOLATE: 7.4 ng/mL (ref >3.0)
VITAMIN B 12: 599 pg/mL (ref 211–946)

## 2016-07-01 LAB — AMMONIA: AMMONIA: 31 ug/dL (ref 19–87)

## 2016-07-01 LAB — HEMOGLOBIN A1C
ESTIMATED AVERAGE GLUCOSE: 108 mg/dL
HEMOGLOBIN A1C: 5.4 % (ref 4.8–5.6)

## 2016-07-01 LAB — RPR: RPR Ser Ql: NONREACTIVE

## 2016-07-01 LAB — ANA W/REFLEX: Anti Nuclear Antibody(ANA): POSITIVE — AB

## 2016-07-01 LAB — THYROID PEROXIDASE ANTIBODY: Thyroperoxidase Ab SerPl-aCnc: 10 IU/mL (ref 0–34)

## 2016-07-01 LAB — VITAMIN B1: Thiamine: 166 nmol/L (ref 66.5–200.0)

## 2016-07-01 LAB — METHYLMALONIC ACID, SERUM: METHYLMALONIC ACID: 90 nmol/L (ref 0–378)

## 2016-07-01 LAB — THYROGLOBULIN ANTIBODY

## 2016-07-01 LAB — HIV ANTIBODY (ROUTINE TESTING W REFLEX): HIV Screen 4th Generation wRfx: NONREACTIVE

## 2016-07-01 LAB — SEDIMENTATION RATE: SED RATE: 6 mm/h (ref 0–32)

## 2016-07-05 ENCOUNTER — Telehealth: Payer: Self-pay | Admitting: Neurology

## 2016-07-05 NOTE — Telephone Encounter (Signed)
Pt's daughter, Janelle Flooraomi,  called for lab results.

## 2016-07-05 NOTE — Telephone Encounter (Signed)
Dr Lucia GaskinsAhern- please advise. Daughter calling about lab results

## 2016-07-05 NOTE — Telephone Encounter (Signed)
I spoke to daughter. Labs unremarkable. The +ANA with low ena rnp and ssa are likely not clinically significant and would not cause he memory issues that are likely psychiatric an dnot neurodegenerative. thanks

## 2016-07-11 ENCOUNTER — Other Ambulatory Visit: Payer: Self-pay | Admitting: Neurology

## 2016-07-16 ENCOUNTER — Inpatient Hospital Stay: Admission: RE | Admit: 2016-07-16 | Payer: Self-pay | Source: Ambulatory Visit

## 2016-07-26 ENCOUNTER — Other Ambulatory Visit: Payer: Medicaid Other

## 2016-07-29 ENCOUNTER — Inpatient Hospital Stay: Admission: RE | Admit: 2016-07-29 | Payer: Self-pay | Source: Ambulatory Visit

## 2016-08-01 ENCOUNTER — Telehealth: Payer: Self-pay | Admitting: Internal Medicine

## 2016-08-01 ENCOUNTER — Other Ambulatory Visit: Payer: Self-pay | Admitting: Internal Medicine

## 2016-08-01 DIAGNOSIS — N390 Urinary tract infection, site not specified: Secondary | ICD-10-CM

## 2016-08-01 NOTE — Telephone Encounter (Signed)
Pt should make fu appt w/ someone in clinic to establish pcp care. hasnt seen anyone but Jody [name]. I put in referral for uro, but not certain if she has [employer] card, etc. May take 1-2 wks to here from referral specialist.

## 2016-08-01 NOTE — Telephone Encounter (Signed)
Will patient need a appointment with you? I seen that she was suppose to have a 4 week f/u but hasn't schedule one

## 2016-08-01 NOTE — Telephone Encounter (Signed)
Could you please schedule an appointment

## 2016-08-01 NOTE — Telephone Encounter (Signed)
Patient's daughter is requesting referral for Urology... Daughter states patient continues to get UTI's  Please follow up

## 2016-08-08 ENCOUNTER — Ambulatory Visit (INDEPENDENT_AMBULATORY_CARE_PROVIDER_SITE_OTHER): Payer: Medicaid Other

## 2016-08-08 DIAGNOSIS — R410 Disorientation, unspecified: Secondary | ICD-10-CM

## 2016-08-08 DIAGNOSIS — R404 Transient alteration of awareness: Secondary | ICD-10-CM

## 2016-08-08 NOTE — Procedures (Signed)
     History: Jody [name] is a 45 year old patient with a history of schizophrenia who has had episodes of bedwetting and "zoning out". At times, the patient may be unresponsive. The patient is being evaluated for these events.  This is a routine EEG. No skull defects are noted. Medications include Depakote, Latuda, Glucophage, and trazodone.  EEG classification: Dysrhythmia grade 1 generalized  Description of the recording: The background rhythms of this recording consists of a moderately well modulated medium amplitude 8 Hz alpha rhythm that is reactive to eye opening and closure. As record progresses, episodes of intermittent 6-7 Hz slowing is seen in a generalized fashion during the recording. The patient never clearly enters the drowsy state or stage II sleep during the recording. Photic stimulation is performed, and this results in a bilateral photic driving response. Hyperventilation was also performed, no significant buildup of the background rhythm activities were seen during hyperventilation. At no time during the recording does there appear to be evidence of spike or spike-wave discharges or evidence of focal slowing. EKG monitor shows no evidence of cardiac rhythm abnormalities with a heart rate of 90.  Impression: This is an abnormal EEG recording secondary to mild intermittent theta frequency slowing. This is a nonspecific recording and can be seen with any process that results in a toxic or metabolic encephalopathy or any dementing type illness. No epileptiform discharges were seen.

## 2016-08-09 ENCOUNTER — Telehealth: Payer: Self-pay | Admitting: *Deleted

## 2016-08-09 ENCOUNTER — Ambulatory Visit
Admission: RE | Admit: 2016-08-09 | Discharge: 2016-08-09 | Disposition: A | Discharge status: Home / Self Care | Payer: Medicaid Other | Source: Ambulatory Visit | Attending: Neurology | Admitting: Neurology

## 2016-08-09 ENCOUNTER — Other Ambulatory Visit: Payer: Self-pay

## 2016-08-09 ENCOUNTER — Telehealth: Payer: Self-pay | Admitting: Physician Assistant

## 2016-08-09 DIAGNOSIS — R413 Other amnesia: Secondary | ICD-10-CM

## 2016-08-09 DIAGNOSIS — R404 Transient alteration of awareness: Secondary | ICD-10-CM

## 2016-08-09 DIAGNOSIS — N390 Urinary tract infection, site not specified: Secondary | ICD-10-CM

## 2016-08-09 DIAGNOSIS — R410 Disorientation, unspecified: Secondary | ICD-10-CM

## 2016-08-09 DIAGNOSIS — F05 Delirium due to known physiological condition: Secondary | ICD-10-CM

## 2016-08-09 MED ORDER — GADOBENATE DIMEGLUMINE 529 MG/ML IV SOLN
16.0000 mL | Freq: Once | INTRAVENOUS | Status: AC | PRN
  Administered 2016-08-09: 16 mL via INTRAVENOUS

## 2016-08-09 NOTE — Telephone Encounter (Signed)
Called and spoke to pt daughter about EEG results per AA, MD note. She verbalized understanding. She said she will bring patient Monday for drug comprehensive drug screening. I advised to make sure she drinks plenty of water prior to test to ensure we have enough for test. She verbalized understanding.

## 2016-08-09 NOTE — Telephone Encounter (Signed)
Will forward to Dr. Jegede.  

## 2016-08-09 NOTE — Telephone Encounter (Signed)
Daughter Jody [name] returned Emma's call, please call [phone].

## 2016-08-09 NOTE — Telephone Encounter (Signed)
Called and LVM for daughter, Janelle Flooraomi to call about results. Gave [facility] phone number.

## 2016-08-09 NOTE — Telephone Encounter (Signed)
Kathlene NovemberMike from [facility] called stated patient has had 5 UTI doesn't know who diagnosed her. Wants to know if we can refer to Urology. Informed Kathlene NovemberMike patient will need to schedule appointment for referral.

## 2016-08-09 NOTE — Telephone Encounter (Signed)
-----   Message from Anson FretAntonia B Ahern, MD sent at 08/08/2016  6:16 PM EST ----- eeg without seizures or epileptiform activity. BTW she never came back to give urine for the comprehensive drug analysis like she said she would I believe thanks

## 2016-08-16 ENCOUNTER — Telehealth: Payer: Self-pay

## 2016-08-16 NOTE — Telephone Encounter (Signed)
-----   Message from Anson FretAntonia B Ahern, MD sent at 08/15/2016  6:12 PM EST ----- There are no strokes, masses or lesions on the brain. There is brain volume loss or atrophy. Some atrophy is normal with aging but patient does have moreso than stated for age. This can be seen in bipolar disorder or schizophrenia as well as in other disorders that affect the brain like dementia. We have a follow up appointment in January we can review the images and discuss next steps. But the good news is that there are no strokes or masses or lesions as above.

## 2016-08-16 NOTE — Telephone Encounter (Signed)
I called pt to discuss MRI results. No answer, left a message asking her to call me back. 

## 2016-08-18 NOTE — Telephone Encounter (Signed)
LVM for pt daughter to call office back about MRI results. Gave [facility] phone number and advised office now close but will reopen Monday at 8am.

## 2016-08-21 NOTE — Telephone Encounter (Signed)
Pt's daughter returned call. Please call [phone]

## 2016-08-21 NOTE — Telephone Encounter (Signed)
LFt vm for patients daughter Janelle Flooraomi on dpr to call back about her moms results.

## 2016-08-21 NOTE — Telephone Encounter (Signed)
Patient's daughter is returning a call regarding MRI results for the patient.

## 2016-08-21 NOTE — Telephone Encounter (Signed)
I called daughter back. She voiced understanding. She requested a sooner appt due to being worries about the patient and her symptoms. I moved appt up to 12/13.

## 2016-09-13 ENCOUNTER — Ambulatory Visit: Payer: Self-pay | Admitting: Neurology

## 2016-09-13 ENCOUNTER — Telehealth: Payer: Self-pay | Admitting: *Deleted

## 2016-09-13 NOTE — Telephone Encounter (Signed)
no showed f/u 

## 2016-09-15 ENCOUNTER — Encounter: Payer: Self-pay | Admitting: Neurology

## 2016-10-05 ENCOUNTER — Ambulatory Visit: Payer: Self-pay | Admitting: Family Medicine

## 2016-10-10 ENCOUNTER — Ambulatory Visit: Payer: Medicaid Other | Admitting: Neurology

## 2016-10-13 ENCOUNTER — Ambulatory Visit: Payer: Medicare Other | Attending: Family Medicine | Admitting: Family Medicine

## 2016-10-13 VITALS — BP 132/83 | HR 82 | Temp 98.8°F | Resp 18 | Ht 64.0 in | Wt 181.8 lb

## 2016-10-13 DIAGNOSIS — Z87440 Personal history of urinary (tract) infections: Secondary | ICD-10-CM | POA: Diagnosis not present

## 2016-10-13 DIAGNOSIS — F209 Schizophrenia, unspecified: Secondary | ICD-10-CM | POA: Insufficient documentation

## 2016-10-13 DIAGNOSIS — N390 Urinary tract infection, site not specified: Secondary | ICD-10-CM

## 2016-10-13 DIAGNOSIS — Z7984 Long term (current) use of oral hypoglycemic drugs: Secondary | ICD-10-CM | POA: Insufficient documentation

## 2016-10-13 DIAGNOSIS — Z79899 Other long term (current) drug therapy: Secondary | ICD-10-CM | POA: Diagnosis not present

## 2016-10-13 NOTE — Progress Notes (Signed)
Subjective:  Patient ID: Jody [name], female    DOB: 05-14-1971  Age: 46 y.o. MRN: [id number]  CC: Establish Care   HPI Jody [name] presents to establish care for urology referral. Patient has a psychiatric history including schizophrenia.  She is accompanied by her daughter. Daughter reports pt. psychiatric problem began 3 years ago and limits her ability to comprehend and care for herself without assistance. She is being followed by neurology and psychiatry. Daughter reports family is in the process of obtaining power of attorney or guardianship for patient. History is obtained from both the patient and her daughter. Daughter reports history of 8  UTI's since July. Last UTI was in December. Denies any catheter use. Pt.and daughter report history of a bladder condition many years ago. However neither one of them can state what the bladder condition was and when it was diagnosed. Denies any symptoms associated with UTI. Denies any constitutional symptoms. Reports when patient starts to develop UTI she has foul smelling urine, begins urinating in the bed, weakness, and confusion, but denies any hematuria. Daughter reports mother has episodes of ambulating faster in the past and reports history of a fall 3 days ago while ambulating fast. Denies any injuries. Denies any dizziness or vision changes. Daughter and patient deny any concerns.  Outpatient Medications Prior to Visit  Medication Sig Dispense Refill  . divalproex (DEPAKOTE) 500 MG DR tablet Take 500 mg by mouth at bedtime.     Marland Kitchen lurasidone (LATUDA) 40 MG TABS tablet Take 40 mg by mouth daily with breakfast.    . metFORMIN (GLUCOPHAGE) 500 MG tablet Take 1 tablet (500 mg total) by mouth daily with breakfast. 30 tablet 0  . traZODone (DESYREL) 100 MG tablet Take 1 tablet (100 mg total) by mouth at bedtime as needed for sleep. (Patient taking differently: Take 100 mg by mouth at bedtime. ) 30 tablet 0   No facility-administered  medications prior to visit.     ROS Review of Systems  Constitutional: Negative.   Respiratory: Negative.   Cardiovascular: Negative.   Gastrointestinal: Negative.   Genitourinary: Negative.   Psychiatric/Behavioral: Positive for behavioral problems.       Schizophrenia.   Objective:  BP 132/83 (BP Location: Right Arm, Patient Position: Sitting, Cuff Size: Normal)   Pulse 82   Temp 98.8 F (37.1 C) (Oral)   Resp 18   Ht 5\' 4"  (1.626 m)   Wt 181 lb 12.8 oz (82.5 kg)   LMP 09/08/2016   SpO2 99%   BMI 31.21 kg/m   BP/Weight 10/13/2016 06/28/2016 05/25/2016  Systolic BP 132 139 122  Diastolic BP 83 89 81  Wt. (Lbs) 181.8 170.6 168.4  BMI 31.21 29.28 28.91  Some encounter information is confidential and restricted. Go to Review Flowsheets activity to see all data.    Physical Exam  Constitutional: She appears well-developed and well-nourished.  Eyes: Pupils are equal, round, and reactive to light.  Cardiovascular: Normal rate, regular rhythm, normal heart sounds and intact distal pulses.   Pulmonary/Chest: Effort normal and breath sounds normal.  Abdominal: Soft. Bowel sounds are normal.  Musculoskeletal: Normal range of motion.  Psychiatric: Her affect is inappropriate. Her speech is tangential. She expresses inappropriate judgment. She is inattentive.   Assessment & Plan:   Problem List Items Addressed This Visit    None    Visit Diagnoses    Frequent UTI    -  Primary   -Patient and daughter were instructed to have  patient use specimen cup to obtain sample   daughter reports patient urinated in the toilet instead.    -Instructed to come back to the clinic to obtain urine sample.    Relevant Orders   Ambulatory referral to Urology   POCT urinalysis dipstick   Urine culture      No orders of the defined types were placed in this encounter.   Follow-up: Return in about 1 week (around 10/20/2016) for Lab work.   Lizbeth BarkMandesia R Hairston FNP

## 2016-10-13 NOTE — Patient Instructions (Signed)
Urinary Tract Infection, Adult Introduction A urinary tract infection (UTI) is an infection of any part of the urinary tract. The urinary tract includes the:  Kidneys.  Ureters.  Bladder.  Urethra. These organs make, store, and get rid of pee (urine) in the body. Follow these instructions at home:  Take over-the-counter and prescription medicines only as told by your doctor.  If you were prescribed an antibiotic medicine, take it as told by your doctor. Do not stop taking the antibiotic even if you start to feel better.  Avoid the following drinks:  Alcohol.  Caffeine.  Tea.  Carbonated drinks.  Drink enough fluid to keep your pee clear or pale yellow.  Keep all follow-up visits as told by your doctor. This is important.  Make sure to:  Empty your bladder often and completely. Do not to hold pee for long periods of time.  Empty your bladder before and after sex.  Wipe from front to back after a bowel movement if you are female. Use each tissue one time when you wipe. Contact a doctor if:  You have back pain.  You have a fever.  You feel sick to your stomach (nauseous).  You throw up (vomit).  Your symptoms do not get better after 3 days.  Your symptoms go away and then come back. Get help right away if:  You have very bad back pain.  You have very bad lower belly (abdominal) pain.  You are throwing up and cannot keep down any medicines or water. This information is not intended to replace advice given to you by your health care provider. Make sure you discuss any questions you have with your health care provider. Document Released: 03/06/2008 Document Revised: 02/24/2016 Document Reviewed: 08/09/2015  2017 Elsevier Urinary Frequency The number of times a normal person urinates depends upon how much liquid they take in and how much liquid they are losing. If the temperature is hot and there is high humidity, then the person will sweat more and usually  breathe a little more frequently. These factors decrease the amount of frequency of urination that would be considered normal. The amount you drink is easily determined, but the amount of fluid lost is sometimes more difficult to calculate.  Fluid is lost in two ways:  Sensible fluid loss is usually measured by the amount of urine that you get rid of. Losses of fluid can also occur with diarrhea.  Insensible fluid loss is more difficult to measure. It is caused by evaporation. Insensible loss of fluid occurs through breathing and sweating. It usually ranges from a little less than a quart to a little more than a quart of fluid a day. In normal temperatures and activity levels, the average person may urinate 4 to 7 times in a 24-hour period. Needing to urinate more often than that could indicate a problem. If one urinates 4 to 7 times in 24 hours and has large volumes each time, that could indicate a different problem from one who urinates 4 to 7 times a day and has small volumes. The time of urinating is also important. Most urinating should be done during the waking hours. Getting up at night to urinate frequently can indicate some problems. CAUSES  The bladder is the organ in your lower abdomen that holds urine. Like a balloon, it swells some as it fills up. Your nerves sense this and tell you it is time to head for the bathroom. There are a number of reasons that you  might feel the need to urinate more often than usual. They include:  Urinary tract infection. This is usually associated with other signs such as burning when you urinate.  In men, problems with the prostate (a walnut-size gland that is located near the tube that carries urine out of your body). There are two reasons why the prostate can cause an increased frequency of urination:  An enlarged prostate that does not let the bladder empty well. If the bladder only half empties when you urinate, then it only has half the capacity to fill  before you have to urinate again.  The nerves in the bladder become more hypersensitive with an increased size of the prostate even if the bladder empties completely.  Pregnancy.  Obesity. Excess weight is more likely to cause a problem for women than for men.  Bladder stones or other bladder problems.  Caffeine.  Alcohol.  Medications. For example, drugs that help the body get rid of extra fluid (diuretics) increase urine production. Some other medicines must be taken with lots of fluids.  Muscle or nerve weakness. This might be the result of a spinal cord injury, a stroke, multiple sclerosis, or Parkinson disease.  Long-standing diabetes can decrease the sensation of the bladder. This loss of sensation makes it harder to sense the bladder needs to be emptied. Over a period of years, the bladder is stretched out by constant overfilling. This weakens the bladder muscles so that the bladder does not empty well and has less capacity to fill with new urine.  Interstitial cystitis (also called painful bladder syndrome). This condition develops because the tissues that line the inside of the bladder are inflamed (inflammation is the body's way of reacting to injury or infection). It causes pain and frequent urination. It occurs in women more often than in men. DIAGNOSIS   To decide what might be causing your urinary frequency, your health care provider will probably:  Ask about symptoms you have noticed.  Ask about your overall health. This will include questions about any medications you are taking.  Do a physical examination.  Order some tests. These might include:  A blood test to check for diabetes or other health issues that could be contributing to the problem.  Urine testing. This could measure the flow of urine and the pressure on the bladder.  A test of your neurological system (the brain, spinal cord, and nerves). This is the system that senses the need to urinate.  A  bladder test to check whether it is emptying completely when you urinate.  Cystoscopy. This test uses a thin tube with a tiny camera on it. It offers a look inside your urethra and bladder to see if there are problems.  Imaging tests. You might be given a contrast dye and then asked to urinate. X-rays are taken to see how your bladder is working. TREATMENT  It is important for you to be evaluated to determine if the amount or frequency that you have is unusual or abnormal. If it is found to be abnormal, the cause should be determined and this can usually be found out easily. Depending upon the cause, treatment could include medication, stimulation of the nerves, or surgery. There are not too many things that you can do as an individual to change your urinary frequency. It is important that you balance the amount of fluid intake needed to compensate for your activity and the temperature. Medical problems will be diagnosed and taken care of by your  physician. There is no particular bladder training such as Kegel exercises that you can do to help urinary frequency. This is an exercise that is usually recommended for people who have leaking of urine when they laugh, cough, or sneeze. HOME CARE INSTRUCTIONS   Take any medications your health care provider prescribed or suggested. Follow the directions carefully.  Practice any lifestyle changes that are recommended. These might include:  Drinking less fluid or drinking at different times of the day. If you need to urinate often during the night, for example, you may need to stop drinking fluids early in the evening.  Cutting down on caffeine or alcohol. They both can make you need to urinate more often than normal. Caffeine is found in coffee, tea, and sodas.  Losing weight, if that is recommended.  Keep a journal or a log. You might be asked to record how much you drink and when and where you feel the need to urinate. This will also help evaluate how  well the treatment provided by your physician is working. SEEK MEDICAL CARE IF:   Your need to urinate often gets worse.  You feel increased pain or irritation when you urinate.  You notice blood in your urine.  You have questions about any medications that your health care provider recommended.  You notice blood, pus, or swelling at the site of any test or treatment procedure.  You develop a fever of more than 100.7F (38.1C). SEEK IMMEDIATE MEDICAL CARE IF:  You develop a fever of more than 102.32F (38.9C). This information is not intended to replace advice given to you by your health care provider. Make sure you discuss any questions you have with your health care provider. Document Released: 07/15/2009 Document Revised: 10/09/2014 Document Reviewed: 04/14/2015 Elsevier Interactive Patient Education  2017 [employer].

## 2016-10-13 NOTE — Progress Notes (Signed)
Patient is here for establish care  Patient declined the flu shot today  Patient has taken meds and eaten today

## 2016-11-01 ENCOUNTER — Encounter: Payer: Self-pay | Admitting: Family Medicine

## 2016-11-01 ENCOUNTER — Ambulatory Visit: Payer: Medicare Other | Attending: Family Medicine | Admitting: Family Medicine

## 2016-11-01 ENCOUNTER — Ambulatory Visit (HOSPITAL_COMMUNITY)
Admission: RE | Admit: 2016-11-01 | Discharge: 2016-11-01 | Disposition: A | Discharge status: Home / Self Care | Payer: Medicare Other | Source: Ambulatory Visit | Attending: Family Medicine | Admitting: Family Medicine

## 2016-11-01 ENCOUNTER — Other Ambulatory Visit: Payer: Self-pay | Admitting: Family Medicine

## 2016-11-01 VITALS — BP 129/90 | HR 86 | Temp 98.2°F | Resp 18 | Ht 64.0 in | Wt 182.8 lb

## 2016-11-01 DIAGNOSIS — Z87440 Personal history of urinary (tract) infections: Secondary | ICD-10-CM

## 2016-11-01 DIAGNOSIS — R296 Repeated falls: Secondary | ICD-10-CM | POA: Insufficient documentation

## 2016-11-01 DIAGNOSIS — M545 Low back pain, unspecified: Secondary | ICD-10-CM

## 2016-11-01 DIAGNOSIS — Z9181 History of falling: Secondary | ICD-10-CM | POA: Diagnosis not present

## 2016-11-01 DIAGNOSIS — R300 Dysuria: Secondary | ICD-10-CM | POA: Diagnosis not present

## 2016-11-01 DIAGNOSIS — R32 Unspecified urinary incontinence: Secondary | ICD-10-CM | POA: Diagnosis present

## 2016-11-01 DIAGNOSIS — R269 Unspecified abnormalities of gait and mobility: Secondary | ICD-10-CM | POA: Diagnosis not present

## 2016-11-01 LAB — BASIC METABOLIC PANEL
BUN: 8 mg/dL (ref 7–25)
CALCIUM: 9.3 mg/dL (ref 8.6–10.2)
CO2: 26 mmol/L (ref 20–31)
CREATININE: 0.89 mg/dL (ref 0.50–1.10)
Chloride: 105 mmol/L (ref 98–110)
Glucose, Bld: 86 mg/dL (ref 65–99)
Potassium: 4.5 mmol/L (ref 3.5–5.3)
Sodium: 140 mmol/L (ref 135–146)

## 2016-11-01 LAB — CBC WITH DIFFERENTIAL/PLATELET
Basophils Absolute: 0 cells/uL (ref 0–200)
Basophils Relative: 0 %
EOS PCT: 1 %
Eosinophils Absolute: 46 cells/uL (ref 15–500)
HEMATOCRIT: 37.8 % (ref 35.0–45.0)
Hemoglobin: 11.7 g/dL (ref 11.7–15.5)
LYMPHS PCT: 41 %
Lymphs Abs: 1886 cells/uL (ref 850–3900)
MCH: 24.7 pg — ABNORMAL LOW (ref 27.0–33.0)
MCHC: 31 g/dL — AB (ref 32.0–36.0)
MCV: 79.9 fL — ABNORMAL LOW (ref 80.0–100.0)
MPV: 10.2 fL (ref 7.5–12.5)
Monocytes Absolute: 276 cells/uL (ref 200–950)
Monocytes Relative: 6 %
NEUTROS PCT: 52 %
Neutro Abs: 2392 cells/uL (ref 1500–7800)
Platelets: 220 10*3/uL (ref 140–400)
RBC: 4.73 MIL/uL (ref 3.80–5.10)
RDW: 15.4 % — AB (ref 11.0–15.0)
WBC: 4.6 10*3/uL (ref 3.8–10.8)

## 2016-11-01 MED ORDER — CIPROFLOXACIN HCL 500 MG PO TABS: 500.0000 mg | ORAL_TABLET | Freq: Two times a day (BID) | ORAL | 0 refills | Status: DC

## 2016-11-01 MED ORDER — CIPROFLOXACIN HCL 500 MG PO TABS: 500.0000 mg | ORAL_TABLET | Freq: Two times a day (BID) | ORAL | 0 refills | Status: AC

## 2016-11-01 MED ORDER — IBUPROFEN 600 MG PO TABS: 600.0000 mg | ORAL_TABLET | Freq: Three times a day (TID) | ORAL | 0 refills | Status: DC | PRN

## 2016-11-01 NOTE — Progress Notes (Signed)
Patient is here for UTI symptoms  Patient is here with sister  Patient urine has a strong odor, wet bed, frequent bathroom use, and urine its a dark brown orange color  Patient stated its been going on for 4 weeks but last Thursday she just move with sister and sister just notice the uti symptoms  Sister stated she been unbalance and she been having a lot of falls   Sister also notice she has shaky legs so she ask for physical therapy

## 2016-11-01 NOTE — Patient Instructions (Signed)
Urinary Tract Infection, Adult Introduction A urinary tract infection (UTI) is an infection of any part of the urinary tract. The urinary tract includes the:  Kidneys.  Ureters.  Bladder.  Urethra. These organs make, store, and get rid of pee (urine) in the body. Follow these instructions at home:  Take over-the-counter and prescription medicines only as told by your doctor.  If you were prescribed an antibiotic medicine, take it as told by your doctor. Do not stop taking the antibiotic even if you start to feel better.  Avoid the following drinks:  Alcohol.  Caffeine.  Tea.  Carbonated drinks.  Drink enough fluid to keep your pee clear or pale yellow.  Keep all follow-up visits as told by your doctor. This is important.  Make sure to:  Empty your bladder often and completely. Do not to hold pee for long periods of time.  Empty your bladder before and after sex.  Wipe from front to back after a bowel movement if you are female. Use each tissue one time when you wipe. Contact a doctor if:  You have back pain.  You have a fever.  You feel sick to your stomach (nauseous).  You throw up (vomit).  Your symptoms do not get better after 3 days.  Your symptoms go away and then come back. Get help right away if:  You have very bad back pain.  You have very bad lower belly (abdominal) pain.  You are throwing up and cannot keep down any medicines or water. This information is not intended to replace advice given to you by your health care provider. Make sure you discuss any questions you have with your health care provider. Document Released: 03/06/2008 Document Revised: 02/24/2016 Document Reviewed: 08/09/2015  2017 Elsevier  

## 2016-11-01 NOTE — Progress Notes (Signed)
Subjective:  Patient ID: Jody [name], female    DOB: 08-02-1971  Age: 46 y.o. MRN: [id number]  CC: Establish Care   HPI Jody [name] presents forcomplains of foul smelling urine, new onset enuresis and tea colored urine.  Patient  has history of mental illness and is not able to communicate effectively. She is accompanied by her sister and mother this visit. Sister reports seeking guardianship for patient. She has had symptoms for 1 week. Patient also complains of back pain. Patient denies fever and vaginal discharge. Patient does have a history of recurrent UTI.  Patient does not have a history of pyelonephritis. Patient was given specimen cup to urinate in. Sister reports assisting patient with specimen collection however patient failed to urinate in specimen cup twice. Sister also reports patient has history of falls. Reports pt. will have episodes where she arches her back backwards increase her walking pace and falls. Pt. denies any head injury. Pt. reports lower back pain.    Outpatient Medications Prior to Visit  Medication Sig Dispense Refill  . divalproex (DEPAKOTE) 500 MG DR tablet Take 500 mg by mouth at bedtime.     Marland Kitchen lurasidone (LATUDA) 40 MG TABS tablet Take 40 mg by mouth daily with breakfast.    . metFORMIN (GLUCOPHAGE) 500 MG tablet Take 1 tablet (500 mg total) by mouth daily with breakfast. 30 tablet 0  . traZODone (DESYREL) 100 MG tablet Take 1 tablet (100 mg total) by mouth at bedtime as needed for sleep. (Patient taking differently: Take 100 mg by mouth at bedtime. ) 30 tablet 0   No facility-administered medications prior to visit.     ROS Review of Systems  Genitourinary: Positive for dysuria and frequency.  Musculoskeletal: Positive for back pain.  Neurological:       Falls        Objective:  BP 129/90 (BP Location: Right Arm, Patient Position: Sitting, Cuff Size: Normal)   Pulse 86   Temp 98.2 F (36.8 C) (Oral)   Resp 18   Ht 5\' 4"  (1.626  m)   Wt 182 lb 12.8 oz (82.9 kg)   SpO2 99%   BMI 31.38 kg/m   BP/Weight 11/01/2016 10/13/2016 06/28/2016  Systolic BP 129 132 139  Diastolic BP 90 83 89  Wt. (Lbs) 182.8 181.8 170.6  BMI 31.38 31.21 29.28  Some encounter information is confidential and restricted. Go to Review Flowsheets activity to see all data.     Physical Exam  Cardiovascular: Normal rate, regular rhythm, normal heart sounds and intact distal pulses.   Pulmonary/Chest: Effort normal and breath sounds normal.  Abdominal: Soft. Bowel sounds are normal.  Musculoskeletal:       Lumbar back: She exhibits pain.  Neurological: She is alert. She has normal reflexes.  Psychiatric: Her affect is blunt. Her speech is delayed. She is slowed and withdrawn. Thought content is delusional. She expresses inappropriate judgment. She expresses no suicidal plans and no homicidal plans. She is inattentive.  Nursing note and vitals reviewed.    Assessment & Plan:   Problem List Items Addressed This Visit    None    Visit Diagnoses    Dysuria    -  Primary   Relevant Medications   ciprofloxacin (CIPRO) 500 MG tablet   Other Relevant Orders   Ambulatory referral to Urology   History of recurrent UTI (urinary tract infection)       -Suspect due to patient's mental illness she has poor adherence.    -  Patient was referred to urology prior visit. Will refer to urology again.    Relevant Orders   Ambulatory referral to Urology   History of falling       Relevant Orders   DG Lumbar Spine Complete   Basic Metabolic Panel (Completed)   CBC with Differential (Completed)   Acute midline low back pain without sciatica       Relevant Medications   ibuprofen (ADVIL,MOTRIN) 600 MG tablet   Frequent falls       Relevant Orders   Basic Metabolic Panel (Completed)   CBC with Differential (Completed)   Gait abnormality       Relevant Orders   Ambulatory referral to Neurology      Meds ordered this encounter  Medications  .  DISCONTD: ibuprofen (ADVIL,MOTRIN) 600 MG tablet    Sig: Take 1 tablet (600 mg total) by mouth every 8 (eight) hours as needed.    Dispense:  30 tablet    Refill:  0    Order Specific Question:   Supervising Provider    Answer:   Quentin AngstJEGEDE, OLUGBEMIGA E [id number]  . DISCONTD: ciprofloxacin (CIPRO) 500 MG tablet    Sig: Take 1 tablet (500 mg total) by mouth 2 (two) times daily.    Dispense:  28 tablet    Refill:  0    Order Specific Question:   Supervising Provider    Answer:   Quentin AngstJEGEDE, OLUGBEMIGA E [id number]  . DISCONTD: ciprofloxacin (CIPRO) 500 MG tablet    Sig: Take 1 tablet (500 mg total) by mouth 2 (two) times daily.    Dispense:  28 tablet    Refill:  0    Order Specific Question:   Supervising Provider    Answer:   Quentin AngstJEGEDE, OLUGBEMIGA E [id number]  . DISCONTD: ibuprofen (ADVIL,MOTRIN) 600 MG tablet    Sig: Take 1 tablet (600 mg total) by mouth every 8 (eight) hours as needed.    Dispense:  30 tablet    Refill:  0    Order Specific Question:   Supervising Provider    Answer:   Quentin AngstJEGEDE, OLUGBEMIGA E [id number]  . ciprofloxacin (CIPRO) 500 MG tablet    Sig: Take 1 tablet (500 mg total) by mouth 2 (two) times daily.    Dispense:  28 tablet    Refill:  0  . ibuprofen (ADVIL,MOTRIN) 600 MG tablet    Sig: Take 1 tablet (600 mg total) by mouth every 8 (eight) hours as needed.    Dispense:  30 tablet    Refill:  0        Lizbeth BarkMandesia R Kaiyah Eber FNP

## 2016-11-02 DIAGNOSIS — F250 Schizoaffective disorder, bipolar type: Secondary | ICD-10-CM | POA: Diagnosis not present

## 2016-11-07 ENCOUNTER — Telehealth: Payer: Self-pay

## 2016-11-07 NOTE — Telephone Encounter (Signed)
-----   Message from Lizbeth BarkMandesia R Hairston, FNP sent at 11/07/2016  4:56 PM EST ----- -Increase your dietary iron intake. Good sources of iron include dark green leafy vegetables, meats, beans, and iron fortified cereals. -Labs normal.

## 2016-11-07 NOTE — Telephone Encounter (Signed)
CMA call to go over lab results  Spoke with sister Rene KocherRegina  Patient sister was aware and understood her results

## 2016-11-13 ENCOUNTER — Telehealth: Payer: Self-pay | Admitting: *Deleted

## 2016-11-13 NOTE — Telephone Encounter (Signed)
Medical Assistant left message on patient's home and cell voicemail. Voicemail states to [place] [facility] [place] [facility] [place] [facility] [phone]. Patient is aware of x-ray of the spine being normal.

## 2016-11-13 NOTE — Telephone Encounter (Signed)
-----   Message from Lizbeth BarkMandesia R Hairston, FNP sent at 11/07/2016  5:56 PM EST ----- Lumbar x-ray of spine is normal.

## 2016-11-17 DIAGNOSIS — R350 Frequency of micturition: Secondary | ICD-10-CM | POA: Diagnosis not present

## 2016-11-17 DIAGNOSIS — N3946 Mixed incontinence: Secondary | ICD-10-CM | POA: Diagnosis not present

## 2016-11-17 DIAGNOSIS — N3944 Nocturnal enuresis: Secondary | ICD-10-CM | POA: Diagnosis not present

## 2016-11-21 DIAGNOSIS — H21563 Pupillary abnormality, bilateral: Secondary | ICD-10-CM | POA: Diagnosis not present

## 2016-11-21 DIAGNOSIS — H53002 Unspecified amblyopia, left eye: Secondary | ICD-10-CM | POA: Diagnosis not present

## 2016-11-27 ENCOUNTER — Ambulatory Visit (INDEPENDENT_AMBULATORY_CARE_PROVIDER_SITE_OTHER): Payer: Medicare Other | Admitting: Neurology

## 2016-11-27 ENCOUNTER — Encounter: Payer: Self-pay | Admitting: Neurology

## 2016-11-27 ENCOUNTER — Telehealth: Payer: Self-pay

## 2016-11-27 VITALS — BP 124/82 | HR 84 | Ht 64.0 in | Wt 182.6 lb

## 2016-11-27 DIAGNOSIS — W19XXXA Unspecified fall, initial encounter: Secondary | ICD-10-CM

## 2016-11-27 DIAGNOSIS — F0390 Unspecified dementia without behavioral disturbance: Secondary | ICD-10-CM

## 2016-11-27 DIAGNOSIS — R269 Unspecified abnormalities of gait and mobility: Secondary | ICD-10-CM

## 2016-11-27 DIAGNOSIS — F0281 Dementia in other diseases classified elsewhere with behavioral disturbance: Secondary | ICD-10-CM

## 2016-11-27 DIAGNOSIS — G300 Alzheimer's disease with early onset: Secondary | ICD-10-CM | POA: Diagnosis not present

## 2016-11-27 DIAGNOSIS — F0150 Vascular dementia without behavioral disturbance: Secondary | ICD-10-CM

## 2016-11-27 MED ORDER — DONEPEZIL HCL 10 MG PO TABS: 10.0000 mg | ORAL_TABLET | Freq: Every day | ORAL | 3 refills | Status: DC

## 2016-11-27 NOTE — Progress Notes (Addendum)
[facility]    Provider:  Dr Lucia Gaskins Referring Provider: Thomas Hoff* Primary Care Physician:  Arrie Senate, FNP   Interval history 11/27/2016: Patient was seen in September for memory changes. Today she is here for gait abnormality. Past medical history depression and anxiety, Schizophrenia (managed per daughter), tobacco use, hallucinations, headache. She is here with her mom and sister who provides much information. The memory is getting worse. Her gait is worsening, they feel since Lat July but when I saw her her gait appeared normal, they say she has some good days and PT. Will start he on some Aricept. She can;t get dressed by herself in the morning. She can't put her shoes on. She can;t dress anymore without assistance. She was taking her medications wrong before but now sister has guardianship. She has a distant cousin with dementia in her 23s. She can;t dress herself, difficulty with ADLs, need help with all IADLs. Slowly progressed over time. Ongoing for 4 or 5 years and slowly progressive. Discussed given MRI findings and progressive memory loss concerning for early onset dementia,  Needs an FDG PET CT.   Discussed MRI brain results which did show mesial temporal atrophy:  Mildly abnormal MRI brain (with and without) demonstrating: 1. Mild scattered periventricular and subcortical foci of non-specific gliosis.  2. 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 3. No acute findings.  EEG was slowed.     HPI:  Jody [name] is a 46 y.o. female here as a referral from Dr. Sharon Seller for altered mental status and memory changes. Past medical history depression and anxiety, Schizophrenia (managed per daughter), tobacco use, hallucinations, headache. She is here with her daughter who provides much information. She has been :zoning out" and bedwetting and at times unresponsive. Patient has schizophrenia and she hears  voices and has paranoia but she has had this for many years and she is well managed. Recently this year or longer her memory is declining, she can';t tell anyone the day, what time it is or what season it is. She can forget anything. She forgets that she has kids sometimes, forgets things are in her purse. Memories lost are both old and new. Started in July of this year, she has had 3-4 UTIs since July and she has confusion with the UTIs. She has been treated and no UTIs now. She is not responsive all the time. She stares. They find urine in the bed. There is rinigng in the ears. Motor skills are decreased, sometimes can't feed herself or wipe herself or brush her teeth unless they prompt her to do it, she gets frustrated and it comes and goes. She has mild tremors sometimes and that is difficult but sometimes her hands just fall like she has no control over it. She will pick up her sand which and then her hands will just fall and she drops the food. It is not the tremors, more loss of control of motor function or confusion on the steps needed. Her walking was bad in August but it has improved. She has been falling. She is confused since July. There has been stress in her life. She has been more paranoid this year but still her schizophrenia has been controlled for years and unclear why her memory would decline so substantially. She can;t cook, can't follow the steps. Symptoms progressive, nothing improves her memory, no other associated symptoms or modifying factors. Prior to this decline patient was able to make meals, drive  and now she can't.  No drug or alcohol use. She denies SI/intent. Unknown FHx of dementia.  Reviewed notes, labs and imaging from outside physicians, which showed:  BUN 7, creatinine 1.15 04/26/2016. TSH 6 09/28/2015.  Personally reviewed CT head and agree with the following: There is no evidence for acute hemorrhage, hydrocephalus, mass lesion, or abnormal extra-axial fluid  collection. No definite CT evidence for acute infarction. Prominence the lateral ventricles is stable in the interval. The visualized paranasal sinuses and mastoid air cells are clear.  ED 7/27: Patient admitted psychotic, walking around the room naked and not aware of the situation. Per her mother patient psychotic over the last several days. Patient's mother reportedthat on 7/14 when they were in [place] visiting family the patient began to have changes in behavior. She previously reportedpatient became confused, had intermittent episodes of being nonverbal and appeared to be a per stimulated when she reported that she was having auditory hallucinations. Mother also reports that patient began having intermittent episodes of urinary incontinence, right sided weakness and difficulty ambulating. The motherreportedpatient was evaluated in the ED in [place], was diagnosed with UTI and reported having negative stroke workup including labs and CT head. She was admitted and treated. Subsequently she displayed "normal behavior, thought process.Marland Kitchen.and displays clear thinking with a plan"  Review of Systems: Patient complains of symptoms per HPI as well as the following symptoms: Weight loss, shortness of breath, easy bruising, increased thirst, ringing in ears, trouble swallowing, aching muscles, urination problems, incontinence, memory loss, confusion, headache, weakness, slurred speech, sleepiness, depression, anxiety, too much sleep, decreased energy, change in appetite, disinterest in activities, suicidal thoughts, hallucinations, racing thoughts. Pertinent negatives per HPI. All others negative.   Social History   Social History  . Marital status: Divorced    Spouse name: N/A  . Number of children: 5  . Years of education: 12   Occupational History  . Disabled    Social History Main Topics  . Smoking status: Former Smoker    Packs/day: 0.25    Years: 4.00    Types: Cigarettes    Quit  date: 03/01/2015  . Smokeless tobacco: Never Used  . Alcohol use 0.0 oz/week     Comment: Quit 2017  . Drug use: No  . Sexual activity: Yes    Birth control/ protection: Condom   Other Topics Concern  . Not on file   Social History Narrative   Lives alone   Caffeine use: Soda- daily   Right-handed    Family History  Problem Relation Age of Onset  . Diabetes Father   . Hypertension Mother   . Hypertension Sister   . Lupus Sister   . Heart disease Maternal Grandfather   . Lupus      Past Medical History:  Diagnosis Date  . Anxiety and depression   . Bipolar disorder (HCC)   . Cystitis, interstitial   . Elevated troponin    a. 09/2015: CP/elevated troponin up to 0.11 - unclear significance. CT neg for PE, LHC neg for CAD, normal echo.  . Microcytic anemia    Noted on labs  . Panic attack   . Paranoid schizophrenia (HCC)   . Tobacco abuse     Past Surgical History:  Procedure Laterality Date  . CARDIAC CATHETERIZATION N/A 09/29/2015   Procedure: Left Heart Cath and Coronary Angiography;  Surgeon: Corky CraftsJayadeep S Varanasi, MD;  [facility];  Service: Cardiovascular;  Laterality: N/A;  . TUBAL LIGATION  Current Outpatient Prescriptions  Medication Sig Dispense Refill  . benztropine (COGENTIN) 1 MG tablet Take 1 mg by mouth at bedtime.     . ciprofloxacin (CIPRO) 500 MG tablet Take 500 mg by mouth 2 (two) times daily.    . divalproex (DEPAKOTE ER) 500 MG 24 hr tablet Take 1,500 mg by mouth at bedtime.     Marland Kitchen FLUoxetine (PROZAC) 40 MG capsule Take 40 mg by mouth daily.     Marland Kitchen lurasidone (LATUDA) 40 MG TABS tablet Take 40 mg by mouth daily with supper.     . traZODone (DESYREL) 100 MG tablet Take 1 tablet (100 mg total) by mouth at bedtime as needed for sleep. (Patient taking differently: Take 100 mg by mouth at bedtime as needed. ) 30 tablet 0  . donepezil (ARICEPT) 10 MG tablet Take 1 tablet (10 mg total) by mouth at bedtime. 30 tablet 3  . ibuprofen  (ADVIL,MOTRIN) 600 MG tablet Take 1 tablet (600 mg total) by mouth every 8 (eight) hours as needed. 30 tablet 0   No current facility-administered medications for this visit.     Allergies as of 11/27/2016 - Review Complete 11/27/2016  Allergen Reaction Noted  . Penicillins Itching and Rash 06/09/2012    Vitals: BP 124/82   Pulse 84   Ht 5\' 4"  (1.626 m)   Wt 182 lb 9.6 oz (82.8 kg)   BMI 31.34 kg/m  Last Weight:  Wt Readings from Last 1 Encounters:  11/27/16 182 lb 9.6 oz (82.8 kg)   Last Height:   Ht Readings from Last 1 Encounters:  11/27/16 5\' 4"  (1.626 m)   Physical exam: Exam: Gen: NAD, not conversant                    CV: RRR, no MRG. No Carotid Bruits. No peripheral edema, warm, nontender Eyes: Conjunctivae clear without exudates or hemorrhage  Neuro: Detailed Neurologic Exam  Speech:    Speech is normal; fluent, not spontaneous with impaired comprehension.  Cognition:  MMSE - Mini Mental State Exam 11/27/2016 06/28/2016 05/25/2016  Orientation to time 0 2 2  Orientation to Place 2 3 3   Registration 2 3 3   Attention/ Calculation 0 0 0  Recall 0 1 2  Language- name 2 objects 2 2 2   Language- repeat 1 1 0  Language- follow 3 step command 2 3 3   Language- read & follow direction 1 1 0  Write a sentence 0 1 0  Copy design 1 1 0  Total score 11 18 15      The patient is oriented to person, place, year and month    recent and remote memory impaired;     language fluent;     Impaired attention, concentration,     fund of knowledge impaired Cranial Nerves:    The pupils are equal, round, and reactive to light. Attempted fundoscopic exam could not visualize due to small pupils. Visual fields are full to finger confrontation. Extraocular movements are intact. Trigeminal sensation is intact and the muscles of mastication are normal. The face is symmetric. The palate elevates in the midline. Hearing intact. Voice is normal. Shoulder shrug is normal. The tongue has  normal motion without fasciculations.   Coordination:    Normal finger to nose and heel to shin but difficulty understanding instructions  Gait:    Imbalance and shuffling  Motor Observation: mild postural tremor    Tone:    Normal muscle tone.    Posture:  Posture is normal. normal erect    Strength:    Strength is V/V in the upper and lower limbs.      Sensation: intact to LT     Reflex Exam:  DTR's:    Deep tendon reflexes in the upper and lower extremities are normal bilaterally.   Toes:    The toes are downgoing bilaterally.   Clonus:    Clonus is absent.       Assessment/Plan:  46 year old female with schizophrenia with slowly progressive (over 4-5 years) impairment of memory and progressive decline. MMSE 11 today, 18 (06/2016), 15(Aug 2017), 26 (03/2015), 15(05/2016), 18(06/2016). Now living with sister who is guardian. Excessive lab testing was unremarkable. MRI brain showed moderate-severe mesial temporal atrophy worrisome for Alzheimer's type dementia.   Patient worsening, moved in with sister who is now guardian. She is taking her medications now that sister administers and still with memory decline. Given clinical progression and MRI findings of moderate-severe mesial temporal atrophy worrisome for early-onset  Alzheimer's type dementia especially given a (remote) family history of early-onset dementia. She is one of triplets and one of her sisters may be showing memory problems, unclear.   - Will send for formal neurocognitive testing, we appreciate Dr. Alinda Dooms as we know this evaluation will be difficult as to the etiology given her ongoing psychiatric illness but concomitant Alzheimers is possible given FHx of early onset dementia and her severe mesial temporal atrophy on MRI.    FDG PET Scan Formal neurocognitive testing [facility]: Nurse for medication management, Occupation therapy, Physical therapy at home for gait abnormality and falls but  also for home safety inspection, social worker for disease management education May need academic memory clinic to further pursue implication of early-onset dementia and genetic counseling of family. Start Aricept  Orders Placed This Encounter  Procedures  . NM PET Metabolic Brain  . Ambulatory referral to Neuropsychology  . Ambulatory referral to Home Health    CC: Anders Simmonds, [place]  Naomie Dean, MD  [facility] [address] [place], [place] [address]  Phone [phone] Fax [phone]  A total of 30 minutes was spent face-to-face with this patient. Over half this time was spent on counseling patient on the early-onset dementia diagnosis and different diagnostic and therapeutic options available.

## 2016-11-27 NOTE — Telephone Encounter (Signed)
[facility] referral form completed, awaiting MD signature.

## 2016-11-27 NOTE — Patient Instructions (Addendum)
Remember to drink plenty of fluid, eat healthy meals and do not skip any meals. Try to eat protein with a every meal and eat a healthy snack such as fruit or nuts in between meals. Try to keep a regular sleep-wake schedule and try to exercise daily, particularly in the form of walking, 20-30 minutes a day, if you can.   As far as your medications are concerned, I would like to suggest: Start Aricept  As far as diagnostic testing: FDG PET SCAN FOR DEMENTIA, neurocognitive formal testing, home PT/OT/Nursing and social work  I would like to see you back in 8 weeks, sooner if we need to. Please call us with any interim questions, concerns, problems, updates or refill requests.   Our phone number is [phone]. We also have an after hours call service for urgent matters and there is a physician on-call for urgent questions. For any emergencies you know to call 911 or go to the nearest emergency room  Donepezil tablets What is this medicine? DONEPEZIL (doe NEP e zil) is used to treat mild to moderate dementia caused by Alzheimer's disease. This medicine may be used for other purposes; ask your health care provider or pharmacist if you have questions. COMMON BRAND NAME(S): Aricept What should I tell my health care provider before I take this medicine? They need to know if you have any of these conditions: -asthma or other lung disease -difficulty passing urine -head injury -heart disease -history of irregular heartbeat -liver disease -seizures (convulsions) -stomach or intestinal disease, ulcers or stomach bleeding -an unusual or allergic reaction to donepezil, other medicines, foods, dyes, or preservatives -pregnant or trying to get pregnant -breast-feeding How should I use this medicine? Take this medicine by mouth with a glass of water. Follow the directions on the prescription label. You may take this medicine with or without food. Take this medicine at regular intervals. This medicine is  usually taken before bedtime. Do not take it more often than directed. Continue to take your medicine even if you feel better. Do not stop taking except on your doctor's advice. If you are taking the 23 mg donepezil tablet, swallow it whole; do not cut, crush, or chew it. Talk to your pediatrician regarding the use of this medicine in children. Special care may be needed. Overdosage: If you think you have taken too much of this medicine contact a poison control center or emergency room at once. NOTE: This medicine is only for you. Do not share this medicine with others. What if I miss a dose? If you miss a dose, take it as soon as you can. If it is almost time for your next dose, take only that dose, do not take double or extra doses. What may interact with this medicine? Do not take this medicine with any of the following medications: -certain medicines for fungal infections like itraconazole, fluconazole, posaconazole, and voriconazole -cisapride -dextromethorphan; quinidine -dofetilide -dronedarone -pimozide -quinidine -thioridazine -ziprasidone This medicine may also interact with the following medications: -antihistamines for allergy, cough and cold -atropine -bethanechol -carbamazepine -certain medicines for bladder problems like oxybutynin, tolterodine -certain medicines for Parkinson's disease like benztropine, trihexyphenidyl -certain medicines for stomach problems like dicyclomine, hyoscyamine -certain medicines for travel sickness like scopolamine -dexamethasone -ipratropium -NSAIDs, medicines for pain and inflammation, like ibuprofen or naproxen -other medicines for Alzheimer's disease -other medicines that prolong the QT interval (cause an abnormal heart rhythm) -phenobarbital -phenytoin -rifampin, rifabutin or rifapentine This list may not describe all possible interactions. Give your  health care provider a list of all the medicines, herbs, non-prescription drugs, or  dietary supplements you use. Also tell them if you smoke, drink alcohol, or use illegal drugs. Some items may interact with your medicine. What should I watch for while using this medicine? Visit your doctor or health care professional for regular checks on your progress. Check with your doctor or health care professional if your symptoms do not get better or if they get worse. You may get drowsy or dizzy. Do not drive, use machinery, or do anything that needs mental alertness until you know how this drug affects you. What side effects may I notice from receiving this medicine? Side effects that you should report to your doctor or health care professional as soon as possible: -allergic reactions like skin rash, itching or hives, swelling of the face, lips, or tongue -feeling faint or lightheaded, falls -loss of bladder control -seizures -signs and symptoms of a dangerous change in heartbeat or heart rhythm like chest pain; dizziness; fast or irregular heartbeat; palpitations; feeling faint or lightheaded, falls; breathing problems -signs and symptoms of infection like fever or chills; cough; sore throat; pain or trouble passing urine -signs and symptoms of liver injury like dark yellow or brown urine; general ill feeling or flu-like symptoms; light-colored stools; loss of appetite; nausea; right upper belly pain; unusually weak or tired; yellowing of the eyes or skin -slow heartbeat or palpitations -unusual bleeding or bruising -vomiting Side effects that usually do not require medical attention (report to your doctor or health care professional if they continue or are bothersome): -diarrhea, especially when starting treatment -headache -loss of appetite -muscle cramps -nausea -stomach upset This list may not describe all possible side effects. Call your doctor for medical advice about side effects. You may report side effects to FDA at 1-800-FDA-1088. Where should I keep my medicine? Keep  out of reach of children. Store at room temperature between 15 and 30 degrees C (59 and 86 degrees F). Throw away any unused medicine after the expiration date. NOTE: This sheet is a summary. It may not cover all possible information. If you have questions about this medicine, talk to your doctor, pharmacist, or health care provider.  2017 Elsevier/Gold Standard (2016-03-06 21:00:42)

## 2016-11-28 NOTE — Telephone Encounter (Signed)
Home health referral signed and faxed to [facility] F # [phone]. 

## 2016-11-29 DIAGNOSIS — D509 Iron deficiency anemia, unspecified: Secondary | ICD-10-CM | POA: Diagnosis not present

## 2016-11-29 DIAGNOSIS — R2689 Other abnormalities of gait and mobility: Secondary | ICD-10-CM | POA: Diagnosis not present

## 2016-11-29 DIAGNOSIS — F200 Paranoid schizophrenia: Secondary | ICD-10-CM | POA: Diagnosis not present

## 2016-11-29 DIAGNOSIS — G300 Alzheimer's disease with early onset: Secondary | ICD-10-CM | POA: Diagnosis not present

## 2016-11-29 DIAGNOSIS — F0281 Dementia in other diseases classified elsewhere with behavioral disturbance: Secondary | ICD-10-CM | POA: Diagnosis not present

## 2016-11-29 DIAGNOSIS — F419 Anxiety disorder, unspecified: Secondary | ICD-10-CM | POA: Diagnosis not present

## 2016-11-29 DIAGNOSIS — Z9181 History of falling: Secondary | ICD-10-CM | POA: Diagnosis not present

## 2016-11-29 DIAGNOSIS — F329 Major depressive disorder, single episode, unspecified: Secondary | ICD-10-CM | POA: Diagnosis not present

## 2016-11-29 DIAGNOSIS — Z87440 Personal history of urinary (tract) infections: Secondary | ICD-10-CM | POA: Diagnosis not present

## 2016-12-01 DIAGNOSIS — R2689 Other abnormalities of gait and mobility: Secondary | ICD-10-CM | POA: Diagnosis not present

## 2016-12-01 DIAGNOSIS — G300 Alzheimer's disease with early onset: Secondary | ICD-10-CM | POA: Diagnosis not present

## 2016-12-01 DIAGNOSIS — F329 Major depressive disorder, single episode, unspecified: Secondary | ICD-10-CM | POA: Diagnosis not present

## 2016-12-01 DIAGNOSIS — Z9181 History of falling: Secondary | ICD-10-CM | POA: Diagnosis not present

## 2016-12-01 DIAGNOSIS — F0281 Dementia in other diseases classified elsewhere with behavioral disturbance: Secondary | ICD-10-CM | POA: Diagnosis not present

## 2016-12-01 DIAGNOSIS — F200 Paranoid schizophrenia: Secondary | ICD-10-CM | POA: Diagnosis not present

## 2016-12-04 ENCOUNTER — Telehealth: Payer: Self-pay | Admitting: Neurology

## 2016-12-04 DIAGNOSIS — R2689 Other abnormalities of gait and mobility: Secondary | ICD-10-CM | POA: Diagnosis not present

## 2016-12-04 DIAGNOSIS — G300 Alzheimer's disease with early onset: Secondary | ICD-10-CM | POA: Diagnosis not present

## 2016-12-04 DIAGNOSIS — F329 Major depressive disorder, single episode, unspecified: Secondary | ICD-10-CM | POA: Diagnosis not present

## 2016-12-04 DIAGNOSIS — F0281 Dementia in other diseases classified elsewhere with behavioral disturbance: Secondary | ICD-10-CM | POA: Diagnosis not present

## 2016-12-04 DIAGNOSIS — F200 Paranoid schizophrenia: Secondary | ICD-10-CM | POA: Diagnosis not present

## 2016-12-04 DIAGNOSIS — Z9181 History of falling: Secondary | ICD-10-CM | POA: Diagnosis not present

## 2016-12-04 NOTE — Telephone Encounter (Signed)
Jody [name] (listed on DPR) called office in reference to needing a medical form completed (advised our office should have forms in office)  for patient thru [facility]  Nurse Glee ArvinLatoya came to the patients home this morning and notified Rene KocherRegina the form is needing to be completed with the dementia diagnosis, all four boxes at the bottom of the form completed, along with physicians signature.  Latoya's contact number [phone] fax # [phone].  Please call

## 2016-12-04 NOTE — Telephone Encounter (Signed)
Order from [facility] for stationary commode w/ fixed arm signed by MD and faxed back to [phone].

## 2016-12-05 DIAGNOSIS — F0281 Dementia in other diseases classified elsewhere with behavioral disturbance: Secondary | ICD-10-CM | POA: Diagnosis not present

## 2016-12-05 DIAGNOSIS — Z9181 History of falling: Secondary | ICD-10-CM | POA: Diagnosis not present

## 2016-12-05 DIAGNOSIS — R2689 Other abnormalities of gait and mobility: Secondary | ICD-10-CM | POA: Diagnosis not present

## 2016-12-05 DIAGNOSIS — G300 Alzheimer's disease with early onset: Secondary | ICD-10-CM | POA: Diagnosis not present

## 2016-12-05 DIAGNOSIS — F200 Paranoid schizophrenia: Secondary | ICD-10-CM | POA: Diagnosis not present

## 2016-12-05 DIAGNOSIS — F329 Major depressive disorder, single episode, unspecified: Secondary | ICD-10-CM | POA: Diagnosis not present

## 2016-12-06 DIAGNOSIS — R2689 Other abnormalities of gait and mobility: Secondary | ICD-10-CM | POA: Diagnosis not present

## 2016-12-06 DIAGNOSIS — F329 Major depressive disorder, single episode, unspecified: Secondary | ICD-10-CM | POA: Diagnosis not present

## 2016-12-06 DIAGNOSIS — F200 Paranoid schizophrenia: Secondary | ICD-10-CM | POA: Diagnosis not present

## 2016-12-06 DIAGNOSIS — Z9181 History of falling: Secondary | ICD-10-CM | POA: Diagnosis not present

## 2016-12-06 DIAGNOSIS — G300 Alzheimer's disease with early onset: Secondary | ICD-10-CM | POA: Diagnosis not present

## 2016-12-06 DIAGNOSIS — F0281 Dementia in other diseases classified elsewhere with behavioral disturbance: Secondary | ICD-10-CM | POA: Diagnosis not present

## 2016-12-07 ENCOUNTER — Ambulatory Visit (HOSPITAL_COMMUNITY)
Admission: RE | Admit: 2016-12-07 | Discharge: 2016-12-07 | Disposition: A | Discharge status: Home / Self Care | Payer: Medicare Other | Source: Ambulatory Visit | Attending: Neurology | Admitting: Neurology

## 2016-12-07 ENCOUNTER — Telehealth: Payer: Self-pay | Admitting: *Deleted

## 2016-12-07 ENCOUNTER — Telehealth: Payer: Self-pay | Admitting: Neurology

## 2016-12-07 DIAGNOSIS — R4689 Other symptoms and signs involving appearance and behavior: Secondary | ICD-10-CM | POA: Diagnosis not present

## 2016-12-07 DIAGNOSIS — G300 Alzheimer's disease with early onset: Secondary | ICD-10-CM | POA: Insufficient documentation

## 2016-12-07 DIAGNOSIS — G3189 Other specified degenerative diseases of nervous system: Secondary | ICD-10-CM | POA: Diagnosis not present

## 2016-12-07 DIAGNOSIS — F0281 Dementia in other diseases classified elsewhere with behavioral disturbance: Secondary | ICD-10-CM | POA: Insufficient documentation

## 2016-12-07 DIAGNOSIS — F02818 Dementia in other diseases classified elsewhere, unspecified severity, with other behavioral disturbance: Secondary | ICD-10-CM

## 2016-12-07 MED ORDER — FLUDEOXYGLUCOSE F - 18 (FDG) INJECTION
9.9300 | Freq: Once | INTRAVENOUS | Status: AC | PRN
  Administered 2016-12-07: 9.93 via INTRAVENOUS

## 2016-12-07 NOTE — Telephone Encounter (Signed)
LVM requesting call back re: scan results.

## 2016-12-07 NOTE — Telephone Encounter (Signed)
Per Dr Lucia GaskinsAhern, spoke with patient's guardian, Rene KocherRegina and informed her that the patient's PETscan did not show evidence of dementia. However this does not rule out dementia, as this test can be false in a percentage of people who really do have dementia.  Jody [name] verbalized understanding, appreciation for call.

## 2016-12-07 NOTE — Telephone Encounter (Signed)
Called Latoya w/ [facility] [place] to ask that requested form be faxed to us. Latoya did pt's recent assessment but does not currently have fax available. [place] in [place] [phone] but meanwhile found forms online. Verified correct form which was printed, completed and awaiting MD signature.

## 2016-12-07 NOTE — Telephone Encounter (Signed)
Diane Acupuncturistccupational Therapist with [facility] called requesting verbal orders for OT 2 times weekly for 4 weeks.  Please call

## 2016-12-08 DIAGNOSIS — G300 Alzheimer's disease with early onset: Secondary | ICD-10-CM | POA: Diagnosis not present

## 2016-12-08 DIAGNOSIS — R2689 Other abnormalities of gait and mobility: Secondary | ICD-10-CM | POA: Diagnosis not present

## 2016-12-08 DIAGNOSIS — F329 Major depressive disorder, single episode, unspecified: Secondary | ICD-10-CM | POA: Diagnosis not present

## 2016-12-08 DIAGNOSIS — Z9181 History of falling: Secondary | ICD-10-CM | POA: Diagnosis not present

## 2016-12-08 DIAGNOSIS — F200 Paranoid schizophrenia: Secondary | ICD-10-CM | POA: Diagnosis not present

## 2016-12-08 DIAGNOSIS — F0281 Dementia in other diseases classified elsewhere with behavioral disturbance: Secondary | ICD-10-CM | POA: Diagnosis not present

## 2016-12-08 NOTE — Telephone Encounter (Signed)
Returned call to Diane w/ [facility]. Gave VO for OT 2x/wk x 4 wks.

## 2016-12-11 DIAGNOSIS — F0281 Dementia in other diseases classified elsewhere with behavioral disturbance: Secondary | ICD-10-CM | POA: Diagnosis not present

## 2016-12-11 DIAGNOSIS — F200 Paranoid schizophrenia: Secondary | ICD-10-CM | POA: Diagnosis not present

## 2016-12-11 DIAGNOSIS — Z9181 History of falling: Secondary | ICD-10-CM | POA: Diagnosis not present

## 2016-12-11 DIAGNOSIS — G300 Alzheimer's disease with early onset: Secondary | ICD-10-CM | POA: Diagnosis not present

## 2016-12-11 DIAGNOSIS — R2689 Other abnormalities of gait and mobility: Secondary | ICD-10-CM | POA: Diagnosis not present

## 2016-12-11 DIAGNOSIS — F329 Major depressive disorder, single episode, unspecified: Secondary | ICD-10-CM | POA: Diagnosis not present

## 2016-12-11 NOTE — Telephone Encounter (Signed)
PCS forms signed and faxed back to [facility] F # [phone].

## 2016-12-13 DIAGNOSIS — Z9181 History of falling: Secondary | ICD-10-CM | POA: Diagnosis not present

## 2016-12-13 DIAGNOSIS — F329 Major depressive disorder, single episode, unspecified: Secondary | ICD-10-CM | POA: Diagnosis not present

## 2016-12-13 DIAGNOSIS — G300 Alzheimer's disease with early onset: Secondary | ICD-10-CM | POA: Diagnosis not present

## 2016-12-13 DIAGNOSIS — F0281 Dementia in other diseases classified elsewhere with behavioral disturbance: Secondary | ICD-10-CM | POA: Diagnosis not present

## 2016-12-13 DIAGNOSIS — F200 Paranoid schizophrenia: Secondary | ICD-10-CM | POA: Diagnosis not present

## 2016-12-13 DIAGNOSIS — R2689 Other abnormalities of gait and mobility: Secondary | ICD-10-CM | POA: Diagnosis not present

## 2016-12-14 DIAGNOSIS — Z9181 History of falling: Secondary | ICD-10-CM | POA: Diagnosis not present

## 2016-12-14 DIAGNOSIS — F200 Paranoid schizophrenia: Secondary | ICD-10-CM | POA: Diagnosis not present

## 2016-12-14 DIAGNOSIS — G300 Alzheimer's disease with early onset: Secondary | ICD-10-CM | POA: Diagnosis not present

## 2016-12-14 DIAGNOSIS — F329 Major depressive disorder, single episode, unspecified: Secondary | ICD-10-CM | POA: Diagnosis not present

## 2016-12-14 DIAGNOSIS — F0281 Dementia in other diseases classified elsewhere with behavioral disturbance: Secondary | ICD-10-CM | POA: Diagnosis not present

## 2016-12-14 DIAGNOSIS — R2689 Other abnormalities of gait and mobility: Secondary | ICD-10-CM | POA: Diagnosis not present

## 2016-12-14 NOTE — Telephone Encounter (Signed)
Order for transfer bench signed and [facility] # [phone].

## 2016-12-18 DIAGNOSIS — F329 Major depressive disorder, single episode, unspecified: Secondary | ICD-10-CM | POA: Diagnosis not present

## 2016-12-18 DIAGNOSIS — F200 Paranoid schizophrenia: Secondary | ICD-10-CM | POA: Diagnosis not present

## 2016-12-18 DIAGNOSIS — R2689 Other abnormalities of gait and mobility: Secondary | ICD-10-CM | POA: Diagnosis not present

## 2016-12-18 DIAGNOSIS — G300 Alzheimer's disease with early onset: Secondary | ICD-10-CM | POA: Diagnosis not present

## 2016-12-18 DIAGNOSIS — F0281 Dementia in other diseases classified elsewhere with behavioral disturbance: Secondary | ICD-10-CM | POA: Diagnosis not present

## 2016-12-18 DIAGNOSIS — Z9181 History of falling: Secondary | ICD-10-CM | POA: Diagnosis not present

## 2016-12-20 DIAGNOSIS — Z9181 History of falling: Secondary | ICD-10-CM | POA: Diagnosis not present

## 2016-12-20 DIAGNOSIS — F200 Paranoid schizophrenia: Secondary | ICD-10-CM | POA: Diagnosis not present

## 2016-12-20 DIAGNOSIS — F329 Major depressive disorder, single episode, unspecified: Secondary | ICD-10-CM | POA: Diagnosis not present

## 2016-12-20 DIAGNOSIS — F0281 Dementia in other diseases classified elsewhere with behavioral disturbance: Secondary | ICD-10-CM | POA: Diagnosis not present

## 2016-12-20 DIAGNOSIS — G300 Alzheimer's disease with early onset: Secondary | ICD-10-CM | POA: Diagnosis not present

## 2016-12-20 DIAGNOSIS — R2689 Other abnormalities of gait and mobility: Secondary | ICD-10-CM | POA: Diagnosis not present

## 2016-12-21 DIAGNOSIS — N3944 Nocturnal enuresis: Secondary | ICD-10-CM | POA: Diagnosis not present

## 2016-12-21 DIAGNOSIS — N3946 Mixed incontinence: Secondary | ICD-10-CM | POA: Diagnosis not present

## 2016-12-21 DIAGNOSIS — R311 Benign essential microscopic hematuria: Secondary | ICD-10-CM | POA: Diagnosis not present

## 2016-12-25 DIAGNOSIS — F0281 Dementia in other diseases classified elsewhere with behavioral disturbance: Secondary | ICD-10-CM | POA: Diagnosis not present

## 2016-12-25 DIAGNOSIS — R2689 Other abnormalities of gait and mobility: Secondary | ICD-10-CM | POA: Diagnosis not present

## 2016-12-25 DIAGNOSIS — F200 Paranoid schizophrenia: Secondary | ICD-10-CM | POA: Diagnosis not present

## 2016-12-25 DIAGNOSIS — Z9181 History of falling: Secondary | ICD-10-CM | POA: Diagnosis not present

## 2016-12-25 DIAGNOSIS — F329 Major depressive disorder, single episode, unspecified: Secondary | ICD-10-CM | POA: Diagnosis not present

## 2016-12-25 DIAGNOSIS — G300 Alzheimer's disease with early onset: Secondary | ICD-10-CM | POA: Diagnosis not present

## 2016-12-26 ENCOUNTER — Telehealth: Payer: Self-pay | Admitting: Neurology

## 2016-12-26 NOTE — Telephone Encounter (Signed)
Diane with [facility] called to get verbal orders to continue occupational therapy 2 times weekly for 2 weeks starting next week.  Please call

## 2016-12-27 DIAGNOSIS — F200 Paranoid schizophrenia: Secondary | ICD-10-CM | POA: Diagnosis not present

## 2016-12-27 DIAGNOSIS — F0281 Dementia in other diseases classified elsewhere with behavioral disturbance: Secondary | ICD-10-CM | POA: Diagnosis not present

## 2016-12-27 DIAGNOSIS — R2689 Other abnormalities of gait and mobility: Secondary | ICD-10-CM | POA: Diagnosis not present

## 2016-12-27 DIAGNOSIS — F329 Major depressive disorder, single episode, unspecified: Secondary | ICD-10-CM | POA: Diagnosis not present

## 2016-12-27 DIAGNOSIS — Z9181 History of falling: Secondary | ICD-10-CM | POA: Diagnosis not present

## 2016-12-27 DIAGNOSIS — G300 Alzheimer's disease with early onset: Secondary | ICD-10-CM | POA: Diagnosis not present

## 2016-12-27 NOTE — Telephone Encounter (Signed)
Returned call to Diane @ [facility] and gave VO to continue OT 2x/wk x 2. Verbalized understanding and appreciation for call.

## 2016-12-28 ENCOUNTER — Telehealth: Payer: Self-pay | Admitting: Neurology

## 2016-12-28 NOTE — Telephone Encounter (Signed)
Jody [name] with [facility] called requesting verbal for physical therapy 2 times weekly for 3 weeks patient is showing good improvement.  Please call

## 2016-12-28 NOTE — Telephone Encounter (Signed)
Returned call w/ VO to continue PT 2x/wk x 3 wks. May call back w/ any questions/concerns.

## 2017-01-01 DIAGNOSIS — Z9181 History of falling: Secondary | ICD-10-CM | POA: Diagnosis not present

## 2017-01-01 DIAGNOSIS — F0281 Dementia in other diseases classified elsewhere with behavioral disturbance: Secondary | ICD-10-CM | POA: Diagnosis not present

## 2017-01-01 DIAGNOSIS — F200 Paranoid schizophrenia: Secondary | ICD-10-CM | POA: Diagnosis not present

## 2017-01-01 DIAGNOSIS — G300 Alzheimer's disease with early onset: Secondary | ICD-10-CM | POA: Diagnosis not present

## 2017-01-01 DIAGNOSIS — F329 Major depressive disorder, single episode, unspecified: Secondary | ICD-10-CM | POA: Diagnosis not present

## 2017-01-01 DIAGNOSIS — R2689 Other abnormalities of gait and mobility: Secondary | ICD-10-CM | POA: Diagnosis not present

## 2017-01-02 ENCOUNTER — Encounter: Payer: Self-pay | Admitting: Psychology

## 2017-01-04 DIAGNOSIS — G300 Alzheimer's disease with early onset: Secondary | ICD-10-CM | POA: Diagnosis not present

## 2017-01-04 DIAGNOSIS — Z9181 History of falling: Secondary | ICD-10-CM | POA: Diagnosis not present

## 2017-01-04 DIAGNOSIS — F329 Major depressive disorder, single episode, unspecified: Secondary | ICD-10-CM | POA: Diagnosis not present

## 2017-01-04 DIAGNOSIS — F200 Paranoid schizophrenia: Secondary | ICD-10-CM | POA: Diagnosis not present

## 2017-01-04 DIAGNOSIS — F0281 Dementia in other diseases classified elsewhere with behavioral disturbance: Secondary | ICD-10-CM | POA: Diagnosis not present

## 2017-01-04 DIAGNOSIS — R2689 Other abnormalities of gait and mobility: Secondary | ICD-10-CM | POA: Diagnosis not present

## 2017-01-04 NOTE — Telephone Encounter (Signed)
Diane called to advise the pt missed a visit this week, said she had a tooth that is causing her pain. They will start back up next week   FYI

## 2017-01-08 ENCOUNTER — Encounter: Payer: Self-pay | Admitting: Psychology

## 2017-01-08 DIAGNOSIS — F0281 Dementia in other diseases classified elsewhere with behavioral disturbance: Secondary | ICD-10-CM | POA: Diagnosis not present

## 2017-01-08 DIAGNOSIS — F329 Major depressive disorder, single episode, unspecified: Secondary | ICD-10-CM | POA: Diagnosis not present

## 2017-01-08 DIAGNOSIS — R2689 Other abnormalities of gait and mobility: Secondary | ICD-10-CM | POA: Diagnosis not present

## 2017-01-08 DIAGNOSIS — G300 Alzheimer's disease with early onset: Secondary | ICD-10-CM | POA: Diagnosis not present

## 2017-01-08 DIAGNOSIS — Z9181 History of falling: Secondary | ICD-10-CM | POA: Diagnosis not present

## 2017-01-08 DIAGNOSIS — F200 Paranoid schizophrenia: Secondary | ICD-10-CM | POA: Diagnosis not present

## 2017-01-10 DIAGNOSIS — F0281 Dementia in other diseases classified elsewhere with behavioral disturbance: Secondary | ICD-10-CM | POA: Diagnosis not present

## 2017-01-10 DIAGNOSIS — Z9181 History of falling: Secondary | ICD-10-CM | POA: Diagnosis not present

## 2017-01-10 DIAGNOSIS — F200 Paranoid schizophrenia: Secondary | ICD-10-CM | POA: Diagnosis not present

## 2017-01-10 DIAGNOSIS — R2689 Other abnormalities of gait and mobility: Secondary | ICD-10-CM | POA: Diagnosis not present

## 2017-01-10 DIAGNOSIS — F329 Major depressive disorder, single episode, unspecified: Secondary | ICD-10-CM | POA: Diagnosis not present

## 2017-01-10 DIAGNOSIS — G300 Alzheimer's disease with early onset: Secondary | ICD-10-CM | POA: Diagnosis not present

## 2017-01-15 DIAGNOSIS — F329 Major depressive disorder, single episode, unspecified: Secondary | ICD-10-CM | POA: Diagnosis not present

## 2017-01-15 DIAGNOSIS — F0281 Dementia in other diseases classified elsewhere with behavioral disturbance: Secondary | ICD-10-CM | POA: Diagnosis not present

## 2017-01-15 DIAGNOSIS — Z9181 History of falling: Secondary | ICD-10-CM | POA: Diagnosis not present

## 2017-01-15 DIAGNOSIS — G300 Alzheimer's disease with early onset: Secondary | ICD-10-CM | POA: Diagnosis not present

## 2017-01-15 DIAGNOSIS — F200 Paranoid schizophrenia: Secondary | ICD-10-CM | POA: Diagnosis not present

## 2017-01-15 DIAGNOSIS — R2689 Other abnormalities of gait and mobility: Secondary | ICD-10-CM | POA: Diagnosis not present

## 2017-01-17 DIAGNOSIS — F200 Paranoid schizophrenia: Secondary | ICD-10-CM | POA: Diagnosis not present

## 2017-01-17 DIAGNOSIS — Z9181 History of falling: Secondary | ICD-10-CM | POA: Diagnosis not present

## 2017-01-17 DIAGNOSIS — G300 Alzheimer's disease with early onset: Secondary | ICD-10-CM | POA: Diagnosis not present

## 2017-01-17 DIAGNOSIS — R2689 Other abnormalities of gait and mobility: Secondary | ICD-10-CM | POA: Diagnosis not present

## 2017-01-17 DIAGNOSIS — F329 Major depressive disorder, single episode, unspecified: Secondary | ICD-10-CM | POA: Diagnosis not present

## 2017-01-17 DIAGNOSIS — F0281 Dementia in other diseases classified elsewhere with behavioral disturbance: Secondary | ICD-10-CM | POA: Diagnosis not present

## 2017-01-18 DIAGNOSIS — F329 Major depressive disorder, single episode, unspecified: Secondary | ICD-10-CM | POA: Diagnosis not present

## 2017-01-18 DIAGNOSIS — R2689 Other abnormalities of gait and mobility: Secondary | ICD-10-CM | POA: Diagnosis not present

## 2017-01-18 DIAGNOSIS — F0281 Dementia in other diseases classified elsewhere with behavioral disturbance: Secondary | ICD-10-CM | POA: Diagnosis not present

## 2017-01-18 DIAGNOSIS — F200 Paranoid schizophrenia: Secondary | ICD-10-CM | POA: Diagnosis not present

## 2017-01-18 DIAGNOSIS — G300 Alzheimer's disease with early onset: Secondary | ICD-10-CM | POA: Diagnosis not present

## 2017-01-18 DIAGNOSIS — Z9181 History of falling: Secondary | ICD-10-CM | POA: Diagnosis not present

## 2017-01-22 ENCOUNTER — Encounter: Payer: Self-pay | Admitting: Psychology

## 2017-01-22 DIAGNOSIS — F0281 Dementia in other diseases classified elsewhere with behavioral disturbance: Secondary | ICD-10-CM | POA: Diagnosis not present

## 2017-01-22 DIAGNOSIS — G300 Alzheimer's disease with early onset: Secondary | ICD-10-CM | POA: Diagnosis not present

## 2017-01-22 DIAGNOSIS — F200 Paranoid schizophrenia: Secondary | ICD-10-CM | POA: Diagnosis not present

## 2017-01-22 DIAGNOSIS — R2689 Other abnormalities of gait and mobility: Secondary | ICD-10-CM | POA: Diagnosis not present

## 2017-01-22 DIAGNOSIS — F329 Major depressive disorder, single episode, unspecified: Secondary | ICD-10-CM | POA: Diagnosis not present

## 2017-01-22 DIAGNOSIS — Z9181 History of falling: Secondary | ICD-10-CM | POA: Diagnosis not present

## 2017-01-23 ENCOUNTER — Telehealth: Payer: Self-pay | Admitting: Neurology

## 2017-01-23 NOTE — Telephone Encounter (Signed)
Returned call to [place], [facility] RN, and gave verbal approval for additional SN visit. Verbalized understanding. Mentioned that PT may call for an extension, as well, depending on pt assessment after Risperdal. Voiced appreciation for call.

## 2017-01-23 NOTE — Telephone Encounter (Signed)
RN French Ana from [facility] calling requesting an approval for one more visit due to last visit to pt she had an adverse reaction to Risperidone(from her psychiatrist) . The dosage has been changed and she just wants to check her one last time.  She is asking to be called at [phone]

## 2017-01-24 DIAGNOSIS — F200 Paranoid schizophrenia: Secondary | ICD-10-CM | POA: Diagnosis not present

## 2017-01-24 DIAGNOSIS — F329 Major depressive disorder, single episode, unspecified: Secondary | ICD-10-CM | POA: Diagnosis not present

## 2017-01-24 DIAGNOSIS — R2689 Other abnormalities of gait and mobility: Secondary | ICD-10-CM | POA: Diagnosis not present

## 2017-01-24 DIAGNOSIS — Z9181 History of falling: Secondary | ICD-10-CM | POA: Diagnosis not present

## 2017-01-24 DIAGNOSIS — G300 Alzheimer's disease with early onset: Secondary | ICD-10-CM | POA: Diagnosis not present

## 2017-01-24 DIAGNOSIS — F0281 Dementia in other diseases classified elsewhere with behavioral disturbance: Secondary | ICD-10-CM | POA: Diagnosis not present

## 2017-01-24 NOTE — Telephone Encounter (Signed)
I don;t manage her blood pressure, please ask them to call her primary care thanks

## 2017-01-24 NOTE — Telephone Encounter (Signed)
Jody [name] with [facility] is calling to get a verbal for extension of PT for 2 times weekly for 4 weeks.  Please call

## 2017-01-24 NOTE — Telephone Encounter (Signed)
Jody [name] with [facility] called again stating patients BP readings are 138/105 and heart rate was 117.

## 2017-01-24 NOTE — Telephone Encounter (Signed)
Returned call to [place], PT w/ [facility], and gave ok for extension of PT visits. Reports that pt's mobility has improved quite a bit. Her BP and HR were elevated prior to therapy session today. SN visit planned for tomorrow and RN will re-check VS. Also, [facility] is working w/ Vesta Mixer on pt's psych meds. Staff/sister report increased anxiety in pt which could be contributing to elevated VS. Voiced appreciation for call.

## 2017-01-24 NOTE — Telephone Encounter (Signed)
error 

## 2017-01-25 ENCOUNTER — Telehealth: Payer: Self-pay | Admitting: Family Medicine

## 2017-01-25 DIAGNOSIS — F0281 Dementia in other diseases classified elsewhere with behavioral disturbance: Secondary | ICD-10-CM | POA: Diagnosis not present

## 2017-01-25 DIAGNOSIS — R2689 Other abnormalities of gait and mobility: Secondary | ICD-10-CM | POA: Diagnosis not present

## 2017-01-25 DIAGNOSIS — F200 Paranoid schizophrenia: Secondary | ICD-10-CM | POA: Diagnosis not present

## 2017-01-25 DIAGNOSIS — F329 Major depressive disorder, single episode, unspecified: Secondary | ICD-10-CM | POA: Diagnosis not present

## 2017-01-25 DIAGNOSIS — G300 Alzheimer's disease with early onset: Secondary | ICD-10-CM | POA: Diagnosis not present

## 2017-01-25 DIAGNOSIS — Z9181 History of falling: Secondary | ICD-10-CM | POA: Diagnosis not present

## 2017-01-25 NOTE — Telephone Encounter (Signed)
Patient would need to come in to the office for further evaluation. Ask patient if she is able to come in tomorrow am. Add to my schedule.

## 2017-01-25 NOTE — Telephone Encounter (Signed)
Kizzie Ide, RN w/ [facility], to be sure that she was going out to re-eval pt's BP today. Said that [name] was her next pt. Agreed to refer pt to PCP if BP remains elevated. Also gave VO to re-cert SN visits along w/ PT extension. Voiced appreciation for call.

## 2017-01-25 NOTE — Telephone Encounter (Signed)
Tracy, nurse with [facility] calling to inform PCP that the patient was seen a couple times this week by nurse and physical therapist and patient's BP and heart beat has been elevated  Reports BP was 134/96 and pulse 100 at rest. Patient's heart beat is usually in the 70's Yesterday patients BP was 138/105 with a pulse of 117 States patient is not on cardiac medications or BP medications  Other than elevated BP and heart beat, states patient was asymptomatic  

## 2017-01-25 NOTE — Telephone Encounter (Signed)
French Ana, nurse with [facility] calling to inform PCP that the patient was seen a couple times this week by nurse and physical therapist and patient's BP and heart beat has been elevated  Reports BP was 134/96 and pulse 100 at rest. Patient's heart beat is usually in the 70's Yesterday patients BP was 138/105 with a pulse of 117 States patient is not on cardiac medications or BP medications  Other than elevated BP and heart beat, states patient was asymptomatic

## 2017-01-28 DIAGNOSIS — F329 Major depressive disorder, single episode, unspecified: Secondary | ICD-10-CM | POA: Diagnosis not present

## 2017-01-28 DIAGNOSIS — G300 Alzheimer's disease with early onset: Secondary | ICD-10-CM | POA: Diagnosis not present

## 2017-01-28 DIAGNOSIS — F0281 Dementia in other diseases classified elsewhere with behavioral disturbance: Secondary | ICD-10-CM | POA: Diagnosis not present

## 2017-01-28 DIAGNOSIS — R2689 Other abnormalities of gait and mobility: Secondary | ICD-10-CM | POA: Diagnosis not present

## 2017-01-28 DIAGNOSIS — Z9181 History of falling: Secondary | ICD-10-CM | POA: Diagnosis not present

## 2017-01-28 DIAGNOSIS — Z87440 Personal history of urinary (tract) infections: Secondary | ICD-10-CM | POA: Diagnosis not present

## 2017-01-28 DIAGNOSIS — F419 Anxiety disorder, unspecified: Secondary | ICD-10-CM | POA: Diagnosis not present

## 2017-01-28 DIAGNOSIS — F200 Paranoid schizophrenia: Secondary | ICD-10-CM | POA: Diagnosis not present

## 2017-01-28 DIAGNOSIS — D509 Iron deficiency anemia, unspecified: Secondary | ICD-10-CM | POA: Diagnosis not present

## 2017-01-29 ENCOUNTER — Telehealth: Payer: Self-pay | Admitting: Neurology

## 2017-01-29 ENCOUNTER — Telehealth: Payer: Self-pay | Admitting: Family Medicine

## 2017-01-29 DIAGNOSIS — F0281 Dementia in other diseases classified elsewhere with behavioral disturbance: Secondary | ICD-10-CM | POA: Diagnosis not present

## 2017-01-29 DIAGNOSIS — R2689 Other abnormalities of gait and mobility: Secondary | ICD-10-CM | POA: Diagnosis not present

## 2017-01-29 DIAGNOSIS — Z9181 History of falling: Secondary | ICD-10-CM | POA: Diagnosis not present

## 2017-01-29 DIAGNOSIS — F200 Paranoid schizophrenia: Secondary | ICD-10-CM | POA: Diagnosis not present

## 2017-01-29 DIAGNOSIS — G300 Alzheimer's disease with early onset: Secondary | ICD-10-CM | POA: Diagnosis not present

## 2017-01-29 DIAGNOSIS — F329 Major depressive disorder, single episode, unspecified: Secondary | ICD-10-CM | POA: Diagnosis not present

## 2017-01-29 NOTE — Telephone Encounter (Signed)
Previous note was sent requesting patient come in to be evaluated for high BP's. Please contact patient to determine availability.Then add to my schedule first available appointment for this week.

## 2017-01-29 NOTE — Telephone Encounter (Signed)
Patient would need to come in to the office for further evaluation.  Add to my schedule.

## 2017-01-29 NOTE — Telephone Encounter (Signed)
Caller calling to advise the pt had elevated systolic bp of 130/high 90's at rest. Asymptomatic, no headaches, no blurred vision, just trending higher than normal. Please

## 2017-01-29 NOTE — Telephone Encounter (Signed)
Appt made for 02/01/17.

## 2017-01-30 ENCOUNTER — Ambulatory Visit (INDEPENDENT_AMBULATORY_CARE_PROVIDER_SITE_OTHER): Payer: Medicare Other | Admitting: Psychology

## 2017-01-30 ENCOUNTER — Encounter: Payer: Self-pay | Admitting: Psychology

## 2017-01-30 DIAGNOSIS — R413 Other amnesia: Secondary | ICD-10-CM

## 2017-01-30 DIAGNOSIS — F200 Paranoid schizophrenia: Secondary | ICD-10-CM | POA: Diagnosis not present
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
## 2017-01-30 NOTE — Progress Notes (Signed)
   Neuropsychology Note  Jody Jody came in today for 1 hour of neuropsychological testing with technician, Jody Jody, BS, under the supervision of Dr. Elvis Coil. The patient did appear anxious by the testing  particularly at the end of the session per behavioral observation or via self-report to the technician. Rest breaks were offered. Jody Jody will return within 2 weeks for a feedback session with Dr. Alinda Dooms at which time her test performances, clinical impressions and treatment recommendations will be reviewed in detail. The patient understands she can contact our office should she require our assistance before this time.  Full report to follow.

## 2017-01-31 ENCOUNTER — Ambulatory Visit (INDEPENDENT_AMBULATORY_CARE_PROVIDER_SITE_OTHER): Payer: Medicare Other | Admitting: Neurology

## 2017-01-31 ENCOUNTER — Encounter: Payer: Self-pay | Admitting: Neurology

## 2017-01-31 VITALS — BP 131/93 | HR 76 | Ht 64.0 in | Wt 178.6 lb

## 2017-01-31 DIAGNOSIS — F0390 Unspecified dementia without behavioral disturbance: Secondary | ICD-10-CM

## 2017-01-31 DIAGNOSIS — G300 Alzheimer's disease with early onset: Secondary | ICD-10-CM | POA: Diagnosis not present

## 2017-01-31 DIAGNOSIS — Z9181 History of falling: Secondary | ICD-10-CM | POA: Diagnosis not present

## 2017-01-31 DIAGNOSIS — F200 Paranoid schizophrenia: Secondary | ICD-10-CM | POA: Diagnosis not present

## 2017-01-31 DIAGNOSIS — F0281 Dementia in other diseases classified elsewhere with behavioral disturbance: Secondary | ICD-10-CM | POA: Diagnosis not present

## 2017-01-31 DIAGNOSIS — R2689 Other abnormalities of gait and mobility: Secondary | ICD-10-CM | POA: Diagnosis not present

## 2017-01-31 DIAGNOSIS — F329 Major depressive disorder, single episode, unspecified: Secondary | ICD-10-CM | POA: Diagnosis not present

## 2017-01-31 DIAGNOSIS — F0150 Vascular dementia without behavioral disturbance: Secondary | ICD-10-CM

## 2017-01-31 MED ORDER — DONEPEZIL HCL 10 MG PO TABS: 10.0000 mg | ORAL_TABLET | Freq: Two times a day (BID) | ORAL | 11 refills | Status: DC

## 2017-01-31 NOTE — Patient Instructions (Signed)
Remember to drink plenty of fluid, eat healthy meals and do not skip any meals. Try to eat protein with a every meal and eat a healthy snack such as fruit or nuts in between meals. Try to keep a regular sleep-wake schedule and try to exercise daily, particularly in the form of walking, 20-30 minutes a day, if you can.   As far as your medications are concerned, I would like to suggest: Increase Aricept to twice daily  As far as diagnostic testing: Will wait for the formal neurocognitive report  I would like to see you back in 6 months, sooner if we need to. Please call us with any interim questions, concerns, problems, updates or refill requests.   Our phone number is [phone]. We also have an after hours call service for urgent matters and there is a physician on-call for urgent questions. For any emergencies you know to call 911 or go to the nearest emergency room  Donepezil tablets What is this medicine? DONEPEZIL (doe NEP e zil) is used to treat mild to moderate dementia caused by Alzheimer's disease. This medicine may be used for other purposes; ask your health care provider or pharmacist if you have questions. COMMON BRAND NAME(S): Aricept What should I tell my health care provider before I take this medicine? They need to know if you have any of these conditions: -asthma or other lung disease -difficulty passing urine -head injury -heart disease -history of irregular heartbeat -liver disease -seizures (convulsions) -stomach or intestinal disease, ulcers or stomach bleeding -an unusual or allergic reaction to donepezil, other medicines, foods, dyes, or preservatives -pregnant or trying to get pregnant -breast-feeding How should I use this medicine? Take this medicine by mouth with a glass of water. Follow the directions on the prescription label. You may take this medicine with or without food. Take this medicine at regular intervals. This medicine is usually taken before  bedtime. Do not take it more often than directed. Continue to take your medicine even if you feel better. Do not stop taking except on your doctor's advice. If you are taking the 23 mg donepezil tablet, swallow it whole; do not cut, crush, or chew it. Talk to your pediatrician regarding the use of this medicine in children. Special care may be needed. Overdosage: If you think you have taken too much of this medicine contact a poison control center or emergency room at once. NOTE: This medicine is only for you. Do not share this medicine with others. What if I miss a dose? If you miss a dose, take it as soon as you can. If it is almost time for your next dose, take only that dose, do not take double or extra doses. What may interact with this medicine? Do not take this medicine with any of the following medications: -certain medicines for fungal infections like itraconazole, fluconazole, posaconazole, and voriconazole -cisapride -dextromethorphan; quinidine -dofetilide -dronedarone -pimozide -quinidine -thioridazine -ziprasidone This medicine may also interact with the following medications: -antihistamines for allergy, cough and cold -atropine -bethanechol -carbamazepine -certain medicines for bladder problems like oxybutynin, tolterodine -certain medicines for Parkinson's disease like benztropine, trihexyphenidyl -certain medicines for stomach problems like dicyclomine, hyoscyamine -certain medicines for travel sickness like scopolamine -dexamethasone -ipratropium -NSAIDs, medicines for pain and inflammation, like ibuprofen or naproxen -other medicines for Alzheimer's disease -other medicines that prolong the QT interval (cause an abnormal heart rhythm) -phenobarbital -phenytoin -rifampin, rifabutin or rifapentine This list may not describe all possible interactions. Give your health care provider  a list of all the medicines, herbs, non-prescription drugs, or dietary supplements  you use. Also tell them if you smoke, drink alcohol, or use illegal drugs. Some items may interact with your medicine. What should I watch for while using this medicine? Visit your doctor or health care professional for regular checks on your progress. Check with your doctor or health care professional if your symptoms do not get better or if they get worse. You may get drowsy or dizzy. Do not drive, use machinery, or do anything that needs mental alertness until you know how this drug affects you. What side effects may I notice from receiving this medicine? Side effects that you should report to your doctor or health care professional as soon as possible: -allergic reactions like skin rash, itching or hives, swelling of the face, lips, or tongue -feeling faint or lightheaded, falls -loss of bladder control -seizures -signs and symptoms of a dangerous change in heartbeat or heart rhythm like chest pain; dizziness; fast or irregular heartbeat; palpitations; feeling faint or lightheaded, falls; breathing problems -signs and symptoms of infection like fever or chills; cough; sore throat; pain or trouble passing urine -signs and symptoms of liver injury like dark yellow or brown urine; general ill feeling or flu-like symptoms; light-colored stools; loss of appetite; nausea; right upper belly pain; unusually weak or tired; yellowing of the eyes or skin -slow heartbeat or palpitations -unusual bleeding or bruising -vomiting Side effects that usually do not require medical attention (report to your doctor or health care professional if they continue or are bothersome): -diarrhea, especially when starting treatment -headache -loss of appetite -muscle cramps -nausea -stomach upset This list may not describe all possible side effects. Call your doctor for medical advice about side effects. You may report side effects to FDA at 1-800-FDA-1088. Where should I keep my medicine? Keep out of reach of  children. Store at room temperature between 15 and 30 degrees C (59 and 86 degrees F). Throw away any unused medicine after the expiration date. NOTE: This sheet is a summary. It may not cover all possible information. If you have questions about this medicine, talk to your doctor, pharmacist, or health care provider.  2018 Elsevier/Gold Standard (2016-03-06 21:00:42)

## 2017-01-31 NOTE — Progress Notes (Signed)
[facility]    Provider:  Dr Lucia Gaskins Referring Provider: Thomas Hoff* Primary Care Physician:  Arrie Senate, FNP   Interval history 01/31/2017: Aricept really helped. They noticed a change a few weeks later. Her other medications were also reduced by psychiatry. She feels better. Accompanied by mother. She gets agitated and has anxiety attacks. She gets confused with reality and what is real. She lives with sister who is guardian. Family is looking for helpers for patient. She sleeps well. Walking is better. No recent falls.   Interval history 11/27/2016: Patient was seen in September for memory changes. Today she is here for gait abnormality. Past medical history depression and anxiety, Schizophrenia (managed per daughter), tobacco use, hallucinations, headache. She is here with her mom and sister who provides much information. The memory is getting worse. Her gait is worsening, they feel since Lat July but when I saw her her gait appeared normal, they say she has some good days and PT. Will start he on some Aricept. She can;t get dressed by herself in the morning. She can't put her shoes on. She can;t dress anymore without assistance. She was taking her medications wrong before but now sister has guardianship. She has a distant cousin with dementia in her 19s. She can;t dress herself, difficulty with ADLs, need help with all IADLs. Slowly progressed over time. Ongoing for 4 or 5 years and slowly progressive. Discussed given MRI findings and progressive memory loss concerning for early onset dementia,  Needs an FDG PET CT.   Discussed MRI brain results which did show mesial temporal atrophy:  Mildly abnormal MRI brain (with and without) demonstrating: 1. Mild scattered periventricular and subcortical foci of non-specific gliosis.  2. 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 3. No acute findings.  EEG was slowed.      VWU:[id number] L Joyneris a 46 y.o.femalehere as a referral from Dr. Elease Hashimoto altered mental status and memory changes. Past medical history depression and anxiety, Schizophrenia (managed per daughter), tobacco use, hallucinations, headache. She is here with her daughter who provides much information. She has been :zoning out" and bedwetting and at times unresponsive. Patient has schizophrenia and she hears voices and has paranoia but she has had this for many years and she is well managed. Recently this year or longer her memory is declining, she can';t tell anyone the day, what time it is or what season it is. She can forget anything. She forgets that she has kids sometimes, forgets things are in her purse. Memories lostare both old and new. Started in July of this year, she has had 3-4 UTIs since July and she has confusion with the UTIs. She has been treated and no UTIs now. She is not responsive all the time. She stares. They find urine in the bed. There is rinigng in the ears. Motor skills are decreased, sometimes can't feed herself or wipe herself or brush her teeth unless they prompt her to do it, she gets frustrated and it comes and goes. She has mildtremors sometimes and that is difficult but sometimes her hands just fall like she has no control over it. She will pick up her sand which and then her hands will just fall and she drops the food. It is not the tremors, more loss of control of motor function or confusion on the steps needed. Her walking was bad in August but it has improved. She has been falling. She is confused since July. There has  been stress in her life. She has been more paranoid this year but still her schizophrenia has been controlled for years and unclear why her memory would decline so substantially. She can;t cook, can't follow the steps. Symptoms progressive, nothing improves her memory, no other associated symptoms or modifying factors. Prior to this decline  patient was able to make meals, drive and now she can't. No drug or alcohol use. She denies SI/intent. Unknown FHx of dementia.  Reviewed notes, labs and imaging from outside physicians, which showed:  BUN 7, creatinine 1.15 04/26/2016. TSH 6 09/28/2015.  Personally reviewed CT head and agree with the following: There is no evidence for acute hemorrhage, hydrocephalus, mass lesion, or abnormal extra-axial fluid collection. No definite CT evidence for acute infarction. Prominence the lateral ventricles is stable in the interval. The visualized paranasal sinuses and mastoid air cells are clear.  ED 7/27: Patient admitted psychotic, walking around the room naked and not aware of the situation. Per her mother patient psychotic over the last several days. Patient's mother reportedthat on 7/14 when they were in [place] family the patient began to have changes in behavior. She previously reportedpatient became confused, had intermittent episodes of being nonverbal and appeared to be a per stimulated when she reported that she was having auditory hallucinations. Mother also reports that patient began having intermittent episodes of urinary incontinence, right sided weakness and difficulty ambulating. The motherreportedpatient was evaluated in the ED in [place], was diagnosed with UTI and reported having negative stroke workup including labs and CT head. She was admitted and treated. Subsequently she displayed "normal behavior, thought process.Marland Kitchenand displays clear thinking with a plan"  Review of Systems: Patient complains of symptoms per HPI as well as the following symptoms: Weight loss, shortness of breath, easy bruising, increased thirst, ringing in ears, trouble swallowing, aching muscles, urination problems, incontinence, memory loss, confusion, headache, weakness, slurred speech, sleepiness, depression, anxiety, too much sleep, decreased energy, change in appetite, disinterest in  activities, suicidal thoughts, hallucinations, racing thoughts. Pertinent negatives per HPI. All others negative.   Social History   Social History  . Marital status: Divorced    Spouse name: N/A  . Number of children: 5  . Years of education: 12   Occupational History  . Disabled    Social History Main Topics  . Smoking status: Former Smoker    Packs/day: 0.25    Years: 4.00    Types: Cigarettes    Quit date: 05/01/2016  . Smokeless tobacco: Never Used  . Alcohol use 0.0 oz/week     Comment: Quit 2017  . Drug use: No  . Sexual activity: Yes    Birth control/ protection: Condom   Other Topics Concern  . Not on file   Social History Narrative   Lives alone   Caffeine use: Soda- daily   Right-handed    Family History  Problem Relation Age of Onset  . Diabetes Father   . Hypertension Mother   . Hypertension Sister   . Lupus Sister   . Heart disease Maternal Grandfather   . Lupus      Past Medical History:  Diagnosis Date  . Anxiety and depression   . Bipolar disorder (HCC)   . Cystitis, interstitial   . Elevated troponin    a. 09/2015: CP/elevated troponin up to 0.11 - unclear significance. CT neg for PE, LHC neg for CAD, normal echo.  . Microcytic anemia    Noted on labs  . Panic attack   .  Paranoid schizophrenia (HCC)   . Tobacco abuse     Past Surgical History:  Procedure Laterality Date  . CARDIAC CATHETERIZATION N/A 09/29/2015   Procedure: Left Heart Cath and Coronary Angiography;  Surgeon: Corky Crafts, MD;  Location: [facility] INVASIVE CV LAB;  Service: Cardiovascular;  Laterality: N/A;  . TUBAL LIGATION      Current Outpatient Prescriptions  Medication Sig Dispense Refill  . ALPRAZolam (XANAX) 0.25 MG tablet Take 0.25 mg by mouth 2 (two) times daily.    . benztropine (COGENTIN) 1 MG tablet Take 0.5 mg by mouth at bedtime.     Marland Kitchen FLUoxetine (PROZAC) 40 MG capsule Take 20 mg by mouth daily.     Marland Kitchen ibuprofen (ADVIL,MOTRIN) 600 MG tablet Take 1  tablet (600 mg total) by mouth every 8 (eight) hours as needed. 30 tablet 0  . risperiDONE (RISPERDAL) 2 MG tablet Take 2 mg by mouth daily.     Marland Kitchen trimethoprim (TRIMPEX) 100 MG tablet Take 100 mg by mouth daily.    Marland Kitchen zolpidem (AMBIEN) 10 MG tablet Take 10 mg by mouth daily.    . divalproex (DEPAKOTE ER) 500 MG 24 hr tablet Take 1,500 mg by mouth at bedtime.     . donepezil (ARICEPT) 10 MG tablet Take 1 tablet (10 mg total) by mouth 2 (two) times daily. 60 tablet 11  . lurasidone (LATUDA) 40 MG TABS tablet Take 40 mg by mouth daily with supper.     . traZODone (DESYREL) 100 MG tablet Take 1 tablet (100 mg total) by mouth at bedtime as needed for sleep. (Patient not taking: Reported on 01/30/2017) 30 tablet 0   No current facility-administered medications for this visit.     Allergies as of 01/31/2017 - Review Complete 01/31/2017  Allergen Reaction Noted  . Penicillins Itching and Rash 06/09/2012    Vitals: BP (!) 131/93   Pulse 76   Ht  (1.626 m)   Wt 178 lb 9.6 oz (81 kg)   BMI 30.66 kg/m  Last Weight:  Wt Readings from Last 1 Encounters:  01/31/17 178 lb 9.6 oz (81 kg)   Last Height:   Ht Readings from Last 1 Encounters:  01/31/17  (1.626 m)     MMSE - Mini Mental State Exam 01/31/2017 11/27/2016 06/28/2016  Orientation to time 5 0 2  Orientation to Place Registration Attention/ Calculation 2 0 0  Recall 2 0 1  Language- name 2 objects Language- repeat Language- follow 3 step command Language- read & follow direction Write a sentence 1 0 1  Copy design Total score The patient is oriented to person, place, year and month recent and remote memory impaired;  language fluent;  Impaired attention, concentration,  fund of knowledge impaired Cranial Nerves: The pupils are equal, round, and reactive to light. Attempted fundoscopic exam could not visualize due to small pupils. Visual  fields are full to finger confrontation. Extraocular movements are intact. Trigeminal sensation is intact and the muscles of mastication are normal. The face is symmetric. The palate elevates in the midline. Hearing intact. Voice is normal. Shoulder shrug is normal. The tongue has normal motion without fasciculations.   Coordination: Normal finger to nose and heel to shin but difficulty understanding instructions  Gait: Imbalance and shuffling  Motor  Observation: mild postural tremor Tone: Normal muscle tone.   Posture: Posture is normal. normal erect  Strength: Strength is V/V in the upper and lower limbs.   Sensation: intact to LT  Reflex Exam:  DTR's: Deep tendon reflexes in the upper and lower extremities are normal bilaterally.  Toes: The toes are downgoing bilaterally.  Clonus: Clonus is absent.      Assessment/Plan:46 year old female with schizophrenia  progressive (over 4-5 years) impairment of memory and progressive decline. MMSE 11 today, 18 (06/2016), 15(Aug 2017), 26 (03/2015), 15(05/2016), 18(06/2016). Now living with sister who is guardian. Excessive lab testing was unremarkable. MRI brain showed moderate-severe mesial temporal atrophy worrisome for Alzheimer's type dementia.   Patient worsening, moved in with sister who is now guardian. She is taking her medications now that sister administers and still with memory decline. Given clinical progression and MRI findings of moderate-severe mesial temporal atrophy worrisome for early-onset  Alzheimer's type dementia especially given a (remote) family history of early-onset dementia. She is one of triplets and one of her sisters may be showing memory problems, unclear.   - Will send for formal neurocognitive testing, we appreciate Dr. Alinda Dooms as we know this evaluation will be difficult as to the etiology given her ongoing psychiatric illness but concomitant Alzheimers is  possible given FHx of early onset dementia and her severe mesial temporal atrophy on MRI.    - FDG PET Scan: Negative for Alzheimers or Frontotemporal lobe pathology - Formal neurocognitive testing: completed pending report - Advanced homecare was ordered at last appt: Nurse for medication management, Occupation therapy, Physical therapy at home for gait abnormality and falls but also for home safety inspection, social worker for disease management education - May need academic memory clinic to further pursue implication of early-onset dementia and genetic counseling of family. - Increase Aricept to  bid because she has seen such a robust improvement, can increase up to  daily. MMSE improved 25/30.   Naomie Dean, MD  [facility] [address] [place], [place] [address]  Phone [phone] Fax [phone]  A total of 25 minutes was spent face-to-face with this patient. Over half this time was spent on counseling patient on the major neurocognitive disorder diagnosis and different diagnostic and therapeutic options available.

## 2017-02-01 ENCOUNTER — Encounter: Payer: Self-pay | Admitting: Family Medicine

## 2017-02-01 ENCOUNTER — Ambulatory Visit: Payer: Medicare Other | Attending: Family Medicine | Admitting: Family Medicine

## 2017-02-01 VITALS — BP 128/91 | HR 75 | Temp 98.3°F | Resp 18 | Ht 64.0 in | Wt 179.0 lb

## 2017-02-01 DIAGNOSIS — K30 Functional dyspepsia: Secondary | ICD-10-CM | POA: Diagnosis not present

## 2017-02-01 DIAGNOSIS — Z23 Encounter for immunization: Secondary | ICD-10-CM | POA: Diagnosis not present

## 2017-02-01 DIAGNOSIS — Z0000 Encounter for general adult medical examination without abnormal findings: Secondary | ICD-10-CM

## 2017-02-01 DIAGNOSIS — I10 Essential (primary) hypertension: Secondary | ICD-10-CM | POA: Diagnosis not present

## 2017-02-01 DIAGNOSIS — Z9889 Other specified postprocedural states: Secondary | ICD-10-CM | POA: Insufficient documentation

## 2017-02-01 LAB — POCT UA - MICROALBUMIN
Creatinine, POC: 50 mg/dL
Microalbumin Ur, POC: 10 mg/L

## 2017-02-01 MED ORDER — RANITIDINE HCL 75 MG PO TABS
75.0000 mg | ORAL_TABLET | Freq: Two times a day (BID) | ORAL | Status: DC | PRN
Start: 2017-02-01 — End: 2018-01-08

## 2017-02-01 MED ORDER — HYDROCHLOROTHIAZIDE 12.5 MG PO TABS: 12.5000 mg | ORAL_TABLET | Freq: Every day | ORAL | 2 refills | Status: DC

## 2017-02-01 NOTE — Progress Notes (Signed)
Patient is here for HTN  Patient denies pain for today

## 2017-02-01 NOTE — Patient Instructions (Signed)
You will be called with your labs results.  Hydrochlorothiazide, HCTZ capsules or tablets What is this medicine? HYDROCHLOROTHIAZIDE (hye droe klor oh THYE a zide) is a diuretic. It increases the amount of urine passed, which causes the body to lose salt and water. This medicine is used to treat high blood pressure. It is also reduces the swelling and water retention caused by various medical conditions, such as heart, liver, or kidney disease. This medicine may be used for other purposes; ask your health care provider or pharmacist if you have questions. COMMON BRAND NAME(S): Esidrix, Ezide, HydroDIURIL, Microzide, Oretic, Zide What should I tell my health care provider before I take this medicine? They need to know if you have any of these conditions: -diabetes -gout -immune system problems, like lupus -kidney disease or kidney stones -liver disease -pancreatitis -small amount of urine or difficulty passing urine -an unusual or allergic reaction to hydrochlorothiazide, sulfa drugs, other medicines, foods, dyes, or preservatives -pregnant or trying to get pregnant -breast-feeding How should I use this medicine? Take this medicine by mouth with a glass of water. Follow the directions on the prescription label. Take your medicine at regular intervals. Remember that you will need to pass urine frequently after taking this medicine. Do not take your doses at a time of day that will cause you problems. Do not stop taking your medicine unless your doctor tells you to. Talk to your pediatrician regarding the use of this medicine in children. Special care may be needed. Overdosage: If you think you have taken too much of this medicine contact a poison control center or emergency room at once. NOTE: This medicine is only for you. Do not share this medicine with others. What if I miss a dose? If you miss a dose, take it as soon as you can. If it is almost time for your next dose, take only that dose.  Do not take double or extra doses. What may interact with this medicine? -cholestyramine -colestipol -digoxin -dofetilide -lithium -medicines for blood pressure -medicines for diabetes -medicines that relax muscles for surgery -other diuretics -steroid medicines like prednisone or cortisone This list may not describe all possible interactions. Give your health care provider a list of all the medicines, herbs, non-prescription drugs, or dietary supplements you use. Also tell them if you smoke, drink alcohol, or use illegal drugs. Some items may interact with your medicine. What should I watch for while using this medicine? Visit your doctor or health care professional for regular checks on your progress. Check your blood pressure as directed. Ask your doctor or health care professional what your blood pressure should be and when you should contact him or her. You may need to be on a special diet while taking this medicine. Ask your doctor. Check with your doctor or health care professional if you get an attack of severe diarrhea, nausea and vomiting, or if you sweat a lot. The loss of too much body fluid can make it dangerous for you to take this medicine. You may get drowsy or dizzy. Do not drive, use machinery, or do anything that needs mental alertness until you know how this medicine affects you. Do not stand or sit up quickly, especially if you are an older patient. This reduces the risk of dizzy or fainting spells. Alcohol may interfere with the effect of this medicine. Avoid alcoholic drinks. This medicine may affect your blood sugar level. If you have diabetes, check with your doctor or health care professional before  changing the dose of your diabetic medicine. This medicine can make you more sensitive to the sun. Keep out of the sun. If you cannot avoid being in the sun, wear protective clothing and use sunscreen. Do not use sun lamps or tanning beds/booths. What side effects may I  notice from receiving this medicine? Side effects that you should report to your doctor or health care professional as soon as possible: -allergic reactions such as skin rash or itching, hives, swelling of the lips, mouth, tongue, or throat -changes in vision -chest pain -eye pain -fast or irregular heartbeat -feeling faint or lightheaded, falls -gout attack -muscle pain or cramps -pain or difficulty when passing urine -pain, tingling, numbness in the hands or feet -redness, blistering, peeling or loosening of the skin, including inside the mouth -unusually weak or tired Side effects that usually do not require medical attention (report to your doctor or health care professional if they continue or are bothersome): -change in sex drive or performance -dry mouth -headache -stomach upset This list may not describe all possible side effects. Call your doctor for medical advice about side effects. You may report side effects to FDA at 1-800-FDA-1088. Where should I keep my medicine? Keep out of the reach of children. Store at room temperature between 15 and 30 degrees C (59 and 86 degrees F). Do not freeze. Protect from light and moisture. Keep container closed tightly. Throw away any unused medicine after the expiration date. NOTE: This sheet is a summary. It may not cover all possible information. If you have questions about this medicine, talk to your doctor, pharmacist, or health care provider.  2018 Elsevier/Gold Standard (2010-05-13 12:57:37)  Hypertension Hypertension is another name for high blood pressure. High blood pressure forces your heart to work harder to pump blood. This can cause problems over time. There are two numbers in a blood pressure reading. There is a top number (systolic) over a bottom number (diastolic). It is best to have a blood pressure below 120/80. Healthy choices can help lower your blood pressure. You may need medicine to help lower your blood pressure  if:  Your blood pressure cannot be lowered with healthy choices.  Your blood pressure is higher than 130/80. Follow these instructions at home: Eating and drinking   If directed, follow the DASH eating plan. This diet includes:  Filling half of your plate at each meal with fruits and vegetables.  Filling one quarter of your plate at each meal with whole grains. Whole grains include whole wheat pasta, brown rice, and whole grain bread.  Eating or drinking low-fat dairy products, such as skim milk or low-fat yogurt.  Filling one quarter of your plate at each meal with low-fat (lean) proteins. Low-fat proteins include fish, skinless chicken, eggs, beans, and tofu.  Avoiding fatty meat, cured and processed meat, or chicken with skin.  Avoiding premade or processed food.  Eat less than 1,500 mg of salt (sodium) a day.  Limit alcohol use to no more than 1 drink a day for nonpregnant women and 2 drinks a day for men. One drink equals 12 oz of beer, 5 oz of wine, or 1 oz of hard liquor. Lifestyle   Work with your doctor to stay at a healthy weight or to lose weight. Ask your doctor what the best weight is for you.  Get at least 30 minutes of exercise that causes your heart to beat faster (aerobic exercise) most days of the week. This may include walking, swimming,  or biking.  Get at least 30 minutes of exercise that strengthens your muscles (resistance exercise) at least 3 days a week. This may include lifting weights or pilates.  Do not use any products that contain nicotine or tobacco. This includes cigarettes and e-cigarettes. If you need help quitting, ask your doctor.  Check your blood pressure at home as told by your doctor.  Keep all follow-up visits as told by your doctor. This is important. Medicines   Take over-the-counter and prescription medicines only as told by your doctor. Follow directions carefully.  Do not skip doses of blood pressure medicine. The medicine does  not work as well if you skip doses. Skipping doses also puts you at risk for problems.  Ask your doctor about side effects or reactions to medicines that you should watch for. Contact a doctor if:  You think you are having a reaction to the medicine you are taking.  You have headaches that keep coming back (recurring).  You feel dizzy.  You have swelling in your ankles.  You have trouble with your vision. Get help right away if:  You get a very bad headache.  You start to feel confused.  You feel weak or numb.  You feel faint.  You get very bad pain in your:  Chest.  Belly (abdomen).  You throw up (vomit) more than once.  You have trouble breathing. Summary  Hypertension is another name for high blood pressure.  Making healthy choices can help lower blood pressure. If your blood pressure cannot be controlled with healthy choices, you may need to take medicine. This information is not intended to replace advice given to you by your health care provider. Make sure you discuss any questions you have with your health care provider. Document Released: 03/06/2008 Document Revised: 08/16/2016 Document Reviewed: 08/16/2016 Elsevier Interactive Patient Education  2017 [employer].

## 2017-02-01 NOTE — Progress Notes (Signed)
Subjective:  Patient ID: Jody [name], female    DOB: 01-Mar-1971  Age: 46 y.o. MRN: [id number]  CC: Hypertension   HPI Jody [name] presents for   Hypertension: Patient here for follow-up of elevated blood pressure. She ambulates regularly for  exercise and is not adherent to low salt diet.  Blood pressure is not well controlled at home. She receives in home health services. It was noted at previous visits that patient's blood pressures were elevated 130's-140's SBP. Cardiac symptoms none. Patient denies chest pain, dyspnea, lower extremity edema and palpitations.  Cardiovascular risk factors: hypertension. Use of agents associated with hypertension: NSAIDS. History of target organ damage: none.   Outpatient Medications Prior to Visit  Medication Sig Dispense Refill  . ALPRAZolam (XANAX) 0.25 MG tablet Take 0.25 mg by mouth 2 (two) times daily.    . benztropine (COGENTIN) 1 MG tablet Take 0.5 mg by mouth at bedtime.     . donepezil (ARICEPT) 10 MG tablet Take 1 tablet (10 mg total) by mouth 2 (two) times daily. 60 tablet 11  . FLUoxetine (PROZAC) 40 MG capsule Take 20 mg by mouth daily.     Marland Kitchen ibuprofen (ADVIL,MOTRIN) 600 MG tablet Take 1 tablet (600 mg total) by mouth every 8 (eight) hours as needed. 30 tablet 0  . risperiDONE (RISPERDAL) 2 MG tablet Take 2 mg by mouth daily.     Marland Kitchen trimethoprim (TRIMPEX) 100 MG tablet Take 100 mg by mouth daily.    Marland Kitchen zolpidem (AMBIEN) 10 MG tablet Take 10 mg by mouth daily.    . divalproex (DEPAKOTE ER) 500 MG 24 hr tablet Take 1,500 mg by mouth at bedtime.     Marland Kitchen lurasidone (LATUDA) 40 MG TABS tablet Take 40 mg by mouth daily with supper.     . traZODone (DESYREL) 100 MG tablet Take 1 tablet (100 mg total) by mouth at bedtime as needed for sleep. (Patient not taking: Reported on 01/30/2017) 30 tablet 0   No facility-administered medications prior to visit.     ROS Review of Systems  Constitutional: Negative.   Eyes: Negative.   Respiratory:  Negative.   Cardiovascular: Negative.        Elevated BP's at home.  Gastrointestinal: Negative.   Skin: Negative.     Objective:  BP (!) 128/91 (BP Location: Left Arm, Patient Position: Sitting, Cuff Size: Normal)   Pulse 75   Temp 98.3 F (36.8 C) (Oral)   Resp 18   Ht '5\' 4"'  (1.626 m)   Wt 179 lb (81.2 kg)   SpO2 98%   BMI 30.73 kg/m   BP/Weight 02/01/2017 01/31/2017 6/43/3295  Systolic BP 188 416 606  Diastolic BP 91 93 82  Wt. (Lbs) 179 178.6 182.6  BMI 30.73 30.66 31.34  Some encounter information is confidential and restricted. Go to Review Flowsheets activity to see all data.     Physical Exam  Constitutional: She appears well-developed and well-nourished.  Eyes: Conjunctivae are normal. Pupils are equal, round, and reactive to light.  Neck: No JVD present.  Cardiovascular: Normal rate, regular rhythm, normal heart sounds and intact distal pulses.   Pulmonary/Chest: Effort normal and breath sounds normal.  Abdominal: Soft. Bowel sounds are normal.  Skin: Skin is warm and dry.  Psychiatric: Her affect is blunt. She expresses no homicidal and no suicidal ideation. She expresses no suicidal plans and no homicidal plans.  Nursing note and vitals reviewed.   Assessment & Plan:   Problem List Items  Addressed This Visit    None    Visit Diagnoses    Hypertension, unspecified type    -  Primary   Relevant Medications   hydrochlorothiazide (HYDRODIURIL) 12.5 MG tablet   Other Relevant Orders   CMP14+EGFR   Lipid Panel   POCT UA - Microalbumin (Completed)   Acid indigestion       Relevant Medications   ranitidine (ZANTAC 75) 75 MG tablet   Healthcare maintenance       Relevant Orders   Tdap vaccine greater than or equal to 7yo IM (Completed)      Meds ordered this encounter  Medications  . hydrochlorothiazide (HYDRODIURIL) 12.5 MG tablet    Sig: Take 1 tablet (12.5 mg total) by mouth daily.    Dispense:  30 tablet    Refill:  2    Order Specific Question:    Supervising Provider    Answer:   Tresa Garter [id number]  . ranitidine (ZANTAC 75) 75 MG tablet    Sig: Take 1 tablet (75 mg total) by mouth 2 (two) times daily as needed for heartburn.    Order Specific Question:   Supervising Provider    Answer:   Tresa Garter [[id number]]    Follow-up: Return in about 1 month (around 03/04/2017) for PAP / HTN.   Alfonse Spruce FNP

## 2017-02-02 LAB — LIPID PANEL
CHOLESTEROL TOTAL: 216 mg/dL — AB (ref 100–199)
Chol/HDL Ratio: 5.1 ratio — ABNORMAL HIGH (ref 0.0–4.4)
HDL: 42 mg/dL (ref >39)
LDL Calculated: 149 mg/dL — ABNORMAL HIGH (ref 0–99)
Triglycerides: 124 mg/dL (ref 0–149)
VLDL Cholesterol Cal: 25 mg/dL (ref 5–40)

## 2017-02-02 LAB — CMP14+EGFR
ALBUMIN: 4.5 g/dL (ref 3.5–5.5)
ALK PHOS: 65 IU/L (ref 39–117)
ALT: 28 IU/L (ref 0–32)
AST: 24 IU/L (ref 0–40)
Albumin/Globulin Ratio: 1.8 (ref 1.2–2.2)
BILIRUBIN TOTAL: 0.3 mg/dL (ref 0.0–1.2)
BUN / CREAT RATIO: 7 — AB (ref 9–23)
BUN: 7 mg/dL (ref 6–24)
CHLORIDE: 104 mmol/L (ref 96–106)
CO2: 15 mmol/L — ABNORMAL LOW (ref 18–29)
Calcium: 9.6 mg/dL (ref 8.7–10.2)
Creatinine, Ser: 1.06 mg/dL — ABNORMAL HIGH (ref 0.57–1.00)
GFR calc Af Amer: 74 mL/min/{1.73_m2} (ref >59)
GFR calc non Af Amer: 64 mL/min/{1.73_m2} (ref >59)
GLOBULIN, TOTAL: 2.5 g/dL (ref 1.5–4.5)
GLUCOSE: 104 mg/dL — AB (ref 65–99)
Potassium: 4.4 mmol/L (ref 3.5–5.2)
SODIUM: 141 mmol/L (ref 134–144)
Total Protein: 7 g/dL (ref 6.0–8.5)

## 2017-02-05 DIAGNOSIS — F329 Major depressive disorder, single episode, unspecified: Secondary | ICD-10-CM | POA: Diagnosis not present

## 2017-02-05 DIAGNOSIS — R2689 Other abnormalities of gait and mobility: Secondary | ICD-10-CM | POA: Diagnosis not present

## 2017-02-05 DIAGNOSIS — F0281 Dementia in other diseases classified elsewhere with behavioral disturbance: Secondary | ICD-10-CM | POA: Diagnosis not present

## 2017-02-05 DIAGNOSIS — G300 Alzheimer's disease with early onset: Secondary | ICD-10-CM | POA: Diagnosis not present

## 2017-02-05 DIAGNOSIS — F200 Paranoid schizophrenia: Secondary | ICD-10-CM | POA: Diagnosis not present

## 2017-02-05 DIAGNOSIS — Z9181 History of falling: Secondary | ICD-10-CM | POA: Diagnosis not present

## 2017-02-07 ENCOUNTER — Telehealth: Payer: Self-pay

## 2017-02-07 DIAGNOSIS — G300 Alzheimer's disease with early onset: Secondary | ICD-10-CM | POA: Diagnosis not present

## 2017-02-07 DIAGNOSIS — R2689 Other abnormalities of gait and mobility: Secondary | ICD-10-CM | POA: Diagnosis not present

## 2017-02-07 DIAGNOSIS — F200 Paranoid schizophrenia: Secondary | ICD-10-CM | POA: Diagnosis not present

## 2017-02-07 DIAGNOSIS — Z9181 History of falling: Secondary | ICD-10-CM | POA: Diagnosis not present

## 2017-02-07 DIAGNOSIS — F0281 Dementia in other diseases classified elsewhere with behavioral disturbance: Secondary | ICD-10-CM | POA: Diagnosis not present

## 2017-02-07 DIAGNOSIS — F329 Major depressive disorder, single episode, unspecified: Secondary | ICD-10-CM | POA: Diagnosis not present

## 2017-02-07 NOTE — Telephone Encounter (Signed)
CMA call regarding lab results   Patient did not answer but left a detailed message  & if have any questions just to call back   

## 2017-02-07 NOTE — Telephone Encounter (Signed)
-----   Message from Lizbeth BarkMandesia R Hairston, FNP sent at 02/07/2017  1:02 PM EDT ----- Labs shows mildly decreased kidney function. This can occur with uncontrolled hypertension overtime. Continue to take your medications for BP and do not smoke. Recommend recheck in 6 months. Lipid levels were elevated. This can increase your risk of heart disease overtime. Start eating a diet low in saturated fat. Limit your intake of fried foods, red meats, and whole milk. Increase physical activity as directed by your physical therapist.

## 2017-02-08 ENCOUNTER — Encounter: Payer: Self-pay | Admitting: Psychology

## 2017-02-08 ENCOUNTER — Telehealth: Payer: Self-pay | Admitting: Neurology

## 2017-02-08 DIAGNOSIS — N3946 Mixed incontinence: Secondary | ICD-10-CM | POA: Diagnosis not present

## 2017-02-08 DIAGNOSIS — N3944 Nocturnal enuresis: Secondary | ICD-10-CM | POA: Diagnosis not present

## 2017-02-08 NOTE — Telephone Encounter (Signed)
[facility] [phone] called wanting speech eval for thought process and cognition.
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
## 2017-02-12 ENCOUNTER — Ambulatory Visit (INDEPENDENT_AMBULATORY_CARE_PROVIDER_SITE_OTHER): Payer: Medicare Other | Admitting: Psychology

## 2017-02-12 ENCOUNTER — Encounter: Payer: Self-pay | Admitting: Psychology

## 2017-02-12 DIAGNOSIS — F200 Paranoid schizophrenia: Secondary | ICD-10-CM

## 2017-02-12 DIAGNOSIS — Z9181 History of falling: Secondary | ICD-10-CM | POA: Diagnosis not present

## 2017-02-12 DIAGNOSIS — F0281 Dementia in other diseases classified elsewhere with behavioral disturbance: Secondary | ICD-10-CM | POA: Diagnosis not present

## 2017-02-12 DIAGNOSIS — G300 Alzheimer's disease with early onset: Secondary | ICD-10-CM | POA: Diagnosis not present

## 2017-02-12 DIAGNOSIS — F329 Major depressive disorder, single episode, unspecified: Secondary | ICD-10-CM | POA: Diagnosis not present

## 2017-02-12 DIAGNOSIS — R413 Other amnesia: Secondary | ICD-10-CM | POA: Diagnosis not present

## 2017-02-12 DIAGNOSIS — R2689 Other abnormalities of gait and mobility: Secondary | ICD-10-CM | POA: Diagnosis not present

## 2017-02-12 NOTE — Telephone Encounter (Signed)
That's fine, thanks!

## 2017-02-12 NOTE — Telephone Encounter (Signed)
Returned call to [facility] and gave verbal order for speech eval. Verbalized understanding and appreciation for call.

## 2017-02-14 DIAGNOSIS — F329 Major depressive disorder, single episode, unspecified: Secondary | ICD-10-CM | POA: Diagnosis not present

## 2017-02-14 DIAGNOSIS — F200 Paranoid schizophrenia: Secondary | ICD-10-CM | POA: Diagnosis not present

## 2017-02-14 DIAGNOSIS — F0281 Dementia in other diseases classified elsewhere with behavioral disturbance: Secondary | ICD-10-CM | POA: Diagnosis not present

## 2017-02-14 DIAGNOSIS — Z9181 History of falling: Secondary | ICD-10-CM | POA: Diagnosis not present

## 2017-02-14 DIAGNOSIS — R2689 Other abnormalities of gait and mobility: Secondary | ICD-10-CM | POA: Diagnosis not present

## 2017-02-14 DIAGNOSIS — G300 Alzheimer's disease with early onset: Secondary | ICD-10-CM | POA: Diagnosis not present

## 2017-02-16 DIAGNOSIS — F200 Paranoid schizophrenia: Secondary | ICD-10-CM | POA: Diagnosis not present

## 2017-02-16 DIAGNOSIS — F329 Major depressive disorder, single episode, unspecified: Secondary | ICD-10-CM | POA: Diagnosis not present

## 2017-02-16 DIAGNOSIS — F0281 Dementia in other diseases classified elsewhere with behavioral disturbance: Secondary | ICD-10-CM | POA: Diagnosis not present

## 2017-02-16 DIAGNOSIS — Z9181 History of falling: Secondary | ICD-10-CM | POA: Diagnosis not present

## 2017-02-16 DIAGNOSIS — G300 Alzheimer's disease with early onset: Secondary | ICD-10-CM | POA: Diagnosis not present

## 2017-02-16 DIAGNOSIS — R2689 Other abnormalities of gait and mobility: Secondary | ICD-10-CM | POA: Diagnosis not present

## 2017-02-19 DIAGNOSIS — R2689 Other abnormalities of gait and mobility: Secondary | ICD-10-CM | POA: Diagnosis not present

## 2017-02-19 DIAGNOSIS — Z9181 History of falling: Secondary | ICD-10-CM | POA: Diagnosis not present

## 2017-02-19 DIAGNOSIS — F329 Major depressive disorder, single episode, unspecified: Secondary | ICD-10-CM | POA: Diagnosis not present

## 2017-02-19 DIAGNOSIS — F0281 Dementia in other diseases classified elsewhere with behavioral disturbance: Secondary | ICD-10-CM | POA: Diagnosis not present

## 2017-02-19 DIAGNOSIS — F200 Paranoid schizophrenia: Secondary | ICD-10-CM | POA: Diagnosis not present

## 2017-02-19 DIAGNOSIS — G300 Alzheimer's disease with early onset: Secondary | ICD-10-CM | POA: Diagnosis not present

## 2017-02-19 NOTE — Telephone Encounter (Signed)
Returned call and left VO for speech therapy visits as requested. May call back if any additional questions/concerns.

## 2017-02-19 NOTE — Telephone Encounter (Signed)
[facility] [phone] Speech therapist called to request verbal orders for twice a week for 3 weeks , please call

## 2017-02-20 DIAGNOSIS — Z9181 History of falling: Secondary | ICD-10-CM | POA: Diagnosis not present

## 2017-02-20 DIAGNOSIS — F329 Major depressive disorder, single episode, unspecified: Secondary | ICD-10-CM | POA: Diagnosis not present

## 2017-02-20 DIAGNOSIS — R2689 Other abnormalities of gait and mobility: Secondary | ICD-10-CM | POA: Diagnosis not present

## 2017-02-20 DIAGNOSIS — F200 Paranoid schizophrenia: Secondary | ICD-10-CM | POA: Diagnosis not present

## 2017-02-20 DIAGNOSIS — F0281 Dementia in other diseases classified elsewhere with behavioral disturbance: Secondary | ICD-10-CM | POA: Diagnosis not present

## 2017-02-20 DIAGNOSIS — G300 Alzheimer's disease with early onset: Secondary | ICD-10-CM | POA: Diagnosis not present

## 2017-02-21 DIAGNOSIS — H4423 Degenerative myopia, bilateral: Secondary | ICD-10-CM | POA: Diagnosis not present

## 2017-02-21 DIAGNOSIS — H53002 Unspecified amblyopia, left eye: Secondary | ICD-10-CM | POA: Diagnosis not present

## 2017-02-22 DIAGNOSIS — F200 Paranoid schizophrenia: Secondary | ICD-10-CM | POA: Diagnosis not present

## 2017-02-22 DIAGNOSIS — F0281 Dementia in other diseases classified elsewhere with behavioral disturbance: Secondary | ICD-10-CM | POA: Diagnosis not present

## 2017-02-22 DIAGNOSIS — Z9181 History of falling: Secondary | ICD-10-CM | POA: Diagnosis not present

## 2017-02-22 DIAGNOSIS — R2689 Other abnormalities of gait and mobility: Secondary | ICD-10-CM | POA: Diagnosis not present

## 2017-02-22 DIAGNOSIS — F329 Major depressive disorder, single episode, unspecified: Secondary | ICD-10-CM | POA: Diagnosis not present

## 2017-02-22 DIAGNOSIS — G300 Alzheimer's disease with early onset: Secondary | ICD-10-CM | POA: Diagnosis not present

## 2017-02-28 DIAGNOSIS — F200 Paranoid schizophrenia: Secondary | ICD-10-CM | POA: Diagnosis not present

## 2017-02-28 DIAGNOSIS — Z9181 History of falling: Secondary | ICD-10-CM | POA: Diagnosis not present

## 2017-02-28 DIAGNOSIS — R2689 Other abnormalities of gait and mobility: Secondary | ICD-10-CM | POA: Diagnosis not present

## 2017-02-28 DIAGNOSIS — F0281 Dementia in other diseases classified elsewhere with behavioral disturbance: Secondary | ICD-10-CM | POA: Diagnosis not present

## 2017-02-28 DIAGNOSIS — F329 Major depressive disorder, single episode, unspecified: Secondary | ICD-10-CM | POA: Diagnosis not present

## 2017-02-28 DIAGNOSIS — G300 Alzheimer's disease with early onset: Secondary | ICD-10-CM | POA: Diagnosis not present

## 2017-03-02 ENCOUNTER — Ambulatory Visit: Payer: Self-pay | Admitting: Family Medicine

## 2017-03-02 DIAGNOSIS — F329 Major depressive disorder, single episode, unspecified: Secondary | ICD-10-CM | POA: Diagnosis not present

## 2017-03-02 DIAGNOSIS — F0281 Dementia in other diseases classified elsewhere with behavioral disturbance: Secondary | ICD-10-CM | POA: Diagnosis not present

## 2017-03-02 DIAGNOSIS — R2689 Other abnormalities of gait and mobility: Secondary | ICD-10-CM | POA: Diagnosis not present

## 2017-03-02 DIAGNOSIS — Z9181 History of falling: Secondary | ICD-10-CM | POA: Diagnosis not present

## 2017-03-02 DIAGNOSIS — F200 Paranoid schizophrenia: Secondary | ICD-10-CM | POA: Diagnosis not present

## 2017-03-02 DIAGNOSIS — G300 Alzheimer's disease with early onset: Secondary | ICD-10-CM | POA: Diagnosis not present

## 2017-03-07 DIAGNOSIS — Z9181 History of falling: Secondary | ICD-10-CM | POA: Diagnosis not present

## 2017-03-07 DIAGNOSIS — F329 Major depressive disorder, single episode, unspecified: Secondary | ICD-10-CM | POA: Diagnosis not present

## 2017-03-07 DIAGNOSIS — R2689 Other abnormalities of gait and mobility: Secondary | ICD-10-CM | POA: Diagnosis not present

## 2017-03-07 DIAGNOSIS — F0281 Dementia in other diseases classified elsewhere with behavioral disturbance: Secondary | ICD-10-CM | POA: Diagnosis not present

## 2017-03-07 DIAGNOSIS — F200 Paranoid schizophrenia: Secondary | ICD-10-CM | POA: Diagnosis not present

## 2017-03-07 DIAGNOSIS — G300 Alzheimer's disease with early onset: Secondary | ICD-10-CM | POA: Diagnosis not present

## 2017-03-14 ENCOUNTER — Telehealth: Payer: Self-pay | Admitting: Neurology

## 2017-03-14 DIAGNOSIS — Z9181 History of falling: Secondary | ICD-10-CM | POA: Diagnosis not present

## 2017-03-14 DIAGNOSIS — F200 Paranoid schizophrenia: Secondary | ICD-10-CM | POA: Diagnosis not present

## 2017-03-14 DIAGNOSIS — R2689 Other abnormalities of gait and mobility: Secondary | ICD-10-CM | POA: Diagnosis not present

## 2017-03-14 DIAGNOSIS — F329 Major depressive disorder, single episode, unspecified: Secondary | ICD-10-CM | POA: Diagnosis not present

## 2017-03-14 DIAGNOSIS — G300 Alzheimer's disease with early onset: Secondary | ICD-10-CM | POA: Diagnosis not present

## 2017-03-14 DIAGNOSIS — F0281 Dementia in other diseases classified elsewhere with behavioral disturbance: Secondary | ICD-10-CM | POA: Diagnosis not present

## 2017-03-14 NOTE — Telephone Encounter (Signed)
French Anaracy with [facility] is calling to get nurse visit moved to next week instead of 2 weeks.

## 2017-03-14 NOTE — Telephone Encounter (Signed)
[facility]/ [facility] and gave approval to change date of nurse visit. She verbalized understanding and appreciation for call.

## 2017-03-19 DIAGNOSIS — R2689 Other abnormalities of gait and mobility: Secondary | ICD-10-CM | POA: Diagnosis not present

## 2017-03-19 DIAGNOSIS — G300 Alzheimer's disease with early onset: Secondary | ICD-10-CM | POA: Diagnosis not present

## 2017-03-19 DIAGNOSIS — F0281 Dementia in other diseases classified elsewhere with behavioral disturbance: Secondary | ICD-10-CM | POA: Diagnosis not present

## 2017-03-19 DIAGNOSIS — F200 Paranoid schizophrenia: Secondary | ICD-10-CM | POA: Diagnosis not present

## 2017-03-19 DIAGNOSIS — Z9181 History of falling: Secondary | ICD-10-CM | POA: Diagnosis not present

## 2017-03-19 DIAGNOSIS — F329 Major depressive disorder, single episode, unspecified: Secondary | ICD-10-CM | POA: Diagnosis not present

## 2017-03-21 ENCOUNTER — Other Ambulatory Visit (HOSPITAL_COMMUNITY)
Admission: RE | Admit: 2017-03-21 | Discharge: 2017-03-21 | Disposition: A | Discharge status: Home / Self Care | Payer: Medicare Other | Source: Ambulatory Visit | Attending: Family Medicine | Admitting: Family Medicine

## 2017-03-21 ENCOUNTER — Ambulatory Visit: Payer: Medicare Other | Attending: Family Medicine | Admitting: Family Medicine

## 2017-03-21 VITALS — BP 133/92 | HR 64 | Temp 98.2°F | Resp 18 | Ht 64.0 in | Wt 185.0 lb

## 2017-03-21 DIAGNOSIS — N898 Other specified noninflammatory disorders of vagina: Secondary | ICD-10-CM | POA: Diagnosis not present

## 2017-03-21 DIAGNOSIS — Z124 Encounter for screening for malignant neoplasm of cervix: Secondary | ICD-10-CM | POA: Insufficient documentation

## 2017-03-21 DIAGNOSIS — R300 Dysuria: Secondary | ICD-10-CM | POA: Insufficient documentation

## 2017-03-21 DIAGNOSIS — Z01419 Encounter for gynecological examination (general) (routine) without abnormal findings: Secondary | ICD-10-CM | POA: Insufficient documentation

## 2017-03-21 DIAGNOSIS — I10 Essential (primary) hypertension: Secondary | ICD-10-CM

## 2017-03-21 DIAGNOSIS — Z79899 Other long term (current) drug therapy: Secondary | ICD-10-CM | POA: Diagnosis not present

## 2017-03-21 MED ORDER — HYDROCHLOROTHIAZIDE 25 MG PO TABS: 25.0000 mg | ORAL_TABLET | Freq: Every day | ORAL | 2 refills | Status: DC

## 2017-03-21 NOTE — Patient Instructions (Signed)
Breast Self-Awareness Breast self-awareness means:  Knowing how your breasts look.  Knowing how your breasts feel.  Checking your breasts every month for changes.  Telling your doctor if you notice a change in your breasts.  Breast self-awareness allows you to notice a breast problem early while it is still small. How to do a breast self-exam One way to learn what is normal for your breasts and to check for changes is to do a breast self-exam. To do a breast self-exam: Look for Changes  1. Take off all the clothes above your waist. 2. Stand in front of a mirror in a room with good lighting. 3. Put your hands on your hips. 4. Push your hands down. 5. Look at your breasts and nipples in the mirror to see if one breast or nipple looks different than the other. Check to see if: ? The shape of one breast is different. ? The size of one breast is different. ? There are wrinkles, dips, and bumps in one breast and not the other. 6. Look at each breast for changes in your skin, such as: ? Redness. ? Scaly areas. 7. Look for changes in your nipples, such as: ? Liquid around the nipples. ? Bleeding. ? Dimpling. ? Redness. ? A change in where the nipples are. Feel for Changes 1. Lie on your back on the floor. 2. Feel each breast. To do this, follow these steps: ? Pick a breast to feel. ? Put the arm closest to that breast above your head. ? Use your other arm to feel the nipple area of your breast. Feel the area with the pads of your three middle fingers by making small circles with your fingers. For the first circle, press lightly. For the second circle, press harder. For the third circle, press even harder. ? Keep making circles with your fingers at the light, harder, and even harder pressures as you move down your breast. Stop when you feel your ribs. ? Move your fingers a little toward the center of your body. ? Start making circles with your fingers again, this time going up until  you reach your collarbone. ? Keep making up and down circles until you reach your armpit. Remember to keep using the three pressures. ? Feel the other breast in the same way. 3. Sit or stand in the shower or tub. 4. With soapy water on your skin, feel each breast the same way you did in step 2, when you were lying on the floor. Write Down What You Find  After doing the self-exam, write down:  What is normal for each breast.  Any changes you find in each breast.  When you last had your period.  How often should I check my breasts? Check your breasts every month. If you are breastfeeding, the best time to check them is after you feed your baby or after you use a breast pump. If you get periods, the best time to check your breasts is 5-7 days after your period is over. When should I see my doctor? See your doctor if you notice:  A change in shape or size of your breasts or nipples.  A change in the skin of your breast or nipples, such as red or scaly skin.  Unusual fluid coming from your nipples.  A lump or thick area that was not there before.  Pain in your breasts.  Anything that concerns you.  This information is not intended to replace advice given to   you by your health care provider. Make sure you discuss any questions you have with your health care provider. Document Released: 03/06/2008 Document Revised: 02/24/2016 Document Reviewed: 08/08/2015 Elsevier Interactive Patient Education  2018 Elsevier Inc[employer] Pap Test Why am I having this test? A pap test is sometimes called a pap smear. It is a screening test that is used to check for signs of cancer of the vagina, cervix, and uterus. The test can also identify the presence of infection or precancerous changes. Your health care provider will likely recommend you have this test done on a regular basis. This test may be done:  Every 3 years, starting at age 21.  Every 5 years, in combination with testing for the presence of  human papillomavirus (HPV).  More or less often depending on other medical conditions.  What kind of sample is taken? Using a small cotton swab, plastic spatula, or brush, your health care provider will collect a sample of cells from the surface of your cervix. Your cervix is the opening to your uterus, also called a womb. Secretions from the cervix and vagina may also be collected. How do I prepare for this test?  Be aware of where you are in your menstrual cycle. You may be asked to reschedule the test if you are menstruating on the day of the test.  You may need to reschedule if you have a known vaginal infection on the day of the test.  You may be asked to avoid douching or taking a bath the day before or the day of the test.  Some medicines can cause abnormal test results, such as digitalis and tetracycline. Talk with your health care provider before your test if you take one of these medicines. What do the results mean? Abnormal test results may indicate a number of health conditions. These may include:  Cancer. Although pap test results cannot be used to diagnose cancer of the cervix, vagina, or uterus, they may suggest the possibility of cancer. Further tests would be required to determine if cancer is present.  Sexually transmitted disease.  Fungal infection.  Parasite infection.  Herpes infection.  A condition causing or contributing to infertility.  It is your responsibility to obtain your test results. Ask the lab or department performing the test when and how you will get your results. Contact your health care provider to discuss any questions you have about your results. Talk with your health care provider to discuss your results, treatment options, and if necessary, the need for more tests. Talk with your health care provider if you have any questions about your results. This information is not intended to replace advice given to you by your health care provider. Make  sure you discuss any questions you have with your health care provider. Document Released: 12/09/2002 Document Revised: 05/24/2016 Document Reviewed: 02/09/2014 Elsevier Interactive Patient Education  2018 Elsevier Inc.  

## 2017-03-21 NOTE — Progress Notes (Signed)
Patient is here for f/up HTN/PAP  Patient has not taking her medication for today   Patient has not eaten for today

## 2017-03-21 NOTE — Progress Notes (Signed)
Subjective:  Patient ID: Jody [name], female    DOB: January 27, 1971  Age: 46 y.o. MRN: [id number]  CC: Hypertension and Gynecologic Exam   HPI Jody SpruceRosalind L Olejniczak presents for well woman visit with routine gynecological exam. She denies any family history of breast or gynecological cancers. She denies any nipple discharge, lumps, denting or dimpling of the breast. She does not perform monthly SBE.She denies any vaginal discharge. Lesions, or dysuria. She declines STI testing at this time.  History of HTN: She ambulates regularly for  exercise and is not adherent to low salt diet. BP slightly elevated in office today. She reports compliance with anti-hypertenisve medication.She denies any cardiac symptoms none.  Cardiovascular risk factors: hypertension. Use of agents associated with hypertension: NSAIDS. History of target organ damage: none.  Outpatient Medications Prior to Visit  Medication Sig Dispense Refill  . ALPRAZolam (XANAX) 0.25 MG tablet Take 0.25 mg by mouth 2 (two) times daily.    . benztropine (COGENTIN) 1 MG tablet Take 0.5 mg by mouth at bedtime.     . divalproex (DEPAKOTE ER) 500 MG 24 hr tablet Take 1,500 mg by mouth at bedtime.     . donepezil (ARICEPT) 10 MG tablet Take 1 tablet (10 mg total) by mouth 2 (two) times daily. 60 tablet 11  . FLUoxetine (PROZAC) 40 MG capsule Take 20 mg by mouth daily.     Marland Kitchen. ibuprofen (ADVIL,MOTRIN) 600 MG tablet Take 1 tablet (600 mg total) by mouth every 8 (eight) hours as needed. 30 tablet 0  . lurasidone (LATUDA) 40 MG TABS tablet Take 40 mg by mouth daily with supper.     . ranitidine (ZANTAC 75) 75 MG tablet Take 1 tablet (75 mg total) by mouth 2 (two) times daily as needed for heartburn.    . risperiDONE (RISPERDAL) 2 MG tablet Take 2 mg by mouth daily.     . traZODone (DESYREL) 100 MG tablet Take 1 tablet (100 mg total) by mouth at bedtime as needed for sleep. (Patient not taking: Reported on 01/30/2017) 30 tablet 0  . trimethoprim  (TRIMPEX) 100 MG tablet Take 100 mg by mouth daily.    Marland Kitchen. zolpidem (AMBIEN) 10 MG tablet Take 10 mg by mouth daily.    . hydrochlorothiazide (HYDRODIURIL) 12.5 MG tablet Take 1 tablet (12.5 mg total) by mouth daily. 30 tablet 2   No facility-administered medications prior to visit.     ROS Review of Systems  Constitutional: Negative.   Respiratory: Negative.   Cardiovascular: Negative.   Gastrointestinal: Negative.   Genitourinary: Negative.   Skin: Negative.   Neurological: Negative.   Psychiatric/Behavioral: Negative for suicidal ideas.    Objective:  BP (!) 133/92 (BP Location: Left Arm, Patient Position: Sitting, Cuff Size: Normal)   Pulse 64   Temp 98.2 F (36.8 C) (Oral)   Resp 18   Ht 5\' 4"  (1.626 m)   Wt 185 lb (83.9 kg)   SpO2 99%   BMI 31.76 kg/m   BP/Weight 03/21/2017 02/01/2017 01/31/2017  Systolic BP 133 128 131  Diastolic BP 92 91 93  Wt. (Lbs) 185 179 178.6  BMI 31.76 30.73 30.66  Some encounter information is confidential and restricted. Go to Review Flowsheets activity to see all data.     Physical Exam  Eyes: Conjunctivae are normal. Pupils are equal, round, and reactive to light.  Neck: No JVD present.  Cardiovascular: Normal rate, regular rhythm, normal heart sounds and intact distal pulses.   Pulmonary/Chest: Effort normal  and breath sounds normal. Right breast exhibits no mass, no nipple discharge, no skin change and no tenderness. Left breast exhibits no mass, no nipple discharge, no skin change and no tenderness. Breasts are symmetrical.  Abdominal: Soft. Bowel sounds are normal.  Genitourinary: There is no lesion on the left labia. Vaginal discharge (thick, non-odorus, mucoid discharge.) found.  Skin: Skin is warm and dry.  Psychiatric: She expresses no homicidal and no suicidal ideation. She expresses no suicidal plans and no homicidal plans.  Nursing note and vitals reviewed.   Assessment & Plan:   Problem List Items Addressed This Visit     None    Visit Diagnoses    Well woman exam with routine gynecological exam    -  Primary   Relevant Orders   Cytology - PAP [place]   Essential hypertension       Schedule BP recheck in 2 weeks with clinic RN..   If BP is greater than 90/60 (MAP 65 or greater) but not less than 130/80 may add amlodipine    2.5 mg QD and recheck in another 2 weeks with clinic RN.   Relevant Medications   hydrochlorothiazide (HYDRODIURIL) 25 MG tablet   Vaginal discharge       Asymptomatic. Will screen for BV/ yeast.    Relevant Orders   Cytology - PAP [place]      Meds ordered this encounter  Medications  . hydrochlorothiazide (HYDRODIURIL) 25 MG tablet    Sig: Take 1 tablet (25 mg total) by mouth daily.    Dispense:  30 tablet    Refill:  2    Order Specific Question:   Supervising Provider    Answer:   Quentin Angst [id number]    Follow-up: Return in about 2 weeks (around 04/04/2017) for BP check with clinic RN.  Lizbeth Bark FNP

## 2017-03-22 LAB — CYTOLOGY - PAP
Adequacy: ABSENT
BACTERIAL VAGINITIS: NEGATIVE
CANDIDA VAGINITIS: POSITIVE — AB
Diagnosis: NEGATIVE
HPV: NOT DETECTED

## 2017-03-29 ENCOUNTER — Other Ambulatory Visit: Payer: Self-pay | Admitting: Family Medicine

## 2017-03-29 DIAGNOSIS — B373 Candidiasis of vulva and vagina: Secondary | ICD-10-CM

## 2017-03-29 DIAGNOSIS — B3731 Acute candidiasis of vulva and vagina: Secondary | ICD-10-CM

## 2017-03-29 MED ORDER — FLUCONAZOLE 150 MG PO TABS: 150.0000 mg | ORAL_TABLET | Freq: Once | ORAL | 0 refills | Status: AC

## 2017-04-02 ENCOUNTER — Telehealth: Payer: Self-pay

## 2017-04-02 NOTE — Telephone Encounter (Signed)
-----   Message from Lizbeth BarkMandesia R Hairston, FNP sent at 03/29/2017  7:53 AM EDT ----- Pap smear showed no lesions or malignancy. BV is negative. Yeast is positive. You will be prescribed diflucan to treat. To reduce your risk of developing yeast don't douche, don't use scented soap or sprays.

## 2017-04-02 NOTE — Telephone Encounter (Signed)
CMA call regarding results  Patient did not answer but left a VM stating the reason of the call & to call me back

## 2017-04-06 ENCOUNTER — Ambulatory Visit: Payer: Medicare Other | Attending: Family Medicine | Admitting: *Deleted

## 2017-04-06 VITALS — BP 124/80 | HR 74 | Resp 12

## 2017-04-06 DIAGNOSIS — I10 Essential (primary) hypertension: Secondary | ICD-10-CM | POA: Insufficient documentation

## 2017-04-06 DIAGNOSIS — Z79899 Other long term (current) drug therapy: Secondary | ICD-10-CM | POA: Insufficient documentation

## 2017-04-06 DIAGNOSIS — Z0130 Encounter for examination of blood pressure without abnormal findings: Secondary | ICD-10-CM | POA: Diagnosis not present

## 2017-04-06 NOTE — Progress Notes (Signed)
[facility] and oriented.  Last OV 03/21/2017  with M.Hairston,FNP.  Pt denies chest pain, SOB, HA, dizziness, or blurred vision. Verified medication. Pt states medication was taken this morning.  Manual blood pressure reading: 124/80

## 2017-04-16 ENCOUNTER — Telehealth: Payer: Self-pay | Admitting: Neurology

## 2017-04-16 NOTE — Telephone Encounter (Signed)
Faxed paperwork received, placed in "to sign" box for MD signature.

## 2017-04-16 NOTE — Telephone Encounter (Signed)
Juliette AlcideMelinda with [facility] is calling to see if office received a fax regarding skilled nursing for patient and a signature was needed. It was faxed on 04-12-17. Please call and advise.

## 2017-05-21 ENCOUNTER — Ambulatory Visit: Payer: Medicare Other | Attending: Family Medicine | Admitting: *Deleted

## 2017-05-21 VITALS — BP 130/84 | HR 65 | Temp 98.5°F | Resp 16

## 2017-05-21 DIAGNOSIS — R300 Dysuria: Secondary | ICD-10-CM | POA: Diagnosis not present

## 2017-05-21 LAB — POCT URINALYSIS DIPSTICK
Bilirubin, UA: NEGATIVE
Blood, UA: NEGATIVE
Glucose, UA: NEGATIVE
Ketones, UA: NEGATIVE
NITRITE UA: NEGATIVE
PH UA: 6 (ref 5.0–8.0)
Protein, UA: NEGATIVE
Spec Grav, UA: 1.015 (ref 1.010–1.025)
Urobilinogen, UA: 0.2 E.U./dL

## 2017-05-21 MED ORDER — CIPROFLOXACIN HCL 250 MG PO TABS: 250.0000 mg | ORAL_TABLET | Freq: Two times a day (BID) | ORAL | 0 refills | Status: DC

## 2017-05-21 MED FILL — CIPROFLOXACIN HCL 250 MG TA: 250 | 3 days supply | Qty: 6 | Fill #0

## 2017-05-21 NOTE — Progress Notes (Signed)
Patient Triage Assessment Form  Todays Date: 05/21/2017 Name: Jody [name] DOB: December 05, 1970 Reason for walkin: concern for UTI When did your symptoms start? Last night while asleep Please list symptoms: frequent urination, dysuria And Bladder discomfort/ dull pelvic pain Denies discharge, fevers Has h/o Interstitial Cystitis  Are you having pain: yes 3/10  Assessment Vital Signs:  T: 98.5 oral P: 65  R: 20 SpO2: 96 BP: 130/84   Plan cipro 250 mg BID x 3 days Return if symptoms worsen or do not improve

## 2017-05-21 NOTE — Patient Instructions (Signed)
Plan Cipro 250 mg twice daily  x 3 days Please call or make appointment with PCP if sx's do not improve

## 2017-05-25 ENCOUNTER — Other Ambulatory Visit: Payer: Self-pay | Admitting: Family Medicine

## 2017-05-25 DIAGNOSIS — I10 Essential (primary) hypertension: Secondary | ICD-10-CM

## 2017-06-14 DIAGNOSIS — N3010 Interstitial cystitis (chronic) without hematuria: Secondary | ICD-10-CM | POA: Diagnosis not present

## 2017-06-14 DIAGNOSIS — R351 Nocturia: Secondary | ICD-10-CM | POA: Diagnosis not present

## 2017-06-21 ENCOUNTER — Telehealth: Payer: Self-pay | Admitting: Neurology

## 2017-06-21 ENCOUNTER — Telehealth: Payer: Self-pay

## 2017-06-21 NOTE — Telephone Encounter (Signed)
I don't need to see her again, please inform her that since neuropsych testing was consistent with bipolar disease then she canb follow up with her psychiatrist please cancel appt and make note of this. I cannot approve her to drive at this time, she should f/u with psychiatry for this thanks

## 2017-06-21 NOTE — Telephone Encounter (Signed)
Message will be sent to Dr. Lucia Gaskins to evaluate her chart about driving.
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
## 2017-06-21 NOTE — Telephone Encounter (Signed)
PT wants to drive. Dr .Lucia Gaskins advised me so I can call patient back.

## 2017-06-21 NOTE — Telephone Encounter (Signed)
Rn call patients sister Rene Kocher about her sister wanting to drive .She is on the dpr form. Rn stated per Dr. Lucia Gaskins note, she cannot approve her to drive.  Also Dr.Ahern wants pt to follow up with her psychiatrist, because her testing is consistent with bipolar disorder. Rn stated Dr. Lucia Gaskins does not need to evaluate her in 08/2017,and the appt will be cancel. [name] pts sister verbalized understanding,and will tell her sister. Appt cancel, pt needs to follow up with psychiatrist ongoing not Dr. Lucia Gaskins.

## 2017-07-06 NOTE — Telephone Encounter (Signed)
ERROR

## 2017-08-06 ENCOUNTER — Ambulatory Visit: Payer: Medicare Other | Admitting: Neurology

## 2017-08-20 ENCOUNTER — Other Ambulatory Visit: Payer: Self-pay | Admitting: Family Medicine

## 2017-08-20 DIAGNOSIS — I10 Essential (primary) hypertension: Secondary | ICD-10-CM

## 2017-09-14 ENCOUNTER — Other Ambulatory Visit: Payer: Self-pay | Admitting: Family Medicine

## 2017-09-14 DIAGNOSIS — I10 Essential (primary) hypertension: Secondary | ICD-10-CM

## 2017-09-17 NOTE — Telephone Encounter (Signed)
CMA call regarding medication refill  Patient was aware and understood   

## 2017-09-27 DIAGNOSIS — N390 Urinary tract infection, site not specified: Secondary | ICD-10-CM | POA: Diagnosis not present

## 2017-10-11 ENCOUNTER — Other Ambulatory Visit: Payer: Self-pay | Admitting: Family Medicine

## 2017-10-11 DIAGNOSIS — I10 Essential (primary) hypertension: Secondary | ICD-10-CM

## 2017-10-31 ENCOUNTER — Other Ambulatory Visit: Payer: Self-pay | Admitting: Neurology

## 2018-01-07 ENCOUNTER — Other Ambulatory Visit: Payer: Self-pay | Admitting: Neurology

## 2018-01-08 ENCOUNTER — Ambulatory Visit: Payer: Medicare Other | Attending: Nurse Practitioner | Admitting: Nurse Practitioner

## 2018-01-08 ENCOUNTER — Encounter: Payer: Self-pay | Admitting: Nurse Practitioner

## 2018-01-08 VITALS — BP 134/90 | HR 74 | Temp 98.5°F | Ht 64.0 in | Wt 183.8 lb

## 2018-01-08 DIAGNOSIS — I10 Essential (primary) hypertension: Secondary | ICD-10-CM | POA: Insufficient documentation

## 2018-01-08 DIAGNOSIS — Z880 Allergy status to penicillin: Secondary | ICD-10-CM | POA: Diagnosis not present

## 2018-01-08 DIAGNOSIS — F319 Bipolar disorder, unspecified: Secondary | ICD-10-CM | POA: Insufficient documentation

## 2018-01-08 DIAGNOSIS — Z9114 Patient's other noncompliance with medication regimen: Secondary | ICD-10-CM | POA: Diagnosis not present

## 2018-01-08 DIAGNOSIS — Z8249 Family history of ischemic heart disease and other diseases of the circulatory system: Secondary | ICD-10-CM | POA: Diagnosis not present

## 2018-01-08 DIAGNOSIS — Z760 Encounter for issue of repeat prescription: Secondary | ICD-10-CM | POA: Diagnosis not present

## 2018-01-08 DIAGNOSIS — Z791 Long term (current) use of non-steroidal anti-inflammatories (NSAID): Secondary | ICD-10-CM | POA: Diagnosis not present

## 2018-01-08 DIAGNOSIS — Z79899 Other long term (current) drug therapy: Secondary | ICD-10-CM | POA: Insufficient documentation

## 2018-01-08 DIAGNOSIS — F333 Major depressive disorder, recurrent, severe with psychotic symptoms: Secondary | ICD-10-CM | POA: Diagnosis not present

## 2018-01-08 DIAGNOSIS — Z833 Family history of diabetes mellitus: Secondary | ICD-10-CM | POA: Diagnosis not present

## 2018-01-08 DIAGNOSIS — Z9851 Tubal ligation status: Secondary | ICD-10-CM | POA: Insufficient documentation

## 2018-01-08 DIAGNOSIS — D509 Iron deficiency anemia, unspecified: Secondary | ICD-10-CM | POA: Insufficient documentation

## 2018-01-08 DIAGNOSIS — F418 Other specified anxiety disorders: Secondary | ICD-10-CM | POA: Diagnosis not present

## 2018-01-08 DIAGNOSIS — Z9889 Other specified postprocedural states: Secondary | ICD-10-CM | POA: Insufficient documentation

## 2018-01-08 DIAGNOSIS — F410 Panic disorder [episodic paroxysmal anxiety] without agoraphobia: Secondary | ICD-10-CM | POA: Diagnosis not present

## 2018-01-08 DIAGNOSIS — F200 Paranoid schizophrenia: Secondary | ICD-10-CM

## 2018-01-08 MED ORDER — HYDROCHLOROTHIAZIDE 25 MG PO TABS: 25.0000 mg | ORAL_TABLET | Freq: Every day | ORAL | 1 refills | Status: DC

## 2018-01-08 MED FILL — HYDROCHLOROTHIAZIDE 25 MG T: 25 | 30 days supply | Qty: 30 | Fill #0
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
## 2018-01-08 NOTE — Patient Instructions (Signed)
Syncope Syncope is when you lose temporarily pass out (faint). Signs that you may be about to pass out include:  Feeling dizzy or light-headed.  Feeling sick to your stomach (nauseous).  Seeing all white or all black.  Having cold, clammy skin.  If you passed out, get help right away. Call your local emergency services (911 in the [place] Do not drive yourself to the hospital. Follow these instructions at home: Pay attention to any changes in your symptoms. Take these actions to help with your condition:  Have someone stay with you until you feel stable.  Do not drive, use machinery, or play sports until your doctor says it is okay.  Keep all follow-up visits as told by your doctor. This is important.  If you start to feel like you might pass out, lie down right away and raise (elevate) your feet above the level of your heart. Breathe deeply and steadily. Wait until all of the symptoms are gone.  Drink enough fluid to keep your pee (urine) clear or pale yellow.  If you are taking blood pressure or heart medicine, get up slowly and spend many minutes getting ready to sit and then stand. This can help with dizziness.  Take over-the-counter and prescription medicines only as told by your doctor.  Get help right away if:  You have a very bad headache.  You have unusual pain in your chest, tummy, or back.  You are bleeding from your mouth or rectum.  You have black or tarry poop (stool).  You have a very fast or uneven heartbeat (palpitations).  It hurts to breathe.  You pass out once or more than once.  You have jerky movements that you cannot control (seizure).  You are confused.  You have trouble walking.  You are very weak.  You have vision problems. These symptoms may be an emergency. Do not wait to see if the symptoms will go away. Get medical help right away. Call your local emergency services (911 in the [place] Do not drive yourself to the hospital. This  information is not intended to replace advice given to you by your health care provider. Make sure you discuss any questions you have with your health care provider. Document Released: 03/06/2008 Document Revised: 02/24/2016 Document Reviewed: 06/02/2015 Elsevier Interactive Patient Education  2018 [employer].  Seizure, Adult When you have a seizure:  Parts of your body may move.  How aware or awake (conscious) you are may change.  You may shake (convulse).  Some people have symptoms right before a seizure happens. These symptoms may include:  Fear.  Worry (anxiety).  Feeling like you are going to throw up (nausea).  Feeling like the room is spinning (vertigo).  Feeling like you saw or heard something before (deja vu).  Odd tastes or smells.  Changes in vision, such as seeing flashing lights or spots.  Seizures usually last from 30 seconds to 2 minutes. Usually, they are not harmful unless they last a long time. Follow these instructions at home: Medicines  Take over-the-counter and prescription medicines only as told by your doctor.  Avoid anything that may keep your medicine from working, such as alcohol. Activity  Do not do any activities that would be dangerous if you had another seizure, like driving or swimming. Wait until your doctor approves.  If you live in the [place] ask your local DMV (department of motor vehicles) when you can drive.  Rest. Teaching others  Teach friends and family what  to do when you have a seizure. They should: ? Lay you on the ground. ? Protect your head and body. ? Loosen any tight clothing around your neck. ? Turn you on your side. ? Stay with you until you are better. ? Not hold you down. ? Not put anything in your mouth. ? Know whether or not you need emergency care. General instructions  Contact your doctor each time you have a seizure.  Avoid anything that gives you seizures.  Keep a seizure diary. Write down: ? What  you think caused each seizure. ? What you remember about each seizure.  Keep all follow-up visits as told by your doctor. This is important. Contact a doctor if:  You have another seizure.  You have seizures more often.  There is any change in what happens during your seizures.  You continue to have seizures with treatment.  You have symptoms of being sick or having an infection. Get help right away if:  You have a seizure: ? That lasts longer than 5 minutes. ? That is different than seizures you had before. ? That makes it harder to breathe. ? After you hurt your head.  After a seizure, you cannot speak or use a part of your body.  After a seizure, you are confused or have a bad headache.  You have two or more seizures in a row.  You are having seizures more often.  You do not wake up right after a seizure.  You get hurt during a seizure. In an emergency:  These symptoms may be an emergency. Do not wait to see if the symptoms will go away. Get medical help right away. Call your local emergency services (911 in the [place] Do not drive yourself to the hospital. This information is not intended to replace advice given to you by your health care provider. Make sure you discuss any questions you have with your health care provider. Document Released: 03/06/2008 Document Revised: 05/31/2016 Document Reviewed: 05/31/2016 Elsevier Interactive Patient Education  2017 [employer]

## 2018-01-09 ENCOUNTER — Ambulatory Visit: Payer: Medicare Other | Admitting: Neurology

## 2018-01-09 ENCOUNTER — Ambulatory Visit: Payer: Medicare Other | Admitting: Nurse Practitioner

## 2018-01-09 ENCOUNTER — Encounter: Payer: Self-pay | Admitting: Nurse Practitioner

## 2018-01-09 LAB — CMP14+EGFR
ALBUMIN: 4.3 g/dL (ref 3.5–5.5)
ALK PHOS: 92 IU/L (ref 39–117)
ALT: 37 IU/L — ABNORMAL HIGH (ref 0–32)
AST: 29 IU/L (ref 0–40)
Albumin/Globulin Ratio: 2 (ref 1.2–2.2)
BILIRUBIN TOTAL: 0.3 mg/dL (ref 0.0–1.2)
BUN / CREAT RATIO: 8 — AB (ref 9–23)
BUN: 9 mg/dL (ref 6–24)
CHLORIDE: 107 mmol/L — AB (ref 96–106)
CO2: 20 mmol/L (ref 20–29)
CREATININE: 1.14 mg/dL — AB (ref 0.57–1.00)
Calcium: 9.1 mg/dL (ref 8.7–10.2)
GFR calc Af Amer: 68 mL/min/{1.73_m2} (ref >59)
GFR calc non Af Amer: 59 mL/min/{1.73_m2} — ABNORMAL LOW (ref >59)
GLOBULIN, TOTAL: 2.2 g/dL (ref 1.5–4.5)
Glucose: 97 mg/dL (ref 65–99)
Potassium: 4.5 mmol/L (ref 3.5–5.2)
Sodium: 141 mmol/L (ref 134–144)
Total Protein: 6.5 g/dL (ref 6.0–8.5)

## 2018-01-09 LAB — CBC
Hematocrit: 39.2 % (ref 34.0–46.6)
Hemoglobin: 12.1 g/dL (ref 11.1–15.9)
MCH: 22.4 pg — ABNORMAL LOW (ref 26.6–33.0)
MCHC: 30.9 g/dL — ABNORMAL LOW (ref 31.5–35.7)
MCV: 73 fL — ABNORMAL LOW (ref 79–97)
PLATELETS: 266 10*3/uL (ref 150–379)
RBC: 5.39 x10E6/uL — AB (ref 3.77–5.28)
RDW: 16.4 % — ABNORMAL HIGH (ref 12.3–15.4)
WBC: 4 10*3/uL (ref 3.4–10.8)

## 2018-01-09 LAB — LIPID PANEL
CHOLESTEROL TOTAL: 180 mg/dL (ref 100–199)
Chol/HDL Ratio: 4.1 ratio (ref 0.0–4.4)
HDL: 44 mg/dL (ref >39)
LDL Calculated: 113 mg/dL — ABNORMAL HIGH (ref 0–99)
Triglycerides: 113 mg/dL (ref 0–149)
VLDL CHOLESTEROL CAL: 23 mg/dL (ref 5–40)

## 2018-01-11 ENCOUNTER — Telehealth: Payer: Self-pay

## 2018-01-11 NOTE — Telephone Encounter (Signed)
-----   Message from Claiborne RiggZelda W Fleming, NP sent at 01/09/2018  3:45 PM EDT ----- CBC shows anemia. I would suggest possibly taking an iron tablet 325mg  every other day. You can get this OTC. Other labs are essentially normal. Make sure you are drinking at least 48 oz of water per day. Work on eating a low fat, heart healthy diet and participate in regular aerobic exercise program to control as well. Exercise at least 150 minutes per week to help lower your LDL cholesterol level

## 2018-01-11 NOTE — Telephone Encounter (Signed)
CMA attempt to call patient. No answer and left a VM for patient to call back. If patient call back, please inform:  CBC shows anemia. I would suggest possibly taking an iron tablet 325mg  every other day. You can get this OTC. Other labs are essentially normal. Make sure you are drinking at least 48 oz of water per day. Work on eating a low fat, heart healthy diet and participate in regular aerobic exercise program to control as well. Exercise at least 150 minutes per week to help lower your LDL cholesterol level

## 2018-02-07 ENCOUNTER — Ambulatory Visit (INDEPENDENT_AMBULATORY_CARE_PROVIDER_SITE_OTHER): Payer: Self-pay | Admitting: Nurse Practitioner

## 2018-02-08 ENCOUNTER — Other Ambulatory Visit: Payer: Self-pay | Admitting: Neurology

## 2018-03-07 ENCOUNTER — Ambulatory Visit: Payer: Medicare Other | Admitting: Neurology

## 2018-04-23 ENCOUNTER — Telehealth: Payer: Self-pay | Admitting: Neurology

## 2018-04-23 NOTE — Telephone Encounter (Signed)
Spoke to patient. Formal neurocognitive testing showed her cognitive disorder due to her schizophrenia and not alzheimers or other neurodegenerative neurologic dementia. Recommendations were to follow with osychiatry not neurology. I spoek to patient, would gladly see her if she wanted to come in but she declined and was happy not to come. If she needs me in the future she is welcome to come back.

## 2018-04-24 ENCOUNTER — Encounter

## 2018-04-24 ENCOUNTER — Ambulatory Visit: Payer: Medicare Other | Admitting: Neurology

## 2018-04-30 ENCOUNTER — Other Ambulatory Visit: Payer: Self-pay | Admitting: Neurology

## 2018-04-30 NOTE — Telephone Encounter (Signed)
Patient can stop Aricept, this medication is for dementia which has been ruled out for her by formal neurocognitive testing thanks

## 2018-05-01 NOTE — Telephone Encounter (Signed)
Called pt & LVM asking for call back.  

## 2018-05-02 NOTE — Telephone Encounter (Signed)
Called pt & LVM (ok per DPR) informing pt that we received a refill request for her Aricept. However that medication is for Dementia which was ruled out by formal neurocognitive testing. Dr. Lucia GaskinsAhern said she can stop the Aricept. She does not need to take it anymore. Left office number and encouraged pt to please call back with any questions.

## 2018-05-07 NOTE — Addendum Note (Signed)
Addended by: Bertram SavinULBERTSON, BETHANY L on: 05/07/2018 05:10 PM   Modules accepted: Orders

## 2018-05-07 NOTE — Telephone Encounter (Signed)
Spoke with pt on her home phone. Advised her that we received a refill request for Aricept (Donepezil) however this medication is for Dementia and that was ruled out by the neurocognitive testing. Dr. Lucia GaskinsAhern says pt does not need to take this medication anymore and she can stop it. Patient verbalized understanding and appreciation.

## 2018-05-07 NOTE — Telephone Encounter (Signed)
Tried to reach patient again. No answer.

## 2018-05-10 ENCOUNTER — Ambulatory Visit: Payer: Self-pay | Admitting: Nurse Practitioner

## 2018-05-21 DIAGNOSIS — N390 Urinary tract infection, site not specified: Secondary | ICD-10-CM | POA: Diagnosis not present

## 2018-05-21 DIAGNOSIS — R300 Dysuria: Secondary | ICD-10-CM | POA: Diagnosis not present

## 2018-06-10 ENCOUNTER — Encounter: Payer: Self-pay | Admitting: Nurse Practitioner

## 2018-06-10 ENCOUNTER — Ambulatory Visit: Payer: Medicare Other | Attending: Nurse Practitioner | Admitting: Nurse Practitioner

## 2018-06-10 VITALS — BP 138/89 | HR 90 | Temp 98.8°F | Ht 64.0 in | Wt 180.6 lb

## 2018-06-10 DIAGNOSIS — Z862 Personal history of diseases of the blood and blood-forming organs and certain disorders involving the immune mechanism: Secondary | ICD-10-CM

## 2018-06-10 DIAGNOSIS — I10 Essential (primary) hypertension: Secondary | ICD-10-CM

## 2018-06-10 DIAGNOSIS — Z760 Encounter for issue of repeat prescription: Secondary | ICD-10-CM | POA: Diagnosis not present

## 2018-06-10 DIAGNOSIS — D649 Anemia, unspecified: Secondary | ICD-10-CM | POA: Insufficient documentation

## 2018-06-10 DIAGNOSIS — F319 Bipolar disorder, unspecified: Secondary | ICD-10-CM | POA: Insufficient documentation

## 2018-06-10 DIAGNOSIS — Z79899 Other long term (current) drug therapy: Secondary | ICD-10-CM | POA: Insufficient documentation

## 2018-06-10 DIAGNOSIS — F200 Paranoid schizophrenia: Secondary | ICD-10-CM | POA: Diagnosis not present

## 2018-06-10 MED ORDER — HYDROCHLOROTHIAZIDE 25 MG PO TABS: 25.0000 mg | ORAL_TABLET | Freq: Every day | ORAL | 1 refills | Status: DC

## 2018-06-10 MED FILL — HYDROCHLOROTHIAZIDE 25 MG T: 25 | 30 days supply | Qty: 30 | Fill #0
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
## 2018-06-11 LAB — CMP14+EGFR
A/G RATIO: 1.8 (ref 1.2–2.2)
ALBUMIN: 4.6 g/dL (ref 3.5–5.5)
ALK PHOS: 84 IU/L (ref 39–117)
ALT: 48 IU/L — ABNORMAL HIGH (ref 0–32)
AST: 35 IU/L (ref 0–40)
BILIRUBIN TOTAL: 0.4 mg/dL (ref 0.0–1.2)
BUN / CREAT RATIO: 10 (ref 9–23)
BUN: 10 mg/dL (ref 6–24)
CHLORIDE: 105 mmol/L (ref 96–106)
CO2: 22 mmol/L (ref 20–29)
Calcium: 9.4 mg/dL (ref 8.7–10.2)
Creatinine, Ser: 0.98 mg/dL (ref 0.57–1.00)
GFR calc non Af Amer: 70 mL/min/{1.73_m2} (ref >59)
GFR, EST AFRICAN AMERICAN: 81 mL/min/{1.73_m2} (ref >59)
GLOBULIN, TOTAL: 2.5 g/dL (ref 1.5–4.5)
Glucose: 82 mg/dL (ref 65–99)
Potassium: 4.6 mmol/L (ref 3.5–5.2)
SODIUM: 141 mmol/L (ref 134–144)
TOTAL PROTEIN: 7.1 g/dL (ref 6.0–8.5)

## 2018-06-11 LAB — CBC
HEMATOCRIT: 41.6 % (ref 34.0–46.6)
HEMOGLOBIN: 12.9 g/dL (ref 11.1–15.9)
MCH: 23.3 pg — AB (ref 26.6–33.0)
MCHC: 31 g/dL — AB (ref 31.5–35.7)
MCV: 75 fL — AB (ref 79–97)
Platelets: 219 10*3/uL (ref 150–450)
RBC: 5.54 x10E6/uL — AB (ref 3.77–5.28)
RDW: 14.9 % (ref 12.3–15.4)
WBC: 4.4 10*3/uL (ref 3.4–10.8)

## 2018-06-11 LAB — IRON,TIBC AND FERRITIN PANEL
Ferritin: 66 ng/mL (ref 15–150)
IRON SATURATION: 30 % (ref 15–55)
Iron: 91 ug/dL (ref 27–159)
TIBC: 303 ug/dL (ref 250–450)
UIBC: 212 ug/dL (ref 131–425)

## 2018-06-12 ENCOUNTER — Telehealth: Payer: Self-pay

## 2018-06-12 NOTE — Telephone Encounter (Signed)
CMA spoke to patient's sister who is on her DPR.  Patient's sister was inform of results.

## 2018-06-12 NOTE — Telephone Encounter (Signed)
-----   Message from Claiborne Rigg, NP sent at 06/11/2018  5:47 PM EDT ----- Kidney function and iron studies are normal. Labs do not show iron deficiency anemia at this time.

## 2018-07-04 DIAGNOSIS — N390 Urinary tract infection, site not specified: Secondary | ICD-10-CM | POA: Diagnosis not present

## 2018-07-04 DIAGNOSIS — R300 Dysuria: Secondary | ICD-10-CM | POA: Diagnosis not present

## 2018-07-04 DIAGNOSIS — N3946 Mixed incontinence: Secondary | ICD-10-CM | POA: Diagnosis not present

## 2018-07-08 ENCOUNTER — Telehealth: Payer: Self-pay | Admitting: Nurse Practitioner

## 2018-07-08 NOTE — Telephone Encounter (Signed)
Will route to PCP 

## 2018-07-08 NOTE — Telephone Encounter (Signed)
Patient came by to pickup paperwork. Please follow up with patient once paperwork is ready.

## 2018-07-09 NOTE — Telephone Encounter (Signed)
Her paperwork is ready

## 2018-07-10 NOTE — Telephone Encounter (Signed)
CMA attempt to reach patient to inform her paperwork is ready for pick up.  No answer and left a Vm.

## 2018-08-01 DIAGNOSIS — R351 Nocturia: Secondary | ICD-10-CM | POA: Diagnosis not present

## 2018-08-01 DIAGNOSIS — N941 Unspecified dyspareunia: Secondary | ICD-10-CM | POA: Diagnosis not present

## 2018-08-01 DIAGNOSIS — N3946 Mixed incontinence: Secondary | ICD-10-CM | POA: Diagnosis not present

## 2018-08-01 DIAGNOSIS — R300 Dysuria: Secondary | ICD-10-CM | POA: Diagnosis not present

## 2018-08-01 DIAGNOSIS — M6281 Muscle weakness (generalized): Secondary | ICD-10-CM | POA: Diagnosis not present

## 2018-08-01 DIAGNOSIS — M62838 Other muscle spasm: Secondary | ICD-10-CM | POA: Diagnosis not present

## 2018-08-15 DIAGNOSIS — N3946 Mixed incontinence: Secondary | ICD-10-CM | POA: Diagnosis not present

## 2018-08-15 DIAGNOSIS — R300 Dysuria: Secondary | ICD-10-CM | POA: Diagnosis not present

## 2018-09-09 ENCOUNTER — Encounter: Payer: Medicare Other | Admitting: Nurse Practitioner

## 2018-12-26 ENCOUNTER — Other Ambulatory Visit: Payer: Self-pay | Admitting: Nurse Practitioner

## 2018-12-26 DIAGNOSIS — I10 Essential (primary) hypertension: Secondary | ICD-10-CM

## 2019-02-20 ENCOUNTER — Ambulatory Visit: Payer: Medicare Other | Attending: Nurse Practitioner | Admitting: Physician Assistant

## 2019-02-20 DIAGNOSIS — B009 Herpesviral infection, unspecified: Secondary | ICD-10-CM

## 2019-02-20 MED ORDER — VALACYCLOVIR HCL 1 G PO TABS: 2000.0000 mg | ORAL_TABLET | Freq: Two times a day (BID) | ORAL | 3 refills | Status: DC

## 2019-02-20 MED FILL — valACYclovir HCL 1 GM TABS: 1 | 30 days supply | Qty: 30 | Fill #0

## 2019-02-20 NOTE — Progress Notes (Signed)
Virtual Visit via Telephone Note  I connected with Jody [name] on 02/20/19 at 10:50 AM EDT by telephone and verified that I am speaking with the correct person using two identifiers.   I discussed the limitations, risks, security and privacy concerns of performing an evaluation and management service by telephone and the availability of in person appointments. I also discussed with the patient that there may be a patient responsible charge related to this service. The patient expressed understanding and agreed to proceed.  Patient location:  Home  My Location:  [facility] office Persons on the call:  Myself and the patient   History of Present Illness: patient has sores in her mouth that are tender.  She went to [facility] for this a couple of years ago.  The lesions were swabbed and she was told it was herpes.  She does not know which type.  She denies ever having genital lesions.      Observations/Objective:  A&Ox3.  TP linear.  Speech is clear   Assessment and Plan: 1. HSV infection -verbal herpes education and websites given - valACYclovir (VALTREX) 1000 MG tablet; Take 2 tablets (2,000 mg total) by mouth 2 (two) times daily. X 1 day prn outbreak  Dispense: 30 tablet; Refill: 3   Follow Up Instructions: F/up prn with PCP   I discussed the assessment and treatment plan with the patient. The patient was provided an opportunity to ask questions and all were answered. The patient agreed with the plan and demonstrated an understanding of the instructions.   The patient was advised to call back or seek an in-person evaluation if the symptoms worsen or if the condition fails to improve as anticipated.  I provided 11 minutes of non-face-to-face time during this encounter.   Jody Co, PA-C  Patient ID: Jody [name], female   DOB: 25-Jan-1971, 48 y.o.   MRN: [id number]

## 2019-03-10 ENCOUNTER — Other Ambulatory Visit: Payer: Self-pay | Admitting: Nurse Practitioner

## 2019-03-10 DIAGNOSIS — I10 Essential (primary) hypertension: Secondary | ICD-10-CM

## 2019-03-12 ENCOUNTER — Ambulatory Visit: Payer: Medicare Other | Attending: Nurse Practitioner | Admitting: Nurse Practitioner

## 2019-03-12 ENCOUNTER — Other Ambulatory Visit: Payer: Self-pay

## 2019-03-12 ENCOUNTER — Encounter: Payer: Self-pay | Admitting: Nurse Practitioner

## 2019-03-12 DIAGNOSIS — D509 Iron deficiency anemia, unspecified: Secondary | ICD-10-CM | POA: Diagnosis not present

## 2019-03-12 DIAGNOSIS — Z833 Family history of diabetes mellitus: Secondary | ICD-10-CM | POA: Diagnosis not present

## 2019-03-12 DIAGNOSIS — F410 Panic disorder [episodic paroxysmal anxiety] without agoraphobia: Secondary | ICD-10-CM | POA: Insufficient documentation

## 2019-03-12 DIAGNOSIS — Z87891 Personal history of nicotine dependence: Secondary | ICD-10-CM | POA: Diagnosis not present

## 2019-03-12 DIAGNOSIS — F319 Bipolar disorder, unspecified: Secondary | ICD-10-CM | POA: Diagnosis not present

## 2019-03-12 DIAGNOSIS — Q159 Congenital malformation of eye, unspecified: Secondary | ICD-10-CM

## 2019-03-12 DIAGNOSIS — F200 Paranoid schizophrenia: Secondary | ICD-10-CM | POA: Diagnosis not present

## 2019-03-12 DIAGNOSIS — R251 Tremor, unspecified: Secondary | ICD-10-CM

## 2019-03-12 NOTE — Progress Notes (Signed)
Virtual Visit via Telephone Note Due to national recommendations of social distancing due to [place] 19, telehealth visit is felt to be most appropriate for this patient at this time.  I discussed the limitations, risks, security and privacy concerns of performing an evaluation and management service by telephone and the availability of in person appointments. I also discussed with the patient that there may be a patient responsible charge related to this service. The patient expressed understanding and agreed to proceed.    I connected with Jody [name] on 03/12/19  at  11:10 AM EDT  EDT by telephone and verified that I am speaking with the correct person using two identifiers.   Consent I discussed the limitations, risks, security and privacy concerns of performing an evaluation and management service by telephone and the availability of in person appointments. I also discussed with the patient that there may be a patient responsible charge related to this service. The patient expressed understanding and agreed to proceed.   Location of Patient: Private Residence   Location of Provider: [name]    Persons participating in Telemedicine visit: Jody [name]    History of Present Illness: Telemedicine visit for: Eye complaint and tremors  has a past medical history of Anxiety and depression, Bipolar disorder ([place]), Cystitis, interstitial, Elevated troponin, Microcytic anemia, Panic attack, Paranoid schizophrenia ([place]), and Tobacco abuse.  EYE PROBLEM Onset 2 months ago. States she feels like her eyes are crossing sometimes. She had an eye exam last week with Dr. Gershon Crane but states her exam was normal.  She endorses an episode 2 months ago when she lost sight in the right eyes 30seconds. Unfortunately she did not discuss ay of her concerns with Dr Gershon Crane last week. States she called them a few days ago and is  waiting on a call back. She currently denies any RED FLAG eye symptoms at this time.    Tremor: She complains of  tremors. Tremor primarily involves the bilateral hands.  Onset of symptoms was over one year ago. Symptoms are currently of mild severity. Tremor exacerbated by nothing. Tremor is alleviated by nothing. Symptoms occur a few times per week and last seconds to minutes. She is on multiple psychiatric medications: including SSRI, mood stabilizer and benzodiazepines. She also drinks caffeine daily.  She denies rigidity, postural changes, difficulty in initiating movement, difficulty with swallowing and balance problems. Tremors are described as light. She states a Marine scientist from [employer] clinic came out to her home today. She did give me permission to speak with the nurse Karma Lew). I did call her and she states the psychiatrist did not think it was related to her medications and felt it was more psychosomatic. She performed an AIMS test and states she observed some tremors of the left hand but not bilaterally. Patient has also started consuming alcohol again. Avoidance of alcohol and caffeine were discussed today with the patient. At this time will continue to monitor.  [phone]  Past Medical History:  Diagnosis Date  . Anxiety and depression   . Bipolar disorder ([place])   . Cystitis, interstitial   . Elevated troponin    a. 09/2015: CP/elevated troponin up to 0.11 - unclear significance. CT neg for PE, LHC neg for CAD, normal echo.  . Microcytic anemia    Noted on labs  . Panic attack   . Paranoid schizophrenia ([place])   . Tobacco abuse     Past Surgical  History:  Procedure Laterality Date  . CARDIAC CATHETERIZATION N/A 09/29/2015   Procedure: Left Heart Cath and Coronary Angiography;  Surgeon: Corky CraftsJayadeep S Varanasi, MD;  [facility];  Service: Cardiovascular;  Laterality: N/A;  . TUBAL LIGATION      Family History  Problem Relation Age of Onset  .  Diabetes Father   . Hypertension Mother   . Hypertension Sister   . Lupus Sister   . Heart disease Maternal Grandfather   . Lupus Other     Social History   Socioeconomic History  . Marital status: Divorced    Spouse name: Not on file  . Number of children: 5  . Years of education: 5712  . Highest education level: Not on file  Occupational History  . Occupation: Disabled  Social Needs  . Financial resource strain: Not on file  . Food insecurity:    Worry: Not on file    Inability: Not on file  . Transportation needs:    Medical: Not on file    Non-medical: Not on file  Tobacco Use  . Smoking status: Former Smoker    Packs/day: 0.25    Years: 4.00    Pack years: 1.00    Types: Cigarettes    Last attempt to quit: 05/01/2016    Years since quitting: 2.8  . Smokeless tobacco: Never Used  Substance and Sexual Activity  . Alcohol use: Not Currently    Alcohol/week: 0.0 standard drinks    Comment: Quit 2017  . Drug use: No  . Sexual activity: Not Currently    Birth control/protection: Condom  Lifestyle  . Physical activity:    Days per week: Not on file    Minutes per session: Not on file  . Stress: Not on file  Relationships  . Social connections:    Talks on phone: Not on file    Gets together: Not on file    Attends religious service: Not on file    Active member of club or organization: Not on file    Attends meetings of clubs or organizations: Not on file    Relationship status: Not on file  Other Topics Concern  . Not on file  Social History Narrative   Lives alone   Caffeine use: Soda- daily   Right-handed     Observations/Objective: Awake, alert and oriented x 3   Review of Systems  Constitutional: Negative for fever, malaise/fatigue and weight loss.  HENT: Negative.  Negative for nosebleeds.   Eyes: Negative for blurred vision, double vision and photophobia.       SEE HPI  Respiratory: Negative.  Negative for cough and shortness of breath.    Cardiovascular: Negative.  Negative for chest pain, palpitations and leg swelling.  Gastrointestinal: Negative.  Negative for heartburn, nausea and vomiting.  Musculoskeletal: Negative.  Negative for myalgias.  Neurological: Positive for tremors. Negative for dizziness, focal weakness, seizures and headaches.  Psychiatric/Behavioral: Positive for depression, hallucinations, memory loss and substance abuse (alcohol use). Negative for suicidal ideas. The patient is nervous/anxious and has insomnia.     Assessment and Plan: Jody [name] was evaluated today for eye problem.  Diagnoses and all orders for this visit:  Occasional tremors Continue to monitor. No referrals today. Patient instructed to avoid alcohol and caffeine.   Eye abnormality Will follow up with Dr. Nile RiggsShapiro. Recent eye exam normal.     Follow Up Instructions Return in about 3 months (around 06/12/2019).     I discussed the assessment  and treatment plan with the patient. The patient was provided an opportunity to ask questions and all were answered. The patient agreed with the plan and demonstrated an understanding of the instructions.   The patient was advised to call back or seek an in-person evaluation if the symptoms worsen or if the condition fails to improve as anticipated.  I provided 21 minutes of non-face-to-face time during this encounter including median intraservice time, reviewing previous notes, labs, imaging, medications and explaining diagnosis and management.  Claiborne RiggZelda W Fleming, FNP-BC

## 2019-03-14 ENCOUNTER — Emergency Department (HOSPITAL_COMMUNITY): Payer: Medicare Other

## 2019-03-14 ENCOUNTER — Encounter (HOSPITAL_COMMUNITY): Payer: Self-pay | Admitting: Emergency Medicine

## 2019-03-14 ENCOUNTER — Other Ambulatory Visit: Payer: Self-pay

## 2019-03-14 ENCOUNTER — Emergency Department (HOSPITAL_COMMUNITY)
Admission: EM | Admit: 2019-03-14 | Discharge: 2019-03-14 | Disposition: A | Discharge status: Home / Self Care | Payer: Medicare Other | Attending: Emergency Medicine | Admitting: Emergency Medicine

## 2019-03-14 DIAGNOSIS — Z87891 Personal history of nicotine dependence: Secondary | ICD-10-CM | POA: Diagnosis not present

## 2019-03-14 DIAGNOSIS — Z79899 Other long term (current) drug therapy: Secondary | ICD-10-CM | POA: Diagnosis not present

## 2019-03-14 DIAGNOSIS — J90 Pleural effusion, not elsewhere classified: Secondary | ICD-10-CM | POA: Diagnosis not present

## 2019-03-14 DIAGNOSIS — R0789 Other chest pain: Secondary | ICD-10-CM | POA: Diagnosis not present

## 2019-03-14 LAB — CBC WITH DIFFERENTIAL/PLATELET
Abs Immature Granulocytes: 0.01 10*3/uL (ref 0.00–0.07)
Basophils Absolute: 0 10*3/uL (ref 0.0–0.1)
Basophils Relative: 0 %
Eosinophils Absolute: 0.1 10*3/uL (ref 0.0–0.5)
Eosinophils Relative: 1 %
HCT: 40.8 % (ref 36.0–46.0)
Hemoglobin: 12.5 g/dL (ref 12.0–15.0)
Immature Granulocytes: 0 %
Lymphocytes Relative: 47 %
Lymphs Abs: 2.2 10*3/uL (ref 0.7–4.0)
MCH: 23.2 pg — ABNORMAL LOW (ref 26.0–34.0)
MCHC: 30.6 g/dL (ref 30.0–36.0)
MCV: 75.7 fL — ABNORMAL LOW (ref 80.0–100.0)
Monocytes Absolute: 0.4 10*3/uL (ref 0.1–1.0)
Monocytes Relative: 8 %
Neutro Abs: 2.1 10*3/uL (ref 1.7–7.7)
Neutrophils Relative %: 44 %
Platelets: 265 10*3/uL (ref 150–400)
RBC: 5.39 MIL/uL — ABNORMAL HIGH (ref 3.87–5.11)
RDW: 15.5 % (ref 11.5–15.5)
WBC: 4.7 10*3/uL (ref 4.0–10.5)
nRBC: 0 % (ref 0.0–0.2)

## 2019-03-14 LAB — BASIC METABOLIC PANEL
Anion gap: 11 (ref 5–15)
BUN: 12 mg/dL (ref 6–20)
CO2: 22 mmol/L (ref 22–32)
Calcium: 9.4 mg/dL (ref 8.9–10.3)
Chloride: 105 mmol/L (ref 98–111)
Creatinine, Ser: 1.19 mg/dL — ABNORMAL HIGH (ref 0.44–1.00)
GFR calc Af Amer: >60 mL/min (ref >60)
GFR calc non Af Amer: 55 mL/min — ABNORMAL LOW (ref >60)
Glucose, Bld: 100 mg/dL — ABNORMAL HIGH (ref 70–99)
Potassium: 3.7 mmol/L (ref 3.5–5.1)
Sodium: 138 mmol/L (ref 135–145)

## 2019-03-14 LAB — I-STAT BETA HCG BLOOD, ED (MC, WL, AP ONLY): I-stat hCG, quantitative: <5 m[IU]/mL (ref <5)

## 2019-03-14 LAB — TROPONIN I: Troponin I: <0.03 ng/mL (ref <0.03)

## 2019-03-14 MED ORDER — ASPIRIN 81 MG PO CHEW
324.0000 mg | CHEWABLE_TABLET | Freq: Once | ORAL | Status: AC
  Administered 2019-03-14: 324 mg via ORAL
  Filled 2019-03-14: qty 4

## 2019-03-14 NOTE — ED Notes (Signed)
Pt ambulated in hallway . SpO2 maintained at 98%. Pt denies SOB

## 2019-03-14 NOTE — ED Provider Notes (Signed)
[facility] Provider Note   CSN: [id number] Arrival date & time: 03/14/19  0840    History   Chief Complaint Chief Complaint  Patient presents with  . Chest Pain    HPI Jody [name] is a 48 y.o. female.     HPI  Pt is a 48 y/o female with a h/o anxiety/depression, bipolar d/o, interstitial cystitis, anemia, paranoid schizophrenia, tobacco use (h/o, quit 2 years), who presents to the ED today for eval of chest pain. States she has had pain to the left side of her chest that has been present for months but seems to be worse for the last week. Pain feels sharp/stabbing. Pain rated 7-8/10. Pain is intermittent, lasts about 15 minutes and resolves at rest. Not associated with exertion. States intermittent SOB and intermittent pleuritic pain.  Denies associated dizziness/lightheadedness, diaphoresis, nausea, vomiting. States she has had a slight cough that is dry.   Denies leg pain/swelling, hemoptysis, recent surgery/trauma, recent long travel, hormone use, personal hx of cancer, or hx of DVT/PE.   Reviewed records per Epic. Pt had left heart cath 09/2015 which showed no significant evidence for CAD. ECHO performed during same admission which was also normal.  Denies HTN, HLD, DM. H/o tobacco use quit 2 years ago. Used to smoke 1PPD. No family h/o early CAD.   Past Medical History:  Diagnosis Date  . Anxiety and depression   . Bipolar disorder ([facility])   . Cystitis, interstitial   . Elevated troponin    a. 09/2015: CP/elevated troponin up to 0.11 - unclear significance. CT neg for PE, LHC neg for CAD, normal echo.  . Microcytic anemia    Noted on labs  . Panic attack   . Paranoid schizophrenia ([facility])   . Tobacco abuse     Patient Active Problem List   Diagnosis Date Noted  . Microcytic anemia 09/29/2015  . Depression 09/29/2015  . Tobacco abuse 09/29/2015  . Abnormal TSH 09/29/2015  . Elevated troponin   . Pleuritic chest pain  09/28/2015  . Major neurocognitive disorder ([facility]) 03/12/2015  . Hallucinations 03/12/2015  . New onset of headaches 03/12/2015  . Paranoid schizophrenia ([facility]) 03/12/2015  . 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 CARDIAC CATHETERIZATION N/A 09/29/2015   Procedure: Left Heart Cath and Coronary Angiography;  Surgeon: Corky CraftsJayadeep S Varanasi, MD;  [facility];  Service: Cardiovascular;  Laterality: N/A;  . TUBAL LIGATION       OB History   No obstetric history on file.      Home Medications    Prior to Admission medications   Medication Sig Start Date End Date Taking? Authorizing Provider  benztropine (COGENTIN) 0.5 MG tablet Take 0.5 mg by mouth at bedtime.  11/23/16  Yes [provider]  FLUoxetine (PROZAC) 20 MG capsule Take 20 mg by mouth daily.  11/23/16  Yes [provider]  hydrochlorothiazide (HYDRODIURIL) 25 MG tablet TAKE 1 TABLET BY MOUTH DAILY 03/10/19  Yes Claiborne RiggFleming, [name], NP  hydrOXYzine (VISTARIL) 25 MG capsule Take 25 mg by mouth 2 (two) times a day. 03/10/19  Yes [provider]  risperidone (RISPERDAL) 4 MG tablet Take 2 mg by mouth See admin instructions. Taking 1 tablet (4mg ) in the am and 2 tablets (8mg ) at bedtime 01/16/17  Yes [provider]  trimethoprim (TRIMPEX) 100 MG tablet Take 100 mg by mouth daily.  01/13/17  Yes [provider]  zolpidem (AMBIEN) 10 MG tablet Take 10 mg by mouth daily. 01/16/17  Yes [provider]  ibuprofen (ADVIL,MOTRIN) 600 MG tablet Take 1 tablet (600 mg total) by mouth every 8 (eight) hours as needed. Patient not taking: Reported on 03/12/2019 11/01/16   Alfonse Spruce, FNP  traZODone (DESYREL) 100 MG tablet Take 1 tablet (100 mg total) by mouth at bedtime as needed for sleep. Patient not taking: Reported on 01/30/2017 02/02/15   Niel Hummer, NP  valACYclovir (VALTREX)  1000 MG tablet Take 2 tablets (2,000 mg total) by mouth 2 (two) times daily. X 1 day prn outbreak Patient not taking: Reported on 03/14/2019 02/20/19   Argentina Donovan, PA-C  solifenacin (VESICARE) 5 MG tablet Take 5 mg by mouth daily.  01/17/13  [provider]    Family History Family History  Problem Relation Age of Onset  . Diabetes Father   . Hypertension Mother   . Hypertension Sister   . Lupus Sister   . Heart disease Maternal Grandfather   . Lupus Other     Social History Social History   Tobacco Use  . Smoking status: Former Smoker    Packs/day: 0.25    Years: 4.00    Pack years: 1.00    Types: Cigarettes    Quit date: 05/01/2016    Years since quitting: 2.8  . Smokeless tobacco: Never Used  Substance Use Topics  . Alcohol use: Not Currently    Alcohol/week: 0.0 standard drinks    Comment: Quit 2017  . Drug use: No     Allergies   Penicillins   Review of Systems Review of Systems  Constitutional: Negative for fever.  HENT: Negative for ear pain and sore throat.   Eyes: Negative for visual disturbance.  Respiratory: Positive for cough and shortness of breath.   Cardiovascular: Positive for chest pain. Negative for leg swelling.  Gastrointestinal: Negative for abdominal pain, constipation, diarrhea, nausea and vomiting.  Genitourinary: Negative for dysuria and hematuria.  Musculoskeletal: Negative for back pain.  Skin: Negative for rash.  Neurological: Negative for dizziness, weakness, light-headedness, numbness and headaches.  All other systems reviewed and are negative.    Physical Exam Updated Vital Signs BP 109/68 (BP Location: Right Arm)   Pulse 80   Temp 98.3 F (36.8 C) (Oral)   Resp 12   Ht 5\' 4"  (1.626 m)   Wt 86.2 kg   SpO2 96%   BMI 32.61 kg/m   Physical Exam Vitals signs and nursing note reviewed.  Constitutional:      General: She is not in acute distress.    Appearance: She is well-developed. She is not ill-appearing  or toxic-appearing.  HENT:     Head: Normocephalic and atraumatic.  Eyes:     Conjunctiva/sclera: Conjunctivae normal.  Neck:     Musculoskeletal: Neck supple.  Cardiovascular:     Rate and Rhythm: Normal rate and regular rhythm.     Heart sounds: Normal heart sounds. No murmur.  Pulmonary:     Effort: Pulmonary effort is normal. No respiratory distress.     Breath sounds: Normal breath sounds. No decreased breath sounds, wheezing, rhonchi or rales.  Chest:     Chest wall: No tenderness or crepitus.     Comments: No rashes to chest wall Abdominal:     Palpations: Abdomen is soft.     Tenderness: There is no abdominal tenderness.  Musculoskeletal:     Right  lower leg: She exhibits no tenderness. No edema.     Left lower leg: She exhibits no tenderness. No edema.  Skin:    General: Skin is warm and dry.  Neurological:     Mental Status: She is alert.  Psychiatric:        Mood and Affect: Affect is flat.      ED Treatments / Results  Labs (all labs ordered are listed, but only abnormal results are displayed) Labs Reviewed  CBC WITH DIFFERENTIAL/PLATELET - Abnormal; Notable for the following components:      Result Value   RBC 5.39 (*)    MCV 75.7 (*)    MCH 23.2 (*)    All other components within normal limits  BASIC METABOLIC PANEL - Abnormal; Notable for the following components:   Glucose, Bld 100 (*)    Creatinine, Ser 1.19 (*)    GFR calc non Af Amer 55 (*)    All other components within normal limits  TROPONIN I  I-STAT BETA HCG BLOOD, ED ([facility], [facility], [facility] ONLY)    EKG EKG Interpretation  Date/Time:  Friday March 14 2019 08:50:30 EDT Ventricular Rate:  91 PR Interval:    QRS Duration: 83 QT Interval:  384 QTC Calculation: 473 R Axis:   53 Text Interpretation:  Sinus rhythm nonspecific ST/T changes similar to July 2017 Confirmed by Pricilla LovelessGoldston, Scott [phone]([id number]) on 03/14/2019 8:53:23 AM   Radiology Dg Chest Portable 1 View  Result Date: 03/14/2019 CLINICAL DATA:   Chest pain for several days EXAM: PORTABLE CHEST 1 VIEW COMPARISON:  09/28/2015 FINDINGS: Cardiac shadows within normal limits. The lungs are clear bilaterally with the exception of a tiny lateral left pleural effusion. No bony abnormality is seen. IMPRESSION: Tiny left pleural effusion. Electronically Signed   By: Alcide CleverMark  Lukens M.D.   On: 03/14/2019 09:48    Procedures Procedures (including critical care time)  Medications Ordered in ED Medications  aspirin chewable tablet 324 mg (324 mg Oral Given 03/14/19 0956)     Initial Impression / Assessment and Plan / ED Course  I have reviewed the triage vital signs and the nursing notes.  Pertinent labs & imaging results that were available during my care of the patient were reviewed by me and considered in my medical decision making (see chart for details).     Final Clinical Impressions(s) / ED Diagnoses   Final diagnoses:  Atypical chest pain   48 year old female presenting to the ED for evaluation of intermittent chest pain for the last several months, seems to be worse over the last week.  Describes pain is intermittent, it occurs sporadically and is not associated with exertion.  It feels like a sharp/stabbing pain to the left side of her chest.  It is also associated with intermittent shortness of breath.  Associated with intermittent pleuritic pain.    CBC with normal WBC. No anemia.  BMP with mild bump in creatinine to 1.19, up from 0.98.  It does appear that she has had similar creatinines in the past.  Normal BUN and no gross electrolyte derangement. Pregnancy test negative Trop is negative.  EKG Sinus rhythm nonspecific ST/T changes, similar to July 2017   CXR with tiny left pleural effusion.   No risk factors for PE.  Patient is PERC negative. Heart score is 3 based on EKG, age and risk factors.  She had negative left heart cath in December 2016 as well as negative echo.  Pain sounds very atypical in nature for  ACS. With  regard to her intermittent shortness of breath, she was ambulated in the ED and maintain sats at 98% on room air without any evidence of tachypnea or reports of dyspnea.  Work up is very reassuring, I feel that she is appropriate for outpatient follow-up with her PCP.  I will also give referral to cardiology.  Advised for her to return to the ER for new or worsening symptoms.  She voices understanding of the plan and reasons to return.  All questions answered.  Patient stable for discharge.  ED Discharge Orders    None       Rayne Du 03/14/19 1618    Pricilla Loveless, MD 03/15/19 1559

## 2019-03-14 NOTE — ED Triage Notes (Signed)
Pt arrives POV with complaints of chest pain on and off X1 month. Pt states it has become worse in the last week. She wanted to come yesterday but [birth year] was her birthday.  Pain 8/10 at this time Pt endorses SOB with chest  Pain Denies N/V

## 2019-03-14 NOTE — Discharge Instructions (Signed)
Your work-up today was very reassuring.  I think that you are likely safe to follow-up as an outpatient with your primary care doctor within the next 3 to 5 days.  I also gave you a referral to follow-up with a cardiologist in the next 5 to 7 days.  Please return to the emergency department immediately if you have any chest pain that is associated with exertion, persistent chest pain, shortness of breath, swelling in your legs, or any new or worsening symptoms.

## 2019-03-17 ENCOUNTER — Telehealth: Payer: Self-pay | Admitting: Nurse Practitioner

## 2019-03-17 NOTE — Telephone Encounter (Signed)
Patient wants a refill on zalacytlovir

## 2019-03-19 ENCOUNTER — Other Ambulatory Visit: Payer: Self-pay | Admitting: Nurse Practitioner

## 2019-03-19 NOTE — Telephone Encounter (Signed)
Bien please verify how patient is taking this medication. It was supposed to have only been taken as needed and she had a supply of 30 pills sent out a few weeks ago. Also how many break outs is she experiencing? Thank you

## 2019-03-20 MED FILL — valACYclovir HCL 1 GM TABS: 1 | 30 days supply | Qty: 30 | Fill #1

## 2019-03-21 NOTE — Telephone Encounter (Signed)
CMA spoke to patient.  Patient verified DOB. Pt. Understood.  Pt. Stated she spoke to pharmacy and was able to ger her last valACYclovir refill. Pt. Concern was addressed by the pharmacy.

## 2019-04-08 ENCOUNTER — Other Ambulatory Visit: Payer: Self-pay | Admitting: Obstetrics and Gynecology

## 2019-04-08 DIAGNOSIS — N644 Mastodynia: Secondary | ICD-10-CM

## 2019-04-09 ENCOUNTER — Other Ambulatory Visit: Payer: Self-pay | Admitting: Nurse Practitioner

## 2019-04-09 DIAGNOSIS — I10 Essential (primary) hypertension: Secondary | ICD-10-CM

## 2019-04-28 ENCOUNTER — Other Ambulatory Visit: Payer: Medicare Other

## 2019-05-02 ENCOUNTER — Other Ambulatory Visit: Payer: Self-pay

## 2019-05-02 ENCOUNTER — Ambulatory Visit: Payer: Medicare Other

## 2019-05-02 ENCOUNTER — Other Ambulatory Visit: Payer: Self-pay | Admitting: Nurse Practitioner

## 2019-05-02 ENCOUNTER — Ambulatory Visit
Admission: RE | Admit: 2019-05-02 | Discharge: 2019-05-02 | Disposition: A | Discharge status: Home / Self Care | Payer: Medicare Other | Source: Ambulatory Visit | Attending: Obstetrics and Gynecology | Admitting: Obstetrics and Gynecology

## 2019-05-02 DIAGNOSIS — I10 Essential (primary) hypertension: Secondary | ICD-10-CM

## 2019-05-02 DIAGNOSIS — N644 Mastodynia: Secondary | ICD-10-CM

## 2019-05-02 DIAGNOSIS — R928 Other abnormal and inconclusive findings on diagnostic imaging of breast: Secondary | ICD-10-CM | POA: Diagnosis not present

## 2019-06-03 ENCOUNTER — Telehealth: Payer: Self-pay | Admitting: Neurology
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
## 2019-06-03 NOTE — Telephone Encounter (Addendum)
Patient has an appointment tomorrow but we completed the workup, she was returned to pcp. If she has a new symptom(s) she really should be evaluated by primary care first as this is usually in the best interest of patient to have pcp perform a general evaluation and if pcp feels  Neurology should be involved then I am more than happy to see her. Thanks

## 2019-06-04 ENCOUNTER — Ambulatory Visit: Payer: Self-pay | Admitting: Neurology

## 2019-06-09 ENCOUNTER — Other Ambulatory Visit: Payer: Self-pay | Admitting: Nurse Practitioner

## 2019-06-09 DIAGNOSIS — Z5189 Encounter for other specified aftercare: Secondary | ICD-10-CM

## 2019-06-12 ENCOUNTER — Ambulatory Visit (HOSPITAL_BASED_OUTPATIENT_CLINIC_OR_DEPARTMENT_OTHER): Payer: Medicare Other | Admitting: Physician Assistant

## 2019-06-12 ENCOUNTER — Other Ambulatory Visit: Payer: Self-pay

## 2019-06-12 ENCOUNTER — Telehealth: Payer: Self-pay | Admitting: Nurse Practitioner

## 2019-06-12 ENCOUNTER — Other Ambulatory Visit (HOSPITAL_COMMUNITY)
Admission: RE | Admit: 2019-06-12 | Discharge: 2019-06-12 | Disposition: A | Discharge status: Home / Self Care | Payer: Medicare Other | Source: Ambulatory Visit | Attending: Nurse Practitioner | Admitting: Nurse Practitioner

## 2019-06-12 VITALS — BP 125/87 | HR 84 | Temp 98.1°F | Ht 64.0 in | Wt 193.0 lb

## 2019-06-12 DIAGNOSIS — R350 Frequency of micturition: Secondary | ICD-10-CM | POA: Diagnosis not present

## 2019-06-12 DIAGNOSIS — A64 Unspecified sexually transmitted disease: Secondary | ICD-10-CM | POA: Insufficient documentation

## 2019-06-12 DIAGNOSIS — F0150 Vascular dementia without behavioral disturbance: Secondary | ICD-10-CM | POA: Diagnosis not present

## 2019-06-12 DIAGNOSIS — B009 Herpesviral infection, unspecified: Secondary | ICD-10-CM

## 2019-06-12 DIAGNOSIS — N898 Other specified noninflammatory disorders of vagina: Secondary | ICD-10-CM

## 2019-06-12 DIAGNOSIS — F0390 Unspecified dementia without behavioral disturbance: Secondary | ICD-10-CM

## 2019-06-12 MED ORDER — VALACYCLOVIR HCL 1 G PO TABS: 2000.0000 mg | ORAL_TABLET | Freq: Two times a day (BID) | ORAL | 3 refills | Status: DC

## 2019-06-12 MED FILL — valACYclovir HCL 1 GM TABS: 1 | 15 days supply | Qty: 30 | Fill #0

## 2019-06-12 NOTE — Telephone Encounter (Signed)
Patient came in to drop off dmv license form. Will be placed in pcp box

## 2019-06-12 NOTE — Progress Notes (Signed)
Patient ID: Jody [name], female   DOB: Dec 02, 1970, 48 y.o.   MRN: [id number]   Jody [name], is a 48 y.o. female  GEX:[id number]  [id number]:[id number]  DOB - [birth year]  Subjective:  Chief Complaint and HPI: Jody [name] is a 48 y.o. 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  Also c/o 1 week h/o vaginal discharge.  No pelvic pain or fever.  No urinary s/sx.  No abdominal pain.  No N/V/D.  She also asked me for a referral for Dr Arrie Senate bc she says he is helping her with the DMV about getting a driver's license.     ROS:   Constitutional:  No f/c, No night sweats, No unexplained weight loss. EENT:  No vision changes, No blurry vision, No hearing changes. No additional mouth, throat, or ear problems.  Respiratory: No cough, No SOB Cardiac: No CP, no palpitations GI:  No abd pain, No N/V/D. GU: No Urinary s/sx Musculoskeletal: No joint pain Neuro: No headache, no dizziness, no motor weakness.  Skin: No rash Endocrine:  No polydipsia. No polyuria.  Psych: Denies SI/HI  No problems updated.  ALLERGIES: Allergies  Allergen Reactions  . Penicillins Itching and Rash    PAST MEDICAL HISTORY: Past Medical History:  Diagnosis Date  . Anxiety and depression   . Bipolar disorder ([place])   . Cystitis, interstitial   . Elevated troponin    a. 09/2015: CP/elevated troponin up to 0.11 - unclear significance. CT neg for PE, LHC neg for CAD, normal echo.  . Microcytic anemia    Noted on labs  . Panic attack   . Paranoid schizophrenia ([place])   . Tobacco abuse     MEDICATIONS AT HOME: Prior to Admission medications   Medication Sig Start Date End Date Taking? Authorizing Provider  trimethoprim (TRIMPEX) 100 MG tablet Take 100 mg by mouth daily. 01/13/17  Yes [provider]  valACYclovir (VALTREX) 1000 MG tablet Take 2 tablets (2,000 mg total) by mouth 2 (two) times daily. X 1 day prn outbreak 06/12/19  Yes ,  M, PA-C  zolpidem (AMBIEN) 10 MG  tablet Take 10 mg by mouth daily. 01/16/17  Yes [provider]  benztropine (COGENTIN) 0.5 MG tablet Take 0.5 mg by mouth at bedtime.  11/23/16   [provider]  FLUoxetine (PROZAC) 20 MG capsule Take 20 mg by mouth daily.  11/23/16   [provider]  hydrochlorothiazide (HYDRODIURIL) 25 MG tablet TAKE 1 TABLET BY MOUTH DAILY 05/05/19   Gildardo Pounds, NP  hydrOXYzine (VISTARIL) 25 MG capsule Take 25 mg by mouth 2 (two) times a day. 03/10/19   [provider]  ibuprofen (ADVIL,MOTRIN) 600 MG tablet Take 1 tablet (600 mg total) by mouth every 8 (eight) hours as needed. Patient not taking: Reported on 03/12/2019 11/01/16   Alfonse Spruce, FNP  risperidone (RISPERDAL) 4 MG tablet Take 2 mg by mouth See admin instructions. Taking 1 tablet (4mg ) in the am and 2 tablets (8mg ) at bedtime 01/16/17   [provider]  traZODone (DESYREL) 100 MG tablet Take 1 tablet (100 mg total) by mouth at bedtime as needed for sleep. Patient not taking: Reported on 01/30/2017 02/02/15   Niel Hummer, NP     Objective:  EXAM:   Vitals:   06/12/19 1453  BP: 125/87  Pulse: 84  Temp: 98.1 F (36.7 C)  TempSrc: Oral  SpO2: 99%  Weight: 193 lb (87.5 kg)  Height: 5\' 4"  (  1.626 m)    General appearance : A&OX3. NAD. Non-toxic-appearing HEENT: Atraumatic and Normocephalic.  PERRLA. EOM intact.  Chest/Lungs:  Breathing-non-labored, Good air entry bilaterally, breath sounds normal without rales, rhonchi, or wheezing  CVS: S1 S2 regular, no murmurs, gallops, rubs  GU:  External genitalia WNL.  Speculum inserted-whitish yellow discharge present.  Cervix appears WNL.  Swabs taken.  Bimanual exam unremarkable. Neurology:  CN II-XII grossly intact, Non focal.   Psych:  J/I fair. slow speech. Appropriate eye contact and flat affect.  Skin:  No Rash  Data Review Lab Results  Component Value Date   HGBA1C 5.4 06/28/2016   HGBA1C 5.2 05/25/2016   HGBA1C 5.7 (H) 09/28/2015      Assessment & Plan   1. STD (female) screening in a ptient with HSV - valACYclovir (VALTREX) 1000 MG tablet; Take 2 tablets (2,000 mg total) by mouth 2 (two) times daily. X 1 day prn outbreak  Dispense: 30 tablet; Refill: 3 - HIV antibody (with reflex) - RPR  2. Urinary frequency Resolved/not having  3. Vaginal discharge -ancillary testing for STD  4. Major neurocognitive disorder [facility](HCC) - Ambulatory referral to Neurology Dr Lucia GaskinsAhern  5. HSV infection - valACYclovir (VALTREX) 1000 MG tablet; Take 2 tablets (2,000 mg total) by mouth 2 (two) times daily. X 1 day prn outbreak  Dispense: 30 tablet; Refill: 3 - HIV antibody (with reflex) - RPR  Patient have been counseled extensively about nutrition and exercise  Return in about 3 months (around 09/11/2019) for PCP.  The patient was given clear instructions to go to ER or return to medical center if symptoms don't improve, worsen or new problems develop. The patient verbalized understanding. The patient was told to call to get lab results if they haven't heard anything in the next week.     Georgian CoAngela , PA-C [facility] [place], [place] [phone]   06/12/2019, 3:06 PM

## 2019-06-13 ENCOUNTER — Other Ambulatory Visit: Payer: Self-pay | Admitting: Nurse Practitioner

## 2019-06-13 DIAGNOSIS — I10 Essential (primary) hypertension: Secondary | ICD-10-CM

## 2019-06-13 LAB — HIV ANTIBODY (ROUTINE TESTING W REFLEX): HIV Screen 4th Generation wRfx: NONREACTIVE

## 2019-06-13 LAB — RPR: RPR Ser Ql: NONREACTIVE

## 2019-06-14 LAB — CERVICOVAGINAL ANCILLARY ONLY
Bacterial vaginitis: NEGATIVE
Candida vaginitis: POSITIVE — AB
Chlamydia: NEGATIVE
Neisseria Gonorrhea: NEGATIVE
Trichomonas: NEGATIVE

## 2019-06-16 ENCOUNTER — Other Ambulatory Visit: Payer: Self-pay | Admitting: Physician Assistant

## 2019-06-16 MED ORDER — FLUCONAZOLE 150 MG PO TABS: 150.0000 mg | ORAL_TABLET | Freq: Once | ORAL | 0 refills | Status: AC

## 2019-06-16 MED FILL — FLUCONAZOLE 150 MG TABLET: 150 | 1 days supply | Qty: 1 | Fill #0

## 2019-06-16 NOTE — Telephone Encounter (Signed)
CMA will notified PCP.

## 2019-06-26 NOTE — Telephone Encounter (Signed)
Patient called to check on the status of paperwork. Please follow up.

## 2019-06-26 NOTE — Telephone Encounter (Signed)
Will route to PCP 

## 2019-06-30 NOTE — Telephone Encounter (Signed)
Follow up  Pt calling back to check on the status of her dmv paperwork that was dropped off on 06/12/2019. Please f/u

## 2019-07-01 NOTE — Telephone Encounter (Signed)
Jody [name] have you been able to contact Jody [name] regarding her [facility] referral

## 2019-07-02 NOTE — Telephone Encounter (Signed)
NOTED. Thank you

## 2019-07-02 NOTE — Telephone Encounter (Signed)
Tried multiple time to reach the Driver rehabilitation Services to see if they had tried to contact patient.  No answer and LVM.  Spoke to patient and informed that the Driver rehabilitation services was referred so they can evaluate her for driving.  Pt. Understood and stated no one has call her yet.

## 2019-07-10 DIAGNOSIS — H524 Presbyopia: Secondary | ICD-10-CM | POA: Diagnosis not present

## 2019-07-28 NOTE — Progress Notes (Deleted)
[facility]    Provider:  Dr Lucia GaskinsAhern Referring Provider: Claiborne RiggFleming, Jody W, NP Primary Care Physician:  Claiborne RiggFleming, Jody W, NP   Interval history 07/28/2019: Sent over because she says I was helping her to drive again.  I have actually spoken to patient in the past and told her that I cannot help her get a license.  What I do recommend is a formal driving school.  She was seen by me several years ago for concern of dementia, formal neurocognitive testing showed her cognitive dysfunction due to psychiatric disease and patient's cognitive status improved; however it does appear to wax and wane depending on her current status as far as her bipolar disorder is concerned.  Interval history 01/31/2017: Aricept really helped. They noticed a change a few weeks later. Her other medications were also reduced by psychiatry. She feels better. Accompanied by mother. She gets agitated and has anxiety attacks. She gets confused with reality and what is real. She lives with sister who is guardian. Family is looking for helpers for patient. She sleeps well. Walking is better. No recent falls.   Interval history 11/27/2016: Patient was seen in September for memory changes. Today she is here for gait abnormality. Past medical history depression and anxiety, Schizophrenia (managed per daughter), tobacco use, hallucinations, headache. She is here with her mom and sister who provides much information. The memory is getting worse. Her gait is worsening, they feel since Lat July but when I saw her her gait appeared normal, they say she has some good days and PT. Will start he on some Aricept. She can;t get dressed by herself in the morning. She can't put her shoes on. She can;t dress anymore without assistance. She was taking her medications wrong before but now sister has guardianship. She has a distant cousin with dementia in her 7840s. She can;t dress herself, difficulty with ADLs, need help with all IADLs. Slowly  progressed over time. Ongoing for 4 or 5 years and slowly progressive. Discussed given MRI findings and progressive memory loss concerning for early onset dementia,  Needs an FDG PET CT.   Discussed MRI brain results which did show mesial temporal atrophy:  Mildly abnormal MRI brain (with and without) demonstrating: 1. Mild scattered periventricular and subcortical foci of non-specific gliosis.  2. 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 3. No acute findings.  EEG was slowed.     JWJ:XBJYNWGNHPI:Jody L Joyneris a 48 y.o.femalehere as a referral from Dr. Elease HashimotoMcClungfor altered mental status and memory changes. Past medical history depression and anxiety, Schizophrenia (managed per daughter), tobacco use, hallucinations, headache. She is here with her daughter who provides much information. She has been :zoning out" and bedwetting and at times unresponsive. Patient has schizophrenia and she hears voices and has paranoia but she has had this for many years and she is well managed. Recently this year or longer her memory is declining, she can';t tell anyone the day, what time it is or what season it is. She can forget anything. She forgets that she has kids sometimes, forgets things are in her purse. Memories lostare both old and new. Started in July of this year, she has had 3-4 UTIs since July and she has confusion with the UTIs. She has been treated and no UTIs now. She is not responsive all the time. She stares. They find urine in the bed. There is rinigng in the ears. Motor skills are decreased, sometimes can't feed herself or wipe herself  or brush her teeth unless they prompt her to do it, she gets frustrated and it comes and goes. She has mildtremors sometimes and that is difficult but sometimes her hands just fall like she has no control over it. She will pick up her sand which and then her hands will just fall and she drops the food. It is not the  tremors, more loss of control of motor function or confusion on the steps needed. Her walking was bad in August but it has improved. She has been falling. She is confused since July. There has been stress in her life. She has been more paranoid this year but still her schizophrenia has been controlled for years and unclear why her memory would decline so substantially. She can;t cook, can't follow the steps. Symptoms progressive, nothing improves her memory, no other associated symptoms or modifying factors. Prior to this decline patient was able to make meals, drive and now she can't. No drug or alcohol use. She denies SI/intent. Unknown FHx of dementia.  Reviewed notes, labs and imaging from outside physicians, which showed:  BUN 7, creatinine 1.15 04/26/2016. TSH 6 09/28/2015.  Personally reviewed CT head and agree with the following: There is no evidence for acute hemorrhage, hydrocephalus, mass lesion, or abnormal extra-axial fluid collection. No definite CT evidence for acute infarction. Prominence the lateral ventricles is stable in the interval. The visualized paranasal sinuses and mastoid air cells are clear.  ED 7/27: Patient admitted psychotic, walking around the room naked and not aware of the situation. Per her mother patient psychotic over the last several days. Patient's mother reportedthat on 7/14 when they were in [place] visiting family the patient began to have changes in behavior. She previously reportedpatient became confused, had intermittent episodes of being nonverbal and appeared to be a per stimulated when she reported that she was having auditory hallucinations. Mother also reports that patient began having intermittent episodes of urinary incontinence, right sided weakness and difficulty ambulating. The motherreportedpatient was evaluated in the ED in [place], was diagnosed with UTI and reported having negative stroke workup including labs and CT head. She was admitted  and treated. Subsequently she displayed "normal behavior, thought process.Marland Kitchenand displays clear thinking with a plan"  Review of Systems: Patient complains of symptoms per HPI as well as the following symptoms: Weight loss, shortness of breath, easy bruising, increased thirst, ringing in ears, trouble swallowing, aching muscles, urination problems, incontinence, memory loss, confusion, headache, weakness, slurred speech, sleepiness, depression, anxiety, too much sleep, decreased energy, change in appetite, disinterest in activities, suicidal thoughts, hallucinations, racing thoughts. Pertinent negatives per HPI. All others negative.   Social History   Socioeconomic History   Marital status: Divorced    Spouse name: Not on file   Number of children: 5   Years of education: 12   Highest education level: Not on file  Occupational History   Occupation: Disabled  Ecologist strain: Not on file   Food insecurity    Worry: Not on file    Inability: Not on file   Transportation needs    Medical: Not on file    Non-medical: Not on file  Tobacco Use   Smoking status: Former Smoker    Packs/day: 0.25    Years: 4.00    Pack years: 1.00    Types: Cigarettes    Quit date: 05/01/2016    Years since quitting: 3.2   Smokeless tobacco: Never Used  Substance and Sexual Activity  Alcohol use: Not Currently    Alcohol/week: 0.0 standard drinks    Comment: Quit 2017   Drug use: No   Sexual activity: Not Currently    Birth control/protection: Condom  Lifestyle   Physical activity    Days per week: Not on file    Minutes per session: Not on file   Stress: Not on file  Relationships   Social connections    Talks on phone: Not on file    Gets together: Not on file    Attends religious service: Not on file    Active member of club or organization: Not on file    Attends meetings of clubs or organizations: Not on file    Relationship status: Not on file     Intimate partner violence    Fear of current or ex partner: Not on file    Emotionally abused: Not on file    Physically abused: Not on file    Forced sexual activity: Not on file  Other Topics Concern   Not on file  Social History Narrative   Lives alone   Caffeine use: Soda- daily   Right-handed    Family History  Problem Relation Age of Onset   Diabetes Father    Hypertension Mother    Hypertension Sister    Lupus Sister    Heart disease Maternal Grandfather    Lupus Other     Past Medical History:  Diagnosis Date   Anxiety and depression    Bipolar disorder ([place])    Cystitis, interstitial    Elevated troponin    a. 09/2015: CP/elevated troponin up to 0.11 - unclear significance. CT neg for PE, LHC neg for CAD, normal echo.   Microcytic anemia    Noted on labs   Panic attack    Paranoid schizophrenia ([place])    Tobacco abuse     Past Surgical History:  Procedure Laterality Date   CARDIAC CATHETERIZATION N/A 09/29/2015   Procedure: Left Heart Cath and Coronary Angiography;  Surgeon: Jettie Booze, MD;  Location: [facility];  Service: Cardiovascular;  Laterality: N/A;   TUBAL LIGATION      Current Outpatient Medications  Medication Sig Dispense Refill   benztropine (COGENTIN) 0.5 MG tablet Take 0.5 mg by mouth at bedtime.      FLUoxetine (PROZAC) 20 MG capsule Take 20 mg by mouth daily.      hydrochlorothiazide (HYDRODIURIL) 25 MG tablet TAKE 1 TABLET BY MOUTH DAILY 30 tablet 2   hydrOXYzine (VISTARIL) 25 MG capsule Take 25 mg by mouth 2 (two) times a day.     ibuprofen (ADVIL,MOTRIN) 600 MG tablet Take 1 tablet (600 mg total) by mouth every 8 (eight) hours as needed. (Patient not taking: Reported on 03/12/2019) 30 tablet 0   risperidone (RISPERDAL) 4 MG tablet Take 2 mg by mouth See admin instructions. Taking 1 tablet (4mg ) in the am and 2 tablets (8mg ) at bedtime     traZODone (DESYREL) 100 MG tablet Take 1 tablet (100 mg  total) by mouth at bedtime as needed for sleep. (Patient not taking: Reported on 01/30/2017) 30 tablet 0   trimethoprim (TRIMPEX) 100 MG tablet Take 100 mg by mouth daily.     valACYclovir (VALTREX) 1000 MG tablet Take 2 tablets (2,000 mg total) by mouth 2 (two) times daily. X 1 day prn outbreak 30 tablet 3   zolpidem (AMBIEN) 10 MG tablet Take 10 mg by mouth daily.     No current  facility-administered medications for this visit.     Allergies as of 07/29/2019 - Review Complete 06/12/2019  Allergen Reaction Noted   Penicillins Itching and Rash 06/09/2012    Vitals: LMP  (LMP Unknown)  Last Weight:  Wt Readings from Last 1 Encounters:  06/12/19 193 lb (87.5 kg)   Last Height:   Ht Readings from Last 1 Encounters:  06/12/19  (1.626 m)     MMSE - Mini Mental State Exam 01/31/2017 11/27/2016 06/28/2016  Orientation to time 5 0 2  Orientation to Place Registration Attention/ Calculation 2 0 0  Recall 2 0 1  Language- name 2 objects Language- repeat Language- follow 3 step command Language- read & follow direction Write a sentence 1 0 1  Copy design Total score The patient is oriented to person, place, year and month recent and remote memory impaired;  language fluent;  Impaired attention, concentration,  fund of knowledge impaired Cranial Nerves: The pupils are equal, round, and reactive to light. Attempted fundoscopic exam could not visualize due to small pupils. Visual fields are full to finger confrontation. Extraocular movements are intact. Trigeminal sensation is intact and the muscles of mastication are normal. The face is symmetric. The palate elevates in the midline. Hearing intact. Voice is normal. Shoulder shrug is normal. The tongue has normal motion without fasciculations.   Coordination: Normal finger to nose and heel to shin but difficulty understanding  instructions  Gait: Imbalance and shuffling  Motor Observation: mild postural tremor Tone: Normal muscle tone.   Posture: Posture is normal. normal erect  Strength: Strength is V/V in the upper and lower limbs.   Sensation: intact to LT  Reflex Exam:  DTR's: Deep tendon reflexes in the upper and lower extremities are normal bilaterally.  Toes: The toes are downgoing bilaterally.  Clonus: Clonus is absent.      Assessment/Plan:48 year old female with schizophrenia  progressive (over 4-5 years) impairment of memory and progressive decline. MMSE 11 today, 18 (06/2016), 15(Aug 2017), 26 (03/2015), 15(05/2016), 18(06/2016). Was living with sister who is guardian. Excessive lab testing was unremarkable. 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  Formal memory testing showed cognitive dysfunction due to schizophrenia.  -Cognitive issues fluctuate and due to her schizophrenia.  No further treatment available from neurology.  -I have spoken to patient in the past, I cannot help her get her driver's license.  What I would recommend is formal driving evaluation, and psychiatry to weigh in, but neurology cannot assess driving.  -Last time we saw her patient reported living with her mother with waxing and waning of cognitive problems based on schizophrenia status.  - FDG PET Scan: Negative for Alzheimers or Frontotemporal lobe pathology   Naomie Dean, MD  [facility] [address] [place], [place] [address]  Phone [phone] Fax [phone]  A total of 25 minutes was spent face-to-face with this patient. Over half this time was spent on counseling patient on the major neurocognitive disorder diagnosis and different diagnostic and therapeutic options available.

## 2019-07-29 ENCOUNTER — Encounter: Payer: Self-pay | Admitting: Neurology

## 2019-07-29 ENCOUNTER — Institutional Professional Consult (permissible substitution): Payer: Medicare Other | Admitting: Neurology

## 2019-07-29 ENCOUNTER — Telehealth: Payer: Self-pay | Admitting: Neurology

## 2019-07-29 NOTE — Telephone Encounter (Signed)
Note placed on chart.

## 2019-07-29 NOTE — Telephone Encounter (Signed)
I emailed the referring provider and let her know. I think we should just put a note on the chart not to schedule patient unless talking to me in the future. Can we do that bethany?

## 2019-07-29 NOTE — Telephone Encounter (Signed)
Jody [name], would you please change patient to [name]'s schedule if [name] is ok with it? This is not a new issue, she has Schizophrenia and keeps asking if I will approve her driving. I cannot, she has a new referral, I have talked to her on numerous occassions and let her know I don;t need to see her and cannot help her drive but she now has a new referral again. Please let patient know we are happy to see her but likely cannot help her with her driving as we have discussed in the past.   [name], do you mind seeing her if needed?

## 2019-07-29 NOTE — Telephone Encounter (Signed)
I am happy to see her if needed. I am concerned that I will not be able to offer her any additional help. She is no longer on memory medication and seems that referral was for driving? If we need to reevaluate memory we can but psych history complicates this. Neurocog eval negative for dementia. Is she still seeing psychiatry? Should we clarify need for referral?

## 2019-07-31 ENCOUNTER — Other Ambulatory Visit: Payer: Self-pay

## 2019-07-31 ENCOUNTER — Ambulatory Visit: Payer: Medicare Other | Attending: Nurse Practitioner | Admitting: Physician Assistant

## 2019-07-31 ENCOUNTER — Ambulatory Visit: Payer: Medicare Other

## 2019-07-31 ENCOUNTER — Encounter: Payer: Self-pay | Admitting: Physician Assistant

## 2019-07-31 DIAGNOSIS — N3000 Acute cystitis without hematuria: Secondary | ICD-10-CM | POA: Diagnosis not present

## 2019-07-31 LAB — POCT URINALYSIS DIP (CLINITEK)
Bilirubin, UA: NEGATIVE
Blood, UA: NEGATIVE
Glucose, UA: NEGATIVE mg/dL
Ketones, POC UA: NEGATIVE mg/dL
Nitrite, UA: NEGATIVE
POC PROTEIN,UA: NEGATIVE
Spec Grav, UA: 1.02 (ref 1.010–1.025)
Urobilinogen, UA: 0.2 E.U./dL
pH, UA: 7 (ref 5.0–8.0)

## 2019-07-31 MED ORDER — FLUCONAZOLE 150 MG PO TABS: 150.0000 mg | ORAL_TABLET | Freq: Once | ORAL | 0 refills | Status: AC

## 2019-07-31 MED ORDER — NITROFURANTOIN MONOHYD MACRO 100 MG PO CAPS: 100.0000 mg | ORAL_CAPSULE | Freq: Two times a day (BID) | ORAL | 0 refills | Status: DC

## 2019-07-31 MED FILL — NITROFURANTOIN MONO-MCR 100: 100 | 5 days supply | Qty: 10 | Fill #0

## 2019-07-31 MED FILL — FLUCONAZOLE 150 MG TABLET: 150 | 1 days supply | Qty: 1 | Fill #0

## 2019-07-31 NOTE — Progress Notes (Signed)
Virtual Visit via Telephone Note  I connected with Jody [name] on 07/31/19 at  2:10 PM EDT by telephone and verified that I am speaking with the correct person using two identifiers.   I discussed the limitations, risks, security and privacy concerns of performing an evaluation and management service by telephone and the availability of in person appointments. I also discussed with the patient that there may be a patient responsible charge related to this service. The patient expressed understanding and agreed to proceed.  Patient location: home My Location:  [place] office Persons on the call:  Me and the patient   History of Present Illness: Frequent urination.  Dysuria X 2 days.  Scant discharge.  No pelvic pain/abdominal pain.  No fever.     Observations/Objective:  NAD.  A&Ox3   Assessment and Plan: 1. Acute cystitis without hematuria Increase water intake - POCT URINALYSIS DIP (CLINITEK) - Urine cytology ancillary only - Urine Culture - nitrofurantoin, macrocrystal-monohydrate, (MACROBID) 100 MG capsule; Take 1 capsule (100 mg total) by mouth 2 (two) times daily.  Dispense: 10 capsule; Refill: 0 - fluconazole (DIFLUCAN) 150 MG tablet; Take 1 tablet (150 mg total) by mouth once for 1 dose.  Dispense: 1 tablet; Refill: 0    Follow Up Instructions: See PCP in 2-3 months   I discussed the assessment and treatment plan with the patient. The patient was provided an opportunity to ask questions and all were answered. The patient agreed with the plan and demonstrated an understanding of the instructions.   The patient was advised to call back or seek an in-person evaluation if the symptoms worsen or if the condition fails to improve as anticipated.  I provided 7 minutes of non-face-to-face time during this encounter.   Freeman Caldron, PA-C  Patient ID: Jody [name], female   DOB: 08-31-1971, 48 y.o.   MRN: [id number]

## 2019-08-02 LAB — URINE CULTURE

## 2019-08-11 ENCOUNTER — Telehealth: Payer: Self-pay | Admitting: *Deleted

## 2019-08-11 NOTE — Telephone Encounter (Signed)
Dr. Jaynee Eagles declined incoming referral and contacted pt's PCP in staff message. I called the pt and let her know we will cancel the December appt with Dr. Jaynee Eagles as she unfortunately will not be able to help her get her license back. Pt was encouraged to follow-up with her PCP about the next steps. She verbalized understanding and appreciation for the call.   Dr. Jaynee Eagles aware pt was notified.

## 2019-08-25 ENCOUNTER — Other Ambulatory Visit: Payer: Self-pay | Admitting: Family Medicine

## 2019-08-25 DIAGNOSIS — I10 Essential (primary) hypertension: Secondary | ICD-10-CM

## 2019-09-09 ENCOUNTER — Institutional Professional Consult (permissible substitution): Payer: Medicare Other | Admitting: Neurology

## 2019-09-22 ENCOUNTER — Ambulatory Visit: Payer: Medicare Other | Admitting: Nurse Practitioner

## 2019-09-23 ENCOUNTER — Other Ambulatory Visit: Payer: Self-pay | Admitting: Family Medicine

## 2019-09-23 DIAGNOSIS — I10 Essential (primary) hypertension: Secondary | ICD-10-CM

## 2019-10-09 DIAGNOSIS — R350 Frequency of micturition: Secondary | ICD-10-CM | POA: Diagnosis not present

## 2019-10-22 ENCOUNTER — Other Ambulatory Visit: Payer: Self-pay | Admitting: Family Medicine

## 2019-10-22 DIAGNOSIS — I10 Essential (primary) hypertension: Secondary | ICD-10-CM

## 2019-11-04 ENCOUNTER — Ambulatory Visit: Payer: Medicare Other | Admitting: Nurse Practitioner

## 2019-11-06 ENCOUNTER — Other Ambulatory Visit: Payer: Self-pay

## 2019-11-06 ENCOUNTER — Ambulatory Visit: Payer: Medicare Other | Attending: Nurse Practitioner | Admitting: Physician Assistant

## 2019-11-06 DIAGNOSIS — B009 Herpesviral infection, unspecified: Secondary | ICD-10-CM

## 2019-11-06 DIAGNOSIS — G4700 Insomnia, unspecified: Secondary | ICD-10-CM | POA: Diagnosis not present

## 2019-11-06 DIAGNOSIS — I10 Essential (primary) hypertension: Secondary | ICD-10-CM

## 2019-11-06 MED ORDER — VALACYCLOVIR HCL 1 G PO TABS: 2000.0000 mg | ORAL_TABLET | Freq: Two times a day (BID) | ORAL | 3 refills | Status: DC

## 2019-11-06 MED ORDER — TRAZODONE HCL 100 MG PO TABS: 100.0000 mg | ORAL_TABLET | Freq: Every evening | ORAL | 3 refills | Status: DC | PRN

## 2019-11-06 MED ORDER — HYDROCHLOROTHIAZIDE 25 MG PO TABS: 25.0000 mg | ORAL_TABLET | Freq: Every day | ORAL | 3 refills | Status: DC

## 2019-11-06 NOTE — Progress Notes (Signed)
Pt stated no records of BP at home/ Does not own one

## 2019-11-06 NOTE — Progress Notes (Signed)
Virtual Visit via Telephone Note  I connected with Jody [name] on 11/06/19 at 10:30 AM EST by telephone and verified that I am speaking with the correct person using two identifiers.   I discussed the limitations, risks, security and privacy concerns of performing an evaluation and management service by telephone and the availability of in person appointments. I also discussed with the patient that there may be a patient responsible charge related to this service. The patient expressed understanding and agreed to proceed.  PATIENT visit by telephone virtually in the context of Covid-19 pandemic. Patient location:  home My Location:  [facility] office Persons on the call: me and the patient   History of Present Illness:  Patient needs RF on trazadone, hctz, and valacyclovir.  She is doing well w/o complaints.  Last labs about 6 months ago and overall unremarkable.  No CP/HA/dizziness.      Observations/Objective: NAD.  A&Ox3   Assessment and Plan: 1. Essential hypertension Controlled out of office - hydrochlorothiazide (HYDRODIURIL) 25 MG tablet; Take 1 tablet (25 mg total) by mouth daily.  Dispense: 30 tablet; Refill: 3  2. HSV infection As needed - valACYclovir (VALTREX) 1000 MG tablet; Take 2 tablets (2,000 mg total) by mouth 2 (two) times daily. X 1 day prn outbreak  Dispense: 30 tablet; Refill: 3   3. Insomnia, unspecified type - traZODone (DESYREL) 100 MG tablet; Take 1 tablet (100 mg total) by mouth at bedtime as needed for sleep.  Dispense: 30 tablet; Refill: 3    Follow Up Instructions: See PCP in 3-4 months   I discussed the assessment and treatment plan with the patient. The patient was provided an opportunity to ask questions and all were answered. The patient agreed with the plan and demonstrated an understanding of the instructions.   The patient was advised to call back or seek an in-person evaluation if the symptoms worsen or if the condition fails to improve as  anticipated.  I provided 7 minutes of non-face-to-face time during this encounter.   Georgian Co, PA-C  Patient ID: Jody [name], female   DOB: 24-May-1971, 49 y.o.   MRN: [id number]

## 2019-11-25 ENCOUNTER — Ambulatory Visit: Payer: Medicare Other | Attending: Internal Medicine

## 2019-11-25 DIAGNOSIS — Z20822 Contact with and (suspected) exposure to covid-19: Secondary | ICD-10-CM

## 2019-11-26 LAB — NOVEL CORONAVIRUS, NAA: SARS-CoV-2, NAA: NOT DETECTED

## 2019-11-27 ENCOUNTER — Telehealth: Payer: Self-pay | Admitting: Nurse Practitioner

## 2019-11-27 NOTE — Telephone Encounter (Signed)
Pt is aware covid 19 test is neg on 11-27-2019

## 2020-01-01 ENCOUNTER — Ambulatory Visit: Payer: Medicare Other | Attending: Family

## 2020-01-01 DIAGNOSIS — Z23 Encounter for immunization: Secondary | ICD-10-CM

## 2020-01-01 NOTE — Progress Notes (Signed)
   Covid-19 Vaccination Clinic  Name:  Jody [name]    MRN: [id number] DOB: [birth year]  01/01/2020  [name] was observed post Covid-19 immunization for 15 minutes without incident. She was provided with Vaccine Information Sheet and instruction to access the V-Safe system.   [name] was instructed to call 911 with any severe reactions post vaccine: Marland Kitchen Difficulty breathing  . Swelling of face and throat  . A fast heartbeat  . A bad rash all over body  . Dizziness and weakness   Immunizations Administered    Name Date Dose VIS Date Route   Moderna COVID-19 Vaccine 01/01/2020 11:25 AM 0.5 mL 09/02/2019 Intramuscular   Manufacturer: Moderna   Lot: [id number]   NDC: 73419-379-02

## 2020-01-26 ENCOUNTER — Other Ambulatory Visit: Payer: Self-pay

## 2020-01-26 ENCOUNTER — Encounter: Payer: Self-pay | Admitting: Family

## 2020-01-26 ENCOUNTER — Other Ambulatory Visit (HOSPITAL_COMMUNITY)
Admission: RE | Admit: 2020-01-26 | Discharge: 2020-01-26 | Disposition: A | Discharge status: Home / Self Care | Payer: Medicare Other | Source: Ambulatory Visit | Attending: Family | Admitting: Family

## 2020-01-26 ENCOUNTER — Ambulatory Visit (HOSPITAL_BASED_OUTPATIENT_CLINIC_OR_DEPARTMENT_OTHER): Payer: Medicare Other | Admitting: Family

## 2020-01-26 VITALS — BP 126/85 | HR 92 | Temp 97.7°F | Ht 64.0 in | Wt 181.0 lb

## 2020-01-26 DIAGNOSIS — R350 Frequency of micturition: Secondary | ICD-10-CM | POA: Diagnosis not present

## 2020-01-26 DIAGNOSIS — B3731 Acute candidiasis of vulva and vagina: Secondary | ICD-10-CM

## 2020-01-26 DIAGNOSIS — N393 Stress incontinence (female) (male): Secondary | ICD-10-CM

## 2020-01-26 DIAGNOSIS — N898 Other specified noninflammatory disorders of vagina: Secondary | ICD-10-CM | POA: Diagnosis not present

## 2020-01-26 DIAGNOSIS — B373 Candidiasis of vulva and vagina: Secondary | ICD-10-CM | POA: Diagnosis not present

## 2020-01-26 LAB — POCT URINALYSIS DIP (CLINITEK)
Bilirubin, UA: NEGATIVE
Blood, UA: NEGATIVE
Glucose, UA: NEGATIVE mg/dL
Ketones, POC UA: NEGATIVE mg/dL
Leukocytes, UA: NEGATIVE
Nitrite, UA: NEGATIVE
POC PROTEIN,UA: NEGATIVE
Spec Grav, UA: 1.015 (ref 1.010–1.025)
Urobilinogen, UA: 0.2 E.U./dL
pH, UA: 6.5 (ref 5.0–8.0)

## 2020-01-26 NOTE — Progress Notes (Signed)
Patient ID: Jody [name], female    DOB: Mar 28, 1971  MRN: [id number]  CC: Frequent urination and itching  Subjective: Jody [name] is a 49 y.o. 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  1. VAGINAL ITCHING: Onset: 10 days ago Description: itching on outside of vagina feels itchy a little on the inside  Aggravating factors: denies Relieving factors:  denies  Symptoms Sexually active: yes How many partners (female/female): 1, female  Dyspareunia: sometimes Bleeding w/intercourse: denies  Birth control methods including condoms: tubes tied, condoms sometimes  LMP: 2 years ago, reports when she was diagnosed with schizophrenia and started taking medication for this her periods went away, reports a gynecologist told her that she should still be having a period but she never followed up on this matter Discharge: denies  Odor: denies Vaginal burning: denies  Dysuria: denies Pelvic pain: denies Back pain: yes, bilateral lower back  Fever: denies Genital sores/lesions: denies  Rash: denies GI Symptoms: Frequent urination (began 14 days ago) feels like she has to urinate at least every 15 minutes as if her bladder isn't quite empty, drinks mostly water with occasional soda or fruit juice. Reports she urinates on herself when she coughs or sneezes and has been doing this for many years. States that she has 5 children and that she feels that she may need surgery to stop urinating on herself when she coughs/sneezes. Requesting referral to gynecology.   Red Flags:  Missed period: denies Recent antibiotics: denies Possible STD exposure: denies IUD: denies Diabetes: denieS   Patient Active Problem List    Diagnosis Date Noted  . Microcytic anemia 09/29/2015  . Depression 09/29/2015  . Tobacco abuse 09/29/2015  . Abnormal TSH 09/29/2015  . Elevated troponin   . Pleuritic chest pain 09/28/2015  . Major neurocognitive disorder (HCC) 03/12/2015  . Hallucinations 03/12/2015  . New onset of headaches 03/12/2015  . Paranoid schizophrenia (HCC) 03/12/2015  . MDD (major depressive disorder), recurrent, severe, with psychosis (HCC) 02/01/2015     Current Outpatient Medications on File Prior to Visit  Medication Sig Dispense Refill  . benztropine (COGENTIN) 0.5 MG tablet Take 0.5 mg by mouth at bedtime.     Marland Kitchen FLUoxetine (PROZAC) 20 MG capsule Take 20 mg by mouth daily.     . hydrochlorothiazide (HYDRODIURIL) 25 MG tablet Take 1 tablet (25 mg total) by mouth daily. 30 tablet 3  . hydrOXYzine (VISTARIL) 25 MG capsule Take 25 mg by mouth 2 (two) times a day.    . ibuprofen (ADVIL,MOTRIN) 600 MG tablet Take 1 tablet (600 mg total) by mouth every 8 (eight) hours as needed. (Patient not taking: Reported on 03/12/2019) 30 tablet 0  . risperidone (RISPERDAL) 4 MG tablet Take 2 mg by mouth See admin instructions. Taking 1 tablet (4mg ) in the am and 2 tablets (8mg ) at bedtime    . traZODone (DESYREL) 100 MG tablet Take 1 tablet (100 mg total) by mouth at bedtime as needed for sleep. 30 tablet 3  . valACYclovir (VALTREX) 1000 MG tablet Take 2 tablets (2,000 mg total) by mouth 2 (two) times daily. X 1 day prn outbreak 30 tablet 3  . zolpidem (AMBIEN) 10 MG tablet Take 10 mg by mouth daily.     No current facility-administered medications on file prior  to visit.    Allergies  Allergen Reactions  . Penicillins Itching and Rash    Social History   Socioeconomic History  . Marital status: Divorced    Spouse name: Not on file  . Number of children: 5  . Years of education: 66  . Highest education level: Not on file  Occupational History  . Occupation: Disabled  Tobacco Use  . Smoking status: Former  Smoker    Packs/day: 0.25    Years: 4.00    Pack years: 1.00    Types: Cigarettes    Quit date: 05/01/2016    Years since quitting: 3.7  . Smokeless tobacco: Never Used  Substance and Sexual Activity  . Alcohol use: Not Currently    Alcohol/week: 0.0 standard drinks    Comment: Quit 2017  . Drug use: No  . Sexual activity: Not Currently    Birth control/protection: Condom  Other Topics Concern  . Not on file  Social History Narrative   Lives alone   Caffeine use: Soda- daily   Right-handed   Social Determinants of Health   Financial Resource Strain:   . Difficulty of Paying Living Expenses:   Food Insecurity:   . Worried About Programme researcher, broadcasting/film/video in the Last Year:   . Barista in the Last Year:   Transportation Needs:   . Freight forwarder (Medical):   Marland Kitchen Lack of Transportation (Non-Medical):   Physical Activity:   . Days of Exercise per Week:   . Minutes of Exercise per Session:   Stress:   . Feeling of Stress :   Social Connections:   . Frequency of Communication with Friends and Family:   . Frequency of Social Gatherings with Friends and Family:   . Attends Religious Services:   . Active Member of Clubs or Organizations:   . Attends Banker Meetings:   Marland Kitchen Marital Status:   Intimate Partner Violence:   . Fear of Current or Ex-Partner:   . Emotionally Abused:   Marland Kitchen Physically Abused:   . Sexually Abused:     Family History  Problem Relation Age of Onset  . Diabetes Father   . Hypertension Mother   . Hypertension Sister   . Lupus Sister   . Heart disease Maternal Grandfather   . Lupus Other     Past Surgical History:  Procedure Laterality Date  . CARDIAC CATHETERIZATION N/A 09/29/2015   Procedure: Left Heart Cath and Coronary Angiography;  Surgeon: Corky Crafts, MD;  Location: [facility] INVASIVE CV LAB;  Service: Cardiovascular;  Laterality: N/A;  . TUBAL LIGATION      ROS: Review of Systems Negative except as stated  above  PHYSICAL EXAM: LMP  (LMP Unknown)  Vitals with BMI 01/26/2020 06/12/2019 03/14/2019  Height 5\' 4"  5\' 4"  -  Weight 181 lbs 193 lbs -  BMI 31.05 33.11 -  Systolic 126 125  Diastolic 85 87 80  Pulse 92 84 74  Some encounter information is confidential and restricted. Go to Review Flowsheets activity to see all data.  SpO2- 99%, room air Temperature- 97.7 F, oral  Physical Exam General appearance - alert, well appearing, and in no distress and oriented to person, place, and time Mental status - alert, oriented to person, place, and time Neck - supple, no significant adenopathy Lymphatics - no palpable lymphadenopathy, no hepatosplenomegaly Chest - clear to auscultation, no wheezes, rales or rhonchi, symmetric air entry, no tachypnea, retractions or cyanosis Heart -  normal rate, regular rhythm, normal S1, S2, no murmurs, rubs, clicks or gallops Abdomen - soft, nontender, nondistended, no masses or organomegaly Pelvic - exam declined by the patient  Results for orders placed or performed in visit on 01/26/20  POCT URINALYSIS DIP (CLINITEK)  Result Value Ref Range   Color, UA light yellow (A) yellow   Clarity, UA clear clear   Glucose, UA negative negative mg/dL   Bilirubin, UA negative negative   Ketones, POC UA negative negative mg/dL   Spec Grav, UA 1.015 1.010 - 1.025   Blood, UA negative negative   pH, UA 6.5 5.0 - 8.0   POC PROTEIN,UA negative negative, trace   Urobilinogen, UA 0.2 0.2 or 1.0 E.U./dL   Nitrite, UA Negative Negative   Leukocytes, UA Negative Negative     ASSESSMENT AND PLAN: 1. Vaginal itching: -Will test for possible STIs. Will follow-up with patient once test results. - Cervicovaginal ancillary only  2. Frequent urination: -Will test for possible STIs. Will follow-up with patient once test results.  -Will test urine for possible urinary tract infection (UTI). Point-of-care urinalysis negative for nitrites and leukocytes. Patient does not have  UTI. - Cervicovaginal ancillary only - POCT URINALYSIS DIP (CLINITEK)  3. Stress incontinence, female: -Referral to gynecology per patient request for management of stress incontinence.  -Counseled patient that pelvic floor muscle (Kegel) exercises may help with stress incontinence. Also, bladder training by voiding on a timed schedule and avoidance of caffeinated beverages may help with stress incontinence. - Ambulatory referral to Gynecology  Patient was given the opportunity to ask questions.  Patient verbalized understanding of the plan and was able to repeat key elements of the plan. Patient was given clear instructions to go to Emergency Department or return to medical center if symptoms don't improve, worsen, or new problems develop.The patient verbalized understanding.  Requested Prescriptions    No prescriptions requested or ordered in this encounter    Tida Saner Zachery Dauer, NP

## 2020-01-26 NOTE — Patient Instructions (Signed)
Follow-up as needed with primary physician. Referral to Gynecology. Vaginal Yeast Infection, Adult  Vaginal yeast infection is a condition that causes vaginal discharge as well as soreness, swelling, and redness (inflammation) of the vagina. This is a common condition. Some women get this infection frequently. What are the causes? This condition is caused by a change in the normal balance of the yeast (candida) and bacteria that live in the vagina. This change causes an overgrowth of yeast, which causes the inflammation. What increases the risk? The condition is more likely to develop in women who:  Take antibiotic medicines.  Have diabetes.  Take birth control pills.  Are pregnant.  Douche often.  Have a weak body defense system (immune system).  Have been taking steroid medicines for a long time.  Frequently wear tight clothing. What are the signs or symptoms? Symptoms of this condition include:  White, thick, creamy vaginal discharge.  Swelling, itching, redness, and irritation of the vagina. The lips of the vagina (vulva) may be affected as well.  Pain or a burning feeling while urinating.  Pain during sex. How is this diagnosed? This condition is diagnosed based on:  Your medical history.  A physical exam.  A pelvic exam. Your health care provider will examine a sample of your vaginal discharge under a microscope. Your health care provider may send this sample for testing to confirm the diagnosis. How is this treated? This condition is treated with medicine. Medicines may be over-the-counter or prescription. You may be told to use one or more of the following:  Medicine that is taken by mouth (orally).  Medicine that is applied as a cream (topically).  Medicine that is inserted directly into the vagina (suppository). Follow these instructions at home:  Lifestyle  Do not have sex until your health care provider approves. Tell your sex partner that you have a  yeast infection. That person should go to his or her health care provider and ask if they should also be treated.  Do not wear tight clothes, such as pantyhose or tight pants.  Wear breathable cotton underwear. General instructions  Take or apply over-the-counter and prescription medicines only as told by your health care provider.  Eat more yogurt. This may help to keep your yeast infection from returning.  Do not use tampons until your health care provider approves.  Try taking a sitz bath to help with discomfort. This is a warm water bath that is taken while you are sitting down. The water should only come up to your hips and should cover your buttocks. Do this 3-4 times per day or as told by your health care provider.  Do not douche.  If you have diabetes, keep your blood sugar levels under control.  Keep all follow-up visits as told by your health care provider. This is important. Contact a health care provider if:  You have a fever.  Your symptoms go away and then return.  Your symptoms do not get better with treatment.  Your symptoms get worse.  You have new symptoms.  You develop blisters in or around your vagina.  You have blood coming from your vagina and it is not your menstrual period.  You develop pain in your abdomen. Summary  Vaginal yeast infection is a condition that causes discharge as well as soreness, swelling, and redness (inflammation) of the vagina.  This condition is treated with medicine. Medicines may be over-the-counter or prescription.  Take or apply over-the-counter and prescription medicines only as told  by your health care provider.  Do not douche. Do not have sex or use tampons until your health care provider approves.  Contact a health care provider if your symptoms do not get better with treatment or your symptoms go away and then return. This information is not intended to replace advice given to you by your health care provider. Make  sure you discuss any questions you have with your health care provider. Document Revised: 04/18/2019 Document Reviewed: 02/04/2018 Elsevier Patient Education  [place]. Skin Yeast Infection  A skin yeast infection is a condition in which there is an overgrowth of yeast (candida) that normally lives on the skin. This condition usually occurs in areas of the skin that are constantly warm and moist, such as the armpits or the groin. What are the causes? This condition is caused by a change in the normal balance of the yeast and bacteria that live on the skin. What increases the risk? You are more likely to develop this condition if you:  Are obese.  Are pregnant.  Take birth control pills.  Have diabetes.  Take antibiotic medicines.  Take steroid medicines.  Are malnourished.  Have a weak body defense system (immune system).  Are 70 years of age or older.  Wear tight clothing. What are the signs or symptoms? The most common symptom of this condition is itchiness in the affected area. Other symptoms include:  Red, swollen area of the skin.  Bumps on the skin. How is this diagnosed?  This condition is diagnosed with a medical history and physical exam.  Your health care provider may check for yeast by taking light scrapings of the skin to be viewed under a microscope. How is this treated? This condition is treated with medicine. Medicines may be prescribed or be available over the counter. The medicines may be:  Taken by mouth (orally).  Applied as a cream or powder to your skin. Follow these instructions at home:   Take or apply over-the-counter and prescription medicines only as told by your health care provider.  Maintain a healthy weight. If you need help losing weight, talk with your health care provider.  Keep your skin clean and dry.  If you have diabetes, keep your blood sugar under control.  Keep all follow-up visits as told by your health care  provider. This is important. Contact a health care provider if:  Your symptoms go away and then return.  Your symptoms do not get better with treatment.  Your symptoms get worse.  Your rash spreads.  You have a fever or chills.  You have new symptoms.  You have new warmth or redness of your skin. Summary  A skin yeast infection is a condition in which there is an overgrowth of yeast (candida) that normally lives on the skin. This condition is caused by a change in the normal balance of the yeast and bacteria that live on the skin.  Take or apply over-the-counter and prescription medicines only as told by your health care provider.  Keep your skin clean and dry.  Contact a health care provider if your symptoms do not get better with treatment. This information is not intended to replace advice given to you by your health care provider. Make sure you discuss any questions you have with your health care provider. Document Revised: 02/05/2018 Document Reviewed: 02/05/2018 Elsevier Patient Education  [place].

## 2020-01-27 ENCOUNTER — Telehealth: Payer: Self-pay | Admitting: Nurse Practitioner

## 2020-01-27 LAB — CERVICOVAGINAL ANCILLARY ONLY
Bacterial Vaginitis (gardnerella): NEGATIVE
Candida Glabrata: NEGATIVE
Candida Vaginitis: POSITIVE — AB
Chlamydia: NEGATIVE
Comment: NEGATIVE
Comment: NEGATIVE
Comment: NEGATIVE
Comment: NEGATIVE
Comment: NEGATIVE
Comment: NORMAL
Neisseria Gonorrhea: NEGATIVE
Trichomonas: NEGATIVE

## 2020-01-27 LAB — URINALYSIS, ROUTINE W REFLEX MICROSCOPIC
Bilirubin, UA: NEGATIVE
Glucose, UA: NEGATIVE
Ketones, UA: NEGATIVE
Leukocytes,UA: NEGATIVE
Nitrite, UA: NEGATIVE
Protein,UA: NEGATIVE
RBC, UA: NEGATIVE
Specific Gravity, UA: 1.007 (ref 1.005–1.030)
Urobilinogen, Ur: 0.2 mg/dL (ref 0.2–1.0)
pH, UA: 6.5 (ref 5.0–7.5)

## 2020-01-27 MED ORDER — FLUCONAZOLE 150 MG PO TABS: 150.0000 mg | ORAL_TABLET | Freq: Once | ORAL | 0 refills | Status: AC

## 2020-01-27 MED FILL — FLUCONAZOLE 150 MG TABLET: 150 | 1 days supply | Qty: 1 | Fill #0

## 2020-01-27 NOTE — Progress Notes (Signed)
Please call patient with update.   Patient has yeast infection. Fluconazole 1 tablet (150 mg total) for a one time dose has been prescribed for this and sent to the clinic pharmacy.   Follow-up with PCP as needed or if symptoms do not resolve or worsen.

## 2020-01-27 NOTE — Telephone Encounter (Signed)
Patient called in and requested for her gynecology referral to be sent to [address] [place],  [place]  [address]. Patient requested to be with Levi Aland. Fax number :[phone] Phone number: [phone]

## 2020-01-27 NOTE — Progress Notes (Signed)
Urinalysis normal meaning patient does not have urinary tract infection.

## 2020-01-27 NOTE — Addendum Note (Signed)
Addended by: Rema Fendt on: 01/27/2020 03:21 PM   Modules accepted: Orders

## 2020-01-29 NOTE — Telephone Encounter (Signed)
Noted   Sent referral to [facility] health care thru [employer] system

## 2020-02-03 ENCOUNTER — Ambulatory Visit: Payer: Medicare Other | Attending: Family

## 2020-02-03 DIAGNOSIS — Z23 Encounter for immunization: Secondary | ICD-10-CM

## 2020-02-03 NOTE — Progress Notes (Signed)
   Covid-19 Vaccination Clinic  Name:  Jody [name]    MRN: [id number] DOB: 1971/01/10  02/03/2020  [name] was observed post Covid-19 immunization for 15 minutes without incident. She was provided with Vaccine Information Sheet and instruction to access the V-Safe system.   [name] was instructed to call 911 with any severe reactions post vaccine: Marland Kitchen Difficulty breathing  . Swelling of face and throat  . A fast heartbeat  . A bad rash all over body  . Dizziness and weakness   Immunizations Administered    Name Date Dose VIS Date Route   Moderna COVID-19 Vaccine 02/03/2020 10:44 AM 0.5 mL 09/2019 Intramuscular   Manufacturer: Moderna   Lot: [id number]   NDC: 86854-883-01

## 2020-03-04 ENCOUNTER — Other Ambulatory Visit: Payer: Self-pay | Admitting: Physician Assistant

## 2020-03-04 ENCOUNTER — Telehealth: Payer: Self-pay | Admitting: Nurse Practitioner

## 2020-03-04 DIAGNOSIS — I10 Essential (primary) hypertension: Secondary | ICD-10-CM

## 2020-03-04 NOTE — Telephone Encounter (Signed)
Patient called saying she called [facility] and was told that they have not received the referral. Marc Morgans and asked they if they received the referral that was sent on 03/03/20 and the rep stated that they have not received it. Please f/u

## 2020-03-04 NOTE — Telephone Encounter (Signed)
The referral was sent  yesterday thru [employer] and they answer  Rejection Reason - Unapproved Service - We do not accept Medicaid for new patient referrals" [facility] . I spoke to patient and she is aware  thank you.

## 2020-06-08 ENCOUNTER — Other Ambulatory Visit: Payer: Self-pay | Admitting: Nurse Practitioner

## 2020-06-08 DIAGNOSIS — I10 Essential (primary) hypertension: Secondary | ICD-10-CM

## 2020-06-30 ENCOUNTER — Other Ambulatory Visit: Payer: Self-pay | Admitting: Nurse Practitioner

## 2020-06-30 DIAGNOSIS — I10 Essential (primary) hypertension: Secondary | ICD-10-CM

## 2020-09-03 DIAGNOSIS — H25813 Combined forms of age-related cataract, bilateral: Secondary | ICD-10-CM | POA: Diagnosis not present

## 2020-09-15 DIAGNOSIS — N3010 Interstitial cystitis (chronic) without hematuria: Secondary | ICD-10-CM | POA: Diagnosis not present

## 2020-09-15 DIAGNOSIS — N3946 Mixed incontinence: Secondary | ICD-10-CM | POA: Diagnosis not present

## 2020-09-15 DIAGNOSIS — R300 Dysuria: Secondary | ICD-10-CM | POA: Diagnosis not present

## 2020-09-22 DIAGNOSIS — H2511 Age-related nuclear cataract, right eye: Secondary | ICD-10-CM | POA: Diagnosis not present

## 2020-09-22 DIAGNOSIS — H25812 Combined forms of age-related cataract, left eye: Secondary | ICD-10-CM | POA: Diagnosis not present

## 2020-09-22 DIAGNOSIS — H25011 Cortical age-related cataract, right eye: Secondary | ICD-10-CM | POA: Diagnosis not present

## 2020-09-28 ENCOUNTER — Telehealth: Payer: Self-pay | Admitting: Nurse Practitioner

## 2020-09-28 ENCOUNTER — Other Ambulatory Visit: Payer: Self-pay | Admitting: Nurse Practitioner

## 2020-09-28 DIAGNOSIS — I10 Essential (primary) hypertension: Secondary | ICD-10-CM

## 2020-09-28 NOTE — Telephone Encounter (Signed)
Medication Refill - Medication: HCTZ  Has the patient contacted their pharmacy? Yes.   (Agent: If no, request that the patient contact the pharmacy for the refill.) (Agent: If yes, when and what did the pharmacy advise?)  Preferred Pharmacy (with phone number or street name): [id number] - [place], [place] - [address]  Agent: Please be advised that RX refills may take up to 3 business days. We ask that you follow-up with your pharmacy.

## 2020-09-28 NOTE — Telephone Encounter (Signed)
Requested medication sent to pharmacy on 09/28/20.

## 2020-09-29 DIAGNOSIS — H2512 Age-related nuclear cataract, left eye: Secondary | ICD-10-CM | POA: Diagnosis not present

## 2020-09-29 DIAGNOSIS — H25012 Cortical age-related cataract, left eye: Secondary | ICD-10-CM | POA: Diagnosis not present

## 2020-09-30 ENCOUNTER — Other Ambulatory Visit: Payer: Self-pay | Admitting: Nurse Practitioner

## 2020-09-30 DIAGNOSIS — I10 Essential (primary) hypertension: Secondary | ICD-10-CM

## 2020-09-30 NOTE — Telephone Encounter (Signed)
Copied from CRM [id number]. Topic: General - Other >> Sep 30, 2020 10:44 AM Jaquita Rector A wrote: Reason for CRM: Patient called in to inquire of Bertram Denver why she only received 10 tabs for her medication hydrochlorothiazide (HYDRODIURIL) 25 MG tablet that she requested a refill on. Patient made an appointment for 11/15/20 and ask for a call back when more medication have been sent to the pharmacy to hold her until her appointment time. Can be reached at Ph# [phone]

## 2020-10-07 NOTE — Telephone Encounter (Signed)
Spoke to patient. Patient is aware of Rx.

## 2020-10-14 DIAGNOSIS — Z23 Encounter for immunization: Secondary | ICD-10-CM | POA: Diagnosis not present

## 2020-11-08 DIAGNOSIS — F251 Schizoaffective disorder, depressive type: Secondary | ICD-10-CM | POA: Diagnosis not present

## 2020-11-15 ENCOUNTER — Ambulatory Visit: Payer: Medicare Other | Attending: Nurse Practitioner | Admitting: Nurse Practitioner

## 2020-11-15 ENCOUNTER — Other Ambulatory Visit: Payer: Self-pay

## 2020-11-15 ENCOUNTER — Encounter: Payer: Self-pay | Admitting: Nurse Practitioner

## 2020-11-15 VITALS — BP 120/86 | HR 73 | Temp 98.6°F | Ht 64.0 in | Wt 198.0 lb

## 2020-11-15 DIAGNOSIS — F32A Depression, unspecified: Secondary | ICD-10-CM | POA: Diagnosis not present

## 2020-11-15 DIAGNOSIS — R7303 Prediabetes: Secondary | ICD-10-CM | POA: Insufficient documentation

## 2020-11-15 DIAGNOSIS — G4700 Insomnia, unspecified: Secondary | ICD-10-CM | POA: Insufficient documentation

## 2020-11-15 DIAGNOSIS — Z1231 Encounter for screening mammogram for malignant neoplasm of breast: Secondary | ICD-10-CM | POA: Insufficient documentation

## 2020-11-15 DIAGNOSIS — F410 Panic disorder [episodic paroxysmal anxiety] without agoraphobia: Secondary | ICD-10-CM | POA: Diagnosis not present

## 2020-11-15 DIAGNOSIS — F419 Anxiety disorder, unspecified: Secondary | ICD-10-CM | POA: Diagnosis not present

## 2020-11-15 DIAGNOSIS — I10 Essential (primary) hypertension: Secondary | ICD-10-CM | POA: Insufficient documentation

## 2020-11-15 DIAGNOSIS — Z1159 Encounter for screening for other viral diseases: Secondary | ICD-10-CM | POA: Diagnosis not present

## 2020-11-15 DIAGNOSIS — D509 Iron deficiency anemia, unspecified: Secondary | ICD-10-CM | POA: Diagnosis not present

## 2020-11-15 DIAGNOSIS — Z1211 Encounter for screening for malignant neoplasm of colon: Secondary | ICD-10-CM | POA: Diagnosis not present

## 2020-11-15 DIAGNOSIS — Z760 Encounter for issue of repeat prescription: Secondary | ICD-10-CM | POA: Insufficient documentation

## 2020-11-15 DIAGNOSIS — F200 Paranoid schizophrenia: Secondary | ICD-10-CM | POA: Diagnosis not present

## 2020-11-15 DIAGNOSIS — Z79899 Other long term (current) drug therapy: Secondary | ICD-10-CM | POA: Diagnosis not present

## 2020-11-15 DIAGNOSIS — E785 Hyperlipidemia, unspecified: Secondary | ICD-10-CM | POA: Diagnosis not present

## 2020-11-15 LAB — POCT GLYCOSYLATED HEMOGLOBIN (HGB A1C): Hemoglobin A1C: 5.5 % (ref 4.0–5.6)

## 2020-11-15 LAB — GLUCOSE, POCT (MANUAL RESULT ENTRY): POC Glucose: 111 mg/dl — AB (ref 70–99)

## 2020-11-15 MED ORDER — HYDROXYZINE PAMOATE 25 MG PO CAPS: 25.0000 mg | ORAL_CAPSULE | Freq: Two times a day (BID) | ORAL | 3 refills | Status: AC

## 2020-11-15 MED ORDER — HYDROCHLOROTHIAZIDE 25 MG PO TABS
25.0000 mg | ORAL_TABLET | Freq: Every day | ORAL | 1 refills | Status: DC
Start: 2020-11-15 — End: 2021-05-27

## 2020-11-15 MED ORDER — TRAZODONE HCL 100 MG PO TABS: 100.0000 mg | ORAL_TABLET | Freq: Every evening | ORAL | 1 refills | Status: DC | PRN

## 2020-11-15 NOTE — Progress Notes (Signed)
Assessment & Plan:  Jody [name] was seen today for medication refill.  Diagnoses and all orders for this visit:  Essential hypertension -     hydrochlorothiazide (HYDRODIURIL) 25 MG tablet; Take 1 tablet (25 mg total) by mouth daily. -     CMP14+EGFR Continue all antihypertensives as prescribed.  Remember to bring in your blood pressure log with you for your follow up appointment.  DASH/Mediterranean Diets are healthier choices for HTN.    Prediabetes -     Glucose (CBG) -     HgB A1c  Insomnia, unspecified type -     traZODone (DESYREL) 100 MG tablet; Take 1 tablet (100 mg total) by mouth at bedtime as needed for sleep.  Need for hepatitis C screening test -     HCV Ab w Reflex to Quant PCR  Breast cancer screening by mammogram -     MM 3D SCREEN BREAST BILATERAL; Future  Colon cancer screening -     Ambulatory referral to Gastroenterology  Microcytic anemia -     CBC  Dyslipidemia, goal LDL below 100 -     Lipid panel  Anxiety and depression -     hydrOXYzine (VISTARIL) 25 MG capsule; Take 1 capsule (25 mg total) by mouth in the morning and at bedtime.    Patient has been counseled on age-appropriate routine health concerns for screening and prevention. These are reviewed and up-to-date. Referrals have been placed accordingly. Immunizations are up-to-date or declined.    Subjective:   Chief Complaint  Patient presents with  . Medication Refill    Patient Is here for medication refill.    HPI DANDREA MEDDERS 50 y.o. female presents to office today for follow up and medication refills.  She has a past medical history of Anxiety and depression, Bipolar disorder , Cystitis, interstitial, Elevated troponin, Microcytic anemia, Panic attack, Paranoid schizophrenia ([place]), and Tobacco abuse.    Essential Hypertension Well controlled with HCTZ 25 mg daily as prescribed. Denies chest pain, shortness of breath, palpitations, lightheadedness, dizziness, headaches or BLE  edema.  BP Readings from Last 3 Encounters:  11/15/20 120/86  01/26/20 126/85  06/12/19 125/87    Prediabetes Well controlled without the use of oral diabetic agents.  Lab Results  Component Value Date   HGBA1C 5.5 11/15/2020   Lab Results  Component Value Date   LDLCALC 151 (H) 11/15/2020   She endorses anxiety, depression and insomnia. Doing well with trazodone and hydroxyzine. Will continue at this time.  GAD 7 : Generalized Anxiety Score 11/06/2019 06/12/2019 03/12/2019 06/10/2018  Nervous, Anxious, on Edge 0 '3 1 3  ' Control/stop worrying 0 3 - 3  Worry too much - different things '1 3 3 3  ' Trouble relaxing 0 '3 3 3  ' Restless 0 '1 1 3  ' Easily annoyed or irritable 0 '3 1 2  ' Afraid - awful might happen '1 1 1 3  ' Total GAD 7 Score 2 17 - 20  Anxiety Difficulty - Extremely difficult - -    Review of Systems  Constitutional: Negative for fever, malaise/fatigue and weight loss.  HENT: Negative.  Negative for nosebleeds.   Eyes: Negative.  Negative for blurred vision, double vision and photophobia.  Respiratory: Negative.  Negative for cough and shortness of breath.   Cardiovascular: Negative.  Negative for chest pain, palpitations and leg swelling.  Gastrointestinal: Negative.  Negative for heartburn, nausea and vomiting.  Musculoskeletal: Negative.  Negative for myalgias.  Neurological: Negative.  Negative for dizziness, focal weakness,  seizures and headaches.  Psychiatric/Behavioral: Positive for depression. Negative for suicidal ideas. The patient is nervous/anxious and has insomnia.     Past Medical History:  Diagnosis Date  . Anxiety and depression   . Bipolar disorder ([place])   . Cystitis, interstitial   . Elevated troponin    a. 09/2015: CP/elevated troponin up to 0.11 - unclear significance. CT neg for PE, LHC neg for CAD, normal echo.  . Microcytic anemia    Noted on labs  . Panic attack   . Paranoid schizophrenia ([place])   . Tobacco abuse     Past Surgical History:   Procedure Laterality Date  . CARDIAC CATHETERIZATION N/A 09/29/2015   Procedure: Left Heart Cath and Coronary Angiography;  Surgeon: Jettie Booze, MD;  Location: [facility];  Service: Cardiovascular;  Laterality: N/A;  . TUBAL LIGATION      Family History  Problem Relation Age of Onset  . Diabetes Father   . Hypertension Mother   . Hypertension Sister   . Lupus Sister   . Heart disease Maternal Grandfather   . Lupus Other     Social History Reviewed with no changes to be made today.   Outpatient Medications Prior to Visit  Medication Sig Dispense Refill  . benztropine (COGENTIN) 0.5 MG tablet Take 0.5 mg by mouth at bedtime.     . risperidone (RISPERDAL) 4 MG tablet Take 2 mg by mouth See admin instructions. Taking 1 tablet (25m) in the am and 2 tablets (837m at bedtime    . hydrochlorothiazide (HYDRODIURIL) 25 MG tablet Take 1 tablet (25 mg total) by mouth daily. APPOINTMENT NEEDED FOR ADDITIONAL REFILLS 60 tablet 0  . hydrOXYzine (VISTARIL) 25 MG capsule Take 25 mg by mouth 2 (two) times a day.    . traZODone (DESYREL) 100 MG tablet Take 1 tablet (100 mg total) by mouth at bedtime as needed for sleep. 30 tablet 3  . zolpidem (AMBIEN) 10 MG tablet Take 10 mg by mouth daily.    . Marland KitchenLUoxetine (PROZAC) 20 MG capsule Take 20 mg by mouth daily.  (Patient not taking: Reported on 11/15/2020)    . ibuprofen (ADVIL,MOTRIN) 600 MG tablet Take 1 tablet (600 mg total) by mouth every 8 (eight) hours as needed. (Patient not taking: No sig reported) 30 tablet 0  . valACYclovir (VALTREX) 1000 MG tablet Take 2 tablets (2,000 mg total) by mouth 2 (two) times daily. X 1 day prn outbreak (Patient not taking: No sig reported) 30 tablet 3   No facility-administered medications prior to visit.    Allergies  Allergen Reactions  . Penicillins Itching and Rash       Objective:    BP 120/86 (BP Location: Right Arm, Patient Position: Sitting, Cuff Size: Large)   Pulse 73   Temp 98.6 F  (37 C) (Oral)   Ht '5\' 4"'  (1.626 m)   Wt 198 lb (89.8 kg)   LMP  (LMP Unknown)   SpO2 96%   BMI 33.99 kg/m  Wt Readings from Last 3 Encounters:  11/15/20 198 lb (89.8 kg)  01/26/20 181 lb (82.1 kg)  06/12/19 193 lb (87.5 kg)    Physical Exam Vitals and nursing note reviewed.  Constitutional:      Appearance: She is well-developed and well-nourished.  HENT:     Head: Normocephalic and atraumatic.  Eyes:     Extraocular Movements: EOM normal.  Cardiovascular:     Rate and Rhythm: Normal rate and regular rhythm.  Pulses: Intact distal pulses.     Heart sounds: Normal heart sounds. No murmur heard. No friction rub. No gallop.   Pulmonary:     Effort: Pulmonary effort is normal. No tachypnea or respiratory distress.     Breath sounds: Normal breath sounds. No decreased breath sounds, wheezing, rhonchi or rales.  Chest:     Chest wall: No tenderness.  Abdominal:     General: Bowel sounds are normal.     Palpations: Abdomen is soft.  Musculoskeletal:        General: No edema. Normal range of motion.     Cervical back: Normal range of motion.  Skin:    General: Skin is warm and dry.  Neurological:     Mental Status: She is alert and oriented to person, place, and time.     Coordination: Coordination normal.  Psychiatric:        Attention and Perception: Attention and perception normal.        Mood and Affect: Mood and affect and affect normal.        Speech: Speech normal.        Behavior: Behavior normal. Behavior is cooperative.        Thought Content: Thought content normal. Thought content does not include homicidal or suicidal ideation. Thought content does not include homicidal or suicidal plan.        Judgment: Judgment normal.          Patient has been counseled extensively about nutrition and exercise as well as the importance of adherence with medications and regular follow-up. The patient was given clear instructions to go to ER or return to medical center  if symptoms don't improve, worsen or new problems develop. The patient verbalized understanding.   Follow-up: Return in about 3 months (around 02/12/2021).   Gildardo Pounds, FNP-BC [facility] and [facility] [place], [place]   11/21/2020, 10:16 PM

## 2020-11-16 LAB — LIPID PANEL
Chol/HDL Ratio: 4.5 ratio — ABNORMAL HIGH (ref 0.0–4.4)
Cholesterol, Total: 224 mg/dL — ABNORMAL HIGH (ref 100–199)
HDL: 50 mg/dL (ref >39)
LDL Chol Calc (NIH): 151 mg/dL — ABNORMAL HIGH (ref 0–99)
Triglycerides: 126 mg/dL (ref 0–149)
VLDL Cholesterol Cal: 23 mg/dL (ref 5–40)

## 2020-11-16 LAB — CBC
Hematocrit: 40.7 % (ref 34.0–46.6)
Hemoglobin: 12.8 g/dL (ref 11.1–15.9)
MCH: 23.3 pg — ABNORMAL LOW (ref 26.6–33.0)
MCHC: 31.4 g/dL — ABNORMAL LOW (ref 31.5–35.7)
MCV: 74 fL — ABNORMAL LOW (ref 79–97)
Platelets: 275 10*3/uL (ref 150–450)
RBC: 5.5 x10E6/uL — ABNORMAL HIGH (ref 3.77–5.28)
RDW: 14.8 % (ref 11.7–15.4)
WBC: 5.3 10*3/uL (ref 3.4–10.8)

## 2020-11-16 LAB — CMP14+EGFR
ALT: 23 IU/L (ref 0–32)
AST: 20 IU/L (ref 0–40)
Albumin/Globulin Ratio: 1.8 (ref 1.2–2.2)
Albumin: 4.8 g/dL (ref 3.8–4.8)
Alkaline Phosphatase: 83 IU/L (ref 44–121)
BUN/Creatinine Ratio: 12 (ref 9–23)
BUN: 13 mg/dL (ref 6–24)
Bilirubin Total: 0.2 mg/dL (ref 0.0–1.2)
CO2: 24 mmol/L (ref 20–29)
Calcium: 9.7 mg/dL (ref 8.7–10.2)
Chloride: 104 mmol/L (ref 96–106)
Creatinine, Ser: 1.1 mg/dL — ABNORMAL HIGH (ref 0.57–1.00)
GFR calc Af Amer: 69 mL/min/{1.73_m2} (ref >59)
GFR calc non Af Amer: 60 mL/min/{1.73_m2} (ref >59)
Globulin, Total: 2.6 g/dL (ref 1.5–4.5)
Glucose: 97 mg/dL (ref 65–99)
Potassium: 4.7 mmol/L (ref 3.5–5.2)
Sodium: 142 mmol/L (ref 134–144)
Total Protein: 7.4 g/dL (ref 6.0–8.5)

## 2020-11-16 LAB — HCV INTERPRETATION

## 2020-11-16 LAB — HCV AB W REFLEX TO QUANT PCR: HCV Ab: <0.1 s/co ratio (ref 0.0–0.9)

## 2020-11-21 ENCOUNTER — Encounter: Payer: Self-pay | Admitting: Nurse Practitioner

## 2020-11-21 MED ORDER — FERROUS GLUCONATE 324 (38 FE) MG PO TABS: 324.0000 mg | ORAL_TABLET | Freq: Every day | ORAL | 3 refills | Status: DC

## 2020-11-25 ENCOUNTER — Encounter: Payer: Self-pay | Admitting: Gastroenterology

## 2021-01-03 ENCOUNTER — Telehealth: Payer: Self-pay | Admitting: *Deleted

## 2021-01-03 NOTE — Telephone Encounter (Signed)
Patient no show PV appointment for today. I called patient, no answer, left a message for the patient to call us back today. 

## 2021-01-03 NOTE — Telephone Encounter (Signed)
Patient no show PV appointment for today. I called patient, no answer, left a message for the patient to call us back today before 5 pm or the PV and procedure will be cancelled  

## 2021-01-03 NOTE — Telephone Encounter (Signed)
No show letter mailed to pt today. Colon and PV cancelled.

## 2021-01-25 ENCOUNTER — Encounter: Payer: Medicare Other | Admitting: Gastroenterology

## 2021-02-03 DIAGNOSIS — F251 Schizoaffective disorder, depressive type: Secondary | ICD-10-CM | POA: Diagnosis not present

## 2021-02-16 ENCOUNTER — Ambulatory Visit: Payer: Medicare Other | Admitting: Nurse Practitioner

## 2021-04-14 DIAGNOSIS — Z23 Encounter for immunization: Secondary | ICD-10-CM | POA: Diagnosis not present

## 2021-04-28 ENCOUNTER — Other Ambulatory Visit: Payer: Self-pay | Admitting: Nurse Practitioner

## 2021-04-28 DIAGNOSIS — F419 Anxiety disorder, unspecified: Secondary | ICD-10-CM

## 2021-04-28 DIAGNOSIS — F32A Depression, unspecified: Secondary | ICD-10-CM

## 2021-04-28 NOTE — Telephone Encounter (Signed)
   Notes to clinic:  Patient no showed appt on 02/16/2021 Review for refill    Requested Prescriptions  Pending Prescriptions Disp Refills   hydrOXYzine (VISTARIL) 25 MG capsule [Pharmacy Med Name: hydrOXYzine Pamoate 25MG  CAPS*] 60 capsule 3    Sig: TAKE 1 CAPSULE BY MOUTH IN THE MORNING AND TAKE 1 CAPSULE BY MOUTH AT BEDTIME      Ear, Nose, and Throat:  Antihistamines Passed - 04/28/2021  1:20 PM      Passed - Valid encounter within last 12 months    Recent Outpatient Visits           5 months ago Essential hypertension   [facility] [place], [place], NP   1 year ago Vaginal itching   [facility] [place], [place], NP   1 year ago Insomnia, unspecified type   [facility] [place], [place], [place]   1 year ago Acute cystitis without hematuria   [facility] [place], [place], Marzella Schlein   1 year ago STD (female)   [facility] [place], [place], [place]

## 2021-05-11 DIAGNOSIS — F251 Schizoaffective disorder, depressive type: Secondary | ICD-10-CM | POA: Diagnosis not present

## 2021-05-25 ENCOUNTER — Ambulatory Visit: Payer: Self-pay

## 2021-05-25 NOTE — Telephone Encounter (Signed)
Patient called states she has burning with urination for a few days now. Please call back  Pt. Reports she has had burning and pain with urination x 2 days. Getting worse. Has some frequency. Declines to go to Mobile unit. Would rather be seen in the office. Practice closed for lunch currently. Please advise pt.   Answer Assessment - Initial Assessment Questions 1. SEVERITY: "How bad is the pain?"  (e.g., Scale 1-10; mild, moderate, or severe)   - MILD (1-3): complains slightly about urination hurting   - MODERATE (4-7): interferes with normal activities     - SEVERE (8-10): excruciating, unwilling or unable to urinate because of the pain      Moderate 2. FREQUENCY: "How many times have you had painful urination today?"      5 3. PATTERN: "Is pain present every time you urinate or just sometimes?"      Every time 4. ONSET: "When did the painful urination start?"      2 days ago 5. FEVER: "Do you have a fever?" If Yes, ask: "What is your temperature, how was it measured, and when did it start?"     No 6. PAST UTI: "Have you had a urine infection before?" If Yes, ask: "When was the last time?" and "What happened that time?"      Yes 7. CAUSE: "What do you think is causing the painful urination?"  (e.g., UTI, scratch, Herpes sore)     UTI 8. OTHER SYMPTOMS: "Do you have any other symptoms?" (e.g., flank pain, vaginal discharge, genital sores, urgency, blood in urine)     Burning 9. PREGNANCY: "Is there any chance you are pregnant?" "When was your last menstrual period?"     No  Protocols used: Urination Pain - Female-A-AH

## 2021-05-26 NOTE — Telephone Encounter (Signed)
Pt was called and a VM was left informing pt to return phone call.  Pt can come in for a nurse visit to drop off urine sample only.

## 2021-05-27 ENCOUNTER — Ambulatory Visit (HOSPITAL_BASED_OUTPATIENT_CLINIC_OR_DEPARTMENT_OTHER): Payer: Medicare Other | Admitting: Nurse Practitioner

## 2021-05-27 ENCOUNTER — Other Ambulatory Visit: Payer: Self-pay

## 2021-05-27 ENCOUNTER — Other Ambulatory Visit (HOSPITAL_COMMUNITY)
Admission: RE | Admit: 2021-05-27 | Discharge: 2021-05-27 | Disposition: A | Discharge status: Home / Self Care | Payer: Medicare Other | Source: Ambulatory Visit | Attending: Nurse Practitioner | Admitting: Nurse Practitioner

## 2021-05-27 VITALS — BP 113/77 | HR 81 | Ht 64.0 in

## 2021-05-27 DIAGNOSIS — N3081 Other cystitis with hematuria: Secondary | ICD-10-CM | POA: Insufficient documentation

## 2021-05-27 DIAGNOSIS — N3011 Interstitial cystitis (chronic) with hematuria: Secondary | ICD-10-CM | POA: Diagnosis not present

## 2021-05-27 DIAGNOSIS — R7303 Prediabetes: Secondary | ICD-10-CM | POA: Diagnosis not present

## 2021-05-27 DIAGNOSIS — R300 Dysuria: Secondary | ICD-10-CM | POA: Insufficient documentation

## 2021-05-27 DIAGNOSIS — R102 Pelvic and perineal pain: Secondary | ICD-10-CM

## 2021-05-27 DIAGNOSIS — R3129 Other microscopic hematuria: Secondary | ICD-10-CM

## 2021-05-27 DIAGNOSIS — F0390 Unspecified dementia without behavioral disturbance: Secondary | ICD-10-CM | POA: Diagnosis not present

## 2021-05-27 DIAGNOSIS — F319 Bipolar disorder, unspecified: Secondary | ICD-10-CM | POA: Insufficient documentation

## 2021-05-27 DIAGNOSIS — Z8249 Family history of ischemic heart disease and other diseases of the circulatory system: Secondary | ICD-10-CM | POA: Insufficient documentation

## 2021-05-27 DIAGNOSIS — F200 Paranoid schizophrenia: Secondary | ICD-10-CM | POA: Insufficient documentation

## 2021-05-27 DIAGNOSIS — Z833 Family history of diabetes mellitus: Secondary | ICD-10-CM | POA: Insufficient documentation

## 2021-05-27 DIAGNOSIS — Z114 Encounter for screening for human immunodeficiency virus [HIV]: Secondary | ICD-10-CM

## 2021-05-27 DIAGNOSIS — Z79899 Other long term (current) drug therapy: Secondary | ICD-10-CM | POA: Insufficient documentation

## 2021-05-27 DIAGNOSIS — Z880 Allergy status to penicillin: Secondary | ICD-10-CM | POA: Insufficient documentation

## 2021-05-27 DIAGNOSIS — R413 Other amnesia: Secondary | ICD-10-CM | POA: Insufficient documentation

## 2021-05-27 DIAGNOSIS — I10 Essential (primary) hypertension: Secondary | ICD-10-CM | POA: Diagnosis not present

## 2021-05-27 DIAGNOSIS — Z87440 Personal history of urinary (tract) infections: Secondary | ICD-10-CM | POA: Insufficient documentation

## 2021-05-27 LAB — POCT URINALYSIS DIP (CLINITEK)
Bilirubin, UA: NEGATIVE
Glucose, UA: NEGATIVE mg/dL
Ketones, POC UA: NEGATIVE mg/dL
Nitrite, UA: NEGATIVE
POC PROTEIN,UA: 30 — AB
Spec Grav, UA: >=1.03 — AB (ref 1.010–1.025)
Urobilinogen, UA: 0.2 E.U./dL
pH, UA: 6 (ref 5.0–8.0)

## 2021-05-27 MED ORDER — PHENAZOPYRIDINE HCL 200 MG PO TABS
200.0000 mg | ORAL_TABLET | Freq: Three times a day (TID) | ORAL | 0 refills | Status: DC | PRN
  Filled 2021-05-27: qty 15, 5d supply, fill #0

## 2021-05-27 MED ORDER — NITROFURANTOIN MONOHYD MACRO 100 MG PO CAPS
100.0000 mg | ORAL_CAPSULE | Freq: Two times a day (BID) | ORAL | 0 refills | Status: AC
  Filled 2021-05-27: qty 10, 5d supply, fill #0

## 2021-05-27 MED ORDER — FLUCONAZOLE 150 MG PO TABS
150.0000 mg | ORAL_TABLET | Freq: Once | ORAL | 0 refills | Status: AC
  Filled 2021-05-27: qty 1, 1d supply, fill #0

## 2021-05-27 MED ORDER — HYDROCHLOROTHIAZIDE 25 MG PO TABS
25.0000 mg | ORAL_TABLET | Freq: Every day | ORAL | 1 refills | Status: DC
  Filled 2021-05-27: qty 90, 90d supply, fill #0

## 2021-05-27 MED ORDER — PHENAZOPYRIDINE HCL 100 MG PO TABS
100.0000 mg | ORAL_TABLET | Freq: Three times a day (TID) | ORAL | 0 refills | Status: DC | PRN
  Filled 2021-05-27: qty 10, 4d supply, fill #0

## 2021-05-27 NOTE — Progress Notes (Signed)
Assessment & Plan:  Jody [name] was seen today for cystitis.  Diagnoses and all orders for this visit:  Burning with urination -     POCT URINALYSIS DIP (CLINITEK) -     fluconazole (DIFLUCAN) 150 MG tablet; Take 1 tablet (150 mg total) by mouth once for 1 dose. -     Cervicovaginal ancillary only -     nitrofurantoin, macrocrystal-monohydrate, (MACROBID) 100 MG capsule; Take 1 capsule (100 mg total) by mouth 2 (two) times daily for 5 days. -     phenazopyridine (PYRIDIUM) 200 MG tablet; Take 1 tablet (200 mg total) by mouth 3 (three) times daily as needed for pain. -     Microscopic Examination REFERRED TO UROLOGY   Other microscopic hematuria -     CULTURE, URINE COMPREHENSIVE -     Urinalysis, Complete  Pelvic pain -     Cervicovaginal ancillary only  Memory loss of unknown cause -     Ambulatory referral to Neurology  Encounter for screening for HIV -     HIV antibody (with reflex)  Essential hypertension -     hydrochlorothiazide (HYDRODIURIL) 25 MG tablet; Take 1 tablet (25 mg total) by mouth daily. -     CMP14+EGFR BP Readings from Last 3 Encounters:  05/27/21 113/77  11/15/20 120/86  01/26/20 126/85   Continue all antihypertensives as prescribed.  Remember to bring in your blood pressure log with you for your follow up appointment.  DASH/Mediterranean Diets are healthier choices for HTN.    Prediabetes -     Hemoglobin A1c  .   Patient has been counseled on age-appropriate routine health concerns for screening and prevention. These are reviewed and up-to-date. Referrals have been placed accordingly. Immunizations are up-to-date or declined.    Subjective:   Chief Complaint  Patient presents with   Cystitis   HPI Jody [name] 50 y.o. female presents to office today with complaints of UTI symptoms and requesting referral to neurology for memory loss.  She has a past medical history of Anxiety and depression, Bipolar disorder, Cystitis, interstitial,  Elevated troponin, Microcytic anemia, Panic attack, Paranoid schizophrenia, and Tobacco abuse.   Memory Loss She has a history of paranoid schizophrenia with auditory hallucinations and currently taking antipsychotics. Endorses worsening memory loss. She has seen neurology for this in the past (4 years ago) and was taking donepezil however she has been lost to follow up and has not taken this medication in quite some time. Short term memory is worse than long term memory. Notices that she forgets parts of a conversation immediately after having them. Sometimes forgets destinations when she is driving.  Also notes difficulty remembering how to spell words. She was able to spell WORLD but declined to spell it backwards.    Urinary Tract Infection: Patient complains of abnormal smelling urine, burning with urination, dysuria, pain in the lower abdomen, and suprapubic pressure She has had symptoms for several days. Patient also complains of back pain. Patient denies fever and vaginal discharge. Patient does have a history of recurrent UTI.  Patient does not have a history of pyelonephritis.  She was previously taking bactrim daily for UTI prophylaxis however she can not recall why she is no longer taking this. She is also not currently seeing Urology and has been lost to follow up as well.   Review of Systems  Constitutional:  Negative for fever, malaise/fatigue and weight loss.  HENT: Negative.  Negative for nosebleeds.   Eyes: Negative.  Negative for blurred vision, double vision and photophobia.  Respiratory: Negative.  Negative for cough and shortness of breath.   Cardiovascular: Negative.  Negative for chest pain, palpitations and leg swelling.  Gastrointestinal: Negative.  Negative for heartburn, nausea and vomiting.  Genitourinary:  Positive for dysuria, flank pain and urgency. Negative for frequency and hematuria.  Musculoskeletal:  Negative for myalgias.  Neurological: Negative.  Negative for  dizziness, focal weakness, seizures and headaches.  Psychiatric/Behavioral:  Positive for depression, hallucinations and memory loss. Negative for suicidal ideas.    Past Medical History:  Diagnosis Date   Anxiety and depression    Bipolar disorder ([place])    Cystitis, interstitial    Elevated troponin    a. 09/2015: CP/elevated troponin up to 0.11 - unclear significance. CT neg for PE, LHC neg for CAD, normal echo.   Microcytic anemia    Noted on labs   Panic attack    Paranoid schizophrenia ([place])    Tobacco abuse     Past Surgical History:  Procedure Laterality Date   CARDIAC CATHETERIZATION N/A 09/29/2015   Procedure: Left Heart Cath and Coronary Angiography;  Surgeon: Jettie Booze, MD;  Location: [facility];  Service: Cardiovascular;  Laterality: N/A;   TUBAL LIGATION      Family History  Problem Relation Age of Onset   Diabetes Father    Hypertension Mother    Hypertension Sister    Lupus Sister    Heart disease Maternal Grandfather    Lupus Other     Social History Reviewed with no changes to be made today.   Outpatient Medications Prior to Visit  Medication Sig Dispense Refill   benztropine (COGENTIN) 0.5 MG tablet Take 0.5 mg by mouth at bedtime.      ferrous gluconate (FERGON) 324 MG tablet Take 1 tablet (324 mg total) by mouth daily with breakfast. 90 tablet 3   risperidone (RISPERDAL) 4 MG tablet Take 2 mg by mouth See admin instructions. Taking 1 tablet (58m) in the am and 2 tablets (813m at bedtime     traZODone (DESYREL) 100 MG tablet Take 1 tablet (100 mg total) by mouth at bedtime as needed for sleep. 90 tablet 1   hydrochlorothiazide (HYDRODIURIL) 25 MG tablet Take 1 tablet (25 mg total) by mouth daily. 90 tablet 1   No facility-administered medications prior to visit.    Allergies  Allergen Reactions   Penicillins Itching and Rash       Objective:    BP 113/77   Pulse 81   Ht '5\' 4"'  (1.626 m)   LMP  (LMP Unknown)   SpO2 99%    BMI 33.99 kg/m  Wt Readings from Last 3 Encounters:  11/15/20 198 lb (89.8 kg)  01/26/20 181 lb (82.1 kg)  06/12/19 193 lb (87.5 kg)    Physical Exam Vitals and nursing note reviewed.  Constitutional:      Appearance: She is well-developed.  HENT:     Head: Normocephalic and atraumatic.  Cardiovascular:     Rate and Rhythm: Normal rate and regular rhythm.     Heart sounds: Normal heart sounds. No murmur heard.   No friction rub. No gallop.  Pulmonary:     Effort: Pulmonary effort is normal. No tachypnea or respiratory distress.     Breath sounds: Normal breath sounds. No decreased breath sounds, wheezing, rhonchi or rales.  Chest:     Chest wall: No tenderness.  Abdominal:     General: Bowel sounds are  normal.     Palpations: Abdomen is soft.     Tenderness: There is abdominal tenderness in the suprapubic area. There is no right CVA tenderness or left CVA tenderness.  Musculoskeletal:        General: Normal range of motion.     Cervical back: Normal range of motion.  Skin:    General: Skin is warm and dry.  Neurological:     Mental Status: She is oriented to person, place, and time.     Coordination: Coordination normal.  Psychiatric:        Behavior: Behavior normal. Behavior is cooperative.        Cognition and Memory: Memory is impaired. She exhibits impaired remote memory.         Patient has been counseled extensively about nutrition and exercise as well as the importance of adherence with medications and regular follow-up. The patient was given clear instructions to go to ER or return to medical center if symptoms don't improve, worsen or new problems develop. The patient verbalized understanding.   Follow-up: Return in about 3 months (around 08/27/2021).   Gildardo Pounds, FNP-BC [facility] [place], [place]   05/28/2021, 2:22 PM

## 2021-05-28 ENCOUNTER — Encounter: Payer: Self-pay | Admitting: Nurse Practitioner

## 2021-05-28 LAB — CMP14+EGFR
ALT: 11 [IU]/L (ref 0–32)
AST: 17 [IU]/L (ref 0–40)
Albumin/Globulin Ratio: 2 (ref 1.2–2.2)
Albumin: 4.9 g/dL — ABNORMAL HIGH (ref 3.8–4.8)
Alkaline Phosphatase: 67 [IU]/L (ref 44–121)
BUN/Creatinine Ratio: 9 (ref 9–23)
BUN: 10 mg/dL (ref 6–24)
Bilirubin Total: 0.3 mg/dL (ref 0.0–1.2)
CO2: 22 mmol/L (ref 20–29)
Calcium: 9.8 mg/dL (ref 8.7–10.2)
Chloride: 100 mmol/L (ref 96–106)
Creatinine, Ser: 1.14 mg/dL — ABNORMAL HIGH (ref 0.57–1.00)
Globulin, Total: 2.4 g/dL (ref 1.5–4.5)
Glucose: 91 mg/dL (ref 65–99)
Potassium: 4 mmol/L (ref 3.5–5.2)
Sodium: 138 mmol/L (ref 134–144)
Total Protein: 7.3 g/dL (ref 6.0–8.5)
eGFR: 59 mL/min/{1.73_m2} — ABNORMAL LOW

## 2021-05-28 LAB — MICROSCOPIC EXAMINATION
Casts: NONE SEEN /lpf
Epithelial Cells (non renal): >10 /hpf — AB (ref 0–10)
RBC, Urine: >30 /hpf — AB (ref 0–2)
WBC, UA: >30 /hpf — AB (ref 0–5)

## 2021-05-28 LAB — URINALYSIS, COMPLETE
Bilirubin, UA: NEGATIVE
Glucose, UA: NEGATIVE
Ketones, UA: NEGATIVE
Nitrite, UA: NEGATIVE
Specific Gravity, UA: 1.023 (ref 1.005–1.030)
Urobilinogen, Ur: 0.2 mg/dL (ref 0.2–1.0)
pH, UA: 6 (ref 5.0–7.5)

## 2021-05-28 LAB — HIV ANTIBODY (ROUTINE TESTING W REFLEX): HIV Screen 4th Generation wRfx: NONREACTIVE

## 2021-05-28 LAB — HEMOGLOBIN A1C
Est. average glucose Bld gHb Est-mCnc: 120 mg/dL
Hgb A1c MFr Bld: 5.8 % — ABNORMAL HIGH (ref 4.8–5.6)

## 2021-05-30 ENCOUNTER — Other Ambulatory Visit: Payer: Self-pay | Admitting: Nurse Practitioner

## 2021-05-30 DIAGNOSIS — I10 Essential (primary) hypertension: Secondary | ICD-10-CM

## 2021-05-31 ENCOUNTER — Telehealth: Payer: Self-pay | Admitting: Nurse Practitioner

## 2021-05-31 ENCOUNTER — Ambulatory Visit: Payer: Self-pay | Admitting: *Deleted

## 2021-05-31 LAB — CERVICOVAGINAL ANCILLARY ONLY
Bacterial Vaginitis (gardnerella): NEGATIVE
Candida Glabrata: NEGATIVE
Candida Vaginitis: NEGATIVE
Chlamydia: NEGATIVE
Comment: NEGATIVE
Comment: NEGATIVE
Comment: NEGATIVE
Comment: NEGATIVE
Comment: NEGATIVE
Comment: NORMAL
Neisseria Gonorrhea: NEGATIVE
Trichomonas: NEGATIVE

## 2021-05-31 NOTE — Telephone Encounter (Signed)
Pt given lab results per notes of Bertram Denver, NP on 05/28/21. Pt verbalized understanding.     Claiborne Rigg, NP  05/28/2021  2:33 PM EDT     I am still waiting on urine culture. Urine shows UTI and possible cystitis. I have referred you back to urology whom you were seeing before. A1c in prediabetes range. Kidney, liver function and electrolytes are normal.

## 2021-05-31 NOTE — Telephone Encounter (Signed)
Patient called and advised Hydrochlorothiazide was sent to [facility] on 05/27/21 #90/1 refill, she verbalized understanding and says she will go by there to pick it up. I advised to call [employer] to let them know so they will not send a request again, she verbalized understanding.

## 2021-05-31 NOTE — Telephone Encounter (Signed)
Patient returning call regarding lab results  Copied from CRM [id number]. Topic: Quick Communication - Lab Results (Clinic Use ONLY) >> May 30, 2021 12:59 PM Royce Macadamia, RN wrote: Called patient to inform them of  lab results. When patient returns call, triage nurse may disclose results.

## 2021-05-31 NOTE — Telephone Encounter (Signed)
Pt given lab results per notes of Z. Meredeth Ide , NP from 05/28/21 on 05/31/21. Pt verbalized understanding and requesting to be notified when urine culture results are back.

## 2021-06-03 ENCOUNTER — Other Ambulatory Visit: Payer: Self-pay

## 2021-06-03 LAB — CULTURE, URINE COMPREHENSIVE

## 2021-06-07 DIAGNOSIS — H524 Presbyopia: Secondary | ICD-10-CM | POA: Diagnosis not present

## 2021-06-10 DIAGNOSIS — R3121 Asymptomatic microscopic hematuria: Secondary | ICD-10-CM | POA: Diagnosis not present

## 2021-06-10 DIAGNOSIS — R300 Dysuria: Secondary | ICD-10-CM | POA: Diagnosis not present

## 2021-06-10 DIAGNOSIS — N3010 Interstitial cystitis (chronic) without hematuria: Secondary | ICD-10-CM | POA: Diagnosis not present

## 2021-06-21 ENCOUNTER — Other Ambulatory Visit: Payer: Self-pay

## 2021-06-26 ENCOUNTER — Ambulatory Visit (HOSPITAL_BASED_OUTPATIENT_CLINIC_OR_DEPARTMENT_OTHER): Payer: Medicare Other

## 2021-06-26 DIAGNOSIS — Z0000 Encounter for general adult medical examination without abnormal findings: Secondary | ICD-10-CM

## 2021-06-26 NOTE — Patient Instructions (Signed)
Health Maintenance, Female Adopting a healthy lifestyle and getting preventive care are important in promoting health and wellness. Ask your health care provider about: The right schedule for you to have regular tests and exams. Things you can do on your own to prevent diseases and keep yourself healthy. What should I know about diet, weight, and exercise? Eat a healthy diet  Eat a diet that includes plenty of vegetables, fruits, low-fat dairy products, and lean protein. Do not eat a lot of foods that are high in solid fats, added sugars, or sodium. Maintain a healthy weight Body mass index (BMI) is used to identify weight problems. It estimates body fat based on height and weight. Your health care provider can help determine your BMI and help you achieve or maintain a healthy weight. Get regular exercise Get regular exercise. This is one of the most important things you can do for your health. Most adults should: Exercise for at least 150 minutes each week. The exercise should increase your heart rate and make you sweat (moderate-intensity exercise). Do strengthening exercises at least twice a week. This is in addition to the moderate-intensity exercise. Spend less time sitting. Even light physical activity can be beneficial. Watch cholesterol and blood lipids Have your blood tested for lipids and cholesterol at 48 years of age, then have this test every 5 years. Have your cholesterol levels checked more often if: Your lipid or cholesterol levels are high. You are older than 48 years of age. You are at high risk for heart disease. What should I know about cancer screening? Depending on your health history and family history, you may need to have cancer screening at various ages. This may include screening for: Breast cancer. Cervical cancer. Colorectal cancer. Skin cancer. Lung cancer. What should I know about heart disease, diabetes, and high blood pressure? Blood pressure and heart  disease High blood pressure causes heart disease and increases the risk of stroke. This is more likely to develop in people who have high blood pressure readings, are of African descent, or are overweight. Have your blood pressure checked: Every 3-5 years if you are 18-39 years of age. Every year if you are 40 years old or older. Diabetes Have regular diabetes screenings. This checks your fasting blood sugar level. Have the screening done: Once every three years after age 40 if you are at a normal weight and have a low risk for diabetes. More often and at a younger age if you are overweight or have a high risk for diabetes. What should I know about preventing infection? Hepatitis B If you have a higher risk for hepatitis B, you should be screened for this virus. Talk with your health care provider to find out if you are at risk for hepatitis B infection. Hepatitis C Testing is recommended for: Everyone born from 1945 through 1965. Anyone with known risk factors for hepatitis C. Sexually transmitted infections (STIs) Get screened for STIs, including gonorrhea and chlamydia, if: You are sexually active and are younger than 48 years of age. You are older than 48 years of age and your health care provider tells you that you are at risk for this type of infection. Your sexual activity has changed since you were last screened, and you are at increased risk for chlamydia or gonorrhea. Ask your health care provider if you are at risk. Ask your health care provider about whether you are at high risk for HIV. Your health care provider may recommend a prescription medicine   to help prevent HIV infection. If you choose to take medicine to prevent HIV, you should first get tested for HIV. You should then be tested every 3 months for as long as you are taking the medicine. Pregnancy If you are about to stop having your period (premenopausal) and you may become pregnant, seek counseling before you get  pregnant. Take 400 to 800 micrograms (mcg) of folic acid every day if you become pregnant. Ask for birth control (contraception) if you want to prevent pregnancy. Osteoporosis and menopause Osteoporosis is a disease in which the bones lose minerals and strength with aging. This can result in bone fractures. If you are 65 years old or older, or if you are at risk for osteoporosis and fractures, ask your health care provider if you should: Be screened for bone loss. Take a calcium or vitamin D supplement to lower your risk of fractures. Be given hormone replacement therapy (HRT) to treat symptoms of menopause. Follow these instructions at home: Lifestyle Do not use any products that contain nicotine or tobacco, such as cigarettes, e-cigarettes, and chewing tobacco. If you need help quitting, ask your health care provider. Do not use street drugs. Do not share needles. Ask your health care provider for help if you need support or information about quitting drugs. Alcohol use Do not drink alcohol if: Your health care provider tells you not to drink. You are pregnant, may be pregnant, or are planning to become pregnant. If you drink alcohol: Limit how much you use to 0-1 drink a day. Limit intake if you are breastfeeding. Be aware of how much alcohol is in your drink. In the [place] one drink equals one 12 oz bottle of beer (355 mL), one 5 oz glass of wine (148 mL), or one 1 oz glass of hard liquor (44 mL). General instructions Schedule regular health, dental, and eye exams. Stay current with your vaccines. Tell your health care provider if: You often feel depressed. You have ever been abused or do not feel safe at home. Summary Adopting a healthy lifestyle and getting preventive care are important in promoting health and wellness. Follow your health care provider's instructions about healthy diet, exercising, and getting tested or screened for diseases. Follow your health care provider's  instructions on monitoring your cholesterol and blood pressure. This information is not intended to replace advice given to you by your health care provider. Make sure you discuss any questions you have with your health care provider. Document Revised: 11/26/2020 Document Reviewed: 09/11/2018 Elsevier Patient Education  2022 Elsevier Inc.  

## 2021-06-26 NOTE — Progress Notes (Deleted)
Subjective:   Jody [name] is a 50 y.o. female who presents for an Initial Medicare Annual Wellness Visit.  I connected with  Jody [name] on 06/26/21 by a audio enabled telemedicine application and verified that I am speaking with the correct person using two identifiers.  Location of patient: Home Location of provider: Office  Persons participating in visit Jody [name]..(patient) Erick Alley RMA   I discussed the limitations of evaluation and management by telemedicine. The patient expressed understanding and agreed to proceed.   Review of Systems    Defer to PCP       Objective:    There were no vitals filed for this visit. There is no height or weight on file to calculate BMI.  Advanced Directives 03/14/2019 03/21/2017 02/01/2017 11/01/2016 05/25/2016 04/27/2016 04/26/2016  Does Patient Have a Medical Advance Directive? No No No No No No No  Would patient like information on creating a medical advance directive? No - Patient declined - - - No - patient declined information No - patient declined information -  Some encounter information is confidential and restricted. Go to Review Flowsheets activity to see all data.    Current Medications (verified) Outpatient Encounter Medications as of 06/26/2021  Medication Sig   benztropine (COGENTIN) 0.5 MG tablet Take 0.5 mg by mouth at bedtime.    ferrous gluconate (FERGON) 324 MG tablet Take 1 tablet (324 mg total) by mouth daily with breakfast.   hydrochlorothiazide (HYDRODIURIL) 25 MG tablet Take 1 tablet (25 mg total) by mouth daily.   phenazopyridine (PYRIDIUM) 200 MG tablet Take 1 tablet (200 mg total) by mouth 3 (three) times daily as needed for pain.   risperidone (RISPERDAL) 4 MG tablet Take 2 mg by mouth See admin instructions. Taking 1 tablet (4mg ) in the am and 2 tablets (8mg ) at bedtime   traZODone (DESYREL) 100 MG tablet Take 1 tablet (100 mg total) by mouth at bedtime as needed for sleep.   No  facility-administered encounter medications on file as of 06/26/2021.    Allergies (verified) Penicillins   History: Past Medical History:  Diagnosis Date   Anxiety and depression    Bipolar disorder (HCC)    Cystitis, interstitial    Elevated troponin    a. 09/2015: CP/elevated troponin up to 0.11 - unclear significance. CT neg for PE, LHC neg for CAD, normal echo.   Microcytic anemia    Noted on labs   Panic attack    Paranoid schizophrenia (HCC)    Tobacco abuse    Past Surgical History:  Procedure Laterality Date   CARDIAC CATHETERIZATION N/A 09/29/2015   Procedure: Left Heart Cath and Coronary Angiography;  Surgeon: 10/2015, MD;  Location: [facility] INVASIVE CV LAB;  Service: Cardiovascular;  Laterality: N/A;   TUBAL LIGATION     Family History  Problem Relation Age of Onset   Diabetes Father    Hypertension Mother    Hypertension Sister    Lupus Sister    Heart disease Maternal Grandfather    Lupus Other    Social History   Socioeconomic History   Marital status: Divorced    Spouse name: Not on file   Number of children: 5   Years of education: 12   Highest education level: Not on file  Occupational History   Occupation: Disabled  Tobacco Use   Smoking status: Former    Packs/day: 0.25    Years: 4.00    Pack years: 1.00    Types:  Cigarettes    Quit date: 05/01/2016    Years since quitting: 5.1   Smokeless tobacco: Never  Vaping Use   Vaping Use: Never used  Substance and Sexual Activity   Alcohol use: Not Currently    Alcohol/week: 0.0 standard drinks    Comment: Quit 2017   Drug use: No   Sexual activity: Not Currently    Birth control/protection: Condom  Other Topics Concern   Not on file  Social History Narrative   Lives alone   Caffeine use: Soda- daily   Right-handed   Social Determinants of Health   Financial Resource Strain: Not on file  Food Insecurity: Not on file  Transportation Needs: Not on file  Physical Activity: Not on  file  Stress: Not on file  Social Connections: Not on file    Tobacco Counseling Counseling given: Not Answered   Clinical Intake:                 Diabetic?***         Activities of Daily Living No flowsheet data found.  Patient Care Team: Claiborne Rigg, NP as PCP - General (Nurse Practitioner)  Indicate any recent Medical Services you may have received from other than [facility] providers in the past year (date may be approximate).     Assessment:   This is a routine wellness examination for Jody [name].  Hearing/Vision screen No results found.  Dietary issues and exercise activities discussed:     Goals Addressed   None   Depression Screen PHQ 2/9 Scores 05/27/2021 11/06/2019 06/12/2019 03/12/2019 06/10/2018 01/08/2018 03/21/2017  PHQ - 2 Score 2 2 2 2 2 2 2   PHQ- 9 Score 9 3 12 12 10 10 15     Fall Risk Fall Risk  05/27/2021 11/06/2019  Falls in the past year? 0 0  Number falls in past yr: 0 0  Injury with Fall? 0 -    FALL RISK PREVENTION PERTAINING TO THE HOME:  Any stairs in or around the home? {YES/NO:21197} If so, are there any without handrails? {YES/NO:21197} Home free of loose throw rugs in walkways, pet beds, electrical cords, etc? {YES/NO:21197} Adequate lighting in your home to reduce risk of falls? {YES/NO:21197}  ASSISTIVE DEVICES UTILIZED TO PREVENT FALLS:  Life alert? {YES/NO:21197} Use of a cane, walker or w/c? {YES/NO:21197} Grab bars in the bathroom? {YES/NO:21197} Shower chair or bench in shower? {YES/NO:21197} Elevated toilet seat or a handicapped toilet? {YES/NO:21197}  TIMED UP AND GO:  Was the test performed? {YES/NO:21197}.  Length of time to ambulate 10 feet: *** sec.   {Appearance of Gait:2101803}  Cognitive Function: MMSE - Mini Mental State Exam 01/31/2017 11/27/2016 06/28/2016 05/25/2016 03/11/2015  Orientation to time 5 0 2 2 4   Orientation to Place 4 2 3 3 5   Registration 3 2 3 3 3   Attention/ Calculation 2 0 0 0 5   Recall 2 0 1 2 1   Language- name 2 objects 2 2 2 2 2   Language- repeat 1 1 1  0 1  Language- follow 3 step command 3 2 3 3 3   Language- read & follow direction 1 1 1  0 1  Write a sentence 1 0 1 0 1  Copy design 1 1 1  0 0  Total score 25 11 18 15 26         Immunizations Immunization History  Administered Date(s) Administered   Influenza,inj,Quad PF,6+ Mos 07/05/2014, 09/22/2014   Moderna Sars-Covid-2 Vaccination 01/01/2020, 02/03/2020, 10/14/2020   Pneumococcal Polysaccharide-23 07/05/2014  Tdap 02/01/2017    TDAP status: Up to date  {Flu Vaccine status:2101806}  Pneumococcal vaccine status: Up to date  Covid-19 vaccine status: Completed vaccines  Qualifies for Shingles Vaccine? No   Zostavax completed No   Shingrix Completed?: No.    Education has been provided regarding the importance of this vaccine. Patient has been advised to call insurance company to determine out of pocket expense if they have not yet received this vaccine. Advised may also receive vaccine at local pharmacy or Health Dept. Verbalized acceptance and understanding.  Screening Tests Health Maintenance  Topic Date Due   COLONOSCOPY (Pts 45-27yrs Insurance coverage will need to be confirmed)  Never done   PAP SMEAR-Modifier  03/21/2020   INFLUENZA VACCINE  05/02/2021   TETANUS/TDAP  02/02/2027   COVID-19 Vaccine  Completed   Hepatitis C Screening  Completed   HIV Screening  Completed   HPV VACCINES  Aged Out    Health Maintenance  Health Maintenance Due  Topic Date Due   COLONOSCOPY (Pts 45-87yrs Insurance coverage will need to be confirmed)  Never done   PAP SMEAR-Modifier  03/21/2020   INFLUENZA VACCINE  05/02/2021    {Colorectal cancer screening:2101809}  {Mammogram status:21018020}  {Bone Density status:21018021}  Lung Cancer Screening: (Low Dose CT Chest recommended if Age 12-80 years, 30 pack-year currently smoking OR have quit w/in 15years.) {DOES NOT does:27190::"does not"}  qualify.   Lung Cancer Screening Referral: ***  Additional Screening:  Hepatitis C Screening: {DOES NOT does:27190::"does not"} qualify; Completed ***  Vision Screening: Recommended annual ophthalmology exams for early detection of glaucoma and other disorders of the eye. Is the patient up to date with their annual eye exam?  {YES/NO:21197} Who is the provider or what is the name of the office in which the patient attends annual eye exams? *** If pt is not established with a provider, would they like to be referred to a provider to establish care? {YES/NO:21197}.   Dental Screening: Recommended annual dental exams for proper oral hygiene  Community Resource Referral / Chronic Care Management: CRR required this visit?  {YES/NO:21197}  CCM required this visit?  {YES/NO:21197}     Plan:     I have personally reviewed and noted the following in the patient's chart:   Medical and social history Use of alcohol, tobacco or illicit drugs  Current medications and supplements including opioid prescriptions. {Opioid Prescriptions:[phone]} Functional ability and status Nutritional status Physical activity Advanced directives List of other physicians Hospitalizations, surgeries, and ER visits in previous 12 months Vitals Screenings to include cognitive, depression, and falls Referrals and appointments  In addition, I have reviewed and discussed with patient certain preventive protocols, quality metrics, and best practice recommendations. A written personalized care plan for preventive services as well as general preventive health recommendations were provided to patient.     Erick Alley, CMA   06/26/2021   Nurse Notes: ***

## 2021-06-27 NOTE — Progress Notes (Signed)
A user error has taken place: orders placed in error, patient canceled appt

## 2021-07-11 ENCOUNTER — Ambulatory Visit: Payer: Medicare Other | Admitting: Nurse Practitioner

## 2021-07-14 DIAGNOSIS — Z23 Encounter for immunization: Secondary | ICD-10-CM | POA: Diagnosis not present

## 2021-09-01 DIAGNOSIS — F251 Schizoaffective disorder, depressive type: Secondary | ICD-10-CM | POA: Diagnosis not present

## 2021-10-25 ENCOUNTER — Other Ambulatory Visit: Payer: Self-pay | Admitting: Nurse Practitioner

## 2021-10-25 DIAGNOSIS — I10 Essential (primary) hypertension: Secondary | ICD-10-CM

## 2021-10-25 NOTE — Telephone Encounter (Signed)
Filled for 90 day supply 09/28/21.\ Requested Prescriptions  Pending Prescriptions Disp Refills   hydrochlorothiazide (HYDRODIURIL) 25 MG tablet [Pharmacy Med Name: hydroCHLOROthiazide 25MG  TABS*] 30 tablet     Sig: TAKE 1 TABLET BY MOUTH DAILY     Cardiovascular: Diuretics - Thiazide Failed - 10/25/2021  4:05 PM      Failed - Cr in normal range and within 360 days    Creat  Date Value Ref Range Status  11/01/2016 0.89 0.50 - 1.10 mg/dL Final   Creatinine, Ser  Date Value Ref Range Status  05/27/2021 1.14 (H) 0.57 - 1.00 mg/dL Final   Creatinine, POC  Date Value Ref Range Status  02/01/2017 50 mg/dL Final         Passed - Ca in normal range and within 360 days    Calcium  Date Value Ref Range Status  05/27/2021 9.8 8.7 - 10.2 mg/dL Final         Passed - K in normal range and within 360 days    Potassium  Date Value Ref Range Status  05/27/2021 4.0 3.5 - 5.2 mmol/L Final         Passed - Na in normal range and within 360 days    Sodium  Date Value Ref Range Status  05/27/2021 138 134 - 144 mmol/L Final         Passed - Last BP in normal range    BP Readings from Last 1 Encounters:  05/27/21 113/77         Passed - Valid encounter within last 6 months    Recent Outpatient Visits          5 months ago Burning with urination   [place], Vernia Buff, NP   11 months ago Essential hypertension   [place], Vernia Buff, NP   1 year ago Vaginal itching   [place], [place], NP   1 year ago Insomnia, unspecified type   [place], [place], [place]   2 years ago Acute cystitis without hematuria   [place], [place], [place]

## 2021-10-27 ENCOUNTER — Other Ambulatory Visit: Payer: Self-pay

## 2021-10-27 NOTE — Telephone Encounter (Signed)
[place] pharmacy called about the denial for Hydrochlorothiazide / pharmacist stated that they started filling her packs on 10.19.22 for 30 days at a time for the 3 months supply that was transferred to them from [facility] pharmacy and pt needs a refill / the Rx refill was denied due to requesting too early / but [facility] pharmacist also stated that the pt filled this with them on 9.20.22/ pt is stating she has no pills left and must have misplaced them / please advise and send refill to  [facility], [place] - [address] [place] [address] Phone:  [phone]  Fax:  [phone]

## 2021-10-27 NOTE — Telephone Encounter (Signed)
Requested medication (s) are due for refill today: see note, expired medication  Requested medication (s) are on the active medication list: yes  Last refill:  03/27/21-08/25/21 #90 1 refill   Future visit scheduled: no   Notes to clinic:  expired medication . Do you want to renew Rx? Previous requests denied due to requesting too early. Please review     Requested Prescriptions  Pending Prescriptions Disp Refills   hydrochlorothiazide (HYDRODIURIL) 25 MG tablet 90 tablet 1    Sig: Take 1 tablet (25 mg total) by mouth daily.     Cardiovascular: Diuretics - Thiazide Failed - 10/27/2021  4:45 PM      Failed - Cr in normal range and within 360 days    Creat  Date Value Ref Range Status  11/01/2016 0.89 0.50 - 1.10 mg/dL Final   Creatinine, Ser  Date Value Ref Range Status  05/27/2021 1.14 (H) 0.57 - 1.00 mg/dL Final   Creatinine, POC  Date Value Ref Range Status  02/01/2017 50 mg/dL Final          Passed - Ca in normal range and within 360 days    Calcium  Date Value Ref Range Status  05/27/2021 9.8 8.7 - 10.2 mg/dL Final          Passed - K in normal range and within 360 days    Potassium  Date Value Ref Range Status  05/27/2021 4.0 3.5 - 5.2 mmol/L Final          Passed - Na in normal range and within 360 days    Sodium  Date Value Ref Range Status  05/27/2021 138 134 - 144 mmol/L Final          Passed - Last BP in normal range    BP Readings from Last 1 Encounters:  05/27/21 113/77          Passed - Valid encounter within last 6 months    Recent Outpatient Visits           5 months ago Burning with urination   [place], Vernia Buff, NP   11 months ago Essential hypertension   [place], Vernia Buff, NP   1 year ago Vaginal itching   [place], [place], NP   1 year ago Insomnia, unspecified type   [place], [place], [place]   2 years ago Acute cystitis without hematuria   [place], [place] M, [place]              Refused Prescriptions Disp Refills   hydrochlorothiazide (HYDRODIURIL) 25 MG tablet [Pharmacy Med Name: hydroCHLOROthiazide 25MG  TABS*] 30 tablet     Sig: TAKE 1 TABLET BY MOUTH DAILY     Cardiovascular: Diuretics - Thiazide Failed - 10/27/2021  4:45 PM      Failed - Cr in normal range and within 360 days    Creat  Date Value Ref Range Status  11/01/2016 0.89 0.50 - 1.10 mg/dL Final   Creatinine, Ser  Date Value Ref Range Status  05/27/2021 1.14 (H) 0.57 - 1.00 mg/dL Final   Creatinine, POC  Date Value Ref Range Status  02/01/2017 50 mg/dL Final          Passed - Ca in normal range and within 360 days    Calcium  Date Value Ref Range Status  05/27/2021 9.8 8.7 - 10.2 mg/dL Final          Passed - K in normal range and within 360 days    Potassium  Date Value Ref Range Status  05/27/2021 4.0 3.5 - 5.2 mmol/L Final          Passed - Na in normal range and within 360 days    Sodium  Date Value Ref Range Status  05/27/2021 138 134 - 144 mmol/L Final          Passed - Last BP in normal range    BP Readings from Last 1 Encounters:  05/27/21 113/77          Passed - Valid encounter within last 6 months    Recent Outpatient Visits           5 months ago Burning with urination   [place], Vernia Buff, NP   11 months ago Essential hypertension   [place], Vernia Buff, NP   1 year ago Vaginal itching   [place], [place], NP   1 year ago Insomnia, unspecified type   [place], [place]   2 years ago Acute cystitis without hematuria   [place], [place], [place]

## 2021-10-27 NOTE — Addendum Note (Signed)
Addended by: Wilford Corner on: 10/27/2021 04:45 PM   Modules accepted: Orders

## 2021-10-28 MED ORDER — HYDROCHLOROTHIAZIDE 25 MG PO TABS: 25.0000 mg | ORAL_TABLET | Freq: Every day | ORAL | 0 refills | Status: DC

## 2021-10-28 NOTE — Addendum Note (Signed)
Addended by: Lois Huxley, Jeannett Senior L on: 10/28/2021 05:21 PM   Modules accepted: Orders

## 2021-11-17 ENCOUNTER — Other Ambulatory Visit: Payer: Self-pay | Admitting: Nurse Practitioner

## 2021-11-17 NOTE — Telephone Encounter (Signed)
Requested medication (s) are due for refill today: no requesting early  Requested medication (s) are on the active medication list: yes  Last refill:  11/25/21 for 90 day supply  Future visit scheduled: no, canceled last appt  Notes to clinic:  failed protocol due to labs are out of date, please assess.   Requested Prescriptions  Pending Prescriptions Disp Refills   ferrous gluconate (FERGON) 324 MG tablet [Pharmacy Med Name: Ferrous Gluconate 324 (38 Fe)MG TABS] 30 tablet     Sig: TAKE 1 TABLET BY MOUTH EVERY MORNING WITH BREAKFAST     Endocrinology:  Minerals - Iron Supplementation Failed - 11/17/2021  9:58 AM      Failed - HGB in normal range and within 360 days    Hemoglobin  Date Value Ref Range Status  11/15/2020 12.8 11.1 - 15.9 g/dL Final          Failed - HCT in normal range and within 360 days    Hematocrit  Date Value Ref Range Status  11/15/2020 40.7 34.0 - 46.6 % Final          Failed - RBC in normal range and within 360 days    RBC  Date Value Ref Range Status  11/15/2020 5.50 (H) 3.77 - 5.28 x10E6/uL Final  03/14/2019 5.39 (H) 3.87 - 5.11 MIL/uL Final          Failed - Fe (serum) in normal range and within 360 days    Iron  Date Value Ref Range Status  06/10/2018 91 27 - 159 ug/dL Final   Iron Saturation  Date Value Ref Range Status  06/10/2018 30 15 - 55 % Final          Failed - Ferritin in normal range and within 360 days    Ferritin  Date Value Ref Range Status  06/10/2018 66 15 - 150 ng/mL Final          Passed - Valid encounter within last 12 months    Recent Outpatient Visits           5 months ago Burning with urination   [place], Vernia Buff, NP   1 year ago Essential hypertension   [place], Vernia Buff, NP   1 year ago Vaginal itching   [place], [place], NP   2 years ago Insomnia, unspecified type   [place], [place]   2 years ago Acute cystitis without hematuria   [place], [place], [place]

## 2021-11-18 ENCOUNTER — Other Ambulatory Visit: Payer: Self-pay | Admitting: Family Medicine

## 2021-11-18 DIAGNOSIS — I10 Essential (primary) hypertension: Secondary | ICD-10-CM

## 2021-11-24 ENCOUNTER — Other Ambulatory Visit: Payer: Self-pay | Admitting: Family Medicine

## 2021-11-24 DIAGNOSIS — I10 Essential (primary) hypertension: Secondary | ICD-10-CM

## 2021-11-24 NOTE — Telephone Encounter (Signed)
Requested medications are due for refill today.  yes  Requested medications are on the active medications list.  yes  Last refill. 10/28/2021 #30 0 refills  Future visit scheduled.   yes  Notes to clinic.  Pt is out of medication. Courtesy refill already given.    Requested Prescriptions  Pending Prescriptions Disp Refills   hydrochlorothiazide (HYDRODIURIL) 25 MG tablet [Pharmacy Med Name: hydroCHLOROthiazide 25MG  TABS*] 30 tablet 0    Sig: TAKE 1 TABLET BY MOUTH DAILY     Cardiovascular: Diuretics - Thiazide Failed - 11/24/2021 11:51 AM      Failed - Cr in normal range and within 180 days    Creat  Date Value Ref Range Status  11/01/2016 0.89 0.50 - 1.10 mg/dL Final   Creatinine, Ser  Date Value Ref Range Status  05/27/2021 1.14 (H) 0.57 - 1.00 mg/dL Final   Creatinine, POC  Date Value Ref Range Status  02/01/2017 50 mg/dL Final          Failed - K in normal range and within 180 days    Potassium  Date Value Ref Range Status  05/27/2021 4.0 3.5 - 5.2 mmol/L Final          Failed - Na in normal range and within 180 days    Sodium  Date Value Ref Range Status  05/27/2021 138 134 - 144 mmol/L Final          Failed - Valid encounter within last 6 months    Recent Outpatient Visits           6 months ago Burning with urination   [facility] [place], Vernia Buff, NP   1 year ago Essential hypertension   [place], Vernia Buff, NP   1 year ago Vaginal itching   [place], [place], NP   2 years ago Insomnia, unspecified type   [facility] [place], [place], [place]   2 years ago Acute cystitis without hematuria   [facility] [place], Dionne Bu[place], [place]       Future Appointments             In 2 months Gildardo Pounds, NP [place] - Last BP in normal  range    BP Readings from Last 1 Encounters:  05/27/21 113/77

## 2021-11-24 NOTE — Telephone Encounter (Signed)
Pt is scheduled for next available appt with PCP, says she is completely out of her current supply. Please advise

## 2021-12-13 ENCOUNTER — Ambulatory Visit: Payer: Self-pay | Admitting: *Deleted

## 2021-12-13 NOTE — Telephone Encounter (Signed)
Patient called back to see if PCP has placed referral to breast center. Reviewed with patient PCP will have up to 48-72 hours to respond to request. Please notify patient when referral is made. Instructed patient when referral has been made the breast center will contact her for appt.  Patient anxious and verbalized understanding. Provided emotional support to patient.  ?

## 2021-12-13 NOTE — Telephone Encounter (Signed)
Reason for Disposition ?? [1] Breast pain AND [2] cause is not known ? ?Answer Assessment - Initial Assessment Questions ?1. SYMPTOM: "What's the main symptom you're concerned about?"  (e.g., lump, pain, rash, nipple discharge) ?    Pain, tender to touch at times ?2. LOCATION: "Where is the  located?" ?    Left breast, top of breast ?3. ONSET: "When did   start?" ?    1 month ago ?4. PRIOR HISTORY: "Do you have any history of prior problems with your breasts?" (e.g., lumps, cancer, fibrocystic breast disease) ?    no ?5. CAUSE: "What do you think is causing this symptom?" ?    unsure ?6. OTHER SYMPTOMS: "Do you have any other symptoms?" (e.g., fever, breast pain, redness or rash, nipple discharge) ?    no ? ?Protocols used: Breast Symptoms-A-AH ? ?

## 2021-12-13 NOTE — Telephone Encounter (Signed)
?  Chief Complaint: Breast tenderness ?Symptoms: left breast with painful area, top of breast above nipple. ?Frequency: onset 1 month ago, tender to touch ?Pertinent Negatives: Patient denies lump, nipple discharge, no change in appearance of breast. ?Disposition: [] ED /[] Urgent Care (no appt availability in office) / [] Appointment(In office/virtual)/ []  [facility] [facility]/ [] Home Care/ [] Refused Recommended Disposition /[] [facility]/ [x]  Follow-up with PCP ?Additional Notes:  Pt requesting [facility] to [facility] for [facility]. States last mammogram 2021. Please advise. ?

## 2021-12-14 ENCOUNTER — Other Ambulatory Visit: Payer: Self-pay | Admitting: Nurse Practitioner

## 2021-12-14 DIAGNOSIS — Z1231 Encounter for screening mammogram for malignant neoplasm of breast: Secondary | ICD-10-CM

## 2021-12-14 NOTE — Telephone Encounter (Signed)
Referral has been placed. She had several mammogram orders placed last year as well as [place]. She never followed up and they were canceled just FYI. They are all under the canceled imaging tab ?

## 2021-12-14 NOTE — Telephone Encounter (Signed)
Please advise on request for referral.

## 2021-12-15 NOTE — Telephone Encounter (Signed)
Left message on voicemail notifying that requested referral was placed.  ?

## 2021-12-16 ENCOUNTER — Other Ambulatory Visit: Payer: Self-pay | Admitting: Family Medicine

## 2021-12-16 DIAGNOSIS — F251 Schizoaffective disorder, depressive type: Secondary | ICD-10-CM | POA: Diagnosis not present

## 2021-12-16 NOTE — Telephone Encounter (Signed)
Requested medication (s) are due for refill today - yes ? ?Requested medication (s) are on the active medication list -yes ? ?Future visit scheduled -yes ? ?Last refill: 11/18/21 #30 ? ?Notes to clinic: Request RF: fails lab protocol ? ?Requested Prescriptions  ?Pending Prescriptions Disp Refills  ? ferrous gluconate (FERGON) 324 MG tablet [Pharmacy Med Name: Ferrous Gluconate 324 (38 Fe)MG TABS] 30 tablet 0  ?  Sig: TAKE 1 TABLET BY MOUTH EVERY MORNING WITH BREAKFAST  ?  ? Endocrinology:  Minerals - Iron Supplementation Failed - 12/16/2021  1:27 PM  ?  ?  Failed - HGB in normal range and within 360 days  ?  Hemoglobin  ?Date Value Ref Range Status  ?11/15/2020 12.8 11.1 - 15.9 g/dL Final  ?  ?  ?  ?  Failed - HCT in normal range and within 360 days  ?  Hematocrit  ?Date Value Ref Range Status  ?11/15/2020 40.7 34.0 - 46.6 % Final  ?  ?  ?  ?  Failed - RBC in normal range and within 360 days  ?  RBC  ?Date Value Ref Range Status  ?11/15/2020 5.50 (H) 3.77 - 5.28 x10E6/uL Final  ?03/14/2019 5.39 (H) 3.87 - 5.11 MIL/uL Final  ?  ?  ?  ?  Failed - Fe (serum) in normal range and within 360 days  ?  Iron  ?Date Value Ref Range Status  ?06/10/2018 91 27 - 159 ug/dL Final  ? ?Iron Saturation  ?Date Value Ref Range Status  ?06/10/2018 30 15 - 55 % Final  ?  ?  ?  ?  Failed - Ferritin in normal range and within 360 days  ?  Ferritin  ?Date Value Ref Range Status  ?06/10/2018 66 15 - 150 ng/mL Final  ?  ?  ?  ?  Passed - Valid encounter within last 12 months  ?  Recent Outpatient Visits   ? ?      ? 6 months ago Burning with urination  ? [facility] [place], Shea Stakes, NP  ? 1 year ago Essential hypertension  ? [facility] [place], Shea Stakes, NP  ? 1 year ago Vaginal itching  ? [facility] [place], Virginia J, NP  ? 2 years ago Insomnia, unspecified type  ? [facility] [place], [place], [place]  ? 2 years ago Acute  cystitis without hematuria  ? [facility] [place], [place], [place]  ? ?  ?  ?Future Appointments   ? ?        ? In 1 month Claiborne Rigg, NP [facility]  ? ?  ? ?  ?  ?  ? ? ? ?Requested Prescriptions  ?Pending Prescriptions Disp Refills  ? ferrous gluconate (FERGON) 324 MG tablet [Pharmacy Med Name: Ferrous Gluconate 324 (38 Fe)MG TABS] 30 tablet 0  ?  Sig: TAKE 1 TABLET BY MOUTH EVERY MORNING WITH BREAKFAST  ?  ? Endocrinology:  Minerals - Iron Supplementation Failed - 12/16/2021  1:27 PM  ?  ?  Failed - HGB in normal range and within 360 days  ?  Hemoglobin  ?Date Value Ref Range Status  ?11/15/2020 12.8 11.1 - 15.9 g/dL Final  ?  ?  ?  ?  Failed - HCT in normal range and within 360 days  ?  Hematocrit  ?Date Value  Ref Range Status  ?11/15/2020 40.7 34.0 - 46.6 % Final  ?  ?  ?  ?  Failed - RBC in normal range and within 360 days  ?  RBC  ?Date Value Ref Range Status  ?11/15/2020 5.50 (H) 3.77 - 5.28 x10E6/uL Final  ?03/14/2019 5.39 (H) 3.87 - 5.11 MIL/uL Final  ?  ?  ?  ?  Failed - Fe (serum) in normal range and within 360 days  ?  Iron  ?Date Value Ref Range Status  ?06/10/2018 91 27 - 159 ug/dL Final  ? ?Iron Saturation  ?Date Value Ref Range Status  ?06/10/2018 30 15 - 55 % Final  ?  ?  ?  ?  Failed - Ferritin in normal range and within 360 days  ?  Ferritin  ?Date Value Ref Range Status  ?06/10/2018 66 15 - 150 ng/mL Final  ?  ?  ?  ?  Passed - Valid encounter within last 12 months  ?  Recent Outpatient Visits   ? ?      ? 6 months ago Burning with urination  ? [facility] [place], Shea Stakes, NP  ? 1 year ago Essential hypertension  ? [facility] [place], Shea Stakes, NP  ? 1 year ago Vaginal itching  ? [facility] [place], Virginia J, NP  ? 2 years ago Insomnia, unspecified type  ? [facility] [place], [place], [place]  ? 2 years ago Acute  cystitis without hematuria  ? [facility] [place], [place], [place]  ? ?  ?  ?Future Appointments   ? ?        ? In 1 month Claiborne Rigg, NP [facility]  ? ?  ? ?  ?  ?  ? ? ? ?

## 2021-12-23 ENCOUNTER — Telehealth: Payer: Self-pay | Admitting: Nurse Practitioner

## 2021-12-23 NOTE — Telephone Encounter (Signed)
Given phone number - [phone] to schedule. ?Referral was sent on 12/14/2021.  ?

## 2021-12-23 NOTE — Telephone Encounter (Signed)
Copied from CRM [id number]. Topic: Referral - Question ?>> Dec 22, 2021  4:32 PM Gaetana Michaelis A wrote: ?Reason for CRM: The patient has called for an update on the status of their referral for a mammogram  ? ?The patient was told they would be referred to [facility] on [address]  ? ?The patient would like to know when they can expect contact from either practice about this referral  ? ?Please contact further ?

## 2021-12-26 ENCOUNTER — Ambulatory Visit: Payer: Self-pay | Admitting: *Deleted

## 2021-12-26 ENCOUNTER — Telehealth: Payer: Self-pay | Admitting: Nurse Practitioner

## 2021-12-26 NOTE — Telephone Encounter (Signed)
It was ordered on 3-15. Why would it need to be updated????? She can call the breast center and schedule her mammogram. ?

## 2021-12-26 NOTE — Telephone Encounter (Signed)
She can call the breast center to schedule her mammogram that is already in her chart. Her mammogram is overdue and has been ordered a while back.  ?

## 2021-12-26 NOTE — Telephone Encounter (Signed)
?  Chief Complaint: breast pain- R,possible lump ?Symptoms: patient states she has been having breast pain- but also thinks she may have breast lump ?Frequency: 1 month ?Pertinent Negatives: Patient denies   ?Disposition: [] ED /[] Urgent Care (no appt availability in office) / [] Appointment(In office/virtual)/ []  [place] [facility]/ [] Home Care/ [] Refused Recommended Disposition /[] [place] [facility]/ []  Follow-up with PCP ?Additional Notes: Patient is requesting referral to Breast Center for imaging. Patient advised she may need appointment for breast exam first- no open appointment within disposition- will send message for review  ? ?Reason for Disposition ? Breast lump ? ?Answer Assessment - Initial Assessment Questions ?1. SYMPTOM: "What's the main symptom you're concerned about?"  (e.g., lump, pain, rash, nipple discharge) ?    Breast pain ?2. LOCATION: "Where is the pain located?" ?    Left breast pain ?3. ONSET: "When did pain  start?" ?    1 month ?4. PRIOR HISTORY: "Do you have any history of prior problems with your breasts?" (e.g., lumps, cancer, fibrocystic breast disease) ?    no ?5. CAUSE: "What do you think is causing this symptom?" ?    Not sure- maybe hard area ?6. OTHER SYMPTOMS: "Do you have any other symptoms?" (e.g., fever, breast pain, redness or rash, nipple discharge) ?    Breast pain ?7. PREGNANCY-BREASTFEEDING: "Is there any chance you are pregnant?" "When was your last menstrual period?" "Are you breastfeeding?" ? ?Protocols used: Breast Symptoms-A-AH ? ?

## 2021-12-26 NOTE — Telephone Encounter (Signed)
Pt is calling to see if the date on the referral to the [facility] can be updated? Please advise CB- [phone] ?

## 2021-12-27 ENCOUNTER — Ambulatory Visit: Payer: Self-pay

## 2021-12-27 ENCOUNTER — Other Ambulatory Visit: Payer: Self-pay | Admitting: Nurse Practitioner

## 2021-12-27 DIAGNOSIS — N644 Mastodynia: Secondary | ICD-10-CM

## 2021-12-27 NOTE — Telephone Encounter (Signed)
Patient is aware of order being placed and will contact the breast center for scheduling. Patient will also keep FU appointment with PCP on tomorrow at 10:50. ?

## 2021-12-27 NOTE — Telephone Encounter (Signed)
?  Chief Complaint: Breast pain - possible lump ?Symptoms: Pain  - left lower outside quad.Also has nipple pain ?Frequency: comes and goes over the past month ?Pertinent Negatives: Patient denies nipple discharge ?Disposition: [] ED /[] Urgent Care (no appt availability in office) / [x] Appointment(In office/virtual)/ []  [place] [facility]/ [] Home Care/ [] Refused Recommended Disposition /[] [place] Mobile [facility]/ []  Follow-up with PCP ?Additional Notes: Pt wants to have this checked out.  ?Reason for Disposition ? Breast lump ? ?Answer Assessment - Initial Assessment Questions ?1. SYMPTOM: "What's the main symptom you're concerned about?"  (e.g., lump, pain, rash, nipple discharge) ?    Painful left breast  ?2. LOCATION: "Where is the pain located?" ?    Lower left - outside ?3. ONSET: "When did pain  start?" ?    1 month ?4. PRIOR HISTORY: "Do you have any history of prior problems with your breasts?" (e.g., lumps, cancer, fibrocystic breast disease) ?    no ?5. CAUSE: "What do you think is causing this symptom?" ?    no ?6. OTHER SYMPTOMS: "Do you have any other symptoms?" (e.g., fever, breast pain, redness or rash, nipple discharge) ?    Nipple is sore ?7. PREGNANCY-BREASTFEEDING: "Is there any chance you are pregnant?" "When was your last menstrual period?" "Are you breastfeeding?" ?    no ? ?Protocols used: Breast Symptoms-A-AH ? ?

## 2021-12-27 NOTE — Telephone Encounter (Signed)
It has been ordered.

## 2021-12-27 NOTE — Telephone Encounter (Signed)
Pt called in stating they keep telling her that they need a newer one, pt requested if a newer updated order can be sent over, please advise.  ?

## 2021-12-28 ENCOUNTER — Other Ambulatory Visit: Payer: Self-pay | Admitting: Nurse Practitioner

## 2021-12-28 ENCOUNTER — Ambulatory Visit: Payer: Medicare Other | Attending: Nurse Practitioner | Admitting: Nurse Practitioner

## 2021-12-28 ENCOUNTER — Encounter: Payer: Self-pay | Admitting: Nurse Practitioner

## 2021-12-28 ENCOUNTER — Other Ambulatory Visit: Payer: Self-pay

## 2021-12-28 VITALS — BP 134/84 | HR 57 | Wt 188.2 lb

## 2021-12-28 DIAGNOSIS — Z862 Personal history of diseases of the blood and blood-forming organs and certain disorders involving the immune mechanism: Secondary | ICD-10-CM

## 2021-12-28 DIAGNOSIS — N643 Galactorrhea not associated with childbirth: Secondary | ICD-10-CM | POA: Insufficient documentation

## 2021-12-28 DIAGNOSIS — R109 Unspecified abdominal pain: Secondary | ICD-10-CM | POA: Insufficient documentation

## 2021-12-28 DIAGNOSIS — F200 Paranoid schizophrenia: Secondary | ICD-10-CM | POA: Diagnosis not present

## 2021-12-28 DIAGNOSIS — Z1211 Encounter for screening for malignant neoplasm of colon: Secondary | ICD-10-CM

## 2021-12-28 DIAGNOSIS — D509 Iron deficiency anemia, unspecified: Secondary | ICD-10-CM | POA: Insufficient documentation

## 2021-12-28 DIAGNOSIS — R7303 Prediabetes: Secondary | ICD-10-CM | POA: Insufficient documentation

## 2021-12-28 DIAGNOSIS — Z79899 Other long term (current) drug therapy: Secondary | ICD-10-CM | POA: Diagnosis not present

## 2021-12-28 DIAGNOSIS — N644 Mastodynia: Secondary | ICD-10-CM | POA: Diagnosis not present

## 2021-12-28 DIAGNOSIS — F319 Bipolar disorder, unspecified: Secondary | ICD-10-CM | POA: Insufficient documentation

## 2021-12-28 DIAGNOSIS — E785 Hyperlipidemia, unspecified: Secondary | ICD-10-CM | POA: Insufficient documentation

## 2021-12-28 MED ORDER — TIZANIDINE HCL 4 MG PO TABS: 4.0000 mg | ORAL_TABLET | Freq: Four times a day (QID) | ORAL | 1 refills | Status: DC | PRN

## 2021-12-28 MED ORDER — DICLOFENAC SODIUM 1 % EX GEL
4.0000 g | Freq: Four times a day (QID) | CUTANEOUS | 3 refills | Status: AC
Start: 2021-12-28 — End: 2022-01-27

## 2021-12-28 NOTE — Progress Notes (Signed)
? ?Assessment & Plan:  ?[name] was seen today for breast pain. ? ?Diagnoses and all orders for this visit: ? ?Pain of left breast ? ?Colon cancer screening ?-     Ambulatory referral to Gastroenterology ? ?History of anemia ?-     CBC ? ?Dyslipidemia, goal LDL below 100 ?-     Lipid panel ? ?Microcytic anemia ?-     CBC ? ?Galactorrhea ?-     CMP14+EGFR ?-     Prolactin ? ?Prediabetes ?-     Hemoglobin A1c ? ?Right flank pain ?-     diclofenac Sodium (VOLTAREN) 1 % GEL; Apply 4 g topically 4 (four) times daily. ?-     Urinalysis, Complete ?-     tiZANidine (ZANAFLEX) 4 MG tablet; Take 1 tablet (4 mg total) by mouth every 6 (six) hours as needed for muscle spasms. ? ? ? ?Patient has been counseled on age-appropriate routine health concerns for screening and prevention. These are reviewed and up-to-date. Referrals have been placed accordingly. Immunizations are up-to-date or declined.    ?Subjective:  ? ?Chief Complaint  ?Patient presents with  ? Breast Pain  ? ?HPI ?Jody [name] 51 y.o. female presents to office today with complaints of right breast pain and bilateral nipple galactorrhea.  ?She has a history of bipolar disorder, panic attacks and paranoid schizophrenia.  Currently taking fluoxetine and risperidone which more than likely is causing the galactorrhea.  Will obtain prolactin level today and then discuss further management of medications after results have been obtained.   ? ?There are no palpable lumps on manual exam today.  She does note upper quadrant left palpable breast pain between the 12 and 3 o'clock position.  She reports galactorrhea is only present when she manually expresses it. ? ?She endorses right flank pain that is only present when she presses on her right side.  She denies any symptoms of abnormal vaginal discharge, dysuria, hematuria, frequency, urgency pelvic pressure or pain. ? ? ? ? ?Review of Systems  ?Constitutional:  Negative for fever, malaise/fatigue and weight loss.   ?HENT: Negative.  Negative for nosebleeds.   ?Eyes: Negative.  Negative for blurred vision, double vision and photophobia.  ?Respiratory: Negative.  Negative for cough and shortness of breath.   ?Cardiovascular: Negative.  Negative for chest pain, palpitations and leg swelling.  ?Gastrointestinal: Negative.  Negative for heartburn, nausea and vomiting.  ?Genitourinary:  Positive for flank pain. Negative for dysuria, frequency, hematuria and urgency.  ?Musculoskeletal:  Positive for myalgias. Negative for back pain, falls, joint pain and neck pain.  ?     See HPI  ?Neurological: Negative.  Negative for dizziness, focal weakness, seizures and headaches.  ?Psychiatric/Behavioral:  Positive for hallucinations. Negative for suicidal ideas. The patient is nervous/anxious.   ? ?Past Medical History:  ?Diagnosis Date  ? Anxiety and depression   ? Bipolar disorder ([place])   ? Cystitis, interstitial   ? Elevated troponin   ? a. 09/2015: CP/elevated troponin up to 0.11 - unclear significance. CT neg for PE, LHC neg for CAD, normal echo.  ? Microcytic anemia   ? Noted on labs  ? Panic attack   ? Paranoid schizophrenia ([place])   ? Tobacco abuse   ? ? ?Past Surgical History:  ?Procedure Laterality Date  ? CARDIAC CATHETERIZATION N/A 09/29/2015  ? Procedure: Left Heart Cath and Coronary Angiography;  Surgeon: Jettie Booze, MD;  Location: [facility];  Service: Cardiovascular;  Laterality: N/A;  ?  TUBAL LIGATION    ? ? ?Family History  ?Problem Relation Age of Onset  ? Diabetes Father   ? Hypertension Mother   ? Hypertension Sister   ? Lupus Sister   ? Heart disease Maternal Grandfather   ? Lupus Other   ? ? ?Social History Reviewed with no changes to be made today.  ? ?Outpatient Medications Prior to Visit  ?Medication Sig Dispense Refill  ? cloNIDine (CATAPRES) 0.1 MG tablet Take 0.1 mg by mouth at bedtime.    ? FLUoxetine (PROZAC) 40 MG capsule Take 40 mg by mouth daily.    ? hydrochlorothiazide (HYDRODIURIL) 25 MG  tablet TAKE 1 TABLET BY MOUTH DAILY 30 tablet 3  ? MELATONIN PO Take 10 mg by mouth at bedtime.    ? risperidone (RISPERDAL) 4 MG tablet Take 2 mg by mouth See admin instructions. Taking 1 tablet ($RemoveB'4mg'XjzeVjav$ ) in the am and 2 tablets ($RemoveBe'8mg'DWtfOEVUP$ ) at bedtime    ? traZODone (DESYREL) 150 MG tablet Take 150-300 mg by mouth at bedtime as needed.    ? trihexyphenidyl (ARTANE) 2 MG tablet Take 2 mg by mouth daily.    ? trimethoprim (TRIMPEX) 100 MG tablet Take 100 mg by mouth daily.    ? ferrous gluconate (FERGON) 324 MG tablet TAKE 1 TABLET BY MOUTH EVERY MORNING WITH BREAKFAST (Patient not taking: Reported on 12/28/2021) 30 tablet 1  ? phenazopyridine (PYRIDIUM) 200 MG tablet Take 1 tablet (200 mg total) by mouth 3 (three) times daily as needed for pain. (Patient not taking: Reported on 12/28/2021) 15 tablet 0  ? traZODone (DESYREL) 100 MG tablet Take 1 tablet (100 mg total) by mouth at bedtime as needed for sleep. 90 tablet 1  ? benztropine (COGENTIN) 0.5 MG tablet Take 0.5 mg by mouth at bedtime.     ? ?No facility-administered medications prior to visit.  ? ? ?Allergies  ?Allergen Reactions  ? Penicillins Itching and Rash  ? ? ?   ?Objective:  ?  ?BP 134/84   Pulse (!) 57   Wt 188 lb 3.2 oz (85.4 kg)   LMP  (LMP Unknown)   SpO2 98%   BMI 32.30 kg/m?  ?Wt Readings from Last 3 Encounters:  ?12/28/21 188 lb 3.2 oz (85.4 kg)  ?11/15/20 198 lb (89.8 kg)  ?01/26/20 181 lb (82.1 kg)  ? ? ?Physical Exam ?Vitals and nursing note reviewed.  ?Constitutional:   ?   Appearance: She is well-developed.  ?HENT:  ?   Head: Normocephalic and atraumatic.  ?Cardiovascular:  ?   Rate and Rhythm: Normal rate and regular rhythm.  ?   Heart sounds: Normal heart sounds. No murmur heard. ?  No friction rub. No gallop.  ?Pulmonary:  ?   Effort: Pulmonary effort is normal. No tachypnea or respiratory distress.  ?   Breath sounds: Normal breath sounds. No decreased breath sounds, wheezing, rhonchi or rales.  ?Chest:  ?   Chest wall: No mass, lacerations,  deformity, swelling, tenderness, crepitus or edema. There is no dullness to percussion.  ?Breasts: ?   Right: Nipple discharge present.  ?   Left: Nipple discharge present.  ?Abdominal:  ?   General: Bowel sounds are normal.  ?   Palpations: Abdomen is soft.  ?Musculoskeletal:     ?   General: Normal range of motion.  ?   Cervical back: Normal range of motion.  ?   Thoracic back: Bony tenderness present.  ?     Back: ? ?Lymphadenopathy:  ?  Upper Body:  ?   Right upper body: No supraclavicular, axillary or pectoral adenopathy.  ?   Left upper body: No supraclavicular, axillary or pectoral adenopathy.  ?Skin: ?   General: Skin is warm and dry.  ?Neurological:  ?   Mental Status: She is alert and oriented to person, place, and time.  ?   Coordination: Coordination normal.  ?Psychiatric:     ?   Behavior: Behavior normal. Behavior is cooperative.     ?   Thought Content: Thought content normal.     ?   Judgment: Judgment normal.  ? ? ? ? ?   ?Patient has been counseled extensively about nutrition and exercise as well as the importance of adherence with medications and regular follow-up. The patient was given clear instructions to go to ER or return to medical center if symptoms don't improve, worsen or new problems develop. The patient verbalized understanding.  ? ?Follow-up: Return in about 3 months (around 03/30/2022).  ? ?Gildardo Pounds, FNP-BC ?[place] ?[place], [place] ?[phone]   ?12/28/2021, 2:01 PM ?

## 2021-12-28 NOTE — Patient Instructions (Addendum)
Ferrous gluconate 325 /65 mg daily with breakfast ?

## 2021-12-29 LAB — MICROSCOPIC EXAMINATION
Casts: NONE SEEN /lpf
RBC, Urine: NONE SEEN /hpf (ref 0–2)

## 2021-12-29 LAB — CMP14+EGFR
ALT: 19 IU/L (ref 0–32)
AST: 16 IU/L (ref 0–40)
Albumin/Globulin Ratio: 2.3 — ABNORMAL HIGH (ref 1.2–2.2)
Albumin: 4.8 g/dL (ref 3.8–4.8)
Alkaline Phosphatase: 74 IU/L (ref 44–121)
BUN/Creatinine Ratio: 7 — ABNORMAL LOW (ref 9–23)
BUN: 8 mg/dL (ref 6–24)
Bilirubin Total: 0.2 mg/dL (ref 0.0–1.2)
CO2: 24 mmol/L (ref 20–29)
Calcium: 9.9 mg/dL (ref 8.7–10.2)
Chloride: 102 mmol/L (ref 96–106)
Creatinine, Ser: 1.21 mg/dL — ABNORMAL HIGH (ref 0.57–1.00)
Globulin, Total: 2.1 g/dL (ref 1.5–4.5)
Glucose: 108 mg/dL — ABNORMAL HIGH (ref 70–99)
Potassium: 4.1 mmol/L (ref 3.5–5.2)
Sodium: 141 mmol/L (ref 134–144)
Total Protein: 6.9 g/dL (ref 6.0–8.5)
eGFR: 55 mL/min/{1.73_m2} — ABNORMAL LOW (ref >59)

## 2021-12-29 LAB — LIPID PANEL
Chol/HDL Ratio: 5.7 ratio — ABNORMAL HIGH (ref 0.0–4.4)
Cholesterol, Total: 228 mg/dL — ABNORMAL HIGH (ref 100–199)
HDL: 40 mg/dL (ref >39)
LDL Chol Calc (NIH): 169 mg/dL — ABNORMAL HIGH (ref 0–99)
Triglycerides: 104 mg/dL (ref 0–149)
VLDL Cholesterol Cal: 19 mg/dL (ref 5–40)

## 2021-12-29 LAB — CBC
Hematocrit: 41.4 % (ref 34.0–46.6)
Hemoglobin: 13.1 g/dL (ref 11.1–15.9)
MCH: 23.3 pg — ABNORMAL LOW (ref 26.6–33.0)
MCHC: 31.6 g/dL (ref 31.5–35.7)
MCV: 74 fL — ABNORMAL LOW (ref 79–97)
Platelets: 268 10*3/uL (ref 150–450)
RBC: 5.62 x10E6/uL — ABNORMAL HIGH (ref 3.77–5.28)
RDW: 14.6 % (ref 11.7–15.4)
WBC: 4.4 10*3/uL (ref 3.4–10.8)

## 2021-12-29 LAB — PROLACTIN: Prolactin: 155 ng/mL — ABNORMAL HIGH (ref 4.8–23.3)

## 2021-12-29 LAB — URINALYSIS, COMPLETE
Bilirubin, UA: NEGATIVE
Glucose, UA: NEGATIVE
Ketones, UA: NEGATIVE
Nitrite, UA: NEGATIVE
Protein,UA: NEGATIVE
RBC, UA: NEGATIVE
Specific Gravity, UA: 1.01 (ref 1.005–1.030)
Urobilinogen, Ur: 0.2 mg/dL (ref 0.2–1.0)
pH, UA: 6 (ref 5.0–7.5)

## 2021-12-29 LAB — HEMOGLOBIN A1C
Est. average glucose Bld gHb Est-mCnc: 117 mg/dL
Hgb A1c MFr Bld: 5.7 % — ABNORMAL HIGH (ref 4.8–5.6)

## 2022-01-04 ENCOUNTER — Telehealth: Payer: Self-pay | Admitting: Nurse Practitioner

## 2022-01-04 ENCOUNTER — Other Ambulatory Visit: Payer: Self-pay | Admitting: Nurse Practitioner

## 2022-01-04 DIAGNOSIS — N3000 Acute cystitis without hematuria: Secondary | ICD-10-CM

## 2022-01-04 DIAGNOSIS — N643 Galactorrhea not associated with childbirth: Secondary | ICD-10-CM

## 2022-01-04 DIAGNOSIS — R7989 Other specified abnormal findings of blood chemistry: Secondary | ICD-10-CM

## 2022-01-04 MED ORDER — NITROFURANTOIN MONOHYD MACRO 100 MG PO CAPS: 100.0000 mg | ORAL_CAPSULE | Freq: Two times a day (BID) | ORAL | 0 refills | Status: AC

## 2022-01-04 NOTE — Telephone Encounter (Signed)
Called pt sister(legal guardian) and she is ware of appt ?

## 2022-01-12 ENCOUNTER — Ambulatory Visit (HOSPITAL_COMMUNITY)
Admission: RE | Admit: 2022-01-12 | Discharge: 2022-01-12 | Disposition: A | Discharge status: Home / Self Care | Payer: Medicare Other | Source: Ambulatory Visit | Attending: Nurse Practitioner | Admitting: Nurse Practitioner

## 2022-01-12 DIAGNOSIS — R7989 Other specified abnormal findings of blood chemistry: Secondary | ICD-10-CM | POA: Insufficient documentation

## 2022-01-12 DIAGNOSIS — N643 Galactorrhea not associated with childbirth: Secondary | ICD-10-CM | POA: Insufficient documentation

## 2022-01-12 DIAGNOSIS — E221 Hyperprolactinemia: Secondary | ICD-10-CM | POA: Diagnosis not present

## 2022-01-12 MED ORDER — GADOBUTROL 1 MMOL/ML IV SOLN
8.0000 mL | Freq: Once | INTRAVENOUS | Status: AC | PRN
  Administered 2022-01-12: 8 mL via INTRAVENOUS

## 2022-01-16 ENCOUNTER — Ambulatory Visit
Admission: RE | Admit: 2022-01-16 | Discharge: 2022-01-16 | Disposition: A | Discharge status: Home / Self Care | Payer: Medicare Other | Source: Ambulatory Visit | Attending: Nurse Practitioner | Admitting: Nurse Practitioner

## 2022-01-16 DIAGNOSIS — N644 Mastodynia: Secondary | ICD-10-CM

## 2022-01-20 DIAGNOSIS — F251 Schizoaffective disorder, depressive type: Secondary | ICD-10-CM | POA: Diagnosis not present

## 2022-01-30 ENCOUNTER — Encounter: Payer: Self-pay | Admitting: Nurse Practitioner

## 2022-01-30 ENCOUNTER — Other Ambulatory Visit: Payer: Self-pay

## 2022-01-30 ENCOUNTER — Encounter (HOSPITAL_COMMUNITY): Payer: Self-pay

## 2022-01-30 ENCOUNTER — Emergency Department (HOSPITAL_COMMUNITY)
Admission: EM | Admit: 2022-01-30 | Discharge: 2022-01-31 | Disposition: A | Discharge status: Home / Self Care | Payer: Medicare Other | Attending: Emergency Medicine | Admitting: Emergency Medicine

## 2022-01-30 ENCOUNTER — Ambulatory Visit (HOSPITAL_BASED_OUTPATIENT_CLINIC_OR_DEPARTMENT_OTHER): Payer: Medicare Other | Admitting: Nurse Practitioner

## 2022-01-30 VITALS — BP 151/85 | HR 84 | Temp 100.5°F | Resp 20 | Wt 188.0 lb

## 2022-01-30 DIAGNOSIS — U071 COVID-19: Secondary | ICD-10-CM | POA: Insufficient documentation

## 2022-01-30 DIAGNOSIS — R9431 Abnormal electrocardiogram [ECG] [EKG]: Secondary | ICD-10-CM | POA: Diagnosis not present

## 2022-01-30 DIAGNOSIS — T50901A Poisoning by unspecified drugs, medicaments and biological substances, accidental (unintentional), initial encounter: Secondary | ICD-10-CM | POA: Insufficient documentation

## 2022-01-30 DIAGNOSIS — N10 Acute tubulo-interstitial nephritis: Secondary | ICD-10-CM | POA: Diagnosis not present

## 2022-01-30 DIAGNOSIS — E876 Hypokalemia: Secondary | ICD-10-CM | POA: Insufficient documentation

## 2022-01-30 DIAGNOSIS — N390 Urinary tract infection, site not specified: Secondary | ICD-10-CM | POA: Insufficient documentation

## 2022-01-30 LAB — POCT URINALYSIS DIP (CLINITEK)
Bilirubin, UA: NEGATIVE
Blood, UA: NEGATIVE
Glucose, UA: NEGATIVE mg/dL
Ketones, POC UA: NEGATIVE mg/dL
Nitrite, UA: NEGATIVE
POC PROTEIN,UA: NEGATIVE
Spec Grav, UA: 1.01 (ref 1.010–1.025)
Urobilinogen, UA: 0.2 E.U./dL
pH, UA: 6 (ref 5.0–8.0)

## 2022-01-30 MED ORDER — SULFAMETHOXAZOLE-TRIMETHOPRIM 800-160 MG PO TABS
1.0000 | ORAL_TABLET | Freq: Two times a day (BID) | ORAL | 0 refills | Status: AC
  Filled 2022-01-30: qty 28, 14d supply, fill #0

## 2022-01-30 NOTE — Progress Notes (Signed)
? ?Assessment & Plan:  ?Jody [name] was seen today for urinary tract infection. ? ?Diagnoses and all orders for this visit: ? ?Acute pyelonephritis ?-     Urinalysis, Complete ?-     Urine Culture ?-     sulfamethoxazole-trimethoprim (BACTRIM DS) 800-160 MG tablet; Take 1 tablet by mouth 2 (two) times daily for 14 days. ?-     CBC with Differential ?-     POCT URINALYSIS DIP (CLINITEK) ? ? ? ?Patient has been counseled on age-appropriate routine health concerns for screening and prevention. These are reviewed and up-to-date. Referrals have been placed accordingly. Immunizations are up-to-date or declined.    ?Subjective:  ? ?Chief Complaint  ?Patient presents with  ? Urinary Tract Infection  ? ?HPI ?Jody [name] 51 y.o. female presents to office today with symptoms of UTI. She was prescribed macrobid several weeks ago for UTI. She has difficulty recalling events and is unsure if she took the macrobid or not.  ?Today she endorses chills, fever, sore throat, headache, dysuria and flank pain. Onset of symptoms 3 days ago.  ? ? ? ?Review of Systems  ?Constitutional:  Positive for chills and fever. Negative for malaise/fatigue and weight loss.  ?HENT:  Positive for sore throat. Negative for nosebleeds.   ?Eyes: Negative.  Negative for blurred vision, double vision and photophobia.  ?Respiratory: Negative.  Negative for cough and shortness of breath.   ?Cardiovascular: Negative.  Negative for chest pain, palpitations and leg swelling.  ?Gastrointestinal: Negative.  Negative for heartburn, nausea and vomiting.  ?Genitourinary:  Positive for dysuria and flank pain. Negative for frequency, hematuria and urgency.  ?Musculoskeletal:  Negative for myalgias.  ?Neurological:  Positive for headaches. Negative for dizziness, focal weakness and seizures.  ?Psychiatric/Behavioral: Negative.  Negative for suicidal ideas.   ? ?Past Medical History:  ?Diagnosis Date  ? Anxiety and depression   ? Bipolar disorder (HCC)   ? Cystitis,  interstitial   ? Elevated troponin   ? a. 09/2015: CP/elevated troponin up to 0.11 - unclear significance. CT neg for PE, LHC neg for CAD, normal echo.  ? Microcytic anemia   ? Noted on labs  ? Panic attack   ? Paranoid schizophrenia (HCC)   ? Tobacco abuse   ? ? ?Past Surgical History:  ?Procedure Laterality Date  ? CARDIAC CATHETERIZATION N/A 09/29/2015  ? Procedure: Left Heart Cath and Coronary Angiography;  Surgeon: Corky Crafts, MD;  Location: [facility] INVASIVE CV LAB;  Service: Cardiovascular;  Laterality: N/A;  ? TUBAL LIGATION    ? ? ?Family History  ?Problem Relation Age of Onset  ? Diabetes Father   ? Hypertension Mother   ? Hypertension Sister   ? Lupus Sister   ? Heart disease Maternal Grandfather   ? Lupus Other   ? ? ?Social History Reviewed with no changes to be made today.  ? ?Outpatient Medications Prior to Visit  ?Medication Sig Dispense Refill  ? cloNIDine (CATAPRES) 0.1 MG tablet Take 0.1 mg by mouth at bedtime.    ? ferrous gluconate (FERGON) 324 MG tablet TAKE 1 TABLET BY MOUTH EVERY MORNING WITH BREAKFAST 30 tablet 1  ? FLUoxetine (PROZAC) 40 MG capsule Take 40 mg by mouth daily.    ? hydrochlorothiazide (HYDRODIURIL) 25 MG tablet TAKE 1 TABLET BY MOUTH DAILY 30 tablet 3  ? MELATONIN PO Take 10 mg by mouth at bedtime.    ? risperidone (RISPERDAL) 4 MG tablet Take 2 mg by mouth See admin instructions. Taking 1  tablet (4mg ) in the am and 2 tablets (8mg ) at bedtime    ? tiZANidine (ZANAFLEX) 4 MG tablet Take 1 tablet (4 mg total) by mouth every 6 (six) hours as needed for muscle spasms. 60 tablet 1  ? traZODone (DESYREL) 150 MG tablet Take 150-300 mg by mouth at bedtime as needed.    ? trihexyphenidyl (ARTANE) 2 MG tablet Take 2 mg by mouth daily.    ? trimethoprim (TRIMPEX) 100 MG tablet Take 100 mg by mouth daily.    ? traZODone (DESYREL) 100 MG tablet Take 1 tablet (100 mg total) by mouth at bedtime as needed for sleep. 90 tablet 1  ? phenazopyridine (PYRIDIUM) 200 MG tablet Take 1 tablet  (200 mg total) by mouth 3 (three) times daily as needed for pain. (Patient not taking: Reported on 12/28/2021) 15 tablet 0  ? ?No facility-administered medications prior to visit.  ? ? ?Allergies  ?Allergen Reactions  ? Penicillins Itching and Rash  ? ? ?   ?Objective:  ?  ?BP (!) 151/85 (BP Location: Left Arm, Patient Position: Sitting, Cuff Size: Normal)   Pulse 84   Temp (!) 100.5 ?F (38.1 ?C) (Oral)   Resp 20   Wt 188 lb (85.3 kg)   LMP  (LMP Unknown)   SpO2 100%   BMI 32.27 kg/m?  ?Wt Readings from Last 3 Encounters:  ?01/30/22 188 lb (85.3 kg)  ?12/28/21 188 lb 3.2 oz (85.4 kg)  ?11/15/20 198 lb (89.8 kg)  ? ? ?Physical Exam ?Vitals and nursing note reviewed.  ?Constitutional:   ?   Appearance: She is well-developed.  ?HENT:  ?   Head: Normocephalic and atraumatic.  ?   Mouth/Throat:  ?   Pharynx: No pharyngeal swelling, oropharyngeal exudate, posterior oropharyngeal erythema or uvula swelling.  ?Cardiovascular:  ?   Rate and Rhythm: Normal rate and regular rhythm.  ?   Heart sounds: Normal heart sounds. No murmur heard. ?  No friction rub. No gallop.  ?Pulmonary:  ?   Effort: Pulmonary effort is normal. No tachypnea or respiratory distress.  ?   Breath sounds: Normal breath sounds. No decreased breath sounds, wheezing, rhonchi or rales.  ?Chest:  ?   Chest wall: No tenderness.  ?Abdominal:  ?   General: Bowel sounds are normal.  ?   Palpations: Abdomen is soft.  ?   Tenderness: There is abdominal tenderness in the suprapubic area. There is no right CVA tenderness or left CVA tenderness.  ?Musculoskeletal:     ?   General: Normal range of motion.  ?   Cervical back: Normal range of motion.  ?Skin: ?   General: Skin is warm and dry.  ?Neurological:  ?   Mental Status: She is alert and oriented to person, place, and time.  ?   Coordination: Coordination normal.  ?Psychiatric:     ?   Behavior: Behavior normal. Behavior is cooperative.     ?   Thought Content: Thought content normal.     ?   Judgment:  Judgment normal.  ? ? ? ? ?   ?Patient has been counseled extensively about nutrition and exercise as well as the importance of adherence with medications and regular follow-up. The patient was given clear instructions to go to ER or return to medical center if symptoms don't improve, worsen or new problems develop. The patient verbalized understanding.  ? ?Follow-up: Return if symptoms worsen or fail to improve.  ? ?Gildardo Pounds, FNP-BC ?[facility] ?[place], [place] ?[phone]   ?01/30/2022, 8:15 PM ?

## 2022-01-30 NOTE — ED Triage Notes (Addendum)
Pt took a whole bottle of Nyquil around 7 pm in an attempt to feel better. Pt's sister states that she is also taking medications for her psych issues. Pt is awake and talking but states that she feels tired. Pt reports having a sore throat and congestion since getting back from a cruise.  ?

## 2022-01-30 NOTE — Progress Notes (Signed)
Generalized body aches ?Chills and headache ?Taking tylenol ? ?Sx's x 3 days ?

## 2022-01-31 DIAGNOSIS — T50901A Poisoning by unspecified drugs, medicaments and biological substances, accidental (unintentional), initial encounter: Secondary | ICD-10-CM | POA: Diagnosis not present

## 2022-01-31 LAB — URINALYSIS, COMPLETE
Bilirubin, UA: NEGATIVE
Glucose, UA: NEGATIVE
Ketones, UA: NEGATIVE
Nitrite, UA: NEGATIVE
Protein,UA: NEGATIVE
RBC, UA: NEGATIVE
Specific Gravity, UA: 1.009 (ref 1.005–1.030)
Urobilinogen, Ur: 0.2 mg/dL (ref 0.2–1.0)
pH, UA: 6.5 (ref 5.0–7.5)

## 2022-01-31 LAB — COMPREHENSIVE METABOLIC PANEL
ALT: 16 U/L (ref 0–44)
AST: 18 U/L (ref 15–41)
Albumin: 4.6 g/dL (ref 3.5–5.0)
Alkaline Phosphatase: 54 U/L (ref 38–126)
Anion gap: 8 (ref 5–15)
BUN: 7 mg/dL (ref 6–20)
CO2: 21 mmol/L — ABNORMAL LOW (ref 22–32)
Calcium: 9.2 mg/dL (ref 8.9–10.3)
Chloride: 106 mmol/L (ref 98–111)
Creatinine, Ser: 0.97 mg/dL (ref 0.44–1.00)
GFR, Estimated: >60 mL/min (ref >60)
Glucose, Bld: 98 mg/dL (ref 70–99)
Potassium: 3 mmol/L — ABNORMAL LOW (ref 3.5–5.1)
Sodium: 135 mmol/L (ref 135–145)
Total Bilirubin: 0.9 mg/dL (ref 0.3–1.2)
Total Protein: 7.5 g/dL (ref 6.5–8.1)

## 2022-01-31 LAB — CBC WITH DIFFERENTIAL/PLATELET
Abs Immature Granulocytes: 0.02 10*3/uL (ref 0.00–0.07)
Basophils Absolute: 0 10*3/uL (ref 0.0–0.1)
Basophils Absolute: 0 10*3/uL (ref 0.0–0.2)
Basophils Relative: 0 %
Basos: 0 %
EOS (ABSOLUTE): 0 10*3/uL (ref 0.0–0.4)
Eos: 0 %
Eosinophils Absolute: 0 10*3/uL (ref 0.0–0.5)
Eosinophils Relative: 0 %
HCT: 39.8 % (ref 36.0–46.0)
Hematocrit: 39.6 % (ref 34.0–46.6)
Hemoglobin: 12.8 g/dL (ref 11.1–15.9)
Hemoglobin: 12.8 g/dL (ref 12.0–15.0)
Immature Grans (Abs): 0 10*3/uL (ref 0.0–0.1)
Immature Granulocytes: 0 %
Immature Granulocytes: 1 %
Lymphocytes Absolute: 1.3 10*3/uL (ref 0.7–3.1)
Lymphocytes Relative: 27 %
Lymphs Abs: 2 10*3/uL (ref 0.7–4.0)
Lymphs: 15 %
MCH: 23.8 pg — ABNORMAL LOW (ref 26.6–33.0)
MCH: 24 pg — ABNORMAL LOW (ref 26.0–34.0)
MCHC: 32.2 g/dL (ref 30.0–36.0)
MCHC: 32.3 g/dL (ref 31.5–35.7)
MCV: 74 fL — ABNORMAL LOW (ref 79–97)
MCV: 74.5 fL — ABNORMAL LOW (ref 80.0–100.0)
Monocytes Absolute: 0.5 10*3/uL (ref 0.1–1.0)
Monocytes Absolute: 0.6 10*3/uL (ref 0.1–0.9)
Monocytes Relative: 7 %
Monocytes: 6 %
Neutro Abs: 5 10*3/uL (ref 1.7–7.7)
Neutrophils Absolute: 6.9 10*3/uL (ref 1.4–7.0)
Neutrophils Relative %: 66 %
Neutrophils: 78 %
Platelets: 284 10*3/uL (ref 150–400)
Platelets: 302 10*3/uL (ref 150–450)
RBC: 5.34 MIL/uL — ABNORMAL HIGH (ref 3.87–5.11)
RBC: 5.38 x10E6/uL — ABNORMAL HIGH (ref 3.77–5.28)
RDW: 14.7 % (ref 11.5–15.5)
RDW: 15 % (ref 11.7–15.4)
WBC: 7.6 10*3/uL (ref 4.0–10.5)
WBC: 8.9 10*3/uL (ref 3.4–10.8)
nRBC: 0 % (ref 0.0–0.2)

## 2022-01-31 LAB — BASIC METABOLIC PANEL
Anion gap: 8 (ref 5–15)
BUN: 7 mg/dL (ref 6–20)
CO2: 25 mmol/L (ref 22–32)
Calcium: 9.3 mg/dL (ref 8.9–10.3)
Chloride: 106 mmol/L (ref 98–111)
Creatinine, Ser: 1.08 mg/dL — ABNORMAL HIGH (ref 0.44–1.00)
GFR, Estimated: >60 mL/min (ref >60)
Glucose, Bld: 114 mg/dL — ABNORMAL HIGH (ref 70–99)
Potassium: 3.2 mmol/L — ABNORMAL LOW (ref 3.5–5.1)
Sodium: 139 mmol/L (ref 135–145)

## 2022-01-31 LAB — RAPID URINE DRUG SCREEN, HOSP PERFORMED
Amphetamines: POSITIVE — AB
Barbiturates: NOT DETECTED
Benzodiazepines: NOT DETECTED
Cocaine: NOT DETECTED
Opiates: NOT DETECTED
Tetrahydrocannabinol: NOT DETECTED

## 2022-01-31 LAB — MICROSCOPIC EXAMINATION
Bacteria, UA: NONE SEEN
Casts: NONE SEEN /lpf
RBC, Urine: NONE SEEN /hpf (ref 0–2)

## 2022-01-31 LAB — RESP PANEL BY RT-PCR (FLU A&B, COVID) ARPGX2
Influenza A by PCR: NEGATIVE
Influenza B by PCR: NEGATIVE
SARS Coronavirus 2 by RT PCR: POSITIVE — AB

## 2022-01-31 LAB — ETHANOL: Alcohol, Ethyl (B): <10 mg/dL (ref <10)

## 2022-01-31 LAB — SALICYLATE LEVEL: Salicylate Lvl: <7 mg/dL — ABNORMAL LOW (ref 7.0–30.0)

## 2022-01-31 LAB — MAGNESIUM: Magnesium: 2.1 mg/dL (ref 1.7–2.4)

## 2022-01-31 LAB — ACETAMINOPHEN LEVEL
Acetaminophen (Tylenol), Serum: 16 ug/mL (ref 10–30)
Acetaminophen (Tylenol), Serum: 34 ug/mL — ABNORMAL HIGH (ref 10–30)

## 2022-01-31 MED ORDER — POTASSIUM CHLORIDE CRYS ER 20 MEQ PO TBCR
40.0000 meq | EXTENDED_RELEASE_TABLET | Freq: Once | ORAL | Status: AC
  Administered 2022-01-31: 40 meq via ORAL
  Filled 2022-01-31: qty 2

## 2022-01-31 MED ORDER — SODIUM CHLORIDE 0.9 % IV BOLUS: 1000.0000 mL | Freq: Once | INTRAVENOUS | Status: DC

## 2022-01-31 MED ORDER — LACTATED RINGERS IV BOLUS
1000.0000 mL | Freq: Once | INTRAVENOUS | Status: AC
  Administered 2022-01-31: 1000 mL via INTRAVENOUS

## 2022-01-31 NOTE — ED Provider Notes (Signed)
?[facility]-EMERGENCY DEPT ?Provider Note ? ? ?CSN: [id number] ?Arrival date & time: 01/30/22  2302 ? ?  ? ?History ? ?Chief Complaint  ?Patient presents with  ? Drug Overdose  ? ? ?Jody [name] is a 51 y.o. female. ? ?The history is provided by the patient.  ?Drug Overdose ?This is a new problem. Pertinent negatives include no chest pain and no abdominal pain. Nothing aggravates the symptoms. Nothing relieves the symptoms.  ?Patient with history of bipolar presents with accidental overdose.  She reports she has recently had sore throat and congestion and to help her symptoms she drank a whole bottle of NyQuil around 7 PM on May 1.  No vomiting.  No new abdominal pain.  She took her nighttime medications as prescribed.  She denies suicide attempt. ?No other acute complaints ?  ? ?Home Medications ?Prior to Admission medications   ?Medication Sig Start Date End Date Taking? Authorizing Provider  ?cloNIDine (CATAPRES) 0.1 MG tablet Take 0.1 mg by mouth at bedtime. 12/16/21  Yes [provider]  ?ferrous gluconate (FERGON) 324 MG tablet TAKE 1 TABLET BY MOUTH EVERY MORNING WITH BREAKFAST 12/19/21  Yes Hoy RegisterNewlin, Enobong, MD  ?FLUoxetine (PROZAC) 40 MG capsule Take 40 mg by mouth daily. 12/16/21  Yes [provider]  ?hydrochlorothiazide (HYDRODIURIL) 25 MG tablet TAKE 1 TABLET BY MOUTH DAILY ?Patient taking differently: Take 25 mg by mouth daily. 11/24/21  Yes Hoy RegisterNewlin, Enobong, MD  ?MELATONIN PO Take 10 mg by mouth at bedtime.   Yes [provider]  ?risperidone (RISPERDAL) 4 MG tablet Take 2 mg by mouth See admin instructions. Taking 1 tablet (4mg ) in the am and 2 tablets (8mg ) at bedtime 01/16/17  Yes [provider]  ?sulfamethoxazole-trimethoprim (BACTRIM DS) 800-160 MG tablet Take 1 tablet by mouth 2 (two) times daily for 14 days. 01/30/22 02/13/22 Yes Claiborne RiggFleming, [name], NP  ?tiZANidine (ZANAFLEX) 4 MG tablet Take 1 tablet (4 mg total) by mouth every 6 (six) hours as  needed for muscle spasms. 12/28/21  Yes Claiborne RiggFleming, [name], NP  ?traZODone (DESYREL) 150 MG tablet Take 150-300 mg by mouth at bedtime as needed for sleep. 12/16/21  Yes [provider]  ?trihexyphenidyl (ARTANE) 2 MG tablet Take 2 mg by mouth daily. 12/16/21  Yes [provider]  ?ziprasidone (GEODON) 20 MG capsule Take 20 mg by mouth daily. 01/20/22  Yes [provider]  ?   ? ?Allergies    ?Penicillins   ? ?Review of Systems   ?Review of Systems  ?Constitutional:  Negative for fever.  ?HENT:  Positive for congestion.   ?Cardiovascular:  Negative for chest pain.  ?Gastrointestinal:  Negative for abdominal pain and vomiting.  ?Psychiatric/Behavioral:  Negative for suicidal ideas.   ? ?Physical Exam ?Updated Vital Signs ?BP (!) 132/94 (BP Location: Left Arm)   Pulse 91   Temp 98.6 ?F (37 ?C) (Oral)   Resp 16   LMP  (LMP Unknown)   SpO2 94%  ?Physical Exam ?CONSTITUTIONAL: Well developed ?HEAD: Normocephalic/atraumatic ?EYES: EOMI/PERRL ?ENMT: Mucous membranes moist ?NECK: supple no meningeal signs ?CV: S1/S2 noted, no murmurs/rubs/gallops noted ?LUNGS: Lungs are clear to auscultation bilaterally, no apparent distress ?ABDOMEN: soft, nontender ?NEURO: Pt is awake/alert/appropriate, moves all extremitiesx4.  No facial droop.   ?EXTREMITIES: pulses normal/equal, full ROM ?SKIN: warm, color normal ?PSYCH: Flat affect ? ?ED Results / Procedures / Treatments   ?Labs ?(all labs ordered are listed, but only abnormal results are displayed) ?Labs Reviewed  ?RESP  PANEL BY RT-PCR (FLU A&B, COVID) ARPGX2 - Abnormal; Notable for the following components:  ?    Result Value  ? SARS Coronavirus 2 by RT PCR POSITIVE (*)   ? All other components within normal limits  ?CBC WITH DIFFERENTIAL/PLATELET - Abnormal; Notable for the following components:  ? RBC 5.34 (*)   ? MCV 74.5 (*)   ? MCH 24.0 (*)   ? All other components within normal limits  ?COMPREHENSIVE METABOLIC PANEL - Abnormal; Notable for the  following components:  ? Potassium 3.0 (*)   ? CO2 21 (*)   ? All other components within normal limits  ?SALICYLATE LEVEL - Abnormal; Notable for the following components:  ? Salicylate Lvl <7.0 (*)   ? All other components within normal limits  ?ACETAMINOPHEN LEVEL - Abnormal; Notable for the following components:  ? Acetaminophen (Tylenol), Serum 34 (*)   ? All other components within normal limits  ?RAPID URINE DRUG SCREEN, HOSP PERFORMED - Abnormal; Notable for the following components:  ? Amphetamines POSITIVE (*)   ? All other components within normal limits  ?BASIC METABOLIC PANEL - Abnormal; Notable for the following components:  ? Potassium 3.2 (*)   ? Glucose, Bld 114 (*)   ? Creatinine, Ser 1.08 (*)   ? All other components within normal limits  ?ETHANOL  ?MAGNESIUM  ?ACETAMINOPHEN LEVEL  ?ACETAMINOPHEN LEVEL  ?BASIC METABOLIC PANEL  ? ? ?EKG ?EKG Interpretation ? ?Date/Time:  Monday Jan 30 2022 23:54:56 EDT ?Ventricular Rate:  86 ?PR Interval:  149 ?QRS Duration: 79 ?QT Interval:  425 ?QTC Calculation: 509 ?R Axis:   67 ?Text Interpretation: Sinus rhythm Minimal ST elevation, anterolateral leads Borderline prolonged QT interval Confirmed by Zadie Rhine ([id number]) on 01/31/2022 12:00:49 AM ? ?Radiology ?No results found. ? ?Procedures ?Procedures  ? ? ?Medications Ordered in ED ?Medications  ?potassium chloride SA (KLOR-CON M) CR tablet 40 mEq (40 mEq Oral Given 01/31/22 0437)  ?lactated ringers bolus 1,000 mL (1,000 mLs Intravenous New Bag/Given 01/31/22 0437)  ? ? ?ED Course/ Medical Decision Making/ A&P ?Clinical Course as of 01/31/22 0617  ?Tue Jan 31, 2022  ?0058 The initial Tylenol level is around a 4-hour level that is not within treatment parameters.  We will recheck level at 3:45 AM.  We will continue to monitor.  Patient denies suicide attempt [DW]  ?0158 Patient stable, no acute distress resting comfortably [DW]  ?0344 Patient was noted to have a fever, was found to have COVID-19 and recently  treated for pyelonephritis [DW]  ?0501 Patient improving.  She was informed of her diagnosis of COVID-19.  Patient now reports she first took half the bottle and then 3 hours later took the other half around 7 PM.  Fortunately her Tylenol level is decreasing.  No signs of APAP toxicity [DW]  ?0501 Patient adamantly denies any SI.  She denies any hallucinations.  She is awake and alert and seems appropriate.  She reports medication compliance otherwise. [DW]  ?24 I spoke to her sister via phone.  She does not suspect this was a suicide attempt. [DW]  ?0938 Per poison center recommendations, plan to give IV fluids, recheck EKG and likely discharge. [DW]  ?0502 Patient advised not to take anymore NyQuil [DW]  ?  ?Clinical Course User Index ?[DW] Zadie Rhine, MD  ? ?                        ?Medical Decision Making ?Amount and/or  Complexity of Data Reviewed ?Labs: ordered. ?ECG/medicine tests: ordered. ? ?Risk ?Prescription drug management. ? ? ?This patient presents to the ED for concern of overdose, this involves an extensive number of treatment options, and is a complaint that carries with it a high risk of complications and morbidity.  The differential diagnosis includes but is not limited to Tylenol overdose, salicylate overdose ? ?Comorbidities that complicate the patient evaluation: ?Patient?s presentation is complicated by their history of bipolar disorder ? ? ?Additional history obtained: ?Additional history obtained from family ?Records reviewed Primary Care Documents ? ?Lab Tests: ?I Ordered, and personally interpreted labs.  The pertinent results include: Mild hypokalemia, mildly elevated Tylenol ? ?Cardiac Monitoring: ?The patient was maintained on a cardiac monitor.  I personally viewed and interpreted the cardiac monitor which showed an underlying rhythm of:  sinus rhythm ? ?Medicines ordered and prescription drug management: ?I ordered medication including IV fluids for possible  dehydration ?Reevaluation of the patient after these medicines showed that the patient    improved ? ?Consultations Obtained: ?I requested consultation with the consultant Poison control , and discussed  findings as well as pertinent pl

## 2022-01-31 NOTE — ED Notes (Addendum)
Poison control notified.  ?Tylenol level if greater than 126 -treat. Repeat EKG in 2-4 hrs. Hydration recommended. Observation time 6 hrs.  ? ?

## 2022-02-01 LAB — URINE CULTURE

## 2022-02-15 ENCOUNTER — Telehealth: Payer: Self-pay | Admitting: Nurse Practitioner

## 2022-02-15 NOTE — Telephone Encounter (Signed)
Patient checking on the status of MRI results ?

## 2022-02-15 NOTE — Telephone Encounter (Signed)
Copied from CRM [id number]. Topic: General - Other ?>> Feb 15, 2022 12:37 PM Traci Sermon wrote: ?Reason for CRM: Pt called in stating she is still waiting to get her MRI results back that she got done in April, please advise. ?

## 2022-02-15 NOTE — Telephone Encounter (Signed)
Called pt legal guardian, Ms Delpino ?She is aware of result and DOB was confrimed ?

## 2022-02-21 DIAGNOSIS — F251 Schizoaffective disorder, depressive type: Secondary | ICD-10-CM | POA: Diagnosis not present

## 2022-03-13 ENCOUNTER — Other Ambulatory Visit: Payer: Self-pay | Admitting: Family Medicine

## 2022-03-13 DIAGNOSIS — I10 Essential (primary) hypertension: Secondary | ICD-10-CM

## 2022-03-13 DIAGNOSIS — Z20822 Contact with and (suspected) exposure to covid-19: Secondary | ICD-10-CM | POA: Diagnosis not present

## 2022-03-14 NOTE — Telephone Encounter (Signed)
Requested Prescriptions  Pending Prescriptions Disp Refills  . hydrochlorothiazide (HYDRODIURIL) 25 MG tablet [Pharmacy Med Name: hydroCHLOROthiazide 25MG  TABS*] 28 tablet 1    Sig: TAKE 1 TABLET BY MOUTH DAILY     Cardiovascular: Diuretics - Thiazide Failed - 03/13/2022 12:12 PM      Failed - Cr in normal range and within 180 days    Creat  Date Value Ref Range Status  11/01/2016 0.89 0.50 - 1.10 mg/dL Final   Creatinine, Ser  Date Value Ref Range Status  01/31/2022 1.08 (H) 0.44 - 1.00 mg/dL Final   Creatinine, POC  Date Value Ref Range Status  02/01/2017 50 mg/dL Final         Failed - K in normal range and within 180 days    Potassium  Date Value Ref Range Status  01/31/2022 3.2 (L) 3.5 - 5.1 mmol/L Final         Failed - Last BP in normal range    BP Readings from Last 1 Encounters:  01/31/22 (!) 152/94         Passed - Na in normal range and within 180 days    Sodium  Date Value Ref Range Status  01/31/2022 139 135 - 145 mmol/L Final  12/28/2021 141 134 - 144 mmol/L Final         Passed - Valid encounter within last 6 months    Recent Outpatient Visits          1 month ago Acute pyelonephritis   [facility] [place], [place], NP   2 months ago Pain of left breast   [facility], NP   9 months ago Burning with urination   [facility] [place], [place], NP   1 year ago Essential hypertension   [facility] [place], [place], NP   2 years ago Vaginal itching   [facility] [place], [place], NP

## 2022-03-15 ENCOUNTER — Other Ambulatory Visit: Payer: Self-pay | Admitting: Family Medicine

## 2022-03-15 NOTE — Telephone Encounter (Signed)
Requested medication (s) are due for refill today:   Yes  Requested medication (s) are on the active medication list:   Yes  Future visit scheduled:   No   Seen a mo. ago   Last ordered: 12/19/2021 #30, 1 refill  Returned because labs are due per protocol   Requested Prescriptions  Pending Prescriptions Disp Refills   ferrous gluconate (FERGON) 324 MG tablet [Pharmacy Med Name: Ferrous Gluconate 324 (38 Fe)MG TABS] 28 tablet     Sig: TAKE 1 TABLET BY MOUTH EVERY MORNING WITH BREAKFAST     Endocrinology:  Minerals - Iron Supplementation Failed - 03/15/2022 11:50 AM      Failed - RBC in normal range and within 360 days    RBC  Date Value Ref Range Status  01/30/2022 5.34 (H) 3.87 - 5.11 MIL/uL Final         Failed - Fe (serum) in normal range and within 360 days    Iron  Date Value Ref Range Status  06/10/2018 91 27 - 159 ug/dL Final   Iron Saturation  Date Value Ref Range Status  06/10/2018 30 15 - 55 % Final         Failed - Ferritin in normal range and within 360 days    Ferritin  Date Value Ref Range Status  06/10/2018 66 15 - 150 ng/mL Final         Passed - HGB in normal range and within 360 days    Hemoglobin  Date Value Ref Range Status  01/30/2022 12.8 12.0 - 15.0 g/dL Final  85/11/7739 28.7 11.1 - 15.9 g/dL Final         Passed - HCT in normal range and within 360 days    HCT  Date Value Ref Range Status  01/30/2022 39.8 36.0 - 46.0 % Final   Hematocrit  Date Value Ref Range Status  01/30/2022 39.6 34.0 - 46.6 % Final         Passed - Valid encounter within last 12 months    Recent Outpatient Visits           1 month ago Acute pyelonephritis   [facility] [place], Shea Stakes, NP   2 months ago Pain of left breast   [facility] Claiborne Rigg, NP   9 months ago Burning with urination   [facility] [place], Shea Stakes, NP   1 year ago Essential hypertension    [facility] [place], Shea Stakes, NP   2 years ago Vaginal itching   [facility] [place], [place], NP

## 2022-03-16 DIAGNOSIS — F251 Schizoaffective disorder, depressive type: Secondary | ICD-10-CM | POA: Diagnosis not present

## 2022-03-27 ENCOUNTER — Encounter (HOSPITAL_COMMUNITY): Payer: Self-pay | Admitting: Registered Nurse

## 2022-03-27 ENCOUNTER — Ambulatory Visit (HOSPITAL_COMMUNITY)
Admission: EM | Admit: 2022-03-27 | Discharge: 2022-03-27 | Disposition: A | Discharge status: Home / Self Care | Payer: Medicare Other | Attending: Registered Nurse | Admitting: Registered Nurse

## 2022-03-27 DIAGNOSIS — Z79899 Other long term (current) drug therapy: Secondary | ICD-10-CM | POA: Insufficient documentation

## 2022-03-27 DIAGNOSIS — F200 Paranoid schizophrenia: Secondary | ICD-10-CM | POA: Diagnosis not present

## 2022-03-27 DIAGNOSIS — F5105 Insomnia due to other mental disorder: Secondary | ICD-10-CM | POA: Insufficient documentation

## 2022-03-27 DIAGNOSIS — G4700 Insomnia, unspecified: Secondary | ICD-10-CM | POA: Diagnosis present

## 2022-03-27 MED ORDER — LORAZEPAM 1 MG PO TABS
2.0000 mg | ORAL_TABLET | Freq: Once | ORAL | Status: AC
  Administered 2022-03-27: 2 mg via ORAL
  Filled 2022-03-27: qty 2

## 2022-03-29 DIAGNOSIS — F251 Schizoaffective disorder, depressive type: Secondary | ICD-10-CM | POA: Diagnosis not present

## 2022-04-06 ENCOUNTER — Encounter (HOSPITAL_COMMUNITY): Payer: Self-pay

## 2022-04-06 ENCOUNTER — Ambulatory Visit: Payer: Self-pay

## 2022-04-06 ENCOUNTER — Ambulatory Visit (HOSPITAL_COMMUNITY)
Admission: RE | Admit: 2022-04-06 | Discharge: 2022-04-06 | Disposition: A | Discharge status: Home / Self Care | Payer: Medicare Other | Source: Ambulatory Visit | Attending: Student | Admitting: Student

## 2022-04-06 VITALS — BP 105/58 | HR 92 | Temp 98.8°F | Resp 18

## 2022-04-06 DIAGNOSIS — G4700 Insomnia, unspecified: Secondary | ICD-10-CM | POA: Diagnosis not present

## 2022-04-06 MED ORDER — TRAZODONE HCL 50 MG PO TABS: 50.0000 mg | ORAL_TABLET | Freq: Every day | ORAL | 1 refills | Status: DC

## 2022-04-06 NOTE — Telephone Encounter (Signed)
Noted pt has appt at 2

## 2022-04-06 NOTE — Discharge Instructions (Addendum)
-  Trazodone at bedtime. Make sure to adhere to dosage instructions. Taking too much of this medication can result in a rare but serious complication called serotonin syndrome, which can cause seizures and other emergent issues.  -Hydroxyzine as needed for anxiety and insomnia, up to every 6 hours. This medication will make you drowsy, so avoid before driving or operating machinery. Do not drink alcohol while taking this medication.  -Avoid other antihistamines that will make you drowsy while taking this medication, like benedryl. -If your symptoms persist despite treatment - please head back to the behavioral health urgent care! They are specialized in treating issues like this.  -Continue your daily medications as directed.  -If you ever feel like hurting yourself or others, call 911 or go to the ED. If you still cannot sleep, DO NOT take more than the recommended dosage of your medications. This could hurt you.

## 2022-04-06 NOTE — ED Provider Notes (Signed)
[facility]    CSN: [id number] Arrival date & time: 04/06/22  1406      History   Chief Complaint Chief Complaint  Patient presents with   Insomnia    HPI Jody [name] is a 51 y.o. female presenting for insomnia. History paranoid schizophrenia. Last saw behavioral health urgent care for this on 03/27/22, they administered ativan during visit with temporary improvement. During her visit 6/26, they noted "she has outpatient psychiatric services at [facility] and was last seen on Friday 03/24/22 and had medication changes related to auditory hallucinations.  Patient states that her Risperdal was discontinued and she was started on Geodon 20 mg and Remeron was increased to 15 mg.  Patient is also taking Seroquel 100 mg Q hs, Prozac 20 qd, Cogentin 0.1 Q hs, Vistaril 25 mg one tablet Bid during the day and 50 mg Q hs prn, Trazodone 150 mg Q hs." today, the patient is unsure of her current medications, though she did bring a blister pack that indicates she is only currently taking hydroxyzine and Remeron for insomnia.  Also takes daily Prozac.  She states today she has not slept for 4 days.  Has attempted several over-the-counter sleep medications without relief.  Denies illicit substance use.  She is feeling well otherwise, without chest pain, shortness of breath, palpitations.  Apparently her primary care advised her to follow-up with [place] for this.  She is specifically requesting Ativan.  HPI  Past Medical History:  Diagnosis Date   Anxiety and depression    Bipolar disorder (HCC)    Cystitis, interstitial    Elevated troponin    a. 09/2015: CP/elevated troponin up to 0.11 - unclear significance. CT neg for PE, LHC neg for CAD, normal echo.   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      Home Medications    Prior to Admission medications   Medication Sig Start Date End Date Taking? Authorizing Provider  traZODone (DESYREL) 50 MG tablet Take 1 tablet (50 mg total) by mouth at bedtime. 04/06/22  Yes Rhys Martini, PA-C  cloNIDine (CATAPRES) 0.1 MG tablet Take 0.1 mg by mouth at bedtime. 12/16/21   [provider]  ferrous gluconate (FERGON) 324 MG tablet TAKE 1 TABLET BY MOUTH EVERY MORNING WITH BREAKFAST 03/15/22   Hoy Register, MD  FLUoxetine (PROZAC) 40 MG capsule Take 40 mg by mouth daily. 12/16/21   [provider]  hydrochlorothiazide (HYDRODIURIL) 25 MG tablet TAKE 1 TABLET BY MOUTH DAILY 03/14/22   Claiborne Rigg,  NP  MELATONIN PO Take 10 mg by mouth at bedtime.    [provider]  tiZANidine (ZANAFLEX) 4 MG tablet Take 1 tablet (4 mg total) by mouth every 6 (six) hours as needed for muscle spasms. 12/28/21   Claiborne Rigg, NP  trihexyphenidyl (ARTANE) 2 MG tablet Take 2 mg by mouth daily. 12/16/21   [provider]  ziprasidone (GEODON) 20 MG capsule Take 20 mg by mouth daily. 01/20/22   [provider]    Family History Family History  Problem Relation Age of Onset   Diabetes Father    Hypertension Mother     Hypertension Sister    Lupus Sister    Heart disease Maternal Grandfather    Lupus Other     Social History Social History   Tobacco Use   Smoking status: Former    Packs/day: 0.25    Years: 4.00    Total pack years: 1.00    Types: Cigarettes    Quit date: 05/01/2016    Years since quitting: 5.9   Smokeless tobacco: Never  Vaping Use   Vaping Use: Never used  Substance Use Topics   Alcohol use: Not Currently    Alcohol/week: 0.0 standard drinks of alcohol    Comment: Quit 2017   Drug use: No     Allergies   Penicillins   Review of Systems Review of Systems  Psychiatric/Behavioral:  Positive for sleep disturbance.   All other systems reviewed and are negative.    Physical Exam Triage Vital Signs ED Triage Vitals  Enc Vitals Group     BP      Pulse      Resp      Temp      Temp src      SpO2      Weight      Height      Head Circumference      Peak Flow      Pain Score      Pain Loc      Pain Edu?      Excl. in GC?    No data found.  Updated Vital Signs BP (!) 105/58   Pulse 92   Temp 98.8 F (37.1 C)   Resp 18   LMP  (LMP Unknown)   SpO2 96%   Visual Acuity Right Eye Distance:   Left Eye Distance:   Bilateral Distance:    Right Eye Near:   Left Eye Near:    Bilateral Near:     Physical Exam Vitals reviewed.  Constitutional:      General: She is not in acute distress.    Appearance: Normal appearance. She is not ill-appearing.  HENT:     Head: Normocephalic and atraumatic.  Pulmonary:     Effort: Pulmonary effort is normal.  Neurological:     General: No focal deficit present.     Mental Status: She is alert and oriented to person, place, and time.  Psychiatric:        Mood and Affect: Mood normal.        Behavior: Behavior normal.        Thought Content: Thought content normal.        Judgment: Judgment normal.      UC Treatments / Results  Labs (all labs ordered are listed, but only abnormal results are  displayed) Labs Reviewed - No data to display  EKG   Radiology No results found.  Procedures Procedures (including  critical care time)  Medications Ordered in UC Medications - No data to display  Initial Impression / Assessment and Plan / UC Course  I have reviewed the triage vital signs and the nursing notes.  Pertinent labs & imaging results that were available during my care of the patient were reviewed by me and considered in my medical decision making (see chart for details).     This patient is a very pleasant 51 y.o. year old female presenting with insomnia x4 days. She is followed by psych and PCP for this but was advised to present to this urgent care by PCP today.  She is currently taking Remeron and hydroxyzine, but has apparently not slept in 4 days.  She is specifically requesting administer Ativan during this visit, we discussed that we do not have this onsite, and as she drove herself here, it would also not be safe to provide this during visit today.  I did send trazodone 50 mg, strongly advised her to adhere to dosage instructions given risk of serotonin syndrome when in combination with her other medications.  She has documentation from the behavioral health urgent care with her today, we discussed that this would be the appropriate place to follow-up if her symptoms persist.  Could also consider follow-up with PCP.  Denies SI/HI. ED return precautions discussed. Patient verbalizes understanding and agreement.    Final Clinical Impressions(s) / UC Diagnoses   Final diagnoses:  Insomnia, unspecified type     Discharge Instructions      -Trazodone at bedtime. Make sure to adhere to dosage instructions. Taking too much of this medication can result in a rare but serious complication called serotonin syndrome, which can cause seizures and other emergent issues.  -Hydroxyzine as needed for anxiety and insomnia, up to every 6 hours. This medication will make you  drowsy, so avoid before driving or operating machinery. Do not drink alcohol while taking this medication.  -Avoid other antihistamines that will make you drowsy while taking this medication, like benedryl. -If your symptoms persist despite treatment - please head back to the behavioral health urgent care! They are specialized in treating issues like this.  -Continue your daily medications as directed.  -If you ever feel like hurting yourself or others, call 911 or go to the ED. If you still cannot sleep, DO NOT take more than the recommended dosage of your medications. This could hurt you.     ED Prescriptions     Medication Sig Dispense Auth. Provider   traZODone (DESYREL) 50 MG tablet Take 1 tablet (50 mg total) by mouth at bedtime. 14 tablet Rhys Martini, PA-C      I have reviewed the PDMP during this encounter.   Rhys Martini, PA-C 04/06/22 1541

## 2022-04-06 NOTE — Telephone Encounter (Signed)
  Chief Complaint: insomnia Symptoms: unable to sleep at all day or night, feeling moody Frequency: 4 days  Pertinent Negatives: NA Disposition: [] ED /[x] Urgent Care (no appt availability in office) / [] Appointment(In office/virtual)/ []  [facility]/ [] Home Care/ [] Refused Recommended Disposition /[] [facility]/ []  Follow-up with PCP Additional Notes: pt unable to sleep for last 4 days. Pt has been prescribed different medications by schizo provider but none helping. Last medication pt has tried is mirtazapine and she said it doesn't help at all. Pt wants to be seen today so she can get help and able to sleep. Advised no appt for telephone or in person. She is unable to set up Mchart on her phone and doesn't have transportation so unable to go to [facility] to see Mobile [facility] so offered UC and scheduled appt today at 1400.   Summary: unable to sleep   Patient states she's unable to sleep for the past 4 days and she has been feeling moody. Patient used the word  Insomnia. No available appointments until 8/2. Please advise      Reason for Disposition  Requesting medication for sleep ("sleeping pill")  Answer Assessment - Initial Assessment Questions 1. DESCRIPTION: "Tell me about your sleeping problem."      Cant go to sleep  2. ONSET: "How long have you been having trouble sleeping?" (e.g., days, weeks, months)     Last 4 days  3. RECURRENT: "Have you had sleeping problems before?"  If Yes, ask: "What happened that time?" "What helped your sleeping problem go away in the past?"      Yes, prescribed mirtazapine by schizo provider  5. PAIN: "Do you have any pain that is keeping you awake?" (e.g., back pain, headache, abdominal pain)     no 6. CAFFEINE ABUSE: "Do you drink caffeinated beverages, and how much each day?" (e.g., coffee, tea, colas)     no 7. ALCOHOL USE OR SUBSTANCE USE (DRUG USE): "Do you drink alcohol or use any illegal drugs?"     no 8. OTHER SYMPTOMS: "Do you  have any other symptoms?"  (e.g., difficulty breathing)     Feeling moody  Protocols used: Insomnia-A-AH

## 2022-04-06 NOTE — ED Triage Notes (Signed)
Pt is present with c/o insomnia. Pt states that she hasn't slept in x4 days. PT states that her sleep aids have not been working.

## 2022-04-07 ENCOUNTER — Ambulatory Visit (HOSPITAL_COMMUNITY)
Admission: EM | Admit: 2022-04-07 | Discharge: 2022-04-08 | Disposition: A | Discharge status: Other Acute Inpatient Hospital | Payer: Medicare Other | Attending: Family | Admitting: Family

## 2022-04-07 ENCOUNTER — Telehealth (HOSPITAL_COMMUNITY): Payer: Self-pay | Admitting: Nurse Practitioner

## 2022-04-07 DIAGNOSIS — F310 Bipolar disorder, current episode hypomanic: Secondary | ICD-10-CM

## 2022-04-07 DIAGNOSIS — Z20822 Contact with and (suspected) exposure to covid-19: Secondary | ICD-10-CM | POA: Insufficient documentation

## 2022-04-07 DIAGNOSIS — F319 Bipolar disorder, unspecified: Secondary | ICD-10-CM | POA: Diagnosis present

## 2022-04-07 DIAGNOSIS — Z79899 Other long term (current) drug therapy: Secondary | ICD-10-CM | POA: Diagnosis not present

## 2022-04-07 LAB — COMPREHENSIVE METABOLIC PANEL
ALT: 37 U/L (ref 0–44)
AST: 34 U/L (ref 15–41)
Albumin: 4.5 g/dL (ref 3.5–5.0)
Alkaline Phosphatase: 60 U/L (ref 38–126)
Anion gap: 14 (ref 5–15)
BUN: 9 mg/dL (ref 6–20)
CO2: 19 mmol/L — ABNORMAL LOW (ref 22–32)
Calcium: 10.1 mg/dL (ref 8.9–10.3)
Chloride: 107 mmol/L (ref 98–111)
Creatinine, Ser: 1.38 mg/dL — ABNORMAL HIGH (ref 0.44–1.00)
GFR, Estimated: 47 mL/min — ABNORMAL LOW (ref >60)
Glucose, Bld: 92 mg/dL (ref 70–99)
Potassium: 3.8 mmol/L (ref 3.5–5.1)
Sodium: 140 mmol/L (ref 135–145)
Total Bilirubin: 0.6 mg/dL (ref 0.3–1.2)
Total Protein: 6.8 g/dL (ref 6.5–8.1)

## 2022-04-07 LAB — CBC WITH DIFFERENTIAL/PLATELET
Abs Immature Granulocytes: 0.02 10*3/uL (ref 0.00–0.07)
Basophils Absolute: 0 10*3/uL (ref 0.0–0.1)
Basophils Relative: 0 %
Eosinophils Absolute: 0.1 10*3/uL (ref 0.0–0.5)
Eosinophils Relative: 1 %
HCT: 39.5 % (ref 36.0–46.0)
Hemoglobin: 12.9 g/dL (ref 12.0–15.0)
Immature Granulocytes: 0 %
Lymphocytes Relative: 55 %
Lymphs Abs: 4.2 10*3/uL — ABNORMAL HIGH (ref 0.7–4.0)
MCH: 23.7 pg — ABNORMAL LOW (ref 26.0–34.0)
MCHC: 32.7 g/dL (ref 30.0–36.0)
MCV: 72.5 fL — ABNORMAL LOW (ref 80.0–100.0)
Monocytes Absolute: 0.4 10*3/uL (ref 0.1–1.0)
Monocytes Relative: 5 %
Neutro Abs: 3.1 10*3/uL (ref 1.7–7.7)
Neutrophils Relative %: 39 %
Platelets: 350 10*3/uL (ref 150–400)
RBC: 5.45 MIL/uL — ABNORMAL HIGH (ref 3.87–5.11)
RDW: 14.6 % (ref 11.5–15.5)
WBC: 7.7 10*3/uL (ref 4.0–10.5)
nRBC: 0 % (ref 0.0–0.2)

## 2022-04-07 LAB — HEMOGLOBIN A1C
Hgb A1c MFr Bld: 5.6 % (ref 4.8–5.6)
Mean Plasma Glucose: 114.02 mg/dL

## 2022-04-07 LAB — POCT URINE DRUG SCREEN - MANUAL ENTRY (I-SCREEN)
POC Amphetamine UR: NOT DETECTED
POC Buprenorphine (BUP): NOT DETECTED
POC Cocaine UR: NOT DETECTED
POC Marijuana UR: NOT DETECTED
POC Methadone UR: NOT DETECTED
POC Methamphetamine UR: NOT DETECTED
POC Morphine: NOT DETECTED
POC Oxazepam (BZO): POSITIVE — AB
POC Oxycodone UR: NOT DETECTED
POC Secobarbital (BAR): NOT DETECTED

## 2022-04-07 LAB — MAGNESIUM: Magnesium: 2.1 mg/dL (ref 1.7–2.4)

## 2022-04-07 LAB — RESP PANEL BY RT-PCR (FLU A&B, COVID) ARPGX2
Influenza A by PCR: NEGATIVE
Influenza B by PCR: NEGATIVE
SARS Coronavirus 2 by RT PCR: NEGATIVE

## 2022-04-07 LAB — POCT PREGNANCY, URINE: Preg Test, Ur: NEGATIVE

## 2022-04-07 LAB — ETHANOL: Alcohol, Ethyl (B): <10 mg/dL (ref <10)

## 2022-04-07 LAB — TSH: TSH: 5.855 u[IU]/mL — ABNORMAL HIGH (ref 0.350–4.500)

## 2022-04-07 LAB — POC SARS CORONAVIRUS 2 AG: SARSCOV2ONAVIRUS 2 AG: NEGATIVE

## 2022-04-07 MED ORDER — TRAZODONE HCL 100 MG PO TABS
100.0000 mg | ORAL_TABLET | Freq: Every day | ORAL | Status: DC
  Administered 2022-04-07: 100 mg via ORAL
  Filled 2022-04-07: qty 1

## 2022-04-07 MED ORDER — HYDROXYZINE HCL 25 MG PO TABS
25.0000 mg | ORAL_TABLET | Freq: Two times a day (BID) | ORAL | Status: DC
  Administered 2022-04-08 (×2): 25 mg via ORAL
  Filled 2022-04-07 (×2): qty 1

## 2022-04-07 MED ORDER — ALUM & MAG HYDROXIDE-SIMETH 200-200-20 MG/5ML PO SUSP: 30.0000 mL | ORAL | Status: DC | PRN

## 2022-04-07 MED ORDER — HYDROXYZINE HCL 25 MG PO TABS
50.0000 mg | ORAL_TABLET | Freq: Every day | ORAL | Status: DC
  Administered 2022-04-07: 50 mg via ORAL
  Filled 2022-04-07: qty 2

## 2022-04-07 MED ORDER — LORAZEPAM 1 MG PO TABS
1.0000 mg | ORAL_TABLET | Freq: Once | ORAL | Status: AC
  Administered 2022-04-07: 1 mg via ORAL
  Filled 2022-04-07: qty 1

## 2022-04-07 MED ORDER — FLUOXETINE HCL 20 MG PO CAPS
20.0000 mg | ORAL_CAPSULE | Freq: Every day | ORAL | Status: DC
  Administered 2022-04-08: 20 mg via ORAL
  Filled 2022-04-07: qty 1

## 2022-04-07 MED ORDER — MAGNESIUM HYDROXIDE 400 MG/5ML PO SUSP: 30.0000 mL | Freq: Every day | ORAL | Status: DC | PRN

## 2022-04-07 MED ORDER — ACETAMINOPHEN 325 MG PO TABS: 650.0000 mg | ORAL_TABLET | Freq: Four times a day (QID) | ORAL | Status: DC | PRN

## 2022-04-07 NOTE — BH Assessment (Signed)
Pt has not slept in 5 days reports dx of insomnia. Pt went to urgent car yesterday and they prescribed trazodone 50mg  however pt reports it has not helped. Pt denies SI, HI, AVH and substance use.

## 2022-04-07 NOTE — ED Notes (Addendum)
Pt admitted to obs due to insomnia and anxiety. Pt A&O x4, anxious but cooperative. Pt is rocking back and forth and has repetitive movements of the mouth. Pt denies SI/HI/AVH. Pt tolerated lab work and skin assessment well. Pt ambulated independently to unit. Oriented to unit/staff. Salad and juice given per pt request. No signs of acute distress noted. Will continue to monitor for safety.
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
## 2022-04-07 NOTE — ED Notes (Signed)
Pt resting in bed. Respirations even and unlabored. Will continue to monitor for safety.  

## 2022-04-07 NOTE — ED Provider Notes (Signed)
[facility] Continuous Assessment Admission H&P  Date: 04/07/22 Patient Name: Jody [name] MRN: [id number] Chief Complaint:  Chief Complaint  Patient presents with   Insomnia      Diagnoses:  Final diagnoses:  Bipolar affective disorder, current episode hypomanic [facility]    HPI: Patient presents voluntarily to [facility] behavioral health for walk-in assessment.  Patient is accompanied by her sister, [name], who remains present during assessment as patient prefers.   Patient is assessed, face-to-face, by nurse practitioner, seated in assessment area, no acute distress.  She  is alert and oriented, pleasant and cooperative during assessment.  She is neat and appropriately groomed.  Denajah reports she has not slept in 5 days, her sister confirms that she has not slept in 5 days. She endorses average appetite.   Patient reports increasing anxiety for two or more days.   Patient presents with facial and oral rhythmic movements of lips and jaw. Visualize apparently involunatry jerking-like motions of arms. She presents with restless behavior.   Koral has been diagnosed with paranoid schizophrenia as well as bipolar disorder.  She has also been diagnosed with anxiety and depression.  She is followed by outpatient psychiatry at [facility], Dr. Vanessa Ralphs, last met with outpatient provider on 03/24/2022. She reports outpatient provider "is updating medications but nothing is working."  Per patient, sister confirms,  current medications include Prozac 20 mg daily, hydroxyzine 25 mg twice daily and hydroxyzine 50 mg nightly.  It appears patient was prescribed Risperidone however this medication caused painful and engorged breasts, medication was discontinued 2 months ago approximately.  Patient also was prescribed trazodone 100 mg nightly on yesterday, first dose of this medication on last night, patient reports did not sleep last night.  Patient  presents with anxious mood,  congruent affect. She denies suicidal and homicidal ideations. Denies history of suicide attempts, denies history of nonsuicidal self-harm.  Patient easily  contracts verbally for safety with this Probation officer.    Patient has normal speech.  She denies auditory and visual hallucinations.  Patient is able to converse coherently with goal-directed thoughts and no distractibility or preoccupation.  Denies symptoms of paranoia.  Objectively there is no evidence of psychosis/mania or delusional thinking.  Patient resides alone in [place], she denies access to weapons.  She denies alcohol and substance use.  Patient offered support and encouragement. Reviewed one time  medication , reviewed potential side effects, patient offered opportunity to ask questions. Patient has been prescribed this medication previously.   Patient's sister shares that current presentation is out of character for patient. Patient's sister and patient's mother verbalize concern for worsening condition.    PHQ 2-9:  [employer] Visit from 01/30/2022 in [place] Office Visit from 12/28/2021 in [place] Office Visit from 05/27/2021 in [place]  Thoughts that you would be better off dead, or of hurting yourself in some way Several days Several days Not at all  PHQ-9 Total Score _0 Flowsheet Row ED from 04/06/2022 in [facility] ED from 01/30/2022 in [place] DEPT  C-SSRS RISK CATEGORY No Risk No Risk        Total Time spent with patient: 30 minutes  Musculoskeletal  Strength & Muscle Tone: within normal limits Gait & Station: normal Patient leans: N/A  Psychiatric Specialty Exam  Presentation General Appearance: Appropriate for Environment;  Casual  Eye Contact:Good  Speech:Clear and Coherent; Normal Rate  Speech  Volume:Normal  Handedness:Right   Mood and Affect  Mood:Anxious  Affect:Congruent   Thought Process  Thought Processes:Coherent; Goal Directed; Linear  Descriptions of Associations:Intact  Orientation:Full (Time, Place and Person)  Thought Content:Logical; WDL    Hallucinations:Hallucinations: None  Ideas of Reference:None  Suicidal Thoughts:Suicidal Thoughts: No  Homicidal Thoughts:Homicidal Thoughts: No   Sensorium  Memory:Immediate Good; Recent Good  Judgment:Fair  Insight:Fair   Executive Functions  Concentration:Good  Attention Span:Good  [place] of Knowledge:Good  Language:Good   Psychomotor Activity  Psychomotor Activity:Psychomotor Activity: Restlessness; Extrapyramidal Side Effects (EPS) Extrapyramidal Side Effects (EPS): Tardive Dyskinesia AIMS Completed?: No (Patient presents with facial and oral rhythmic movements of lips and jaw. Visualize apparently involunatry jerking-like motions of arms.)   Assets  Assets:Communication Skills; Desire for Improvement; Financial Resources/Insurance; Housing; Intimacy; Leisure Time; Physical Health; Resilience; Social Support; Talents/Skills   Sleep  Sleep:Sleep: Poor   Nutritional Assessment (For OBS and FBC admissions only) Has the patient had a weight loss or gain of 10 pounds or more in the last 3 months?: No Has the patient had a decrease in food intake/or appetite?: No Does the patient have dental problems?: No Does the patient have eating habits or behaviors that may be indicators of an eating disorder including binging or inducing vomiting?: No Has the patient recently lost weight without trying?: 0 Has the patient been eating poorly because of a decreased appetite?: 0 Malnutrition Screening Tool Score: 0    Physical Exam Vitals and nursing note reviewed.  Constitutional:      Appearance: Normal appearance. She is well-developed and normal weight.  HENT:     Head:  Normocephalic and atraumatic.     Nose: Nose normal.  Cardiovascular:     Rate and Rhythm: Normal rate.  Pulmonary:     Effort: Pulmonary effort is normal.  Musculoskeletal:        General: Normal range of motion.     Cervical back: Normal range of motion.  Skin:    General: Skin is warm and dry.  Neurological:     Mental Status: She is alert and oriented to person, place, and time.  Psychiatric:        Attention and Perception: Attention and perception normal.        Mood and Affect: Affect normal. Mood is anxious.        Speech: Speech normal.        Behavior: Behavior normal. Behavior is cooperative.        Thought Content: Thought content normal.        Cognition and Memory: Cognition and memory normal.    Review of Systems  Constitutional: Negative.   HENT: Negative.    Eyes: Negative.   Respiratory: Negative.    Cardiovascular: Negative.   Gastrointestinal: Negative.   Genitourinary: Negative.   Musculoskeletal: Negative.   Skin: Negative.   Neurological: Negative.   Psychiatric/Behavioral:  The patient is nervous/anxious and has insomnia.     Blood pressure 125/62, pulse 99, temperature 98.5 F (36.9 C), temperature source Oral, resp. rate 16, SpO2 99 %. There is no height or weight on file to calculate BMI.  Past Psychiatric History: bipolar disorder, paranoid schizophrenia, anxiety and depression  Is the patient at risk to self? No  Has the patient been a risk to self in the past 6 months? No .    Has the patient been a risk to self within  the distant past? No   Is the patient a risk to others? No   Has the patient been a risk to others in the past 6 months? No   Has the patient been a risk to others within the distant past? No   Past Medical History:  Past Medical History:  Diagnosis Date   Anxiety and depression    Bipolar disorder ([place])    Cystitis, interstitial    Elevated troponin    a. 09/2015: CP/elevated troponin up to 0.11 - unclear  significance. CT neg for PE, LHC neg for CAD, normal echo.   Microcytic anemia    Noted on labs   Panic attack    Paranoid schizophrenia ([place])    Tobacco abuse     Past Surgical History:  Procedure Laterality Date   CARDIAC CATHETERIZATION N/A 09/29/2015   Procedure: Left Heart Cath and Coronary Angiography;  Surgeon: Jettie Booze, MD;  Location: [place] [facility];  Service: Cardiovascular;  Laterality: N/A;   TUBAL LIGATION      Family History:  Family History  Problem Relation Age of Onset   Diabetes Father    Hypertension Mother    Hypertension Sister    Lupus Sister    Heart disease Maternal Grandfather    Lupus Other     Social History:  Social History   Socioeconomic History   Marital status: Divorced    Spouse name: Not on file   Number of children: 5   Years of education: 12   Highest education level: Not on file  Occupational History   Occupation: Disabled  Tobacco Use   Smoking status: Former    Packs/day: 0.25    Years: 4.00    Total pack years: 1.00    Types: Cigarettes    Quit date: 05/01/2016    Years since quitting: 5.9   Smokeless tobacco: Never  Vaping Use   Vaping Use: Never used  Substance and Sexual Activity   Alcohol use: Not Currently    Alcohol/week: 0.0 standard drinks of alcohol    Comment: Quit 2017   Drug use: No   Sexual activity: Not Currently    Birth control/protection: Condom  Other Topics Concern   Not on file  Social History Narrative   Lives alone   Caffeine use: Soda- daily   Right-handed   Social Determinants of Health   Financial Resource Strain: Not on file  Food Insecurity: Not on file  Transportation Needs: Not on file  Physical Activity: Not on file  Stress: Not on file  Social Connections: Not on file  Intimate Partner Violence: Not on file    SDOH:  SDOH Screenings   Alcohol Screen: Not on file  Depression (PHQ2-9): Medium Risk (01/30/2022)   Depression (PHQ2-9)    PHQ-2 Score: 12   Financial Resource Strain: Not on file  Food Insecurity: Not on file  Housing: Not on file  Physical Activity: Not on file  Social Connections: Not on file  Stress: Not on file  Tobacco Use: Medium Risk (04/06/2022)   Patient History    Smoking Tobacco Use: Former    Smokeless Tobacco Use: Never    Passive Exposure: Not on file  Transportation Needs: Not on file    Last Labs:  Admission on 01/30/2022, Discharged on 01/31/2022  Component Date Value Ref Range Status   WBC 01/30/2022 7.6  4.0 - 10.5 K/uL Final   RBC 01/30/2022 5.34 (H)  3.87 - 5.11 MIL/uL Final   Hemoglobin  01/30/2022 12.8  12.0 - 15.0 g/dL Final   HCT 01/30/2022 39.8  36.0 - 46.0 % Final   MCV 01/30/2022 74.5 (L)  80.0 - 100.0 fL Final   MCH 01/30/2022 24.0 (L)  26.0 - 34.0 pg Final   MCHC 01/30/2022 32.2  30.0 - 36.0 g/dL Final   RDW 01/30/2022 14.7  11.5 - 15.5 % Final   Platelets 01/30/2022 284  150 - 400 K/uL Final   nRBC 01/30/2022 0.0  0.0 - 0.2 % Final   Neutrophils Relative % 01/30/2022 66  % Final   Neutro Abs 01/30/2022 5.0  1.7 - 7.7 K/uL Final   Lymphocytes Relative 01/30/2022 27  % Final   Lymphs Abs 01/30/2022 2.0  0.7 - 4.0 K/uL Final   Monocytes Relative 01/30/2022 7  % Final   Monocytes Absolute 01/30/2022 0.5  0.1 - 1.0 K/uL Final   Eosinophils Relative 01/30/2022 0  % Final   Eosinophils Absolute 01/30/2022 0.0  0.0 - 0.5 K/uL Final   Basophils Relative 01/30/2022 0  % Final   Basophils Absolute 01/30/2022 0.0  0.0 - 0.1 K/uL Final   Immature Granulocytes 01/30/2022 0  % Final   Abs Immature Granulocytes 01/30/2022 0.02  0.00 - 0.07 K/uL Final   Performed at [facility], [place] [address], [place], [place] [address]   Sodium 01/30/2022 135  135 - 145 mmol/L Final   Potassium 01/30/2022 3.0 (L)  3.5 - 5.1 mmol/L Final   Chloride 01/30/2022 106  98 - 111 mmol/L Final   CO2 01/30/2022 21 (L)  22 - 32 mmol/L Final   Glucose, Bld 01/30/2022 98  70 - 99 mg/dL Final   Glucose  reference range applies only to samples taken after fasting for at least 8 hours.   BUN 01/30/2022 7  6 - 20 mg/dL Final   Creatinine, Ser 01/30/2022 0.97  0.44 - 1.00 mg/dL Final   Calcium 01/30/2022 9.2  8.9 - 10.3 mg/dL Final   Total Protein 01/30/2022 7.5  6.5 - 8.1 g/dL Final   Albumin 01/30/2022 4.6  3.5 - 5.0 g/dL Final   AST 01/30/2022 18  15 - 41 U/L Final   ALT 01/30/2022 16  0 - 44 U/L Final   Alkaline Phosphatase 01/30/2022 54  38 - 126 U/L Final   Total Bilirubin 01/30/2022 0.9  0.3 - 1.2 mg/dL Final   GFR, Estimated 01/30/2022 >60  >60 mL/min Final   Comment: (NOTE) Calculated using the CKD-EPI Creatinine Equation (2021)    Anion gap 01/30/2022 8  5 - 15 Final   Performed at [facility], [place] [address] [place], [place] [address]   Salicylate Lvl 56/38/7564 <7.0 (L)  7.0 - 30.0 mg/dL Final   Performed at [place] [address] [place], [place] [address]   Acetaminophen (Tylenol), Serum 01/30/2022 34 (H)  10 - 30 ug/mL Final   Comment: (NOTE) Therapeutic concentrations vary significantly. A range of 10-30 ug/mL  may be an effective concentration for many patients. However, some  are best treated at concentrations outside of this range. Acetaminophen concentrations >150 ug/mL at 4 hours after ingestion  and >50 ug/mL at 12 hours after ingestion are often associated with  toxic reactions.  Performed at [facility], [place] [address] [place], [place] [address]    Alcohol, Ethyl (B) 01/30/2022 <10  <10 mg/dL Final   Comment: (NOTE) Lowest detectable limit for serum alcohol is 10 mg/dL.  For medical  purposes only. Performed at [facility], [place] [address] [place], [place] [address]    Opiates 01/30/2022 NONE DETECTED  NONE DETECTED Final   Cocaine 01/30/2022 NONE DETECTED  NONE DETECTED Final   Benzodiazepines 01/30/2022 NONE DETECTED  NONE DETECTED Final   Amphetamines 01/30/2022  POSITIVE (A)  NONE DETECTED Final   Tetrahydrocannabinol 01/30/2022 NONE DETECTED  NONE DETECTED Final   Barbiturates 01/30/2022 NONE DETECTED  NONE DETECTED Final   Comment: (NOTE) DRUG SCREEN FOR MEDICAL PURPOSES ONLY.  IF CONFIRMATION IS NEEDED FOR ANY PURPOSE, NOTIFY LAB WITHIN 5 DAYS.  LOWEST DETECTABLE LIMITS FOR URINE DRUG SCREEN Drug Class                     Cutoff (ng/mL) Amphetamine and metabolites    1000 Barbiturate and metabolites    200 Benzodiazepine                 604 Tricyclics and metabolites     300 Opiates and metabolites        300 Cocaine and metabolites        300 THC                            50 Performed at [facility], [place] [address] [place], [place] [address]    SARS Coronavirus 2 by RT PCR 01/30/2022 POSITIVE (A)  NEGATIVE Final   Comment: (NOTE) SARS-CoV-2 target nucleic acids are DETECTED.  The SARS-CoV-2 RNA is generally detectable in upper respiratory specimens during the acute phase of infection. Positive results are indicative of the presence of the identified virus, but do not rule out bacterial infection or co-infection with other pathogens not detected by the test. Clinical correlation with patient history and other diagnostic information is necessary to determine patient infection status. The expected result is Negative.  Fact Sheet for Patients: EntrepreneurPulse.com.au  Fact Sheet for Healthcare Providers: IncredibleEmployment.be  This test is not yet approved or cleared by the [place] FDA and  has been authorized for detection and/or diagnosis of SARS-CoV-2 by FDA under an Emergency Use Authorization (EUA).  This EUA will remain in effect (meaning this test can be used) for the duration of  the COVID-19 declaration under Section 564(b)(1) of the A                          ct, 21 U.S.C. section 360bbb-3(b)(1), unless the authorization is terminated or revoked  sooner.     Influenza A by PCR 01/30/2022 NEGATIVE  NEGATIVE Final   Influenza B by PCR 01/30/2022 NEGATIVE  NEGATIVE Final   Comment: (NOTE) The Xpert Xpress SARS-CoV-2/FLU/RSV plus assay is intended as an aid in the diagnosis of influenza from Nasopharyngeal swab specimens and should not be used as a sole basis for treatment. Nasal washings and aspirates are unacceptable for Xpert Xpress SARS-CoV-2/FLU/RSV testing.  Fact Sheet for Patients: EntrepreneurPulse.com.au  Fact Sheet for Healthcare Providers: IncredibleEmployment.be  This test is not yet approved or cleared by the [place] FDA and has been authorized for detection and/or diagnosis of SARS-CoV-2 by FDA under an Emergency Use Authorization (EUA). This EUA will remain in effect (meaning this test can be used) for the duration of the COVID-19 declaration under Section 564(b)(1) of the Act, 21 U.S.C. section 360bbb-3(b)(1), unless the authorization is terminated or revoked.  Performed at [facility], [place] [address] [place], [place] [address]    Magnesium 01/30/2022 2.1  1.7 - 2.4 mg/dL Final   Performed at [place] [address], [place], [place] [address]   Sodium 01/31/2022 139  135 - 145 mmol/L Final   Potassium 01/31/2022 3.2 (L)  3.5 - 5.1 mmol/L Final   Chloride 01/31/2022 106  98 - 111 mmol/L Final   CO2 01/31/2022 25  22 - 32 mmol/L Final   Glucose, Bld 01/31/2022 114 (H)  70 - 99 mg/dL Final   Glucose reference range applies only to samples taken after fasting for at least 8 hours.   BUN 01/31/2022 7  6 - 20 mg/dL Final   Creatinine, Ser 01/31/2022 1.08 (H)  0.44 - 1.00 mg/dL Final   Calcium 01/31/2022 9.3  8.9 - 10.3 mg/dL Final   GFR, Estimated 01/31/2022 >60  >60 mL/min Final   Comment: (NOTE) Calculated using the CKD-EPI Creatinine Equation (2021)    Anion gap 01/31/2022 8  5 - 15 Final   Performed at [facility], [place] [address] [place], [place] [address]   Acetaminophen (Tylenol), Serum 01/31/2022 16  10 - 30 ug/mL Final   Comment: (NOTE) Therapeutic concentrations vary significantly. A range of 10-30 ug/mL  may be an effective concentration for many patients. However, some  are best treated at concentrations outside of this range. Acetaminophen concentrations >150 ug/mL at 4 hours after ingestion  and >50 ug/mL at 12 hours after ingestion are often associated with  toxic reactions.  Performed at [facility], [place] [address] [place], [place] [address]   Office Visit on 01/30/2022  Component Date Value Ref Range Status   Specific Gravity, UA 01/30/2022 1.009  1.005 - 1.030 Final   pH, UA 01/30/2022 6.5  5.0 - 7.5 Final   Color, UA 01/30/2022 Yellow  Yellow Final   Appearance Ur 01/30/2022 Clear  Clear Final   Leukocytes,UA 01/30/2022 Trace (A)  Negative Final   Protein,UA 01/30/2022 Negative  Negative/Trace Final   Glucose, UA 01/30/2022 Negative  Negative Final   Ketones, UA 01/30/2022 Negative  Negative Final   RBC, UA 01/30/2022 Negative  Negative Final   Bilirubin, UA 01/30/2022 Negative  Negative Final   Urobilinogen, Ur 01/30/2022 0.2  0.2 - 1.0 mg/dL Final   Nitrite, UA 01/30/2022 Negative  Negative Final   Microscopic Examination 01/30/2022 See below:   Final   Microscopic was indicated and was performed.   Urine Culture, Routine 01/30/2022 Final report   Final   Organism ID, Bacteria 01/30/2022 Comment   Final   Comment: Mixed urogenital flora 10,000-25,000 colony forming units per mL    WBC 01/30/2022 8.9  3.4 - 10.8 x10E3/uL Final   RBC 01/30/2022 5.38 (H)  3.77 - 5.28 x10E6/uL Final   Hemoglobin 01/30/2022 12.8  11.1 - 15.9 g/dL Final   Hematocrit 01/30/2022 39.6  34.0 - 46.6 % Final   MCV 01/30/2022 74 (L)  79 - 97 fL Final   MCH 01/30/2022 23.8 (L)  26.6 - 33.0 pg Final   MCHC 01/30/2022 32.3  31.5 - 35.7 g/dL Final   RDW  01/30/2022 15.0  11.7 - 15.4 % Final   Platelets 01/30/2022 302  150 - 450 x10E3/uL Final   Neutrophils 01/30/2022 78  Not Estab. % Final   Lymphs 01/30/2022 15  Not Estab. % Final   Monocytes 01/30/2022 6  Not Estab. % Final   Eos 01/30/2022 0  Not Estab. % Final  Basos 01/30/2022 0  Not Estab. % Final   Neutrophils Absolute 01/30/2022 6.9  1.4 - 7.0 x10E3/uL Final   Lymphocytes Absolute 01/30/2022 1.3  0.7 - 3.1 x10E3/uL Final   Monocytes Absolute 01/30/2022 0.6  0.1 - 0.9 x10E3/uL Final   EOS (ABSOLUTE) 01/30/2022 0.0  0.0 - 0.4 x10E3/uL Final   Basophils Absolute 01/30/2022 0.0  0.0 - 0.2 x10E3/uL Final   Immature Granulocytes 01/30/2022 1  Not Estab. % Final   Immature Grans (Abs) 01/30/2022 0.0  0.0 - 0.1 x10E3/uL Final   Color, UA 01/30/2022 yellow  yellow Final   Clarity, UA 01/30/2022 clear  clear Final   Glucose, UA 01/30/2022 negative  negative mg/dL Final   Bilirubin, UA 01/30/2022 negative  negative Final   Ketones, POC UA 01/30/2022 negative  negative mg/dL Final   Spec Grav, UA 01/30/2022 1.010  1.010 - 1.025 Final   Blood, UA 01/30/2022 negative  negative Final   pH, UA 01/30/2022 6.0  5.0 - 8.0 Final   POC PROTEIN,UA 01/30/2022 negative  negative, trace Final   Urobilinogen, UA 01/30/2022 0.2  0.2 or 1.0 E.U./dL Final   Nitrite, UA 01/30/2022 Negative  Negative Final   Leukocytes, UA 01/30/2022 Trace (A)  Negative Final   WBC, UA 01/30/2022 0-5  0 - 5 /hpf Final   RBC, Urine 01/30/2022 None seen  0 - 2 /hpf Final   Epithelial Cells (non renal) 01/30/2022 0-10  0 - 10 /hpf Final   Casts 01/30/2022 None seen  None seen /lpf Final   Bacteria, UA 01/30/2022 None seen  None seen/Few Final  Office Visit on 12/28/2021  Component Date Value Ref Range Status   Glucose 12/28/2021 108 (H)  70 - 99 mg/dL Final   BUN 12/28/2021 8  6 - 24 mg/dL Final   Creatinine, Ser 12/28/2021 1.21 (H)  0.57 - 1.00 mg/dL Final   eGFR 12/28/2021 55 (L)  >59 mL/min/1.73 Final   BUN/Creatinine  Ratio 12/28/2021 7 (L)  9 - 23 Final   Sodium 12/28/2021 141  134 - 144 mmol/L Final   Potassium 12/28/2021 4.1  3.5 - 5.2 mmol/L Final   Chloride 12/28/2021 102  96 - 106 mmol/L Final   CO2 12/28/2021 24  20 - 29 mmol/L Final   Calcium 12/28/2021 9.9  8.7 - 10.2 mg/dL Final   Total Protein 12/28/2021 6.9  6.0 - 8.5 g/dL Final   Albumin 12/28/2021 4.8  3.8 - 4.8 g/dL Final   Globulin, Total 12/28/2021 2.1  1.5 - 4.5 g/dL Final   Albumin/Globulin Ratio 12/28/2021 2.3 (H)  1.2 - 2.2 Final   Bilirubin Total 12/28/2021 0.2  0.0 - 1.2 mg/dL Final   Alkaline Phosphatase 12/28/2021 74  44 - 121 IU/L Final   AST 12/28/2021 16  0 - 40 IU/L Final   ALT 12/28/2021 19  0 - 32 IU/L Final   Prolactin 12/28/2021 155.0 (H)  4.8 - 23.3 ng/mL Final   Hgb A1c MFr Bld 12/28/2021 5.7 (H)  4.8 - 5.6 % Final   Comment:          Prediabetes: 5.7 - 6.4          Diabetes: >6.4          Glycemic control for adults with diabetes: <7.0    Est. average glucose Bld gHb Est-m* 12/28/2021 117  mg/dL Final   Cholesterol, Total 12/28/2021 228 (H)  100 - 199 mg/dL Final   Triglycerides 12/28/2021 104  0 - 149 mg/dL  Final   HDL 12/28/2021 40  >39 mg/dL Final   VLDL Cholesterol Cal 12/28/2021 19  5 - 40 mg/dL Final   LDL Chol Calc (NIH) 12/28/2021 169 (H)  0 - 99 mg/dL Final   Chol/HDL Ratio 12/28/2021 5.7 (H)  0.0 - 4.4 ratio Final   Comment:                                   T. Chol/HDL Ratio                                             Men  Women                               1/2 Avg.Risk  3.4    3.3                                   Avg.Risk  5.0    4.4                                2X Avg.Risk  9.6    7.1                                3X Avg.Risk 23.4   11.0    WBC 12/28/2021 4.4  3.4 - 10.8 x10E3/uL Final   RBC 12/28/2021 5.62 (H)  3.77 - 5.28 x10E6/uL Final   Hemoglobin 12/28/2021 13.1  11.1 - 15.9 g/dL Final   Hematocrit 12/28/2021 41.4  34.0 - 46.6 % Final   MCV 12/28/2021 74 (L)  79 - 97 fL Final   MCH  12/28/2021 23.3 (L)  26.6 - 33.0 pg Final   MCHC 12/28/2021 31.6  31.5 - 35.7 g/dL Final   RDW 12/28/2021 14.6  11.7 - 15.4 % Final   Platelets 12/28/2021 268  150 - 450 x10E3/uL Final   Specific Gravity, UA 12/28/2021 1.010  1.005 - 1.030 Final   pH, UA 12/28/2021 6.0  5.0 - 7.5 Final   Color, UA 12/28/2021 Yellow  Yellow Final   Appearance Ur 12/28/2021 Clear  Clear Final   Leukocytes,UA 12/28/2021 Trace (A)  Negative Final   Protein,UA 12/28/2021 Negative  Negative/Trace Final   Glucose, UA 12/28/2021 Negative  Negative Final   Ketones, UA 12/28/2021 Negative  Negative Final   RBC, UA 12/28/2021 Negative  Negative Final   Bilirubin, UA 12/28/2021 Negative  Negative Final   Urobilinogen, Ur 12/28/2021 0.2  0.2 - 1.0 mg/dL Final   Nitrite, UA 12/28/2021 Negative  Negative Final   Microscopic Examination 12/28/2021 See below:   Final   Microscopic was indicated and was performed.   WBC, UA 12/28/2021 6-10 (A)  0 - 5 /hpf Final   RBC, Urine 12/28/2021 None seen  0 - 2 /hpf Final   Epithelial Cells (non renal) 12/28/2021 0-10  0 - 10 /hpf Final   Casts 12/28/2021 None seen  None seen /lpf Final   Crystals 12/28/2021 Present (A)  N/A Final   Crystal Type 12/28/2021 Calcium Oxalate  N/A Final  Bacteria, UA 12/28/2021 Few  None seen/Few Final    Allergies: Penicillins  PTA Medications: (Not in a hospital admission)   Medical Decision Making  Patient reviewed with Dr Dwyane Dee. She will be placed in observation area while awaiting inpatient psychiatric hospitalization. Patient remains voluntary.   Laboratory studies ordered including CBC, CMP, ethanol, A1c, hepatic function, lipid panel, magnesium and TSH.  Urine pregnancy and urine drug screen ordered.  EKG order initiated.  Will hold antipsychotic medication at this time, laboratory studies not yet resulted. QT/QTc prolonged, measures 414/495.  Current medications: -Acetaminophen 650 mg every 6 as needed/mild pain -Maalox 30 mL oral  every 4 as needed/digestion -Magnesium hydroxide 30 mL daily as needed/mild constipation  Restarted home medications including: -Fluoxetine 20 mg daily -Hydroxyzine 25 BID and Hydroxyzine 74m QHS -Trazodone 100 mg nightly   Additional Medication: -lorazepam 166monce/mood /sleep    Recommendations  Based on my evaluation the patient does not appear to have an emergency medical condition.  TiLucky RathkeFNP 04/07/22  8:21 PM

## 2022-04-07 NOTE — BH Assessment (Signed)
Per Berneice Heinrich, NP - patient meets criteria for inpatient hospitalization.  Writer faxed referrals to the following facilities:  [facility]  [facility]  [facility]

## 2022-04-07 NOTE — ED Notes (Signed)
Pt up to nurse's station stating she cannot sleep and is requesting more medication. Cecilio Asper, NP made aware.

## 2022-04-08 ENCOUNTER — Inpatient Hospital Stay (HOSPITAL_COMMUNITY)
Admission: AD | Admit: 2022-04-08 | Discharge: 2022-04-12 | DRG: 885 | Disposition: A | Discharge status: Home / Self Care | Payer: Medicare Other | Source: Intra-hospital | Attending: Emergency Medicine | Admitting: Emergency Medicine

## 2022-04-08 ENCOUNTER — Other Ambulatory Visit: Payer: Self-pay

## 2022-04-08 ENCOUNTER — Encounter (HOSPITAL_COMMUNITY): Payer: Self-pay | Admitting: Psychiatry

## 2022-04-08 DIAGNOSIS — Z87891 Personal history of nicotine dependence: Secondary | ICD-10-CM | POA: Diagnosis not present

## 2022-04-08 DIAGNOSIS — G4700 Insomnia, unspecified: Secondary | ICD-10-CM | POA: Diagnosis not present

## 2022-04-08 DIAGNOSIS — F310 Bipolar disorder, current episode hypomanic: Principal | ICD-10-CM | POA: Diagnosis present

## 2022-04-08 DIAGNOSIS — D509 Iron deficiency anemia, unspecified: Secondary | ICD-10-CM | POA: Diagnosis not present

## 2022-04-08 DIAGNOSIS — F02A0 Dementia in other diseases classified elsewhere, mild, without behavioral disturbance, psychotic disturbance, mood disturbance, and anxiety: Secondary | ICD-10-CM

## 2022-04-08 DIAGNOSIS — F02A3 Dementia in other diseases classified elsewhere, mild, with mood disturbance: Secondary | ICD-10-CM | POA: Diagnosis not present

## 2022-04-08 DIAGNOSIS — Z833 Family history of diabetes mellitus: Secondary | ICD-10-CM | POA: Diagnosis not present

## 2022-04-08 DIAGNOSIS — R9431 Abnormal electrocardiogram [ECG] [EKG]: Secondary | ICD-10-CM | POA: Diagnosis present

## 2022-04-08 DIAGNOSIS — F02A4 Dementia in other diseases classified elsewhere, mild, with anxiety: Secondary | ICD-10-CM | POA: Diagnosis not present

## 2022-04-08 DIAGNOSIS — F251 Schizoaffective disorder, depressive type: Principal | ICD-10-CM

## 2022-04-08 DIAGNOSIS — G2401 Drug induced subacute dyskinesia: Secondary | ICD-10-CM

## 2022-04-08 DIAGNOSIS — G309 Alzheimer's disease, unspecified: Secondary | ICD-10-CM | POA: Diagnosis present

## 2022-04-08 DIAGNOSIS — Z79899 Other long term (current) drug therapy: Secondary | ICD-10-CM

## 2022-04-08 DIAGNOSIS — F411 Generalized anxiety disorder: Secondary | ICD-10-CM | POA: Diagnosis not present

## 2022-04-08 DIAGNOSIS — Z8249 Family history of ischemic heart disease and other diseases of the circulatory system: Secondary | ICD-10-CM | POA: Diagnosis not present

## 2022-04-08 DIAGNOSIS — Z832 Family history of diseases of the blood and blood-forming organs and certain disorders involving the immune mechanism: Secondary | ICD-10-CM | POA: Diagnosis not present

## 2022-04-08 DIAGNOSIS — I10 Essential (primary) hypertension: Secondary | ICD-10-CM | POA: Diagnosis not present

## 2022-04-08 DIAGNOSIS — E559 Vitamin D deficiency, unspecified: Secondary | ICD-10-CM | POA: Diagnosis present

## 2022-04-08 DIAGNOSIS — R946 Abnormal results of thyroid function studies: Secondary | ICD-10-CM | POA: Diagnosis present

## 2022-04-08 DIAGNOSIS — F250 Schizoaffective disorder, bipolar type: Secondary | ICD-10-CM | POA: Diagnosis not present

## 2022-04-08 MED ORDER — TRAZODONE HCL 100 MG PO TABS
100.0000 mg | ORAL_TABLET | Freq: Every evening | ORAL | Status: DC | PRN
  Administered 2022-04-08: 100 mg via ORAL
  Filled 2022-04-08: qty 1

## 2022-04-08 MED ORDER — LORAZEPAM 1 MG PO TABS
1.0000 mg | ORAL_TABLET | Freq: Once | ORAL | Status: AC
  Administered 2022-04-08: 1 mg via ORAL
  Filled 2022-04-08: qty 1

## 2022-04-08 MED ORDER — ACETAMINOPHEN 325 MG PO TABS: 650.0000 mg | ORAL_TABLET | Freq: Four times a day (QID) | ORAL | Status: DC | PRN

## 2022-04-08 MED ORDER — VALBENAZINE TOSYLATE 40 MG PO CAPS
40.0000 mg | ORAL_CAPSULE | Freq: Every day | ORAL | Status: DC
  Administered 2022-04-09 – 2022-04-12 (×4): 40 mg via ORAL
  Filled 2022-04-08 (×7): qty 1

## 2022-04-08 MED ORDER — MAGNESIUM HYDROXIDE 400 MG/5ML PO SUSP: 30.0000 mL | Freq: Every day | ORAL | Status: DC | PRN

## 2022-04-08 MED ORDER — ALUM & MAG HYDROXIDE-SIMETH 200-200-20 MG/5ML PO SUSP: 30.0000 mL | ORAL | Status: DC | PRN

## 2022-04-08 MED ORDER — QUETIAPINE FUMARATE 25 MG PO TABS
25.0000 mg | ORAL_TABLET | Freq: Once | ORAL | Status: AC
  Administered 2022-04-08: 25 mg via ORAL
  Filled 2022-04-08: qty 1

## 2022-04-08 MED ORDER — FLUOXETINE HCL 20 MG PO CAPS
20.0000 mg | ORAL_CAPSULE | Freq: Every day | ORAL | Status: DC
  Administered 2022-04-09 – 2022-04-12 (×4): 20 mg via ORAL
  Filled 2022-04-08 (×7): qty 1

## 2022-04-08 MED ORDER — QUETIAPINE FUMARATE 50 MG PO TABS
50.0000 mg | ORAL_TABLET | Freq: Every day | ORAL | Status: DC
  Administered 2022-04-08: 50 mg via ORAL
  Filled 2022-04-08 (×4): qty 1

## 2022-04-08 MED ORDER — HYDROXYZINE HCL 25 MG PO TABS: 25.0000 mg | ORAL_TABLET | Freq: Three times a day (TID) | ORAL | Status: DC | PRN

## 2022-04-08 MED ORDER — TRAZODONE HCL 50 MG PO TABS
50.0000 mg | ORAL_TABLET | Freq: Every evening | ORAL | Status: DC | PRN
  Filled 2022-04-08: qty 1

## 2022-04-08 MED ORDER — VALBENAZINE TOSYLATE 40 MG PO CAPS
40.0000 mg | ORAL_CAPSULE | Freq: Every day | ORAL | Status: DC
Start: 2022-04-08 — End: 2022-04-08
  Administered 2022-04-08: 40 mg via ORAL
  Filled 2022-04-08: qty 1

## 2022-04-08 NOTE — ED Notes (Signed)
Patient resting quietly in bed with eyes closed, Respirations equal and unlabored, skin warm and dry, NAD. No change in assessment or acuity. Routine safety checks conducted according to facility protocol. Will continue to monitor for safety.      

## 2022-04-08 NOTE — ED Notes (Addendum)
Pt continues to complain of not being able to go to sleep. Cecilio Asper, NP and Roselyn Bering, NP made aware.

## 2022-04-08 NOTE — ED Notes (Signed)
Pt laying in bed cal and cooperative. No complaints noted at this time. Will continue to monitor for safety

## 2022-04-08 NOTE — Discharge Instructions (Addendum)
Take all medications as prescribed. Keep all follow-up appointments as scheduled.  Do not consume alcohol or use illegal drugs while on prescription medications. Report any adverse effects from your medications to your primary care provider promptly.  In the event of recurrent symptoms or worsening symptoms, call 911, a crisis hotline, or go to the nearest emergency department for evaluation.   

## 2022-04-08 NOTE — ED Notes (Signed)
Patient discharge via ambulatory with a steady gait with [facility] staff member. Patient had no belongings. Respirations equal and unlabored, skin warm and dry. No acute distress noted.

## 2022-04-08 NOTE — Progress Notes (Signed)
Admission Note: Patient is a 51 year old female who is admitted voluntarily to the unit from [facility] for Insomnia.  Patient report difficulty sleeping for the past five days.  Denies suicidal thoughts, auditory and visual hallucinations.  Stated she is here to get medication for sleep.  Patient is alert and oriented x 4.  Patient presents with anxious mood and affect.  Admission plan of care reviewed, consent signed.  Skin assessment completed.  Skin is dry and intact.  Patient oriented to the unit, staff and room.  Routine safety checks initiated.  Patient verbalizes understanding of unit rules/protocols.  Patient is safe on the unit.

## 2022-04-08 NOTE — ED Notes (Signed)
Pt has not been able to sleep all night. Continues to request medication and to speak with a provider. Roselyn Bering, NP made aware.

## 2022-04-08 NOTE — Tx Team (Signed)
Initial Treatment Plan 04/08/2022 6:18 PM Jody [name] [id number]    PATIENT STRESSORS: Financial difficulties   Health problems     PATIENT STRENGTHS: Ability for insight  Capable of independent living  Motivation for treatment/growth  Supportive family/friends    PATIENT IDENTIFIED PROBLEMS: "Be able to sleep at night"  "Right medication"  Insomnia  Depression   Anxiety             DISCHARGE CRITERIA:  Adequate post-discharge living arrangements Motivation to continue treatment in a less acute level of care  PRELIMINARY DISCHARGE PLAN: Attend aftercare/continuing care group Attend PHP/IOP Outpatient therapy Return to previous living arrangement  PATIENT/FAMILY INVOLVEMENT: This treatment plan has been presented to and reviewed with the patient, Jody [name], and/or family member.  The patient and family have been given the opportunity to ask questions and make suggestions.  Clarene Critchley, RN 04/08/2022, 6:18 PM

## 2022-04-08 NOTE — ED Notes (Signed)
Pt has still not been to sleep, continues to request medication. Roselyn Bering, NP made aware. Awaiting orders.

## 2022-04-08 NOTE — Progress Notes (Signed)
   04/08/22 2045  Psych Admission Type (Psych Patients Only)  Admission Status Voluntary  Psychosocial Assessment  Patient Complaints Sleep disturbance;Insomnia  Eye Contact Fair  Facial Expression Anxious  Affect Appropriate to circumstance  Speech Logical/coherent  Interaction Assertive  Motor Activity Fidgety  Appearance/Hygiene Unremarkable  Behavior Characteristics Cooperative;Appropriate to situation  Mood Anxious  Thought Process  Coherency WDL  Content WDL  Delusions None reported or observed  Perception WDL  Hallucination None reported or observed  Judgment Impaired (lack of sleep)  Confusion None  Danger to Self  Current suicidal ideation? Denies  Danger to Others  Danger to Others None reported or observed   Progress note   D: Pt seen in her room. Pt denies SI, HI, AVH. Pt rates pain  0/10. Pt rates anxiety  7/10 and depression  5/10. Pt states that she has not slept going on 6 days. Pt was previously at [facility] 6/26 for same issue. Pt had a recent medication change at end of last month but said her sleep has been poor for the past 2-3 months. Pt denies any issues with her meds and her current mental health concerns. She just wants to be able to fall asleep. States that every medication she has been given simply makes her yawn. Pt states that she has been compliant with her meds. Is followed by [facility]. Pt encouraged to speak with provider in the morning to see if they have any insights. Pt also encouraged to try and rest tonight as best she can with the medications that have been scheduled. No other complaints noted.  A: Pt provided support and encouragement. Pt given scheduled medication as prescribed. PRNs as appropriate. Q15 min checks for safety.   R: Pt safe on the unit. Will continue to monitor.

## 2022-04-08 NOTE — ED Notes (Signed)
Safe transport called for transport to [facility]. [facility] staff reported that there will be a delay in transport. [facility] stated that she was on her way to [place] now and that she would call for information for transport on when she on her way back.

## 2022-04-08 NOTE — ED Notes (Signed)
Safe transport called back and stated estimated time arrival is 30 minutes.

## 2022-04-08 NOTE — ED Provider Notes (Signed)
FBC/OBS ASAP Discharge Summary  Date and Time: 04/08/2022 11:18 AM  Name: Jody [name]  MRN:  [id number]   Discharge Diagnoses:  Final diagnoses:  Bipolar affective disorder, current episode hypomanic [facility])    Subjective: Jody [name] reported " This is day 5 and I haven't had any sleep."   Patient was seen evaluated face-to-face.  She presents with a bright and pleasant affect.  Denying suicidal or homicidal ideations.  Denies auditory visual hallucinations.  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  She was initially recommended for overnight observation however after multiple medications to assist with sleep patient states she has not been able to go to sleep.  Patient was amendable to inpatient admission for medication management..  Patient accepted to [facility] behavioral health under the care of Dr. Nelda Marseille.  Per admission assessment note: "Jody [name] has been diagnosed with paranoid schizophrenia as well as bipolar disorder.  She has also been diagnosed with anxiety and depression.  She is followed by outpatient psychiatry at [facility], Dr. Vanessa Ralphs, last met with outpatient provider on 03/24/2022. She reports outpatient provider "is updating medications but nothing is working."Per patient, sister confirms,  current medications include Prozac 20 mg daily, hydroxyzine 25 mg twice daily and hydroxyzine 50 mg nightly.  It appears patient was prescribed Risperidone however this medication caused painful and engorged breasts, medication was discontinued 2 months ago approximately.  Patient also was prescribed trazodone 100 mg nightly on yesterday, first dose of this medication on last night, patient reports did not sleep last night."   Stay Summary:  Total Time spent with patient: 15 minutes  Past Psychiatric History:  Past Medical History:  Past Medical History:  Diagnosis  Date   Anxiety and depression    Bipolar disorder ([place])    Cystitis, interstitial    Elevated troponin    a. 09/2015: CP/elevated troponin up to 0.11 - unclear significance. CT neg for PE, LHC neg for CAD, normal echo.   Microcytic anemia    Noted on labs   Panic attack    Paranoid schizophrenia ([place])    Tobacco abuse     Past Surgical History:  Procedure Laterality Date   CARDIAC CATHETERIZATION N/A 09/29/2015   Procedure: Left Heart Cath and Coronary Angiography;  Surgeon: Jettie Booze, MD;  Location: [place] CV LAB;  Service: Cardiovascular;  Laterality: N/A;   TUBAL LIGATION     Family History:  Family History  Problem Relation Age of Onset   Diabetes Father    Hypertension Mother    Hypertension Sister    Lupus Sister    Heart disease Maternal Grandfather    Lupus Other    Family Psychiatric History:  Social History:  Social History   Substance and Sexual Activity  Alcohol Use Not Currently   Alcohol/week: 0.0 standard drinks of alcohol   Comment: Quit 2017     Social History   Substance and Sexual Activity  Drug Use No    Social History   Socioeconomic History   Marital status: Divorced    Spouse name: Not on file   Number of children: 5   Years of education: 12   Highest education level: Not on file  Occupational History   Occupation: Disabled  Tobacco Use   Smoking status: Former    Packs/day: 0.25    Years: 4.00    Total pack years: 1.00    Types: Cigarettes  Quit date: 05/01/2016    Years since quitting: 5.9   Smokeless tobacco: Never  Vaping Use   Vaping Use: Never used  Substance and Sexual Activity   Alcohol use: Not Currently    Alcohol/week: 0.0 standard drinks of alcohol    Comment: Quit 2017   Drug use: No   Sexual activity: Not Currently    Birth control/protection: Condom  Other Topics Concern   Not on file  Social History Narrative   Lives alone   Caffeine use: Soda- daily   Right-handed   Social Determinants  of Health   Financial Resource Strain: Not on file  Food Insecurity: Not on file  Transportation Needs: Not on file  Physical Activity: Not on file  Stress: Not on file  Social Connections: Not on file   SDOH:  SDOH Screenings   Alcohol Screen: Not on file  Depression (PHQ2-9): Medium Risk (01/30/2022)   Depression (PHQ2-9)    PHQ-2 Score: 12  Financial Resource Strain: Not on file  Food Insecurity: Not on file  Housing: Not on file  Physical Activity: Not on file  Social Connections: Not on file  Stress: Not on file  Tobacco Use: Medium Risk (04/06/2022)   Patient History    Smoking Tobacco Use: Former    Smokeless Tobacco Use: Never    Passive Exposure: Not on file  Transportation Needs: Not on file    Tobacco Cessation:  N/A, patient does not currently use tobacco products  Current Medications:  Current Facility-Administered Medications  Medication Dose Route Frequency Provider Last Rate Last Admin   acetaminophen (TYLENOL) tablet 650 mg  650 mg Oral Q6H PRN Lucky Rathke, FNP       alum & mag hydroxide-simeth (MAALOX/MYLANTA) 200-200-20 MG/5ML suspension 30 mL  30 mL Oral Q4H PRN Lucky Rathke, FNP       FLUoxetine (PROZAC) capsule 20 mg  20 mg Oral Daily Lucky Rathke, FNP   20 mg at 04/08/22 0902   hydrOXYzine (ATARAX) tablet 25 mg  25 mg Oral BID WC Lucky Rathke, FNP   25 mg at 04/08/22 0841   hydrOXYzine (ATARAX) tablet 50 mg  50 mg Oral QHS Lucky Rathke, FNP   50 mg at 04/07/22 2129   magnesium hydroxide (MILK OF MAGNESIA) suspension 30 mL  30 mL Oral Daily PRN Lucky Rathke, FNP       traZODone (DESYREL) tablet 100 mg  100 mg Oral QHS Lucky Rathke, FNP   100 mg at 04/07/22 2129   valbenazine (INGREZZA) capsule 40 mg  40 mg Oral Daily Bobbitt, Shalon E, NP   40 mg at 04/08/22 0456   Current Outpatient Medications  Medication Sig Dispense Refill   cloNIDine (CATAPRES) 0.1 MG tablet Take 0.1 mg by mouth at bedtime.     ferrous gluconate (FERGON) 324 MG tablet TAKE  1 TABLET BY MOUTH EVERY MORNING WITH BREAKFAST 28 tablet 2   FLUoxetine (PROZAC) 40 MG capsule Take 40 mg by mouth daily.     hydrochlorothiazide (HYDRODIURIL) 25 MG tablet TAKE 1 TABLET BY MOUTH DAILY 28 tablet 1   MELATONIN PO Take 10 mg by mouth at bedtime.     tiZANidine (ZANAFLEX) 4 MG tablet Take 1 tablet (4 mg total) by mouth every 6 (six) hours as needed for muscle spasms. 60 tablet 1   trihexyphenidyl (ARTANE) 2 MG tablet Take 2 mg by mouth daily.      PTA Medications: (Not in a hospital admission)  01/30/2022    3:00 PM 12/28/2021   11:24 AM 05/27/2021    9:28 AM  Depression screen PHQ 2/9  Decreased Interest _0 Down, Depressed, Hopeless _1 PHQ - 2 Score _2 Altered sleeping _3 Tired, decreased energy _4 Change in appetite 1 0 1  Feeling bad or failure about yourself  1 0 1  Trouble concentrating _5 Moving slowly or fidgety/restless _6 Suicidal thoughts 1 1 0  PHQ-9 Score _7 Flowsheet Row ED from 04/07/2022 in [facility] ED from 04/06/2022 in [facility] ED from 01/30/2022 in [place] DEPT  C-SSRS RISK CATEGORY No Risk No Risk No Risk       Musculoskeletal  Strength & Muscle Tone: within normal limits Gait & Station: normal Patient leans: N/A  Psychiatric Specialty Exam  Presentation  General Appearance: Appropriate for Environment; Casual  Eye Contact:Good  Speech:Clear and Coherent; Normal Rate  Speech Volume:Normal  Handedness:Right   Mood and Affect  Mood:Anxious  Affect:Congruent   Thought Process  Thought Processes:Coherent; Goal Directed; Linear  Descriptions of Associations:Intact  Orientation:Full (Time, Place and Person)  Thought Content:Logical; WDL  Diagnosis of Schizophrenia or Schizoaffective disorder in past: Yes (Dx'ed with schizophrenia 5 years ago.)    Hallucinations:Hallucinations: None  Ideas of  Reference:None  Suicidal Thoughts:Suicidal Thoughts: No  Homicidal Thoughts:Homicidal Thoughts: No   Sensorium  Memory:Immediate Good; Recent Good  Judgment:Fair  Insight:Fair   Executive Functions  Concentration:Good  Attention Span:Good  [place] of Knowledge:Good  Language:Good   Psychomotor Activity  Psychomotor Activity:Psychomotor Activity: Restlessness; Extrapyramidal Side Effects (EPS) Extrapyramidal Side Effects (EPS): Tardive Dyskinesia AIMS Completed?: No (Patient presents with facial and oral rhythmic movements of lips and jaw. Visualize apparently involunatry jerking-like motions of arms.)   Assets  Assets:Communication Skills; Desire for Improvement; Financial Resources/Insurance; Housing; Intimacy; Leisure Time; Physical Health; Resilience; Social Support; Talents/Skills   Sleep  Sleep:Sleep: Poor   Nutritional Assessment (For OBS and FBC admissions only) Has the patient had a weight loss or gain of 10 pounds or more in the last 3 months?: No Has the patient had a decrease in food intake/or appetite?: No Does the patient have dental problems?: No Does the patient have eating habits or behaviors that may be indicators of an eating disorder including binging or inducing vomiting?: No Has the patient recently lost weight without trying?: 0 Has the patient been eating poorly because of a decreased appetite?: 0 Malnutrition Screening Tool Score: 0    Physical Exam  Physical Exam Vitals and nursing note reviewed.  Cardiovascular:     Rate and Rhythm: Normal rate and regular rhythm.     Pulses: Normal pulses.  Pulmonary:     Effort: Pulmonary effort is normal.     Breath sounds: Normal breath sounds.  Neurological:     Mental Status: She is alert.  Psychiatric:        Mood and Affect: Mood normal.        Behavior: Behavior normal.        Thought Content: Thought content normal.    Review of Systems  Constitutional: Negative.    Gastrointestinal: Negative.   Genitourinary: Negative.   Skin: Negative.   Psychiatric/Behavioral:  The patient has insomnia.   All other systems reviewed and are negative.  Blood pressure 135/61, pulse 96,  temperature 97.7 F (36.5 C), temperature source Oral, resp. rate 20, SpO2 99 %. There is no height or weight on file to calculate BMI.  Demographic Factors:  NA  Loss Factors: NA  Historical Factors: NA  Risk Reduction Factors:   Positive social support, Positive therapeutic relationship, and Positive coping skills or problem solving skills  Continued Clinical Symptoms:  Severe Anxiety and/or Agitation  Cognitive Features That Contribute To Risk:  Closed-mindedness    Suicide Risk:  Minimal: No identifiable suicidal ideation.  Patients presenting with no risk factors but with morbid ruminations; may be classified as minimal risk based on the severity of the depressive symptoms  Plan Of Care/Follow-up recommendations:  Activity:  as tolerated  Diet:  heart healthy   Patient was accepted to [facility] under the care of MD  Nelda Marseille   Disposition:  Take all medications as prescribed. Keep all follow-up appointments as scheduled.  Do not consume alcohol or use illegal drugs while on prescription medications. Report any adverse effects from your medications to your primary care provider promptly.  In the event of recurrent symptoms or worsening symptoms, call 911, a crisis hotline, or go to the nearest emergency department for evaluation.    Derrill Center, NP 04/08/2022, 11:18 AM

## 2022-04-08 NOTE — ED Notes (Addendum)
Pt back up to nurse's station, requesting medication because she cannot sleep and hasn't slept in 5 days. Cecilio Asper, NP made aware again. Awaiting orders.

## 2022-04-08 NOTE — ED Notes (Addendum)
Pt up to nurse's station asking, "what else can we do." Pt states she needs more medication. Cecilio Asper, NP and Roselyn Bering, NP made aware. No further orders received at this time.

## 2022-04-08 NOTE — BHH Group Notes (Signed)
Adult Psychoeducational Group Note  Date:  04/08/2022 Time:  9:36 PM  Group Topic/Focus:  Wrap-Up Group:   The focus of this group is to help patients review their daily goal of treatment and discuss progress on daily workbooks.  Participation Level:  Minimal  Participation Quality:  Appropriate and Attentive  Affect:  Appropriate  Cognitive:  Alert and Appropriate  Insight: Appropriate and Good  Engagement in Group:  Engaged  Modes of Intervention:  Discussion, Education, Orientation, and Support  Additional Comments:  Pt attended and participated in wrap up group this evening and rated their day a 2/10, due to them being "miserable". Pt states that they have not slept in days. While they are here pt would like to manage their meds.   Chrisandra Netters 04/08/2022, 9:36 PM

## 2022-04-08 NOTE — ED Notes (Signed)
Patient A&Ox4. Denies SI/HI when asked. Denies A/VH. Patient denies any physical complaints when asked. No acute distress noted. Support and encouragement provided. Routine safety checks conducted according to facility protocol. Encouraged patient to notify staff if thoughts of harm toward self or others arise. Patient verbalize understanding and agreement. Will continue to monitor for safety.     

## 2022-04-09 DIAGNOSIS — G2401 Drug induced subacute dyskinesia: Secondary | ICD-10-CM

## 2022-04-09 DIAGNOSIS — F250 Schizoaffective disorder, bipolar type: Principal | ICD-10-CM

## 2022-04-09 DIAGNOSIS — F411 Generalized anxiety disorder: Secondary | ICD-10-CM

## 2022-04-09 LAB — LIPID PANEL
Cholesterol: 237 mg/dL — ABNORMAL HIGH (ref 0–200)
HDL: 34 mg/dL — ABNORMAL LOW (ref >40)
LDL Cholesterol: 147 mg/dL — ABNORMAL HIGH (ref 0–99)
Total CHOL/HDL Ratio: 7 RATIO
Triglycerides: 278 mg/dL — ABNORMAL HIGH (ref <150)
VLDL: 56 mg/dL — ABNORMAL HIGH (ref 0–40)

## 2022-04-09 LAB — PROLACTIN: Prolactin: 17.2 ng/mL (ref 4.8–23.3)

## 2022-04-09 MED ORDER — FERROUS SULFATE 325 (65 FE) MG PO TABS
325.0000 mg | ORAL_TABLET | Freq: Every day | ORAL | Status: DC
  Administered 2022-04-10 – 2022-04-12 (×3): 325 mg via ORAL
  Filled 2022-04-09 (×5): qty 1

## 2022-04-09 MED ORDER — MELATONIN 5 MG PO TABS
10.0000 mg | ORAL_TABLET | Freq: Every day | ORAL | Status: DC
  Administered 2022-04-09 – 2022-04-11 (×3): 10 mg via ORAL
  Filled 2022-04-09 (×7): qty 2

## 2022-04-09 MED ORDER — HYDROCHLOROTHIAZIDE 25 MG PO TABS
25.0000 mg | ORAL_TABLET | Freq: Every day | ORAL | Status: DC
Start: 2022-04-09 — End: 2022-04-09

## 2022-04-09 MED ORDER — QUETIAPINE FUMARATE 100 MG PO TABS
100.0000 mg | ORAL_TABLET | Freq: Every day | ORAL | Status: DC
Start: 2022-04-09 — End: 2022-04-12
  Administered 2022-04-09 – 2022-04-11 (×3): 100 mg via ORAL
  Filled 2022-04-09 (×6): qty 1

## 2022-04-09 MED ORDER — CLONIDINE HCL 0.1 MG PO TABS
0.1000 mg | ORAL_TABLET | Freq: Every day | ORAL | Status: DC
  Administered 2022-04-09 – 2022-04-11 (×3): 0.1 mg via ORAL
  Filled 2022-04-09 (×6): qty 1

## 2022-04-09 NOTE — Progress Notes (Signed)
Psychoeducational Group Note  Date:  04/09/2022 Time:  2015  Group Topic/Focus:  Wrap up group  Participation Level: Did Not Attend  Participation Quality:  Not Applicable  Affect:  Not Applicable  Cognitive:  Not Applicable  Insight:  Not Applicable  Engagement in Group: Not Applicable  Additional Comments:  Did not attend.   Marcille Buffy 04/09/2022, 9:23 PM

## 2022-04-09 NOTE — [facility] Suicide Risk Assessment (Addendum)
Suicide Risk Assessment  Admission Assessment    [facility] Admission Suicide Risk Assessment   Nursing information obtained from:  Patient Demographic factors:  Low socioeconomic status, Unemployed Current Mental Status:  NA Loss Factors:  Financial problems / change in socioeconomic status Historical Factors:  NA Risk Reduction Factors:  Positive social support  Total Time spent with patient: 1.5 hours Principal Problem: Schizoaffective disorder, bipolar type (HCC) Diagnosis:  Principal Problem:   Schizoaffective disorder, bipolar type (HCC) Active Problems:   Anemia, iron deficiency   Insomnia   GAD (generalized anxiety disorder)   Tardive dyskinesia  History of Present Illness: Jody [name] is a 51 y.o African American female with prior diagnoses of bipolar d/o, schizophrenia, GAD & MDD who presented to the [facility] urgent [facility]) with her sister due to worsening insomnia & no sleep x 5-6 days. Pt had initially presented to the [facility] with same complaint on 6/26, & again at a [facility] on 7/6. On 7/6, she was prescribed Trazodone at the medical UC, took it for one night, prior to returning the following day to [facility], leading to this admission. Pt was transferred to this [facility] voluntarily for treatment & stabilization of her insomnia & mood medication.   Continued Clinical Symptoms: Pt reports insomnia x5-6 days, followed feeling down & depressed, feelings of hopelessness, helplessness, worthlessness, anxiety, low energy levels, trouble focusing, anhedonia, and decreased energy levels. She denies SI/HI/AVH or paranoia, but is in need of continuous hospitalizations to treat and stabilize symptoms listed above.   The "Alcohol Use Disorders Identification Test", Guidelines for Use in Primary Care, Second Edition.  World Science writer [facility]). Score between 0-7:  no or low risk or alcohol related problems. Score between 8-15:  moderate risk of alcohol  related problems. Score between 16-19:  high risk of alcohol related problems. Score 20 or above:  warrants further diagnostic evaluation for alcohol dependence and treatment.  CLINICAL FACTORS:   Depression:   Anhedonia Hopelessness Insomnia Severe   Musculoskeletal: Strength & Muscle Tone: within normal limits Gait & Station: normal Patient leans: N/A  Psychiatric Specialty Exam:  Presentation  General Appearance: Fairly Groomed; Appropriate for Environment  Eye Contact:Good  Speech:Clear and Coherent  Speech Volume:Normal  Handedness:Right   Mood and Affect  Mood:Euthymic  Affect:Congruent; Appropriate   Thought Process  Thought Processes:Coherent  Descriptions of Associations:Intact  Orientation:Full (Time, Place and Person)  Thought Content:Logical  History of Schizophrenia/Schizoaffective disorder:Yes  Duration of Psychotic Symptoms:No data recorded Hallucinations:Hallucinations: None  Ideas of Reference:None  Suicidal Thoughts:Suicidal Thoughts: No  Homicidal Thoughts:Homicidal Thoughts: No   Sensorium  Memory:Immediate Good  Judgment:Fair  Insight:Fair   Executive Functions  Concentration:Good  Attention Span:Good  Recall:Fair  Fund of Knowledge:Fair  Language:Fair   Psychomotor Activity  Psychomotor Activity:Psychomotor Activity: Extrapyramidal Side Effects (EPS) Extrapyramidal Side Effects (EPS): Tardive Dyskinesia AIMS Completed?: Yes   Assets  Assets:Communication Skills; Housing; Leisure Time   Sleep  Sleep:Sleep: Poor    Physical Exam: Physical Exam Constitutional:      Appearance: Normal appearance.  HENT:     Head: Normocephalic.     Nose: Nose normal.  Eyes:     Pupils: Pupils are equal, round, and reactive to light.  Musculoskeletal:        General: Normal range of motion.  Neurological:     Mental Status: She is alert and oriented to person, place, and time.  Psychiatric:        Thought  Content: Thought  content normal.    Review of Systems  Constitutional: Negative.   HENT: Negative.    Eyes: Negative.   Respiratory: Negative.    Cardiovascular: Negative.   Gastrointestinal: Negative.   Genitourinary: Negative.   Musculoskeletal: Negative.   Skin: Negative.   Neurological: Negative.   Psychiatric/Behavioral:  Positive for depression. Negative for hallucinations, memory loss, substance abuse and suicidal ideas. The patient is nervous/anxious and has insomnia.    Blood pressure 123/74, pulse 91, temperature 98.8 F (37.1 C), temperature source Oral, resp. rate 18, height 5\' 4"  (1.626 m), weight 82.6 kg, SpO2 93 %. Body mass index is 31.24 kg/m.   COGNITIVE FEATURES THAT CONTRIBUTE TO RISK:  None    SUICIDE RISK:  Mild: No SI on exam and denies previous attempts; some risk associated with schizoaffective - bipolar diagnosis and recent insomnia  Plan: See H&P  I certify that inpatient services furnished can reasonably be expected to improve the patient's condition.   , NP 04/09/2022, 4:12 PM

## 2022-04-09 NOTE — Group Note (Signed)
[facility] LCSW Group Therapy Note  Date/Time:  04/09/2022 10:00am-11:00am  Type of Therapy and Topic:  Group Therapy:  Healthy and Unhealthy Supports  Participation Level:  Minimal   Description of Group:  Patients in this group were introduced to the idea of adding a variety of healthy supports to address the various needs in their lives, especially in reference to their plans and focus for the new year.  Patients discussed what additional healthy supports could be helpful in their recovery and wellness after discharge in order to prevent future hospitalizations such as counselor, doctor, other levels of psychiatric care such as ACTT services, therapy groups, 12-step groups, and problem-specific support groups.  A demonstration was given about how to set boundaries which patients expressed was beneficial.  Several songs were played to inspire patients to be more self-supportive.  Therapeutic Goals:   1)  discuss importance of adding supports to stay well once out of the hospital  2)  compare healthy versus unhealthy supports and identify some examples of each  3)  generate ideas and descriptions of healthy supports that can be added  4)  offer mutual support about how to address unhealthy supports  5)  encourage active participation in and adherence to discharge plan    Summary of Patient Progress:  The patient stated that current healthy supports in her life are her children who inspire her -- she shared that they are all adults now.  The patient expressed that some people in her life who are unhealthy for her are negative friends.  She left group early and did not return..   Therapeutic Modalities:   Motivational Interviewing Brief Solution-Focused Therapy  Ambrose Mantle, LCSW

## 2022-04-09 NOTE — [facility] Group Notes (Signed)
Adult Psychoeducational Group Note Date:  04/09/2022 Time:  0900-1045 Group Topic/Focus: PROGRESSIVE RELAXATION. [name] group where deep breathing is taught and tensing and relaxation muscle groups is used. Imagery is used as well.  Pts are asked to imagine 3 pillars that hold them up when they are not able to hold themselves up and to share that with the group.  Participation Level:  did not attend  Jody [name]  

## 2022-04-09 NOTE — H&P (Signed)
Psychiatric Admission Assessment Adult  Patient Identification: Jody [name] MRN:  [id number] Date of Evaluation:  04/09/2022 Chief Complaint:  Bipolar affective disorder, current episode hypomanic (HCC) [F31.0] Principal Diagnosis: Schizoaffective disorder, bipolar type (HCC) Diagnosis:  Principal Problem:   Schizoaffective disorder, bipolar type (HCC) Active Problems:   Anemia, iron deficiency   Insomnia   GAD (generalized anxiety disorder)   Tardive dyskinesia  History of Present Illness: Jody [name] is a 51 y.o [place] American female with prior diagnoses of bipolar d/o, schizophrenia, GAD & MDD who presented to the [facility] urgent [facility]) with her sister due to worsening insomnia & no sleep x 5-6 days. Pt had initially presented to the [facility] with same complaint on 6/26, & again at a [facility] on 7/6. On 7/6, she was prescribed Trazodone at the medical UC, took it for one night, prior to returning the following day to [facility], leading to this admission. Pt was transferred to this [facility] voluntarily for treatment & stabilization of her insomnia & mood medication.   On assessment today, pt is able to collaborate the information above. She shares that she had not slept in 5 days prior to presenting to the medical urgent care, and her sister took her to the [facility] on 7/7 because Trazodone provided to her at the medical UC was ineffective. She reports that during the 5 days that she could not sleep, she had an unusual amount of energy, watched a lot of tv which is unlike her. Pt's verbal consent obtained and her sister Venia Minks) called at [phone]. Sister shared that pt typically loves food, and eats a lot, but did not have a lot of appetite or need for food in the week that she was sleepless. Pt reports that prior to presenting to the [facility], she started feeling, down & depressed, and had feelings of hopelessness, helplessness, worthlessness, anxiety, low  energy levels, trouble focusing, anhedonia, and her energy level decreased as well. She denies any suicide thoughts, denies HI/AVH or paranoia during that time. She reports that she did not want to go to the [facility], but her mother and sister urged her to go there.   Pt reports that she gets mental health services including medication management at [facility], and as per chart review, she last saw her MH provider on 6/23, and medications are in the process of being adjusted there. She denies any past suicide attempts, admits to one previous [facility] admission "many years ago", and denies any other inpatient mental health related hospitalizations. She reports past trials of Risperdal, but states that it caused Galactorrhea, and was discontinued. She reports that Ingrezza 40 mg daily was recently started at the [facility] for involuntary movements of her mouth, hands & feet. Her sister is able to collaborate this information, and also collaborate medications listed in her PTA medications list. UDS is + Benzodiazepines, but pt denies use of any Benzo medication. Pt denies any recreational substance use. She denies use of tobacco/ETOH as well.  Pt denies any history of emotional, sexual or physical trauma. She denies any history of head trauma/concussion, denies any history of self injurious type behaviors. She denies any current medical conditions, and reports an allergy to PCN. She reports that she currently lives alone in her apartment, and is on disability income. She reports that she is a triplet and her sisters are supportive. She denies any mental health conditions in her family.   Pt presents today with a euthymic  mood & affect is congruent.  Attention to personal hygiene and grooming is fair, eye contact is good, speech is clear & coherent. Thought contents are organized and logical, and pt currently denies SI/HI/AVH or paranoia. There is no evidence of delusional thoughts.  She reports that the last time she had  auditory hallucinations was 3 weeks ago. Pt is observed to have moderate involuntary movements to her mouth, jaws, fingers and also complains of involuntary movements of her feet. She is agreeable to some medication adjustments and we will continue her home medications as follows: Trazodone 50 mg nightly PRN for insomnia, Clonidine 0.1 mg nightly, Melatonin 10 mg nightly for insomnia, Ferrous sulfate 325 mg, Prozac 20 mg daily (reduced from 40 mg while at the [facility] mostly likely to prevent mania). We will hold Remeron for now, and will increase Seroquel to 100 mg nightly for mood stabilization.   Labs Reviewed: CMP, CBC completed. GFR is 47, CR slightly elevated at 1.38. TSH is elevated at 5.855. Free T3, Free T4 ordered & pending. Toxicology screen positive for Benzos, pt denies use. EKG with prolonged QTC (495). Will repeat tomorrow. Lipid panel ordered. Hemoglobin A1C WNL-5.6. Repeating CMP along with Vitamin D & B12 levels.   Associated Signs/Symptoms: Depression Symptoms:  depressed mood, anhedonia, insomnia, feelings of worthlessness/guilt, difficulty concentrating, hopelessness, anxiety, Duration of Depression Symptoms: Greater than two weeks  (Hypo) Manic Symptoms:   n/a Anxiety Symptoms:  Excessive Worry, Psychotic Symptoms:   n/a PTSD Symptoms: NA Total Time spent with patient: 1.5 hours  Past Psychiatric History: bipolar d/o, GAD  Is the patient at risk to self? No.  Has the patient been a risk to self in the past 6 months? No.  Has the patient been a risk to self within the distant past? No.  Is the patient a risk to others? No.  Has the patient been a risk to others in the past 6 months? No.  Has the patient been a risk to others within the distant past? No.   Prior Inpatient Therapy:   Prior Outpatient Therapy:    Alcohol Screening: 1. How often do you have a drink containing alcohol?: Never 2. How many drinks containing alcohol do you have on a typical day when you are  drinking?: 1 or 2 3. How often do you have six or more drinks on one occasion?: Never AUDIT-C Score: 0 Substance Abuse History in the last 12 months:  No. Consequences of Substance Abuse: NA Previous Psychotropic Medications: Yes  Psychological Evaluations: No  Past Medical History:  Past Medical History:  Diagnosis Date   Anxiety and depression    Bipolar disorder (HCC)    Cystitis, interstitial    Elevated troponin    a. 09/2015: CP/elevated troponin up to 0.11 - unclear significance. CT neg for PE, LHC neg for CAD, normal echo.   Microcytic anemia    Noted on labs   Panic attack    Paranoid schizophrenia (HCC)    Tobacco abuse     Past Surgical History:  Procedure Laterality Date   CARDIAC CATHETERIZATION N/A 09/29/2015   Procedure: Left Heart Cath and Coronary Angiography;  Surgeon: Corky Crafts, MD;  Location: [facility] INVASIVE CV LAB;  Service: Cardiovascular;  Laterality: N/A;   TUBAL LIGATION     Family History:  Family History  Problem Relation Age of Onset   Diabetes Father    Hypertension Mother    Hypertension Sister    Lupus Sister    Heart disease  Maternal Grandfather    Lupus Other    Family Psychiatric  History: none as per pt Tobacco Screening:   Social History:  Social History   Substance and Sexual Activity  Alcohol Use Not Currently   Alcohol/week: 0.0 standard drinks of alcohol   Comment: Quit 2017     Social History   Substance and Sexual Activity  Drug Use No    Additional Social History: Marital status: Divorced Divorced, when?: Several years What types of issues is patient dealing with in the relationship?: No contact Does patient have children?: Yes How many children?: 5 How is patient's relationship with their children?: Good relationship with all children, who are adults   Allergies:   Allergies  Allergen Reactions   Penicillins Itching and Rash   Lab Results:  Results for orders placed or performed during the hospital  encounter of 04/07/22 (from the past 48 hour(s))  Resp Panel by RT-PCR (Flu A&B, Covid) Anterior Nasal Swab     Status: None   Collection Time: 04/07/22  8:45 PM   Specimen: Anterior Nasal Swab  Result Value Ref Range   SARS Coronavirus 2 by RT PCR NEGATIVE NEGATIVE    Comment: (NOTE) SARS-CoV-2 target nucleic acids are NOT DETECTED.  The SARS-CoV-2 RNA is generally detectable in upper respiratory specimens during the acute phase of infection. The lowest concentration of SARS-CoV-2 viral copies this assay can detect is 138 copies/mL. A negative result does not preclude SARS-Cov-2 infection and should not be used as the sole basis for treatment or other patient management decisions. A negative result may occur with  improper specimen collection/handling, submission of specimen other than nasopharyngeal swab, presence of viral mutation(s) within the areas targeted by this assay, and inadequate number of viral copies(<138 copies/mL). A negative result must be combined with clinical observations, patient history, and epidemiological information. The expected result is Negative.  Fact Sheet for Patients:  BloggerCourse.com  Fact Sheet for Healthcare Providers:  SeriousBroker.it  This test is no t yet approved or cleared by the [place] FDA and  has been authorized for detection and/or diagnosis of SARS-CoV-2 by FDA under an Emergency Use Authorization (EUA). This EUA will remain  in effect (meaning this test can be used) for the duration of the COVID-19 declaration under Section 564(b)(1) of the Act, 21 U.S.C.section 360bbb-3(b)(1), unless the authorization is terminated  or revoked sooner.       Influenza A by PCR NEGATIVE NEGATIVE   Influenza B by PCR NEGATIVE NEGATIVE    Comment: (NOTE) The Xpert Xpress SARS-CoV-2/FLU/RSV plus assay is intended as an aid in the diagnosis of influenza from Nasopharyngeal swab specimens  and should not be used as a sole basis for treatment. Nasal washings and aspirates are unacceptable for Xpert Xpress SARS-CoV-2/FLU/RSV testing.  Fact Sheet for Patients: BloggerCourse.com  Fact Sheet for Healthcare Providers: SeriousBroker.it  This test is not yet approved or cleared by the [place] FDA and has been authorized for detection and/or diagnosis of SARS-CoV-2 by FDA under an Emergency Use Authorization (EUA). This EUA will remain in effect (meaning this test can be used) for the duration of the COVID-19 declaration under Section 564(b)(1) of the Act, 21 U.S.C. section 360bbb-3(b)(1), unless the authorization is terminated or revoked.  Performed at [facility], [address], [place], [place] [address]   CBC with Differential/Platelet     Status: Abnormal   Collection Time: 04/07/22  8:45 PM  Result Value Ref Range   WBC 7.7  4.0 - 10.5 K/uL   RBC 5.45 (H) 3.87 - 5.11 MIL/uL   Hemoglobin 12.9 12.0 - 15.0 g/dL   HCT 16.139.5 09.636.0 - 04.546.0 %   MCV 72.5 (L) 80.0 - 100.0 fL   MCH 23.7 (L) 26.0 - 34.0 pg   MCHC 32.7 30.0 - 36.0 g/dL   RDW 40.914.6 81.111.5 - 91.415.5 %   Platelets 350 150 - 400 K/uL   nRBC 0.0 0.0 - 0.2 %   Neutrophils Relative % 39 %   Neutro Abs 3.1 1.7 - 7.7 K/uL   Lymphocytes Relative 55 %   Lymphs Abs 4.2 (H) 0.7 - 4.0 K/uL   Monocytes Relative 5 %   Monocytes Absolute 0.4 0.1 - 1.0 K/uL   Eosinophils Relative 1 %   Eosinophils Absolute 0.1 0.0 - 0.5 K/uL   Basophils Relative 0 %   Basophils Absolute 0.0 0.0 - 0.1 K/uL   Immature Granulocytes 0 %   Abs Immature Granulocytes 0.02 0.00 - 0.07 K/uL    Comment: Performed at [facility], [address] [place], [place] [address]  Comprehensive metabolic panel     Status: Abnormal   Collection Time: 04/07/22  8:45 PM  Result Value Ref Range   Sodium 140 135 - 145 mmol/L   Potassium 3.8 3.5 - 5.1 mmol/L   Chloride 107 98 - 111 mmol/L   CO2  19 (L) 22 - 32 mmol/L   Glucose, Bld 92 70 - 99 mg/dL    Comment: Glucose reference range applies only to samples taken after fasting for at least 8 hours.   BUN 9 6 - 20 mg/dL   Creatinine, Ser 6.211.38 (H) 0.44 - 1.00 mg/dL   Calcium 30.810.1 8.9 - 65.710.3 mg/dL   Total Protein 6.8 6.5 - 8.1 g/dL   Albumin 4.5 3.5 - 5.0 g/dL   AST 34 15 - 41 U/L   ALT 37 0 - 44 U/L   Alkaline Phosphatase 60 38 - 126 U/L   Total Bilirubin 0.6 0.3 - 1.2 mg/dL   GFR, Estimated 47 (L) >60 mL/min    Comment: (NOTE) Calculated using the CKD-EPI Creatinine Equation (2021)    Anion gap 14 5 - 15    Comment: Performed at [facility], [address] [place], [place] [address]  Hemoglobin A1c     Status: None   Collection Time: 04/07/22  8:45 PM  Result Value Ref Range   Hgb A1c MFr Bld 5.6 4.8 - 5.6 %    Comment: (NOTE) Pre diabetes:          5.7%-6.4%  Diabetes:              >6.4%  Glycemic control for   <7.0% adults with diabetes    Mean Plasma Glucose 114.02 mg/dL    Comment: Performed at [facility], [address] [place], [place] [address]  Magnesium     Status: None   Collection Time: 04/07/22  8:45 PM  Result Value Ref Range   Magnesium 2.1 1.7 - 2.4 mg/dL    Comment: Performed at [facility], [address] [place], [place] [address]  Ethanol     Status: None   Collection Time: 04/07/22  8:45 PM  Result Value Ref Range   Alcohol, Ethyl (B) <10 <10 mg/dL    Comment: (NOTE) Lowest detectable limit for serum alcohol is 10 mg/dL.  For medical purposes only. Performed at [facility], [address] [place], [place] [address]   TSH     Status: Abnormal   Collection Time: 04/07/22  8:45 PM  Result Value Ref Range   TSH 5.855 (H) 0.350 - 4.500 uIU/mL    Comment: Performed by a 3rd Generation assay with a functional sensitivity of <=0.01 uIU/mL. Performed at [facility], [address] [place], [place] [address]   POCT Urine Drug Screen - (I-Screen)      Status: Abnormal   Collection Time: 04/07/22  8:45 PM  Result Value Ref Range   POC Amphetamine UR None Detected NONE DETECTED (Cut Off Level 1000 ng/mL)   POC Secobarbital (BAR) None Detected NONE DETECTED (Cut Off Level 300 ng/mL)   POC Buprenorphine (BUP) None Detected NONE DETECTED (Cut Off Level 10 ng/mL)   POC Oxazepam (BZO) Positive (A) NONE DETECTED (Cut Off Level 300 ng/mL)   POC Cocaine UR None Detected NONE DETECTED (Cut Off Level 300 ng/mL)   POC Methamphetamine UR None Detected NONE DETECTED (Cut Off Level 1000 ng/mL)   POC Morphine None Detected NONE DETECTED (Cut Off Level 300 ng/mL)   POC Methadone UR None Detected NONE DETECTED (Cut Off Level 300 ng/mL)   POC Oxycodone UR None Detected NONE DETECTED (Cut Off Level 100 ng/mL)   POC Marijuana UR None Detected NONE DETECTED (Cut Off Level 50 ng/mL)  Pregnancy, urine POC     Status: None   Collection Time: 04/07/22  8:53 PM  Result Value Ref Range   Preg Test, Ur NEGATIVE NEGATIVE    Comment:        THE SENSITIVITY OF THIS METHODOLOGY IS >24 mIU/mL   POC SARS Coronavirus 2 Ag     Status: None   Collection Time: 04/07/22  8:59 PM  Result Value Ref Range   SARSCOV2ONAVIRUS 2 AG NEGATIVE NEGATIVE    Comment: (NOTE) SARS-CoV-2 antigen NOT DETECTED.   Negative results are presumptive.  Negative results do not preclude SARS-CoV-2 infection and should not be used as the sole basis for treatment or other patient management decisions, including infection  control decisions, particularly in the presence of clinical signs and  symptoms consistent with COVID-19, or in those who have been in contact with the virus.  Negative results must be combined with clinical observations, patient history, and epidemiological information. The expected result is Negative.  Fact Sheet for Patients: https://www.jennings-kim.com/  Fact Sheet for Healthcare Providers: https://alexander-rogers.biz/  This test is not yet  approved or cleared by the [place] FDA and  has been authorized for detection and/or diagnosis of SARS-CoV-2 by FDA under an Emergency Use Authorization (EUA).  This EUA will remain in effect (meaning this test can be used) for the duration of  the COV ID-19 declaration under Section 564(b)(1) of the Act, 21 U.S.C. section 360bbb-3(b)(1), unless the authorization is terminated or revoked sooner.      Blood Alcohol level:  Lab Results  Component Value Date   ETH <10 04/07/2022   ETH <10 01/30/2022    Metabolic Disorder Labs:  Lab Results  Component Value Date   HGBA1C 5.6 04/07/2022   MPG 114.02 04/07/2022   MPG 117 09/28/2015   Lab Results  Component Value Date   PROLACTIN 155.0 (H) 12/28/2021   Lab Results  Component Value Date   CHOL 228 (H) 12/28/2021   TRIG 104 12/28/2021   HDL 40 12/28/2021   CHOLHDL 5.7 (H) 12/28/2021   VLDL 62 (H) 09/29/2015   LDLCALC 169 (H) 12/28/2021  LDLCALC 151 (H) 11/15/2020    Current Medications: Current Facility-Administered Medications  Medication Dose Route Frequency Provider Last Rate Last Admin   acetaminophen (TYLENOL) tablet 650 mg  650 mg Oral Q6H PRN Oneta Rack, NP       alum & mag hydroxide-simeth (MAALOX/MYLANTA) 200-200-20 MG/5ML suspension 30 mL  30 mL Oral Q4H PRN Oneta Rack, NP       cloNIDine (CATAPRES) tablet 0.1 mg  0.1 mg Oral QHS Starleen Blue, NP       [START ON 04/10/2022] ferrous sulfate tablet 325 mg  325 mg Oral Q breakfast Estle Sabella, NP       FLUoxetine (PROZAC) capsule 20 mg  20 mg Oral Daily Oneta Rack, NP   20 mg at 04/09/22 0949   magnesium hydroxide (MILK OF MAGNESIA) suspension 30 mL  30 mL Oral Daily PRN Oneta Rack, NP       melatonin tablet 10 mg  10 mg Oral QHS Maelyn Berrey, NP       QUEtiapine (SEROQUEL) tablet 100 mg  100 mg Oral QHS Paitlyn Mcclatchey, NP       traZODone (DESYREL) tablet 100 mg  100 mg Oral QHS PRN Ajibola, Ene A, NP   100 mg at 04/08/22 2134    valbenazine (INGREZZA) capsule 40 mg  40 mg Oral Daily Oneta Rack, NP   40 mg at 04/09/22 0949   PTA Medications: Medications Prior to Admission  Medication Sig Dispense Refill Last Dose   amantadine (SYMMETREL) 100 MG capsule Take 100 mg by mouth daily.      cloNIDine (CATAPRES) 0.1 MG tablet Take 0.1 mg by mouth at bedtime.      ferrous gluconate (FERGON) 324 MG tablet TAKE 1 TABLET BY MOUTH EVERY MORNING WITH BREAKFAST 28 tablet 2    FLUoxetine (PROZAC) 40 MG capsule Take 40 mg by mouth daily.      hydrochlorothiazide (HYDRODIURIL) 25 MG tablet TAKE 1 TABLET BY MOUTH DAILY 28 tablet 1    MELATONIN PO Take 10 mg by mouth at bedtime.      mirtazapine (REMERON) 7.5 MG tablet Take 7.5 mg by mouth at bedtime.      QUEtiapine (SEROQUEL) 100 MG tablet Take 100 mg by mouth at bedtime.      trimethoprim (TRIMPEX) 100 MG tablet Take 100 mg by mouth at bedtime.      ziprasidone (GEODON) 20 MG capsule Take 20 mg by mouth 2 (two) times daily with a meal. Currently ordered bid, per meds dispensed by [employer]      Musculoskeletal: Strength & Muscle Tone: within normal limits Gait & Station: normal Patient leans: N/A  Psychiatric Specialty Exam:  Presentation  General Appearance: Fairly Groomed; Appropriate for Environment  Eye Contact:Good  Speech:Clear and Coherent  Speech Volume:Normal  Handedness:Right  Mood and Affect  Mood:Euthymic  Affect:Congruent; Appropriate  Thought Process  Thought Processes:Coherent  Duration of Psychotic Symptoms: No data recorded Past Diagnosis of Schizophrenia or Psychoactive disorder: Yes  Descriptions of Associations:Intact  Orientation:Full (Time, Place and Person)  Thought Content:Logical  Hallucinations:Hallucinations: None  Ideas of Reference:None  Suicidal Thoughts:Suicidal Thoughts: No  Homicidal Thoughts:Homicidal Thoughts: No  Sensorium  Memory:Immediate Good  Judgment:Fair  Insight:Fair   Executive  Functions  Concentration:Good  Attention Span:Good  Recall:Fair  Fund of Knowledge:Fair  Language:Fair   Psychomotor Activity  Psychomotor Activity:Psychomotor Activity: Extrapyramidal Side Effects (EPS) Extrapyramidal Side Effects (EPS): Tardive Dyskinesia AIMS Completed?: Yes Current AIMS-16  Assets  Assets:Communication Skills;  Housing; Leisure Time  Sleep  Sleep:Sleep: Poor  Physical Exam: Physical Exam Constitutional:      Appearance: Normal appearance.  HENT:     Head: Normocephalic.     Nose: Nose normal.  Eyes:     Pupils: Pupils are equal, round, and reactive to light.  Cardiovascular:     Rate and Rhythm: Normal rate.  Pulmonary:     Effort: Pulmonary effort is normal.  Musculoskeletal:     Cervical back: Normal range of motion.  Neurological:     Mental Status: She is alert and oriented to person, place, and time.  Psychiatric:        Behavior: Behavior normal.        Thought Content: Thought content normal.    Review of Systems  Constitutional: Negative.   HENT: Negative.    Eyes: Negative.   Respiratory: Negative.    Cardiovascular: Negative.   Gastrointestinal: Negative.   Genitourinary: Negative.   Musculoskeletal: Negative.   Skin: Negative.   Neurological: Negative.   Psychiatric/Behavioral:  Positive for depression. Negative for hallucinations, memory loss, substance abuse and suicidal ideas. The patient is nervous/anxious and has insomnia.    Blood pressure (!) 119/91, pulse 88, temperature 98.5 F (36.9 C), temperature source Oral, resp. rate 18, height 5\' 4"  (1.626 m), weight 82.6 kg, SpO2 100 %. Body mass index is 31.24 kg/m.  Treatment Plan Summary: Daily contact with patient to assess and evaluate symptoms and progress in treatment and Medication management  Observation Level/Precautions:  15 minute checks  Laboratory:  Labs reviewed   Psychotherapy:  Unit Group sessions  Medications:  See [facility]  Consultations:  To be  determined   Discharge Concerns:  Safety, medication compliance, mood stability  Estimated LOS: 5-7 days  Other:  N/A   Physician Treatment Plan for Primary Diagnosis: Schizoaffective disorder, bipolar type (HCC)  PLAN Safety and Monitoring: Voluntary admission to inpatient psychiatric unit for safety, stabilization and treatment Daily contact with patient to assess and evaluate symptoms and progress in treatment Patient's case to be discussed in multi-disciplinary team meeting Observation Level : q15 minute checks Vital signs: q12 hours Precautions: Safety     Long Term Goal(s): Improvement in symptoms so as ready for discharge  Short Term Goals: Ability to verbalize feelings will improve, Ability to disclose and discuss suicidal ideas, Ability to identify and develop effective coping behaviors will improve, and Compliance with prescribed medications will improve           Diagnoses Principal Problem:   Schizoaffective disorder, bipolar type (HCC) Active Problems:   Anemia, iron deficiency   Insomnia   GAD (generalized anxiety disorder)   Tardive dyskinesia                       Medications -Continue Prozac 20 mg daily for depressive symptoms -Increase Seroquel to 100 mg nightly for mood stabilization/insomnia -Continue Trazodone 100 mg  nightly PRN -Continue Ingrezza 40 mg daily for Tardive dyskinesia -Continue Clonidine 0.1 mg for htn -Continue Ferrous sulfate 325 mg daily for iron replacement -Continue Melatonin 10 mg nightly for insomnia -Holding Remeron due to risk of mania  Other PRNS -Discontinue Hydroxyzine 25 mg PRN due to TD -Continue Tylenol 650 mg every 6 hours PRN for mild pain -Continue Maalox 30 mg every 4 hrs PRN for indigestion -Continue Milk of Magnesia as needed every 6 hrs for constipation  Discharge Planning: Social work and case management to assist with discharge planning and identification  of hospital follow-up needs prior to discharge Estimated  LOS: 5-7 days Discharge Concerns: Need to establish a safety plan; Medication compliance and effectiveness Discharge Goals: Return home with outpatient referrals for mental health follow-up including medication management/psychotherapy  I certify that inpatient services furnished can reasonably be expected to improve the patient's condition.    Starleen Blue, NP 7/9/20234:05 PM

## 2022-04-09 NOTE — Progress Notes (Signed)
D. Pt presents with an anxious affect, brightens upon approach,has been calm and cooperative,and friendly during interactions.. Per pt's self inventory, pt rated her depression,hopelessness and anxiety a 2/0/2, respectively, and reported that she slept well last night, endorsed having a good appetite, normal energy level and good concentration. Pt reported that her goal was to get her medications in order and stay positive.  Pt denied pain, SI/HI and AVH and does not appear to be responding to internal stimuli.  A. Labs and vitals monitored. Pt given and educated on medications. Pt supported emotionally and encouraged to express concerns and ask questions.   R. Pt remains safe with 15 minute checks. Will continue POC.
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
## 2022-04-09 NOTE — BHH Group Notes (Signed)
Adult Psychoeducational Group  Date:  04/02/2022 Time:  1300-1400  Group Topic/Focus: Continuation of the group from Saturday. Looking at the lists that were created and talking about what needs to be done with the homework of 30 positives about themselves.                                     Talking about taking their power back and helping themselves to develop a positive self esteem.      Participation Quality:  did not attend  Patrizia Paule A  

## 2022-04-10 ENCOUNTER — Encounter (HOSPITAL_COMMUNITY): Payer: Self-pay

## 2022-04-10 LAB — COMPREHENSIVE METABOLIC PANEL
ALT: 29 U/L (ref 0–44)
AST: 23 U/L (ref 15–41)
Albumin: 4.2 g/dL (ref 3.5–5.0)
Alkaline Phosphatase: 64 U/L (ref 38–126)
Anion gap: 7 (ref 5–15)
BUN: 10 mg/dL (ref 6–20)
CO2: 23 mmol/L (ref 22–32)
Calcium: 9.4 mg/dL (ref 8.9–10.3)
Chloride: 111 mmol/L (ref 98–111)
Creatinine, Ser: 0.94 mg/dL (ref 0.44–1.00)
GFR, Estimated: >60 mL/min (ref >60)
Glucose, Bld: 104 mg/dL — ABNORMAL HIGH (ref 70–99)
Potassium: 4.3 mmol/L (ref 3.5–5.1)
Sodium: 141 mmol/L (ref 135–145)
Total Bilirubin: 0.5 mg/dL (ref 0.3–1.2)
Total Protein: 6.9 g/dL (ref 6.5–8.1)

## 2022-04-10 LAB — VITAMIN D 25 HYDROXY (VIT D DEFICIENCY, FRACTURES): Vit D, 25-Hydroxy: 23.35 ng/mL — ABNORMAL LOW (ref 30–100)

## 2022-04-10 LAB — VITAMIN B12: Vitamin B-12: 256 pg/mL (ref 180–914)

## 2022-04-10 LAB — T4, FREE: Free T4: 0.95 ng/dL (ref 0.61–1.12)

## 2022-04-10 MED ORDER — MIRTAZAPINE 15 MG PO TABS
15.0000 mg | ORAL_TABLET | Freq: Every day | ORAL | Status: DC
  Administered 2022-04-10 – 2022-04-11 (×2): 15 mg via ORAL
  Filled 2022-04-10 (×4): qty 1

## 2022-04-10 MED ORDER — HYDROCHLOROTHIAZIDE 25 MG PO TABS
25.0000 mg | ORAL_TABLET | Freq: Every day | ORAL | Status: DC
  Administered 2022-04-10 – 2022-04-12 (×3): 25 mg via ORAL
  Filled 2022-04-10 (×7): qty 1

## 2022-04-10 NOTE — Plan of Care (Addendum)
I spoke to [employer] [phone] who reports that patient was last seen 03/29/22 and has diagnosis of schizoaffective d/o depressed type and alcohol dependence in remission. Patient's most recent med list is as follows:   Remeron 15mg  qhs (earlier script for 7.5mg  qhs) - unclear if were combined Amantadine 100mg  qd Prozac 40mg  qd Clonidine 0.1mg  qhs Melatonin 10mg  qhs Seroquel 100mg  qhs  Vistaril 25mg  tid and 50mg  qhs PRN  She is sending an email to the nurse to return my call to get more details about last encounter and for coordination of care.   , MD, FAPA    Nurse from [place] returned my call - She confirms above med list and states Remeron was 15mg  dose. She confirmed diagnosis and states there is no mention of oral TD in last note.   , MD, 

## 2022-04-10 NOTE — Group Note (Signed)
Recreation Therapy Group Note   Group Topic:Stress Management  Group Date: 04/10/2022 Start Time: 0935 End Time: 0950 Facilitators: Caroll Rancher, [place] Location: [address] Dayroom   Goal Area(s) Addresses:  Patient will identify positive stress management techniques. Patient will identify benefits of using stress management post d/c.     Group Description:  Meditation.  LRT played a meditation that focused on finding inner peace in various situations.  Patients were to listen and follow as meditation played to fully engage in the meditation.  Patients were also encouraged to explore other avenues of finding stress management such as Youtube, Apps, etc.    Affect/Mood: N/A   Participation Level: Did not attend    Clinical Observations/Individualized Feedback:     Plan: Continue to engage patient in RT group sessions 2-3x/week.   Caroll Rancher, LRT,CTRS 04/10/2022 12:30 PM

## 2022-04-10 NOTE — Progress Notes (Signed)
Pt is pleasant. Reports her sleep has been improving. Pt rates depression 1/10 and anxiety 1/10. Pt reports a good appetite, and no physical problems. Pt denies SI/HI/AVH and verbally contracts for safety. Provided support and encouragement. Pt safe on the unit. Q 15 minute safety checks continued.

## 2022-04-10 NOTE — BHH Suicide Risk Assessment (Addendum)
BHH INPATIENT:  Family/Significant Other Suicide Prevention Education  Suicide Prevention Education:  Education Completed; Jody [name] [phone] (Sister/Payee) has been identified by the patient as the family member/significant other with whom the patient will be residing, and identified as the person(s) who will aid the patient in the event of a mental health crisis (suicidal ideations/suicide attempt).  With written consent from the patient, the family member/significant other has been provided the following suicide prevention education, prior to the and/or following the discharge of the patient.  The suicide prevention education provided includes the following: Suicide risk factors Suicide prevention and interventions National Suicide Hotline telephone number [facility] assessment telephone number [facility] Emergency Assistance 911 [facility] and/or Residential Mobile Crisis Unit telephone number  Request made of family/significant other to: Remove weapons (e.g., guns, rifles, knives), all items previously/currently identified as safety concern.   Remove drugs/medications (over-the-counter, prescriptions, illicit drugs), all items previously/currently identified as a safety concern.  The family member/significant other verbalizes understanding of the suicide prevention education information provided.  The family member/significant other agrees to remove the items of safety concern listed above.  CSW spoke with Mrs. Glendening who states that her sister has not been sleeping and has been experiencing shaking in her body.  She states that her sister also has Anxiety and she does not believe that her sister's medication are working well.  She states that her sister goes to all of her appointments and takes all of her medications as prescribed.  She states that her sister's symptoms have been getting worse for the past 2 weeks after her doctor increased her medications at  the last appointment.    Mrs. Stensland states that she use to be her sister's Legal Guardian but that more recently her sister was able to return to court and get her own guardianship back.  She states that she is still her sister's Payee but that her sister makes her own purchases and that she only monitors it at this time.  Mrs. Benda states that her sister is living alone now and has been doing well on her own.  She states that her sister still continues to view her as the Legal Guardian and comes to her often for questions and assistance.    Mrs. Mayford Knife confirms that her sister will be returning to her own home after discharge and that there are no firearms or weapons in the home.  She states that she will continue to be a support for her sister after discharge.  CSW completed SPE with Mrs. Williams.   Jody [name] 04/10/2022, 11:02 AM

## 2022-04-10 NOTE — BH IP Treatment Plan (Signed)
Interdisciplinary Treatment and Diagnostic Plan Update  04/10/2022 Time of Session: 12pm Jody [name] MRN: [id number]  Principal Diagnosis: Schizoaffective disorder, depressive type [facility])  Secondary Diagnoses: Principal Problem:   Schizoaffective disorder, depressive type ([place]) Active Problems:   Anemia, iron deficiency   Insomnia   GAD (generalized anxiety disorder)   Tardive dyskinesia   Current Medications:  Current Facility-Administered Medications  Medication Dose Route Frequency Provider Last Rate Last Admin   acetaminophen (TYLENOL) tablet 650 mg  650 mg Oral Q6H PRN Derrill Center, NP       alum & mag hydroxide-simeth (MAALOX/MYLANTA) 200-200-20 MG/5ML suspension 30 mL  30 mL Oral Q4H PRN Derrill Center, NP       cloNIDine (CATAPRES) tablet 0.1 mg  0.1 mg Oral QHS Nkwenti, Doris, NP   0.1 mg at 04/09/22 2137   ferrous sulfate tablet 325 mg  325 mg Oral Q breakfast Nicholes Rough, NP   325 mg at 04/10/22 0755   FLUoxetine (PROZAC) capsule 20 mg  20 mg Oral Daily Derrill Center, NP   20 mg at 04/10/22 0755   hydrochlorothiazide (HYDRODIURIL) tablet 25 mg  25 mg Oral Daily Singleton, [name] E, MD       magnesium hydroxide (MILK OF MAGNESIA) suspension 30 mL  30 mL Oral Daily PRN Derrill Center, NP       melatonin tablet 10 mg  10 mg Oral QHS Nkwenti, Doris, NP   10 mg at 04/09/22 2137   mirtazapine (REMERON) tablet 15 mg  15 mg Oral QHS Nkwenti, Doris, NP       QUEtiapine (SEROQUEL) tablet 100 mg  100 mg Oral QHS Nkwenti, Doris, NP   100 mg at 04/09/22 2137   valbenazine (INGREZZA) capsule 40 mg  40 mg Oral Daily Derrill Center, NP   40 mg at 04/10/22 0755   PTA Medications: Medications Prior to Admission  Medication Sig Dispense Refill Last Dose   amantadine (SYMMETREL) 100 MG capsule Take 100 mg by mouth daily.      cloNIDine (CATAPRES) 0.1 MG tablet Take 0.1 mg by mouth at bedtime.      ferrous gluconate (FERGON) 324 MG tablet TAKE 1 TABLET BY MOUTH EVERY MORNING  WITH BREAKFAST 28 tablet 2    FLUoxetine (PROZAC) 40 MG capsule Take 40 mg by mouth daily.      hydrochlorothiazide (HYDRODIURIL) 25 MG tablet TAKE 1 TABLET BY MOUTH DAILY 28 tablet 1    MELATONIN PO Take 10 mg by mouth at bedtime.      mirtazapine (REMERON) 7.5 MG tablet Take 7.5 mg by mouth at bedtime.      QUEtiapine (SEROQUEL) 100 MG tablet Take 100 mg by mouth at bedtime.      trimethoprim (TRIMPEX) 100 MG tablet Take 100 mg by mouth at bedtime.      ziprasidone (GEODON) 20 MG capsule Take 20 mg by mouth 2 (two) times daily with a meal. Currently ordered bid, per meds dispensed by [employer]       Patient Stressors: Financial difficulties   Health problems    Patient Strengths: Ability for insight  Capable of independent living  Motivation for treatment/growth  Supportive family/friends   Treatment Modalities: Medication Management, Group therapy, Case management,  1 to 1 session with clinician, Psychoeducation, Recreational therapy.   Physician Treatment Plan for Primary Diagnosis: Schizoaffective disorder, depressive type ([place]) Long Term Goal(s): Improvement in symptoms so as ready for discharge   Short Term Goals: Ability to  verbalize feelings will improve Ability to disclose and discuss suicidal ideas Ability to identify and develop effective coping behaviors will improve Compliance with prescribed medications will improve  Medication Management: Evaluate patient's response, side effects, and tolerance of medication regimen.  Therapeutic Interventions: 1 to 1 sessions, Unit Group sessions and Medication administration.  Evaluation of Outcomes: Not Met  Physician Treatment Plan for Secondary Diagnosis: Principal Problem:   Schizoaffective disorder, depressive type ([place]) Active Problems:   Anemia, iron deficiency   Insomnia   GAD (generalized anxiety disorder)   Tardive dyskinesia  Long Term Goal(s): Improvement in symptoms so as ready for discharge   Short  Term Goals: Ability to verbalize feelings will improve Ability to disclose and discuss suicidal ideas Ability to identify and develop effective coping behaviors will improve Compliance with prescribed medications will improve     Medication Management: Evaluate patient's response, side effects, and tolerance of medication regimen.  Therapeutic Interventions: 1 to 1 sessions, Unit Group sessions and Medication administration.  Evaluation of Outcomes: Not Met   RN Treatment Plan for Primary Diagnosis: Schizoaffective disorder, depressive type ([place]) Long Term Goal(s): Knowledge of disease and therapeutic regimen to maintain health will improve  Short Term Goals: Ability to remain free from injury will improve, Ability to verbalize frustration and anger appropriately will improve, Ability to demonstrate self-control, Ability to participate in decision making will improve, Ability to verbalize feelings will improve, Ability to disclose and discuss suicidal ideas, Ability to identify and develop effective coping behaviors will improve, and Compliance with prescribed medications will improve  Medication Management: RN will administer medications as ordered by provider, will assess and evaluate patient's response and provide education to patient for prescribed medication. RN will report any adverse and/or side effects to prescribing provider.  Therapeutic Interventions: 1 on 1 counseling sessions, Psychoeducation, Medication administration, Evaluate responses to treatment, Monitor vital signs and CBGs as ordered, Perform/monitor CIWA, COWS, AIMS and Fall Risk screenings as ordered, Perform wound care treatments as ordered.  Evaluation of Outcomes: Not Met   LCSW Treatment Plan for Primary Diagnosis: Schizoaffective disorder, depressive type ([place]) Long Term Goal(s): Safe transition to appropriate next level of care at discharge, Engage patient in therapeutic group addressing interpersonal  concerns.  Short Term Goals: Engage patient in aftercare planning with referrals and resources, Increase social support, Increase ability to appropriately verbalize feelings, Increase emotional regulation, Facilitate acceptance of mental health diagnosis and concerns, Facilitate patient progression through stages of change regarding substance use diagnoses and concerns, Identify triggers associated with mental health/substance abuse issues, and Increase skills for wellness and recovery  Therapeutic Interventions: Assess for all discharge needs, 1 to 1 time with Social worker, Explore available resources and support systems, Assess for adequacy in community support network, Educate family and significant other(s) on suicide prevention, Complete Psychosocial Assessment, Interpersonal group therapy.  Evaluation of Outcomes: Not Met   Progress in Treatment: Attending groups: Yes. Participating in groups: Yes. Taking medication as prescribed: Yes. Toleration medication: Yes. Family/Significant other contact made: Yes, individual(s) contacted:  sister Patient understands diagnosis: No. Discussing patient identified problems/goals with staff: Yes. Medical problems stabilized or resolved: Yes. Denies suicidal/homicidal ideation: Yes. Issues/concerns per patient self-inventory: No.  New problem(s) identified: No, Describe:  none reported   New Short Term/Long Term Goal(s):   medication stabilization, elimination of SI thoughts, development of comprehensive mental wellness plan.    Patient Goals:  Pt states, "I would like to work on sleep and getting on the right meds."   Discharge  Plan or Barriers: Patient recently admitted. CSW will continue to follow and assess for appropriate referrals and possible discharge planning.    Reason for Continuation of Hospitalization: Anxiety Depression Hallucinations Medical Issues Medication stabilization  Estimated Length of Stay: 5-7 days  Last 3  [place] Suicide Severity Risk Score: [place] Admission (Current) from 04/08/2022 in [facility] from 04/07/2022 in [facility] ED from 04/06/2022 in [facility] [place] No Risk No Risk No Risk       Last PHQ 2/9 Scores:    01/30/2022    3:00 PM 12/28/2021   11:24 AM 05/27/2021    9:28 AM  Depression screen PHQ 2/9  Decreased Interest '1 1 1  ' Down, Depressed, Hopeless '2 2 1  ' PHQ - 2 Score '3 3 2  ' Altered sleeping '1 3 1  ' Tired, decreased energy '1 1 1  ' Change in appetite 1 0 1  Feeling bad or failure about yourself  1 0 1  Trouble concentrating '3 3 1  ' Moving slowly or fidgety/restless '1 1 2  ' Suicidal thoughts 1 1 0  PHQ-9 Score '12 12 9    ' Scribe for Treatment Team: Zachery Conch, LCSW 04/10/2022 3:59 PM

## 2022-04-10 NOTE — Progress Notes (Signed)
Psychoeducational Group Note  Date:  04/10/2022 Time:  2116  Group Topic/Focus:  Wrap-Up Group:   The focus of this group is to help patients review their daily goal of treatment and discuss progress on daily workbooks.  Participation Level: Did Not Attend  Participation Quality:  Not Applicable  Affect:  Not Applicable  Cognitive:  Not Applicable  Insight:  Not Applicable  Engagement in Group: Not Applicable  Additional Comments:  The patient did not attend group this evening.   Hazle Coca S 04/10/2022, 9:16 PM

## 2022-04-10 NOTE — Progress Notes (Signed)
Repeat EKG completed, results given to Leslie Dales, PMHNP.

## 2022-04-10 NOTE — Progress Notes (Signed)
BHH Group Notes:  (Nursing/MHT/Case Management/Adjunct)   Date:  04/10/22 Time:  1000   Type of Therapy:   Goals group   Participation Level:  Not active   Participation Quality:  Did not attend   Affect:  Did not attend  Cognitive:  Did not attend   Insight:  Did not attend   Engagement in Group:  Did not attend   Modes of Intervention:  Did not attend   Summary of Progress/Problems: Did not attend   Sherrita Riederer  RN 04/10/22 1030 

## 2022-04-10 NOTE — Group Note (Signed)
LCSW Group Therapy Note   Group Date: 04/10/2022 Start Time: 1300 End Time: 1400  Type of Therapy and Topic:  Group Therapy:  Strengths Exploration   Participation Level: Did Not Attend  Description of Group: This group allows individuals to explore their strengths, learn to use strengths in new ways to improve well-being. Strengths-based interventions involve identifying strengths, understanding how they are used, and learning new ways to apply them. Individuals will identify their strengths, and then explore their roles in different areas of life (relationships, professional life, and personal fulfillment). Individuals will think about ways in which they currently use their strengths, along with new ways they could begin using them.    Therapeutic Goals Patient will verbalize two of their strengths Patient will identify how their strengths are currently used Patient will identify two new ways to apply their strengths  Patients will create a plan to apply their strengths in their daily lives     Summary of Patient Progress:  Did not attend      Therapeutic Modalities Cognitive Behavioral Therapy Motivational Interviewing  Axavier Pressley M Aloys Hupfer, LCSWA 04/10/2022  1:56 PM    

## 2022-04-10 NOTE — Plan of Care (Signed)
  Problem: Education: Goal: Knowledge of [facility] Education information/materials will improve Outcome: Progressing Goal: Emotional status will improve Outcome: Progressing Goal: Mental status will improve Outcome: Progressing Goal: Verbalization of understanding the information provided will improve Outcome: Progressing   Problem: Activity: Goal: Interest or engagement in leisure activities will improve Outcome: Progressing Goal: Imbalance in normal sleep/wake cycle will improve Outcome: Progressing   Problem: Coping: Goal: Coping ability will improve Outcome: Progressing Goal: Will verbalize feelings Outcome: Progressing   Problem: Coping: Goal: Ability to identify and develop effective coping behavior will improve Outcome: Progressing   Problem: Self-Concept: Goal: Ability to identify factors that promote anxiety will improve Outcome: Progressing Goal: Level of anxiety will decrease Outcome: Progressing Goal: Ability to modify response to factors that promote anxiety will improve Outcome: Progressing   Problem: Health Behavior/Discharge Planning: Goal: Identification of resources available to assist in meeting health care needs will improve Outcome: Progressing   Problem: Self-Concept: Goal: Will verbalize positive feelings about self Outcome: Progressing

## 2022-04-10 NOTE — Progress Notes (Signed)
Pt did not go to any groups today and stayed in her room for dinner. Encouraged pt to get out of her room and mingle with peers. Pt said okay. HCTZ 25mg  given for BP, will recheck.

## 2022-04-10 NOTE — Progress Notes (Signed)
   04/10/22 2200  Psych Admission Type (Psych Patients Only)  Admission Status Voluntary  Psychosocial Assessment  Patient Complaints Anxiety  Eye Contact Fair  Facial Expression Anxious  Affect Appropriate to circumstance  Speech Logical/coherent  Interaction Assertive  Motor Activity Slow  Appearance/Hygiene Unremarkable  Behavior Characteristics Appropriate to situation  Mood Anxious  Aggressive Behavior  Effect No apparent injury  Thought Process  Coherency WDL  Content WDL  Delusions None reported or observed  Perception WDL  Hallucination None reported or observed  Judgment Impaired  Confusion None  Danger to Self  Current suicidal ideation? Denies  Danger to Others  Danger to Others None reported or observed

## 2022-04-10 NOTE — Progress Notes (Signed)
Pt BP 132/80 pulse 85 sitting, right arm, manual BP, not on the dynamap.

## 2022-04-10 NOTE — Progress Notes (Addendum)
[facility] MD Progress Note  04/10/2022 3:30 PM Jody [name]  MRN:  [id number]  Reason For Admission: Jody [name] is a 51 y.o [place] American female with prior diagnoses of bipolar d/o, schizophrenia, GAD & MDD who presented to the [facility] urgent care [facility]) with her sister due to worsening insomnia & no sleep x 5-6 days. Pt had initially presented to the [facility] with same complaint on 6/26, & again at a [facility] medical UC on 7/6. On 7/6, she was prescribed Trazodone at the medical UC, took it for one night, prior to returning the following day to [facility], leading to this admission. Pt was transferred to this [facility] voluntarily for treatment & stabilization of her insomnia & mood medication.   24 hr Chart Review: Pt's chart reviewed, her case discussed with her treatment team. As per nursing staff, pt slept for a total of 8 hrs last night. She required PRN Trazodone 100 mg last night for insomnia. Over the past 24 hrs, she has been compliant with scheduled medications, and has been calm & cooperative with care, attended some unit group sessions, and has denied SI/HI/AVH/paranoia.  Today's patient assessment note: Today, pt is seen in her room on the 400 hall with attending psychiatrist. She presents with a euthymic mood, and affect is congruent and appropriate. Attention to personal hygiene and grooming is fair, eye contact is good, speech is clear & coherent. Thought contents are organized and logical, and pt currently denies SI/HI/AVH or paranoia. There is no evidence of delusional thoughts. She reports that she has not had any auditory hallucinations in 3-4 weeks. She continues to have moderate amounts of involuntary movements of her jaws and mouth, but involuntary movements of the hands and feet are improving & less noticeable. She reports that the involuntary movements of her mouth, hands and feet started when she stopped sleeping at night, and reports that she slept for  approximately 5 hrs last night.   She reports a good appetite, and denies being in any physical distress. She denies nausea/vomiting/diarrhea, and states that she is moving her bowels well. As per records, she has been on Aricept in the past for Alzheimer's, but states that she does not currently take this medication. Repeat EKG today continues to show prolonged QT. Will stop Trazodone at this time -We will restart remeron 15mg  nightly for insomnia and depression. PRN trazodone discontinued for now unless QTC is under 500 - if QTC looks better can consider restarting PRN Vistaril and PRN Trazodone. We will keep prozac at 20mg  for now in case it was activating pt while she was on 40mg .  BP earlier in the morning was 132/105 and nursing has been asked to recheck BP. We will continue to monitor.  Principal Problem: Schizoaffective disorder, depressive type (HCC) Diagnosis: Principal Problem:   Schizoaffective disorder, depressive type (HCC) Active Problems:   Anemia, iron deficiency   Insomnia   GAD (generalized anxiety disorder)   Tardive dyskinesia  Total Time spent with patient: 30 minutes  Past Psychiatric History: as above  Past Medical History:  Past Medical History:  Diagnosis Date   Anxiety and depression    Bipolar disorder (HCC)    Cystitis, interstitial    Elevated troponin    a. 09/2015: CP/elevated troponin up to 0.11 - unclear significance. CT neg for PE, LHC neg for CAD, normal echo.   Microcytic anemia    Noted on labs   Panic attack    Paranoid schizophrenia (HCC)  Tobacco abuse     Past Surgical History:  Procedure Laterality Date   CARDIAC CATHETERIZATION N/A 09/29/2015   Procedure: Left Heart Cath and Coronary Angiography;  Surgeon: Corky Crafts, MD;  Location: [facility] INVASIVE CV LAB;  Service: Cardiovascular;  Laterality: N/A;   TUBAL LIGATION     Family History:  Family History  Problem Relation Age of Onset   Diabetes Father    Hypertension Mother     Hypertension Sister    Lupus Sister    Heart disease Maternal Grandfather    Lupus Other    Family Psychiatric  History: none reported Social History:  Social History   Substance and Sexual Activity  Alcohol Use Not Currently   Alcohol/week: 0.0 standard drinks of alcohol   Comment: Quit 2017     Social History   Substance and Sexual Activity  Drug Use No    Social History   Socioeconomic History   Marital status: Single    Spouse name: Not on file   Number of children: 5   Years of education: 12   Highest education level: Not on file  Occupational History   Occupation: Disabled  Tobacco Use   Smoking status: Former    Packs/day: 0.25    Years: 4.00    Total pack years: 1.00    Types: Cigarettes    Quit date: 05/01/2016    Years since quitting: 5.9   Smokeless tobacco: Never  Vaping Use   Vaping Use: Never used  Substance and Sexual Activity   Alcohol use: Not Currently    Alcohol/week: 0.0 standard drinks of alcohol    Comment: Quit 2017   Drug use: No   Sexual activity: Not Currently    Birth control/protection: Condom  Other Topics Concern   Not on file  Social History Narrative   Lives alone   Caffeine use: Soda- daily   Right-handed   Social Determinants of Health   Financial Resource Strain: Not on file  Food Insecurity: Not on file  Transportation Needs: Not on file  Physical Activity: Not on file  Stress: Not on file  Social Connections: Not on file   Additional Social History:   Sleep: Good  Appetite:  Good  Current Medications: Current Facility-Administered Medications  Medication Dose Route Frequency Provider Last Rate Last Admin   acetaminophen (TYLENOL) tablet 650 mg  650 mg Oral Q6H PRN Oneta Rack, NP       alum & mag hydroxide-simeth (MAALOX/MYLANTA) 200-200-20 MG/5ML suspension 30 mL  30 mL Oral Q4H PRN Oneta Rack, NP       cloNIDine (CATAPRES) tablet 0.1 mg  0.1 mg Oral QHS Nkwenti, Doris, NP   0.1 mg at 04/09/22 2137    ferrous sulfate tablet 325 mg  325 mg Oral Q breakfast Starleen Blue, NP   325 mg at 04/10/22 0755   FLUoxetine (PROZAC) capsule 20 mg  20 mg Oral Daily Oneta Rack, NP   20 mg at 04/10/22 0755   magnesium hydroxide (MILK OF MAGNESIA) suspension 30 mL  30 mL Oral Daily PRN Oneta Rack, NP       melatonin tablet 10 mg  10 mg Oral QHS Nkwenti, Doris, NP   10 mg at 04/09/22 2137   mirtazapine (REMERON) tablet 15 mg  15 mg Oral QHS Nkwenti, Doris, NP       QUEtiapine (SEROQUEL) tablet 100 mg  100 mg Oral QHS Nkwenti, Doris, NP   100 mg at 04/09/22 2137  valbenazine (INGREZZA) capsule 40 mg  40 mg Oral Daily Oneta Rack, NP   40 mg at 04/10/22 1610    Lab Results:  Results for orders placed or performed during the hospital encounter of 04/08/22 (from the past 48 hour(s))  Lipid panel     Status: Abnormal   Collection Time: 04/09/22  6:22 PM  Result Value Ref Range   Cholesterol 237 (H) 0 - 200 mg/dL   Triglycerides 960 (H) <150 mg/dL   HDL 34 (L) >45 mg/dL   Total CHOL/HDL Ratio 7.0 RATIO   VLDL 56 (H) 0 - 40 mg/dL   LDL Cholesterol 409 (H) 0 - 99 mg/dL    Comment:        Total Cholesterol/HDL:CHD Risk Coronary Heart Disease Risk Table                     Men   Women  1/2 Average Risk   3.4   3.3  Average Risk       5.0   4.4  2 X Average Risk   9.6   7.1  3 X Average Risk  23.4   11.0        Use the calculated Patient Ratio above and the CHD Risk Table to determine the patient's CHD Risk.        ATP III CLASSIFICATION (LDL):  <100     mg/dL   Optimal  811-914  mg/dL   Near or Above                    Optimal  130-159  mg/dL   Borderline  782-956  mg/dL   High  >213     mg/dL   Very High Performed at [facility], [address], [place], [place] [address]   T4, free     Status: None   Collection Time: 04/09/22  6:22 PM  Result Value Ref Range   Free T4 0.95 0.61 - 1.12 ng/dL    Comment: (NOTE) Biotin ingestion may interfere with free T4  tests. If the results are inconsistent with the TSH level, previous test results, or the clinical presentation, then consider biotin interference. If needed, order repeat testing after stopping biotin. Performed at [facility], [address], [place], [place] [address]   Vitamin B12     Status: None   Collection Time: 04/09/22  6:22 PM  Result Value Ref Range   Vitamin B-12 256 180 - 914 pg/mL    Comment: (NOTE) This assay is not validated for testing neonatal or myeloproliferative syndrome specimens for Vitamin B12 levels. Performed at [facility], [address], [place], [place] [address]   Comprehensive metabolic panel     Status: Abnormal   Collection Time: 04/10/22  6:32 AM  Result Value Ref Range   Sodium 141 135 - 145 mmol/L   Potassium 4.3 3.5 - 5.1 mmol/L   Chloride 111 98 - 111 mmol/L   CO2 23 22 - 32 mmol/L   Glucose, Bld 104 (H) 70 - 99 mg/dL    Comment: Glucose reference range applies only to samples taken after fasting for at least 8 hours.   BUN 10 6 - 20 mg/dL   Creatinine, Ser 4.13 0.44 - 1.00 mg/dL   Calcium 9.4 8.9 - 24.4 mg/dL   Total Protein 6.9 6.5 - 8.1 g/dL   Albumin 4.2 3.5 - 5.0 g/dL   AST 23 15 -  41 U/L   ALT 29 0 - 44 U/L   Alkaline Phosphatase 64 38 - 126 U/L   Total Bilirubin 0.5 0.3 - 1.2 mg/dL   GFR, Estimated >16>60 >10>60 mL/min    Comment: (NOTE) Calculated using the CKD-EPI Creatinine Equation (2021)    Anion gap 7 5 - 15    Comment: Performed at [facility], [address] [place], [place] [address]  VITAMIN D 25 Hydroxy (Vit-D Deficiency, Fractures)     Status: Abnormal   Collection Time: 04/10/22  6:32 AM  Result Value Ref Range   Vit D, 25-Hydroxy 23.35 (L) 30 - 100 ng/mL    Comment: (NOTE) Vitamin D deficiency has been defined by the Institute of Medicine  and an Endocrine Society practice guideline as a level of serum 25-OH  vitamin D less than 20 ng/mL (1,2). The Endocrine Society  went on to  further define vitamin D insufficiency as a level between 21 and 29  ng/mL (2).  1. IOM (Institute of Medicine). 2010. Dietary reference intakes for  calcium and D. [place]: [employer] Academ[employer] Press. 2. Holick MF, Binkley [place], Bischoff-Ferrari HA, et al. Evaluation,  treatment, and prevention of vitamin D deficiency: an Endocrine  Society clinical practice guideline, JCEM. 2011 Jul; 96(7): 1911-30.  Performed at [facility], [address] [place], [place] [address]     Blood Alcohol level:  Lab Results  Component Value Date   [facility] 04/07/2022   ETH <10 01/30/2022    Metabolic Disorder Labs: Lab Results  Component Value Date   HGBA1C 5.6 04/07/2022   MPG 114.02 04/07/2022   MPG 117 09/28/2015   Lab Results  Component Value Date   PROLACTIN 17.2 04/07/2022   PROLACTIN 155.0 (H) 12/28/2021   Lab Results  Component Value Date   CHOL 237 (H) 04/09/2022   TRIG 278 (H) 04/09/2022   HDL 34 (L) 04/09/2022   CHOLHDL 7.0 04/09/2022   VLDL 56 (H) 04/09/2022   LDLCALC 147 (H) 04/09/2022   LDLCALC 169 (H) 12/28/2021    Physical Findings: AIMS: Facial and [place] Movements Muscles of Facial Expression: Mild Lips and Perioral Area: Minimal Jaw: Moderate Tongue: Minimal,Extremity Movements Upper (arms, wrists, hands, fingers): Minimal Lower (legs, knees, ankles, toes): Minimal, Trunk Movements Neck, shoulders, hips: None, normal, Overall Severity Severity of abnormal movements (highest score from questions above): Minimal Incapacitation due to abnormal movements: None, normal Patient's awareness of abnormal movements (rate only patient's report): Aware, no distress, Dental Status Current problems with teeth and/or dentures?: No Does patient usually wear dentures?: No  CIWA:   n/a COWS:   n/a AIMS:11  Musculoskeletal: Strength & Muscle Tone: within normal limits Gait & Station: normal Patient leans: N/A  Psychiatric Specialty  Exam:  Presentation  General Appearance: Appropriate for Environment; Fairly Groomed  Eye Contact:Good  Speech:Clear and Coherent  Speech Volume:Normal  Handedness:Right   Mood and Affect  Mood:Euthymic  Affect:Appropriate; Congruent   Thought Process  Thought Processes:Coherent  Descriptions of Associations:Intact  Orientation:Full (Time, Place and Person)  Thought Content:Logical  History of Schizophrenia/Schizoaffective disorder:Yes  Duration of Psychotic Symptoms:No data recorded Hallucinations:Hallucinations: None  Ideas of Reference:None  Suicidal Thoughts:Suicidal Thoughts: No  Homicidal Thoughts:Homicidal Thoughts: No  Sensorium  Memory:Immediate Good  Judgment:Fair  Insight:Fair  Executive Functions  Concentration:Good  Attention Span:Good  Recall:Good  Fund of Knowledge:Good  Language:Good  Psychomotor Activity  Psychomotor Activity:Psychomotor Activity: Extrapyramidal Side Effects (EPS) Extrapyramidal Side Effects (EPS): Tardive Dyskinesia AIMS Completed?:  Yes  Assets  Assets:Communication Skills  Sleep  Sleep:Sleep: Fair  Physical Exam: Physical Exam Constitutional:      Appearance: Normal appearance.  HENT:     Head: Normocephalic.     Nose: Nose normal. No congestion or rhinorrhea.  Eyes:     Pupils: Pupils are equal, round, and reactive to light.  Pulmonary:     Effort: Pulmonary effort is normal. No respiratory distress.  Musculoskeletal:     Cervical back: Normal range of motion.  Neurological:     Mental Status: She is alert and oriented to person, place, and time.     Sensory: No sensory deficit.     Coordination: Coordination normal.  Psychiatric:        Behavior: Behavior normal.        Thought Content: Thought content normal.    Review of Systems  Constitutional: Negative.  Negative for fever.  HENT: Negative.    Eyes: Negative.   Respiratory: Negative.    Cardiovascular: Negative.    Gastrointestinal: Negative.   Genitourinary: Negative.   Musculoskeletal: Negative.   Skin: Negative.   Neurological: Negative.   Psychiatric/Behavioral:  Positive for depression (Denies SI/HI/AVH). Negative for hallucinations, memory loss, substance abuse and suicidal ideas. The patient has insomnia. The patient is not nervous/anxious.    Blood pressure (!) 132/105, pulse 76, temperature 98.6 F (37 C), temperature source Oral, resp. rate 20, height 5\' 4"  (1.626 m), weight 82.6 kg, SpO2 98 %. Body mass index is 31.24 kg/m. Treatment Plan Summary: Daily contact with patient to assess and evaluate symptoms and progress in treatment and Medication management   Observation Level/Precautions:  15 minute checks  Laboratory:  Labs reviewed   Psychotherapy:  Unit Group sessions  Medications:  See [facility]  Consultations:  To be determined   Discharge Concerns:  Safety, medication compliance, mood stability  Estimated LOS: 5-7 days  Other:  N/A    Physician Treatment Plan for Primary Diagnosis: Schizoaffective disorder, bipolar type (HCC)   PLAN Safety and Monitoring: Voluntary admission to inpatient psychiatric unit for safety, stabilization and treatment Daily contact with patient to assess and evaluate symptoms and progress in treatment Patient's case to be discussed in multi-disciplinary team meeting Observation Level : q15 minute checks Vital signs: q12 hours Precautions: Safety      Long Term Goal(s): Improvement in symptoms so as ready for discharge   Short Term Goals: Ability to verbalize feelings will improve, Ability to disclose and discuss suicidal ideas, Ability to identify and develop effective coping behaviors will improve, and Compliance with prescribed medications will improve            Diagnoses Principal Problem:   Schizoaffective disorder, depressive type (HCC) - confirmed diagnosis with [facility] Active Problems:   Anemia, iron deficiency   Insomnia   GAD  (generalized anxiety disorder)   Tardive dyskinesia Elevated TSH                        Medications -Restart Remeron 15 mg nightly for insomnia/depression -Continue Prozac 20 mg daily for depressive symptoms (consider dose titration back to 40mg  over time - holding at lower dose in case was activated at higher dose) -Continue Seroquel 100 mg nightly for mood stabilization/insomnia - monitoring QTC -Discontinue Trazodone 100 mg nightly PRN d/t QT prolongation risk -Continue Ingrezza 40 mg daily for Tardive dyskinesia -Continue Clonidine 0.1 mg (questionably for sleep vs nightmares vs BP) -Continue Ferrous sulfate 325 mg daily for iron replacement -  Continue Melatonin 10 mg nightly for insomnia -Discontinued Hydroxyzine 25 mg PRN due to TD & QT prolongation - Restarting HCTZ home dose and monitoring creatinine  Other PRNS -Continue Tylenol 650 mg every 6 hours PRN for mild pain -Continue Maalox 30 mg every 4 hrs PRN for indigestion -Continue Milk of Magnesia as needed every 6 hrs for constipation  Elevated TSH (r/o subclinical hypothyroidism) - TSH 5.855 and FT4 0.95 and FT3 pending - Will need f/u with PCP after discharge  Hyperlipidemia - Nonfasting lab - will need dietary changes and fasting lab with PCP after discharge with monitoirng while on atyical antipsychotic  Elevated creatinine - resolved - Repeat creatinine 0.94  HTN - Continue Clonidine 0.1mg  qhs - Restarting HCTZ home dose and monitoring creatinine   Discharge Planning: Social work and case management to assist with discharge planning and identification of hospital follow-up needs prior to discharge Estimated LOS: 5-7 days Discharge Concerns: Need to establish a safety plan; Medication compliance and effectiveness Discharge Goals: Return home with outpatient referrals for mental health follow-up including medication management/psychotherapy     Starleen Blue, NP 04/10/2022, 3:30 PM

## 2022-04-11 DIAGNOSIS — G309 Alzheimer's disease, unspecified: Secondary | ICD-10-CM

## 2022-04-11 LAB — T3, FREE: T3, Free: 3 pg/mL (ref 2.0–4.4)

## 2022-04-11 MED ORDER — VITAMIN D (ERGOCALCIFEROL) 1.25 MG (50000 UNIT) PO CAPS
50000.0000 [IU] | ORAL_CAPSULE | ORAL | Status: DC
  Administered 2022-04-12: 50000 [IU] via ORAL
  Filled 2022-04-11: qty 1

## 2022-04-11 MED ORDER — DONEPEZIL HCL 5 MG PO TABS
5.0000 mg | ORAL_TABLET | Freq: Every day | ORAL | Status: DC
  Administered 2022-04-11: 5 mg via ORAL
  Filled 2022-04-11 (×4): qty 1

## 2022-04-11 NOTE — Progress Notes (Signed)
   04/11/22 1700  Psych Admission Type (Psych Patients Only)  Admission Status Voluntary  Psychosocial Assessment  Patient Complaints Sleep disturbance  Eye Contact Fair  Facial Expression Anxious  Affect Appropriate to circumstance  Speech Logical/coherent  Interaction Assertive  Motor Activity Other (Comment) (WDL)  Appearance/Hygiene Unremarkable  Behavior Characteristics Appropriate to situation;Cooperative  Mood Anxious  Thought Process  Coherency WDL  Content WDL  Delusions None reported or observed  Perception WDL  Hallucination None reported or observed  Judgment Impaired  Confusion None  Danger to Self  Current suicidal ideation? Denies  Danger to Others  Danger to Others None reported or observed

## 2022-04-11 NOTE — Progress Notes (Signed)
[facility] MD Progress Note  04/11/2022 5:41 PM Jody [name]  MRN:  [id number]  Reason For Admission: Jody [name] is a 51 y.o [place] American female with prior diagnoses of bipolar d/o, schizophrenia, GAD & MDD who presented to the [facility] urgent [facility] [address]) with her sister due to worsening insomnia & no sleep x 5-6 days. Pt had initially presented to the [facility] with same complaint on 6/26, & again at a [facility] UC on 7/6. On 7/6, she was prescribed Trazodone at the medical UC, took it for one night, prior to returning the following day to [facility], leading to this admission. Pt was transferred to this [facility] voluntarily for treatment & stabilization of her insomnia & mood medication.   24 hr Chart Review: Pt's chart reviewed, her case discussed with her treatment team. As per nursing staff, pt slept for a total of 8.5 hrs last night. Trazodone was discontinued yesterday due to QT prolongation. Over the past 24 hrs, she has been compliant with scheduled medications, and has been calm & cooperative with care, attended some unit group sessions, and has denied SI/HI/AVH and denied paranoia.   Today's patient assessment note: Today, pt is seen in her room on the 400 hall. She continues to present with a euthymic mood, and affect is congruent and appropriate. Attention to personal hygiene and grooming is fair, eye contact is good, speech is clear & coherent. Thought contents continue to be organized and logical, and pt currently continues to deny SI/HI/AVH or paranoia. There continues to be no evidence of delusional thoughts. She continues to report that she has not had any auditory hallucinations in 3-4 weeks. The involuntary movements of her jaws and mouth, is minimal & less noticeable. AIMS today :4.  She continues to report a good appetite, and denies being in any physical distress. She continues to deny nausea/vomiting/diarrhea, and states that she is moving her bowels well. As  per records, she has been on Aricept in the past for Alzheimer's, but states that she does not currently take this medication. Today, a MOCA was administered, and pt's total score was 20/30 rendering her mildly cognitively impaired. She reports that she has trouble remembering street names that she knew before, has trouble remembering birthdays which she previously knew, and has trouble remembering important anniversaries. She reports that this is bothersome to her. With her consent, writer again placed a call to her sister Jody [name]) at [phone] to collaborate this information. She was able to collaborate, and reports that there is no history of dementia in her paternal family, but reports that her mother is adopted and she is not sure if there is a history of dementia in that side of the family. Pt educated on the benefits & rationales of restarting Aricept at a lower dose of 5 mg nightly, and following up outpatient with her neurologist. She is agreeable to this plan. Will start this medication and continue others as listed below. Pt has been educated that discharge will be tomorrow and her sister is agreeable to picking her up tomorrow at 12 pm.  Principal Problem: Schizoaffective disorder, depressive type (HCC) Diagnosis: Principal Problem:   Schizoaffective disorder, depressive type (HCC) Active Problems:   Anemia, iron deficiency   Insomnia   GAD (generalized anxiety disorder)   Tardive dyskinesia   Major neurocognitive disorder, due to Alzheimer's disease, without behavioral disturbance, mild (HCC)  Total Time spent with patient: 30 minutes  Past Psychiatric History: as above  Past Medical History:  Past Medical History:  Diagnosis Date   Anxiety and depression    Bipolar disorder (HCC)    Cystitis, interstitial    Elevated troponin    a. 09/2015: CP/elevated troponin up to 0.11 - unclear significance. CT neg for PE, LHC neg for CAD, normal echo.   Microcytic anemia    Noted on  labs   Panic attack    Paranoid schizophrenia (HCC)    Tobacco abuse     Past Surgical History:  Procedure Laterality Date   CARDIAC CATHETERIZATION N/A 09/29/2015   Procedure: Left Heart Cath and Coronary Angiography;  Surgeon: Corky Crafts, MD;  Location: [facility] INVASIVE CV LAB;  Service: Cardiovascular;  Laterality: N/A;   TUBAL LIGATION     Family History:  Family History  Problem Relation Age of Onset   Diabetes Father    Hypertension Mother    Hypertension Sister    Lupus Sister    Heart disease Maternal Grandfather    Lupus Other    Family Psychiatric  History: none reported Social History:  Social History   Substance and Sexual Activity  Alcohol Use Not Currently   Alcohol/week: 0.0 standard drinks of alcohol   Comment: Quit 2017     Social History   Substance and Sexual Activity  Drug Use No    Social History   Socioeconomic History   Marital status: Single    Spouse name: Not on file   Number of children: 5   Years of education: 12   Highest education level: Not on file  Occupational History   Occupation: Disabled  Tobacco Use   Smoking status: Former    Packs/day: 0.25    Years: 4.00    Total pack years: 1.00    Types: Cigarettes    Quit date: 05/01/2016    Years since quitting: 5.9   Smokeless tobacco: Never  Vaping Use   Vaping Use: Never used  Substance and Sexual Activity   Alcohol use: Not Currently    Alcohol/week: 0.0 standard drinks of alcohol    Comment: Quit 2017   Drug use: No   Sexual activity: Not Currently    Birth control/protection: Condom  Other Topics Concern   Not on file  Social History Narrative   Lives alone   Caffeine use: Soda- daily   Right-handed   Social Determinants of Health   Financial Resource Strain: Not on file  Food Insecurity: Not on file  Transportation Needs: Not on file  Physical Activity: Not on file  Stress: Not on file  Social Connections: Not on file   Additional Social History:    Sleep: Good  Appetite:  Good  Current Medications: Current Facility-Administered Medications  Medication Dose Route Frequency Provider Last Rate Last Admin   acetaminophen (TYLENOL) tablet 650 mg  650 mg Oral Q6H PRN Oneta Rack, NP       alum & mag hydroxide-simeth (MAALOX/MYLANTA) 200-200-20 MG/5ML suspension 30 mL  30 mL Oral Q4H PRN Oneta Rack, NP       cloNIDine (CATAPRES) tablet 0.1 mg  0.1 mg Oral QHS Arleen Bar, NP   0.1 mg at 04/10/22 2112   donepezil (ARICEPT) tablet 5 mg  5 mg Oral QHS Jamis Kryder, NP       ferrous sulfate tablet 325 mg  325 mg Oral Q breakfast Quanna Wittke, NP   325 mg at 04/11/22 0821   FLUoxetine (PROZAC) capsule 20 mg  20 mg Oral Daily Lewis,  Jerene Pitch, NP   20 mg at 04/11/22 4742   hydrochlorothiazide (HYDRODIURIL) tablet 25 mg  25 mg Oral Daily Comer Locket, MD   25 mg at 04/11/22 5956   magnesium hydroxide (MILK OF MAGNESIA) suspension 30 mL  30 mL Oral Daily PRN Oneta Rack, NP       melatonin tablet 10 mg  10 mg Oral QHS Starleen Blue, NP   10 mg at 04/10/22 2112   mirtazapine (REMERON) tablet 15 mg  15 mg Oral QHS Starleen Blue, NP   15 mg at 04/10/22 2112   QUEtiapine (SEROQUEL) tablet 100 mg  100 mg Oral QHS Starleen Blue, NP   100 mg at 04/10/22 2112   valbenazine (INGREZZA) capsule 40 mg  40 mg Oral Daily Oneta Rack, NP   40 mg at 04/11/22 3875   Vitamin D (Ergocalciferol) (DRISDOL) capsule 50,000 Units  50,000 Units Oral Q7 days Starleen Blue, NP        Lab Results:  Results for orders placed or performed during the hospital encounter of 04/08/22 (from the past 48 hour(s))  Lipid panel     Status: Abnormal   Collection Time: 04/09/22  6:22 PM  Result Value Ref Range   Cholesterol 237 (H) 0 - 200 mg/dL   Triglycerides 643 (H) <150 mg/dL   HDL 34 (L) >32 mg/dL   Total CHOL/HDL Ratio 7.0 RATIO   VLDL 56 (H) 0 - 40 mg/dL   LDL Cholesterol 951 (H) 0 - 99 mg/dL    Comment:        Total Cholesterol/HDL:CHD  Risk Coronary Heart Disease Risk Table                     Men   Women  1/2 Average Risk   3.4   3.3  Average Risk       5.0   4.4  2 X Average Risk   9.6   7.1  3 X Average Risk  23.4   11.0        Use the calculated Patient Ratio above and the CHD Risk Table to determine the patient's CHD Risk.        ATP III CLASSIFICATION (LDL):  <100     mg/dL   Optimal  884-166  mg/dL   Near or Above                    Optimal  130-159  mg/dL   Borderline  063-016  mg/dL   High  >010     mg/dL   Very High Performed at [facility], [address], [place], [place] [address]   T4, free     Status: None   Collection Time: 04/09/22  6:22 PM  Result Value Ref Range   Free T4 0.95 0.61 - 1.12 ng/dL    Comment: (NOTE) Biotin ingestion may interfere with free T4 tests. If the results are inconsistent with the TSH level, previous test results, or the clinical presentation, then consider biotin interference. If needed, order repeat testing after stopping biotin. Performed at [facility], [address] [place], [place] [address]   T3, free     Status: None   Collection Time: 04/09/22  6:22 PM  Result Value Ref Range   T3, Free 3.0 2.0 - 4.4 pg/mL    Comment: (NOTE) Performed At: [facility] [address] [place], [place] [id number] Jolene Schimke MD BJ:[id number]  Vitamin B12     Status: None   Collection Time: 04/09/22  6:22 PM  Result Value Ref Range   Vitamin B-12 256 180 - 914 pg/mL    Comment: (NOTE) This assay is not validated for testing neonatal or myeloproliferative syndrome specimens for Vitamin B12 levels. Performed at [facility], [address] [place], [place] [address]   Comprehensive metabolic panel     Status: Abnormal   Collection Time: 04/10/22  6:32 AM  Result Value Ref Range   Sodium 141 135 - 145 mmol/L   Potassium 4.3 3.5 - 5.1 mmol/L   Chloride 111 98 - 111 mmol/L   CO2 23 22 - 32 mmol/L    Glucose, Bld 104 (H) 70 - 99 mg/dL    Comment: Glucose reference range applies only to samples taken after fasting for at least 8 hours.   BUN 10 6 - 20 mg/dL   Creatinine, Ser 6.040.94 0.44 - 1.00 mg/dL   Calcium 9.4 8.9 - 54.010.3 mg/dL   Total Protein 6.9 6.5 - 8.1 g/dL   Albumin 4.2 3.5 - 5.0 g/dL   AST 23 15 - 41 U/L   ALT 29 0 - 44 U/L   Alkaline Phosphatase 64 38 - 126 U/L   Total Bilirubin 0.5 0.3 - 1.2 mg/dL   GFR, Estimated >98>60 >11>60 mL/min    Comment: (NOTE) Calculated using the CKD-EPI Creatinine Equation (2021)    Anion gap 7 5 - 15    Comment: Performed at [facility], [address] [place], [place] [address]  VITAMIN D 25 Hydroxy (Vit-D Deficiency, Fractures)     Status: Abnormal   Collection Time: 04/10/22  6:32 AM  Result Value Ref Range   Vit D, 25-Hydroxy 23.35 (L) 30 - 100 ng/mL    Comment: (NOTE) Vitamin D deficiency has been defined by the Institute of Medicine  and an Endocrine Society practice guideline as a level of serum 25-OH  vitamin D less than 20 ng/mL (1,2). The Endocrine Society went on to  further define vitamin D insufficiency as a level between 21 and 29  ng/mL (2).  1. IOM (Institute of Medicine). 2010. Dietary reference intakes for  calcium and D. [place]: [employer]. 2. Holick MF, Binkley Gerber, Bischoff-Ferrari HA, et al. Evaluation,  treatment, and prevention of vitamin D deficiency: an Endocrine  Society clinical practice guideline, JCEM. 2011 Jul; 96(7): 1911-30.  Performed at [facility], [address] [place], [place] [address]     Blood Alcohol level:  Lab Results  Component Value Date   [facility] <10 04/07/2022   ETH <10 01/30/2022    Metabolic Disorder Labs: Lab Results  Component Value Date   HGBA1C 5.6 04/07/2022   MPG 114.02 04/07/2022   MPG 117 09/28/2015   Lab Results  Component Value Date   PROLACTIN 17.2 04/07/2022   PROLACTIN 155.0 (H) 12/28/2021   Lab Results   Component Value Date   CHOL 237 (H) 04/09/2022   TRIG 278 (H) 04/09/2022   HDL 34 (L) 04/09/2022   CHOLHDL 7.0 04/09/2022   VLDL 56 (H) 04/09/2022   LDLCALC 147 (H) 04/09/2022   LDLCALC 169 (H) 12/28/2021    Physical Findings: AIMS: Facial and Oral Movements Muscles of Facial Expression: None, normal Lips and Perioral Area: Minimal Jaw: Minimal Tongue: None, normal,Extremity Movements Upper (arms, wrists, hands, fingers): None, normal Lower (legs, knees, ankles, toes): None, normal, Trunk Movements Neck, shoulders, hips: None,  normal, Overall Severity Severity of abnormal movements (highest score from questions above): Minimal Incapacitation due to abnormal movements: None, normal Patient's awareness of abnormal movements (rate only patient's report): Aware, no distress, Dental Status Current problems with teeth and/or dentures?: No Does patient usually wear dentures?: No  CIWA:   n/a COWS:   n/a AIMS:11  Musculoskeletal: Strength & Muscle Tone: within normal limits Gait & Station: normal Patient leans: N/A  Psychiatric Specialty Exam:  Presentation  General Appearance: Appropriate for Environment; Fairly Groomed  Eye Contact:Good  Speech:Clear and Coherent  Speech Volume:Normal  Handedness:Right   Mood and Affect  Mood:Euthymic  Affect:Congruent   Thought Process  Thought Processes:Coherent  Descriptions of Associations:Intact  Orientation:Full (Time, Place and Person)  Thought Content:Logical  History of Schizophrenia/Schizoaffective disorder:Yes  Duration of Psychotic Symptoms:No data recorded Hallucinations:Hallucinations: None  Ideas of Reference:None  Suicidal Thoughts:Suicidal Thoughts: No  Homicidal Thoughts:Homicidal Thoughts: No  Sensorium  Memory:Immediate Good; Remote Poor; Recent Fair  Judgment:Good  Insight:Good  Executive Functions  Concentration:Good  Attention Span:Good  Recall:Good  Fund of  Knowledge:Good  Language:Good  Psychomotor Activity  Psychomotor Activity:Psychomotor Activity: Extrapyramidal Side Effects (EPS) Extrapyramidal Side Effects (EPS): Tardive Dyskinesia AIMS Completed?: Yes  Assets  Assets:Communication Skills; Housing; Social Support; Resilience  Sleep  Sleep:Sleep: Good  Physical Exam: Physical Exam Constitutional:      Appearance: Normal appearance.  HENT:     Head: Normocephalic.     Nose: Nose normal. No congestion or rhinorrhea.  Eyes:     Pupils: Pupils are equal, round, and reactive to light.  Pulmonary:     Effort: Pulmonary effort is normal. No respiratory distress.  Musculoskeletal:     Cervical back: Normal range of motion.  Neurological:     Mental Status: She is alert and oriented to person, place, and time.     Sensory: No sensory deficit.     Coordination: Coordination normal.  Psychiatric:        Behavior: Behavior normal.        Thought Content: Thought content normal.    Review of Systems  Constitutional: Negative.  Negative for fever.  HENT: Negative.    Eyes: Negative.   Respiratory: Negative.    Cardiovascular: Negative.   Gastrointestinal: Negative.   Genitourinary: Negative.   Musculoskeletal: Negative.   Skin: Negative.   Neurological: Negative.   Psychiatric/Behavioral:  Positive for depression (Denies SI/HI/AVH). Negative for hallucinations, memory loss, substance abuse and suicidal ideas. The patient has insomnia. The patient is not nervous/anxious.    Blood pressure 120/84, pulse 77, temperature 97.9 F (36.6 C), temperature source Oral, resp. rate 20, height 5\' 4"  (1.626 m), weight 82.6 kg, SpO2 100 %. Body mass index is 31.24 kg/m. Treatment Plan Summary: Daily contact with patient to assess and evaluate symptoms and progress in treatment and Medication management   Observation Level/Precautions:  15 minute checks  Laboratory:  Labs reviewed   Psychotherapy:  Unit Group sessions  Medications:   See [facility]  Consultations:  To be determined   Discharge Concerns:  Safety, medication compliance, mood stability  Estimated LOS: 5-7 days  Other:  N/A     PLAN Safety and Monitoring: Voluntary admission to inpatient psychiatric unit for safety, stabilization and treatment Daily contact with patient to assess and evaluate symptoms and progress in treatment Patient's case to be discussed in multi-disciplinary team meeting Observation Level : q15 minute checks Vital signs: q12 hours Precautions: Safety      Long Term Goal(s): Improvement in symptoms so  as ready for discharge   Short Term Goals: Ability to verbalize feelings will improve, Ability to disclose and discuss suicidal ideas, Ability to identify and develop effective coping behaviors will improve, and Compliance with prescribed medications will improve            Diagnoses Principal Problem:   Schizoaffective disorder, depressive type (HCC) - confirmed diagnosis with [facility] Active Problems:   Anemia, iron deficiency   Insomnia   GAD (generalized anxiety disorder)   Tardive dyskinesia Elevated TSH Major neurocognitive d/o due to Alzheimer's disease without behavioral disturbance, mild                        Medications -Start Aricept 5 mg nightly for mild cognitive impairment -Start Vitamin D 50.000 units weekly for Vitamin D deficiency -Continue Remeron 15 mg nightly for insomnia/depression -Continue Prozac 20 mg daily for depressive symptoms (consider dose titration back to  over time - holding at lower dose in case was activated at higher dose) -Continue Seroquel 100 mg nightly for mood stabilization/insomnia - monitoring QTC -Discontinued Trazodone 100 mg nightly PRN d/t QT prolongation risk -Continue Ingrezza 40 mg daily for Tardive dyskinesia -Continue Clonidine 0.1 mg (questionably for sleep vs nightmares vs BP) -Continue Ferrous sulfate 325 mg daily for iron replacement -Continue Melatonin 10 mg nightly for  insomnia -Discontinued Hydroxyzine 25 mg PRN due to TD & QT prolongation -Continue HCTZ home dose and monitoring creatinine  Other PRNS -Continue Tylenol 650 mg every 6 hours PRN for mild pain -Continue Maalox 30 mg every 4 hrs PRN for indigestion -Continue Milk of Magnesia as needed every 6 hrs for constipation  Elevated TSH (r/o subclinical hypothyroidism) - TSH 5.855 and FT4 0.95 and FT3 WNL - Will need f/u with PCP after discharge  Hyperlipidemia - Nonfasting lab - will need dietary changes and fasting lab with PCP after discharge with monitoirng while on atyical antipsychotic  Elevated creatinine - resolved - Repeat creatinine 0.94  HTN - Continue Clonidine 0.1mg  qhs - Restarting HCTZ home dose and monitoring creatinine   Discharge Planning: Social work and case management to assist with discharge planning and identification of hospital follow-up needs prior to discharge Estimated LOS: 5-7 days Discharge Concerns: Need to establish a safety plan; Medication compliance and effectiveness Discharge Goals: Return home with outpatient referrals for mental health follow-up including medication management/psychotherapy    Starleen Blue, NP 04/11/2022, 5:41 PMPatient ID: Jody [name], female   DOB: Feb 11, 1971, 51 y.o.   MRN: [id number]

## 2022-04-11 NOTE — Progress Notes (Signed)
   04/11/22 2300  Psych Admission Type (Psych Patients Only)  Admission Status Voluntary  Psychosocial Assessment  Patient Complaints Anxiety  Eye Contact Fair  Facial Expression Anxious  Affect Appropriate to circumstance  Speech Logical/coherent  Interaction Assertive  Motor Activity Slow  Appearance/Hygiene Unremarkable  Behavior Characteristics Appropriate to situation  Mood Anxious  Aggressive Behavior  Effect No apparent injury  Thought Process  Coherency WDL  Content WDL  Delusions None reported or observed  Perception WDL  Hallucination None reported or observed  Judgment Impaired  Confusion None  Danger to Self  Current suicidal ideation? Denies  Danger to Others  Danger to Others None reported or observed

## 2022-04-11 NOTE — Evaluation (Signed)
Occupational Therapy Evaluation Patient Details Name: Jody [name] MRN: [id number] DOB: 1971-06-15 Today's Date: 04/11/2022   History of Present Illness Patient is a 51y/o female with previous diagnoses of bipolar d/o, schizophrenia, GAD and MDD who presented voluntarily from [facility] for management of worsening insomnia. She states that she had been repeatedly to [facility] over preceding days trying to get her sleep cycles adjusted but was not improving.   Clinical Impression   Pt was met by OT to perform assessment w/ specific attention to cognitive status on this date. OT administered MoCA v 8.2 where her scores were recorded as follows: MoCA total score: 20/30 - MCI MoCA-MIS (memory index score): 11/15 - mild to moderate STM retrieval failure Clock Drawing Test: 10/10 in construction and time setting to 10 past 11 TMT B: errorless effort and completes the task in the time that would suggest "normal" performance per normative data for age / sex.   There are deficits in the domains of attention and concentration and well as language fluency. A/O x 6/6  Pt states she is operating at or near to her baseline and denies STM deficits are new to her. Pt admits to having difficulties w/ memory "forever". She states that since her sleep has been improving so has her overall mentation and cognitive capacity. OT does however provide education for pt to help her ID and overcome attention / concentration deficits that do interfere w/ creating memory and can overall working memory. Pt was able to return demo and show acceptable insight into OT education.      Recommendations for follow up therapy are one component of a multi-disciplinary discharge planning process, led by the attending physician.  Recommendations may be updated based on patient status, additional functional criteria and insurance authorization.   Follow Up Recommendations  No OT follow up    Assistance Recommended at Discharge None   Patient can return home with the following   Pt already has Rx in "pill packs" / she is capable of returning home once cleared x MD / No OT needs beyond this assessment.     Functional Status Assessment  Patient has not had a recent decline in their functional status               Precautions / Restrictions Precautions Precautions: None Restrictions Weight Bearing Restrictions: No      Mobility Bed Mobility Overal bed mobility: Independent                  Transfers Overall transfer level: Independent                        Balance Overall balance assessment: Independent                                         ADL either performed or assessed with clinical judgement   ADL Overall ADL's : Independent                                                                 Pertinent Vitals/Pain Pain Assessment Pain Assessment: No/denies pain     Hand Dominance Right  Communication Communication Communication: No difficulties   Cognition Arousal/Alertness: Awake/alert Behavior During Therapy: WFL for tasks assessed/performed Overall Cognitive Status: History of cognitive impairments - at baseline                                 General Comments: Pt describes her cognitive state as "forgetful" and this has been an on going and chronic issue for her not unique to this visit and is a reflection of her normal baseline per pt report.     General Comments                  Home Living Family/patient expects to be discharged to:: Private residence Living Arrangements: [place]: One level         [place]: Standard Bathroom Accessibility: Yes   Home Equipment: None          Prior Functioning/Environment Prior Level of Function : Independent/Modified Independent             Mobility Comments: ambulates w/o LOB or sway and w/o  an assistive device ADLs Comments: INDP for bALDs        OT Problem List: Decreased cognition       AM-PAC OT "6 Clicks" Daily Activity     Outcome Measure Help from another person eating meals?: None Help from another person taking care of personal grooming?: None Help from another person toileting, which includes using toliet, bedpan, or urinal?: None Help from another person bathing (including washing, rinsing, drying)?: None Help from another person to put on and taking off regular upper body clothing?: None Help from another person to put on and taking off regular lower body clothing?: None 6 Click Score: 24   End of Session Nurse Communication: Other (comment) (cognitive status w/ MoCA scores detailed and explained)  Activity Tolerance: Patient tolerated treatment well Patient left: in chair;Other (comment) (returned to group s/p OT session)  OT Visit Diagnosis: Other symptoms and signs involving cognitive function                Time: 0388-8280 OT Time Calculation (min): 30 min Charges:  OT General Charges $OT Visit: 1 Visit OT Evaluation $OT Eval Low Complexity: 1 Low OT Treatments $Cognitive Funtion inital: Initial 15 mins  Cornell Barman, OT   Brantley Stage 04/11/2022, 4:40 PM

## 2022-04-11 NOTE — Progress Notes (Signed)
   04/11/22 0500  Sleep  Number of Hours 8.5

## 2022-04-11 NOTE — Group Note (Signed)
Recreation Therapy Group Note   Group Topic:Animal Assisted Therapy   Group Date: 04/11/2022 Start Time: 1430 End Time: 1515 Facilitators: Camreigh Michie, LRT,CTRS Location: [address]   Animal-Assisted Activity (AAA) Program Checklist/Progress Note Patient Eligibility Criteria Checklist & Daily Group note for Rec Tx Intervention   AAA/T Program Assumption of Risk Form signed by Patient/ or Parent Legal Guardian YES  Patient understands their participation is voluntary YES   Affect/Mood: N/A   Participation Level: Did not attend    Clinical Observations/Individualized Feedback:     Plan: Continue to engage patient in RT group sessions 2-3x/week.   Krista Godsil, LRT,CTRS 04/11/2022 3:58 PM 

## 2022-04-12 MED ORDER — QUETIAPINE FUMARATE 100 MG PO TABS: 100.0000 mg | ORAL_TABLET | Freq: Every day | ORAL | 0 refills | Status: DC

## 2022-04-12 MED ORDER — FERROUS SULFATE 325 (65 FE) MG PO TABS: 325.0000 mg | ORAL_TABLET | Freq: Every day | ORAL | 0 refills | Status: DC

## 2022-04-12 MED ORDER — CLONIDINE HCL 0.1 MG PO TABS: 0.1000 mg | ORAL_TABLET | Freq: Every day | ORAL | 0 refills | Status: DC

## 2022-04-12 MED ORDER — HYDROCHLOROTHIAZIDE 25 MG PO TABS: 25.0000 mg | ORAL_TABLET | Freq: Every day | ORAL | 0 refills | Status: DC

## 2022-04-12 MED ORDER — MIRTAZAPINE 15 MG PO TABS
15.0000 mg | ORAL_TABLET | Freq: Every day | ORAL | 0 refills | Status: DC
Start: 2022-04-12 — End: 2022-10-27

## 2022-04-12 MED ORDER — VITAMIN D (ERGOCALCIFEROL) 1.25 MG (50000 UNIT) PO CAPS: 50000.0000 [IU] | ORAL_CAPSULE | ORAL | 0 refills | Status: DC

## 2022-04-12 MED ORDER — VALBENAZINE TOSYLATE 40 MG PO CAPS: 40.0000 mg | ORAL_CAPSULE | Freq: Every day | ORAL | 0 refills | Status: DC

## 2022-04-12 MED ORDER — MELATONIN 10 MG PO TABS: 10.0000 mg | ORAL_TABLET | Freq: Every day | ORAL | 0 refills | Status: DC

## 2022-04-12 MED ORDER — DONEPEZIL HCL 5 MG PO TABS: 5.0000 mg | ORAL_TABLET | Freq: Every day | ORAL | 0 refills | Status: DC

## 2022-04-12 MED ORDER — FLUOXETINE HCL 20 MG PO CAPS: 20.0000 mg | ORAL_CAPSULE | Freq: Every day | ORAL | 0 refills | Status: DC

## 2022-04-12 NOTE — Group Note (Signed)
Recreation Therapy Group Note   Group Topic:Team Building  Group Date: 04/12/2022 Start Time: 0935 End Time: 0950 Facilitators: Caroll Rancher, LRT,CTRS Location: [address]   Goal Area(s) Addresses:  Patient will effectively work with peer towards shared goal.  Patient will identify skills used to make activity successful.  Patient will identify how skills used during activity can be applied to reach post d/c goals.    Group Description: [employer]. In teams of 5-6, patients were given 20 small craft pipe cleaners. Using the materials provided, patients were instructed to compete again the opposing team(s) to build the tallest free-standing structure from floor level. The activity was timed; difficulty increased by Clinical research associate as Production designer, theatre/television/film continued.  Systematically resources were removed with additional directions for example, placing one arm behind their back, working in silence, and shape stipulations. LRT facilitated post-activity discussion reviewing team processes and necessary communication skills involved in completion. Patients were encouraged to reflect how the skills utilized, or not utilized, in this activity can be incorporated to positively impact support systems post discharge.   Affect/Mood: N/A   Participation Level: Did not attend    Clinical Observations/Individualized Feedback:     Plan: Continue to engage patient in RT group sessions 2-3x/week.   Caroll Rancher, LRT,CTRS 04/12/2022 11:40 AM

## 2022-04-12 NOTE — Progress Notes (Signed)
  [facility] Adult Case Management Discharge Plan :  Will you be returning to the same living situation after discharge:  Yes,  Own apartment  At discharge, do you have transportation home?: Yes,  Sister  Do you have the ability to pay for your medications: Yes,  Medicare   Release of information consent forms completed and in the chart;  Patient's signature needed at discharge.  Patient to Follow up at:  Follow-up Information     [facility] Follow up on 04/18/2022.   Why: You have a hospital follow up appointment to obtain therapy and schedule medication management services, on 04/18/22 at 3:45 pm.  This appointment will be held in person.  * PLEASE CALL TO CONFIRM THE LOCATION ADDRESS. Contact information: [address]  [address] [place] [address] [phone]                 Next level of care provider has access to [facility] Link:no  Safety Planning and Suicide Prevention discussed: Yes,  with patient and sister      Has patient been referred to the Quitline?: N/A patient is not a smoker  Patient has been referred for addiction treatment: N/A  Aram Beecham, LCSWA 04/12/2022, 9:19 AM

## 2022-04-12 NOTE — Discharge Summary (Signed)
Physician Discharge Summary Note  Patient:  Jody [name] is an 51 y.o., female MRN:  [id number] DOB:  1971-02-02 Patient phone:  [phone] (home)  Patient address:   [address] [place] [address],   Total Time spent with patient: 30 minutes  Date of Admission:  04/08/2022 Date of Discharge: 04/12/2022  Reason For Admission: Jody [name] is a 51 y.o [place] American female with prior diagnoses of bipolar d/o, schizophrenia, GAD & MDD who presented to the [facility] urgent care [facility]) with her sister due to worsening insomnia & no sleep x 5-6 days. Pt had initially presented to the [facility] with same complaint on 6/26, & again at a [facility] on 7/6. On 7/6, she was prescribed Trazodone at the medical UC, took it for one night, prior to returning the following day to [facility], leading to this admission. Pt was transferred to this [facility] voluntarily for treatment & stabilization of her insomnia & mood medication.   Principal Problem: Schizoaffective disorder, depressive type (HCC) Discharge Diagnoses: Principal Problem:   Schizoaffective disorder, depressive type (HCC) Active Problems:   Anemia, iron deficiency   Insomnia   GAD (generalized anxiety disorder)   Tardive dyskinesia   Major neurocognitive disorder, due to Alzheimer's disease, without behavioral disturbance, mild (HCC)  Past Psychiatric History: As above  Past Medical History:  Past Medical History:  Diagnosis Date   Anxiety and depression    Bipolar disorder (HCC)    Cystitis, interstitial    Elevated troponin    a. 09/2015: CP/elevated troponin up to 0.11 - unclear significance. CT neg for PE, LHC neg for CAD, normal echo.   Microcytic anemia    Noted on labs   Panic attack    Paranoid schizophrenia (HCC)    Tobacco abuse     Past Surgical History:  Procedure Laterality Date   CARDIAC CATHETERIZATION N/A 09/29/2015   Procedure: Left Heart Cath and Coronary  Angiography;  Surgeon: Corky Crafts, MD;  Location: [facility] INVASIVE CV LAB;  Service: Cardiovascular;  Laterality: N/A;   TUBAL LIGATION     Family History:  Family History  Problem Relation Age of Onset   Diabetes Father    Hypertension Mother    Hypertension Sister    Lupus Sister    Heart disease Maternal Grandfather    Lupus Other    Family Psychiatric  History: none reported Social History:  Social History   Substance and Sexual Activity  Alcohol Use Not Currently   Alcohol/week: 0.0 standard drinks of alcohol   Comment: Quit 2017     Social History   Substance and Sexual Activity  Drug Use No    Social History   Socioeconomic History   Marital status: Single    Spouse name: Not on file   Number of children: 5   Years of education: 12   Highest education level: Not on file  Occupational History   Occupation: Disabled  Tobacco Use   Smoking status: Former    Packs/day: 0.25    Years: 4.00    Total pack years: 1.00    Types: Cigarettes    Quit date: 05/01/2016    Years since quitting: 5.9   Smokeless tobacco: Never  Vaping Use   Vaping Use: Never used  Substance and Sexual Activity   Alcohol use: Not Currently    Alcohol/week: 0.0 standard drinks of alcohol    Comment: Quit 2017   Drug use: No   Sexual  activity: Not Currently    Birth control/protection: Condom  Other Topics Concern   Not on file  Social History Narrative   Lives alone   Caffeine use: Soda- daily   Right-handed   Social Determinants of Health   Financial Resource Strain: Not on file  Food Insecurity: Not on file  Transportation Needs: Not on file  Physical Activity: Not on file  Stress: Not on file  Social Connections: Not on file                                             HOSPITAL COURSE During the patient's hospitalization, patient had extensive initial psychiatric evaluation, and follow-up psychiatric evaluations every day.  Psychiatric diagnoses provided upon initial  assessment were as follows:   Principal Problem:   Schizoaffective disorder, bipolar type (HCC) Active Problems:   Anemia, iron deficiency   Insomnia   GAD (generalized anxiety disorder)   Tardive dyskinesia   Patient's psychiatric medications were adjusted on admission as follows: -Continued Prozac 20 mg daily for depressive symptoms -Increased Seroquel to 100 mg nightly for mood stabilization/insomnia -Continued Trazodone 100 mg  nightly PRN -Continued Ingrezza 40 mg daily for Tardive dyskinesia -Continued Clonidine 0.1 mg for htn -Continued Ferrous sulfate 325 mg daily for iron replacement -Continued Melatonin 10 mg nightly for insomnia -Held Remeron due to risk of mania   All of the above medications were home medications. During the hospitalization, other adjustments were made to the patient's psychiatric medication regimen.   Trazodone 100 mg was discontinued due to QT prolongation of 489. On 7/7, QT was 495 and on 7/9, it was 492. There is a slight improvement at 489, and pt has been educated to follow this up with her PCP & MH provider. Trazodone was replaced with Remeron 15 mg which his her home medication for management of insomnia and depressive symptoms. Pt has done well on this medication & has been able to sleep every night that she has been at [facility]. Hydrochlorothiazide 25 mg daily was restarted due to elevated blood pressure. Patient scored 20/30 total on the MOCA, indicating mild major neurocognitive disorder especially given the fact that patient reports memory loss in form of forgetting things frequently, forgetting names of streets, forgetting phone numbers and important dates. Patient's sister confirms patient's report, discussed with patient plan to start Aricept 5 mg at bedtime for mild dementia diagnosis and recommending outpatient follow-up for further monitoring with plan to titrate dose up to 10 mg after 1 month. Pt and sister verbalize understanding. Pt reports that she  has a neurologist, but is unable to remember their name. She however, agrees to f/u with her neurologist after discharge. Pt has remained on Ingrezza 40 mg daily for tardive dyskinesia, and there has been remarkable improvement in the involuntary movements of her face, hands and feet during this hospitalization. On admission, AIMS was 16, and at discharge, last AIMS was 4. Pt has been educated on the need to follow this up with her mental health provider after discharge and verbalizes understanding. Discontinued Hydroxyzine 25 mg PRN due to TD & QT prolongation.     Medications at discharge are as follows:    -Continue Aricept 5 mg nightly for mild cognitive impairment -Continue Vitamin D 50.000 units weekly for Vitamin D deficiency -Continue Remeron 15 mg nightly for insomnia/depression -Continue Prozac 20 mg daily for depressive  symptoms (consider dose titration back to 40mg  over time  -Continue Seroquel 100 mg nightly for mood stabilization/insomnia - monitoring QTC -Continue Ingrezza 40 mg daily for Tardive dyskinesia -Continue Clonidine 0.1 mg (questionably for sleep vs nightmares vs BP) -Continue Ferrous sulfate 325 mg daily for iron replacement -Continue Melatonin 10 mg nightly for insomnia -Continue HCTZ home dose of 25 mg daily   Patient's care was discussed during the interdisciplinary team meeting every day during the hospitalization.  The patient denies having side effects to prescribed psychiatric medication. The patient was evaluated each day by a clinical provider to ascertain response to treatment. Improvement was noted by the patient's report of decreasing symptoms, improved sleep and appetite, affect, medication tolerance, behavior, and participation in unit programming.  Patient was asked each day to complete a self inventory noting mood, mental status, pain, new symptoms, anxiety and concerns.     Symptoms were reported as significantly decreased or resolved completely by  discharge. On day of discharge, the patient reports that their mood is stable. The patient denied having suicidal thoughts for more than 48 hours prior to discharge.  Patient denies having homicidal thoughts.  Patient denies having auditory hallucinations.  Patient denies any visual hallucinations or other symptoms of psychosis. The patient was motivated to continue taking medication with a goal of continued improvement in mental health.    The patient reports their target psychiatric symptoms of insomnia responded well to the psychiatric medications, and the patient reports overall benefit from this  psychiatric hospitalization. Supportive psychotherapy was provided to the patient. The patient also participated in regular group therapy while hospitalized. Coping skills, problem solving as well as relaxation therapies were also part of the unit programming.   Labs were reviewed with the patient, and abnormal results were discussed with the patient. Cholesterol during this admission was 37, LDL-147, Triglycerides are 278. Pt has been educated on the importance of healthy food choices and exercise while on antipsychotic medications and verbalizes understanding. Vitamin D is 23.35, and being is being supplemented with Vitamin D 50.000 units, and has been educated to continue this medication after discharge and f/u with her PCP. She verbalizes understanding. TSH for this admission is 5.855. Free T3 is WNL, Free T4 is WNL. 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    Physical Findings: AIMS: Facial and Oral Movements Muscles of Facial Expression: None, normal Lips and Perioral Area: Minimal Jaw: Minimal Tongue: None, normal,Extremity Movements Upper (arms, wrists, hands, fingers): None, normal Lower (legs, knees, ankles, toes): None, normal, Trunk Movements Neck, shoulders, hips: None, normal, Overall Severity Severity of abnormal  movements (highest score from questions above): Minimal Incapacitation due to abnormal movements: None, normal Patient's awareness of abnormal movements (rate only patient's report): Aware, no distress, Dental Status Current problems with teeth and/or dentures?: No Does patient usually wear dentures?: No  CIWA:   n/a COWS:  n/a   Musculoskeletal: Strength & Muscle Tone: within normal limits Gait & Station: normal Patient leans: N/A   Psychiatric Specialty Exam:  Presentation  General Appearance: Appropriate for Environment; Fairly Groomed  Eye Contact:Good  Speech:Clear and Coherent  Speech Volume:Normal  Handedness:Right   Mood and Affect  Mood:Euthymic  Affect:Appropriate; Congruent   Thought Process  Thought Processes:Coherent  Descriptions of Associations:Intact  Orientation:Full (Time, Place and Person)  Thought Content:Logical  History of Schizophrenia/Schizoaffective disorder:Yes  Duration of Psychotic Symptoms:No data recorded Hallucinations:Hallucinations: None  Ideas of Reference:None  Suicidal Thoughts:Suicidal Thoughts: No  Homicidal Thoughts:Homicidal Thoughts: No   Sensorium  Memory:Remote Poor; Immediate Good  Judgment:Good  Insight:Good   Executive Functions  Concentration:Good  Attention Span:Good  Recall:Good  Fund of Knowledge:Good  Language:Good   Psychomotor Activity  Psychomotor Activity:Psychomotor Activity: Extrapyramidal Side Effects (EPS) Extrapyramidal Side Effects (EPS): Tardive Dyskinesia AIMS Completed?: Yes   Assets  Assets:Communication Skills; Housing; Social Support   Sleep  Sleep:Sleep: Good    Physical Exam: Physical Exam Constitutional:      Appearance: Normal appearance.  HENT:     Head: Normocephalic.     Nose: Nose normal. No congestion or rhinorrhea.  Eyes:     Pupils: Pupils are equal, round, and reactive to light.  Pulmonary:     Effort: Pulmonary effort is normal.   Musculoskeletal:        General: Normal range of motion.     Cervical back: Normal range of motion.  Neurological:     Mental Status: She is alert and oriented to person, place, and time.     Sensory: No sensory deficit.  Psychiatric:        Behavior: Behavior normal.        Thought Content: Thought content normal.    Review of Systems  Constitutional: Negative.   HENT: Negative.    Eyes: Negative.   Respiratory: Negative.    Cardiovascular: Negative.   Gastrointestinal: Negative.   Genitourinary: Negative.   Musculoskeletal: Negative.   Skin: Negative.   Neurological: Negative.   Psychiatric/Behavioral:  Positive for depression (Resolving on current medications) and memory loss (Started on Aricept and agrees to f/u with her neurologist after discharge). Negative for hallucinations, substance abuse and suicidal ideas. The patient has insomnia (resolving on current medications). The patient is not nervous/anxious.    Blood pressure 137/85, pulse 92, temperature 98.2 F (36.8 C), resp. rate 16, height 5\' 4"  (1.626 m), weight 82.6 kg, SpO2 100 %. Body mass index is 31.24 kg/m.  Social History   Tobacco Use  Smoking Status Former   Packs/day: 0.25   Years: 4.00   Total pack years: 1.00   Types: Cigarettes   Quit date: 05/01/2016   Years since quitting: 5.9  Smokeless Tobacco Never   Tobacco Cessation:  N/A, patient does not currently use tobacco products  Blood Alcohol level:  Lab Results  Component Value Date   ETH <10 04/07/2022   ETH <10 01/30/2022    Metabolic Disorder Labs:  Lab Results  Component Value Date   HGBA1C 5.6 04/07/2022   MPG 114.02 04/07/2022   MPG 117 09/28/2015   Lab Results  Component Value Date   PROLACTIN 17.2 04/07/2022   PROLACTIN 155.0 (H) 12/28/2021   Lab Results  Component Value Date   CHOL 237 (H) 04/09/2022   TRIG 278 (H) 04/09/2022   HDL 34 (L) 04/09/2022   CHOLHDL 7.0 04/09/2022   VLDL 56 (H) 04/09/2022   LDLCALC 147 (H)  04/09/2022   LDLCALC 169 (H) 12/28/2021   See Psychiatric Specialty Exam and Suicide Risk Assessment completed by Attending Physician prior to discharge.  Discharge destination:  Home  Is patient on multiple antipsychotic therapies at discharge:  No   Has Patient had three or more failed trials of antipsychotic monotherapy by history:  No  Recommended Plan for Multiple Antipsychotic Therapies: NA  Allergies as of 04/12/2022       Reactions   Penicillins Itching, Rash        Medication List  STOP taking these medications    amantadine 100 MG capsule Commonly known as: SYMMETREL   ferrous gluconate 324 MG tablet Commonly known as: FERGON   trimethoprim 100 MG tablet Commonly known as: TRIMPEX   ziprasidone 20 MG capsule Commonly known as: GEODON       TAKE these medications      Indication  cloNIDine 0.1 MG tablet Commonly known as: CATAPRES Take 1 tablet (0.1 mg total) by mouth at bedtime.  Indication: High Blood Pressure Disorder   donepezil 5 MG tablet Commonly known as: ARICEPT Take 1 tablet (5 mg total) by mouth at bedtime.  Indication: Alzheimer's Disease   ferrous sulfate 325 (65 FE) MG tablet Take 1 tablet (325 mg total) by mouth daily with breakfast. Start taking on: April 13, 2022  Indication: Iron Deficiency   FLUoxetine 20 MG capsule Commonly known as: PROZAC Take 1 capsule (20 mg total) by mouth daily. Start taking on: April 13, 2022 What changed:  medication strength how much to take  Indication: Depression   hydrochlorothiazide 25 MG tablet Commonly known as: HYDRODIURIL Take 1 tablet (25 mg total) by mouth daily.  Indication: High Blood Pressure Disorder   Melatonin 10 MG Tabs Take 10 mg by mouth at bedtime. What changed:  medication strength when to take this  Indication: Trouble Sleeping   mirtazapine 15 MG tablet Commonly known as: REMERON Take 1 tablet (15 mg total) by mouth at bedtime. What changed:  medication  strength how much to take  Indication: Major Depressive Disorder   QUEtiapine 100 MG tablet Commonly known as: SEROQUEL Take 1 tablet (100 mg total) by mouth at bedtime.  Indication: schizoaffective d/o depressive   valbenazine 40 MG capsule Commonly known as: INGREZZA Take 1 capsule (40 mg total) by mouth daily. Start taking on: April 13, 2022  Indication: Tardive Dyskinesia   Vitamin D (Ergocalciferol) 1.25 MG (50000 UNIT) Caps capsule Commonly known as: DRISDOL Take 1 capsule (50,000 Units total) by mouth every 7 (seven) days.  Indication: Vitamin D Deficiency        Follow-up Information     [facility] Follow up on 04/18/2022.   Why: You have a hospital follow up appointment to obtain therapy and schedule medication management services, on 04/18/22 at 3:45 pm.  This appointment will be held in person.  * PLEASE CALL TO CONFIRM THE LOCATION ADDRESS. Contact information: [address]  [address] [place] [address] [phone]                Follow-up recommendations:    Pt is able to verbalize her plan to this provider as below:   # It is recommended to the patient to continue psychiatric medications as prescribed, after discharge from the hospital.     # It is recommended to the patient to follow up with your outpatient psychiatric provider and PCP.   # It was discussed with the patient, the impact of alcohol, drugs, tobacco have been there overall psychiatric and medical wellbeing, and total abstinence from substance use was recommended the patient.ed.   # Prescriptions provided or sent directly to preferred pharmacy at discharge. Patient agreeable to plan. Given opportunity to ask questions. Appears to feel comfortable with discharge.    # In the event of worsening symptoms, the patient is instructed to call the crisis hotline (988), 911 and or go to the nearest ED for appropriate evaluation and treatment of symptoms. To follow-up with primary care provider  for other medical issues, concerns  and or health care needs   # Patient was discharged home with a plan to follow up as noted above.    Signed: Starleen Blueoris  Driana Dazey, NP 04/12/2022, 12:09 PM

## 2022-04-12 NOTE — Plan of Care (Signed)
?  Problem: Activity: ?Goal: Interest or engagement in leisure activities will improve ?Outcome: Progressing ?Goal: Imbalance in normal sleep/wake cycle will improve ?Outcome: Progressing ?  ?Problem: Coping: ?Goal: Coping ability will improve ?Outcome: Progressing ?Goal: Will verbalize feelings ?Outcome: Progressing ?  ?

## 2022-04-12 NOTE — [facility] Suicide Risk Assessment (Cosign Needed)
Suicide Risk Assessment  Discharge Assessment    [facility] Discharge Suicide Risk Assessment   Principal Problem: Schizoaffective disorder, depressive type [facility]) Discharge Diagnoses: Principal Problem:   Schizoaffective disorder, depressive type ([place]) Active Problems:   Anemia, iron deficiency   Insomnia   GAD (generalized anxiety disorder)   Tardive dyskinesia   Major neurocognitive disorder, due to Alzheimer's disease, without behavioral disturbance, mild ([place])  Reason For Admission: Jody [name] is a 51 y.o [place] American female with prior diagnoses of bipolar d/o, schizophrenia, GAD & MDD who presented to the [facility] health urgent care [facility]) with her sister due to worsening insomnia & no sleep x 5-6 days. Pt had initially presented to the [facility] with same complaint on 6/26, & again at a [facility] UC on 7/6. On 7/6, she was prescribed Trazodone at the medical UC, took it for one night, prior to returning the following day to [facility], leading to this admission. Pt was transferred to this [facility] voluntarily for treatment & stabilization of her insomnia & mood medication.                                            HOSPITAL COURSE During the patient's hospitalization, patient had extensive initial psychiatric evaluation, and follow-up psychiatric evaluations every day.  Psychiatric diagnoses provided upon initial assessment were as follows:   Principal Problem:   Schizoaffective disorder, bipolar type ([place]) Active Problems:   Anemia, iron deficiency   Insomnia   GAD (generalized anxiety disorder)   Tardive dyskinesia  Patient's psychiatric medications were adjusted on admission as follows: -Continued Prozac 20 mg daily for depressive symptoms -Increased Seroquel to 100 mg nightly for mood stabilization/insomnia -Continued Trazodone 100 mg  nightly PRN -Continued Ingrezza 40 mg daily for Tardive dyskinesia -Continued Clonidine 0.1 mg for htn -Continued Ferrous sulfate  325 mg daily for iron replacement -Continued Melatonin 10 mg nightly for insomnia -Held Remeron due to risk of mania  All of the above medications were home medications. During the hospitalization, other adjustments were made to the patient's psychiatric medication regimen.   Trazodone 100 mg was discontinued due to QT prolongation of 489. On 7/7, QT was 495 and on 7/9, it was 492. There is a slight improvement at 489, and pt has been educated to follow this up with her PCP & [place] provider. Trazodone was replaced with Remeron 15 mg which his her home medication for management of insomnia and depressive symptoms. Pt has done well on this medication & has been able to sleep every night that she has been at [facility]. Hydrochlorothiazide 25 mg daily was restarted due to elevated blood pressure. Patient scored 20/30 total on the [place], indicating mild major neurocognitive disorder especially given the fact that patient reports memory loss in form of forgetting things frequently, forgetting names of streets, forgetting phone numbers and important dates. Patient's sister confirms patient's report, discussed with patient plan to start Aricept 5 mg at bedtime for mild dementia diagnosis and recommending outpatient follow-up for further monitoring with plan to titrate dose up to 10 mg after 1 month. Pt and sister verbalize understanding. Pt reports that she has a neurologist, but is unable to remember their name. She however, agrees to f/u with her neurologist after discharge. Pt has remained on Ingrezza 40 mg daily for tardive dyskinesia, and there has been remarkable improvement in the involuntary movements  of her face, hands and feet during this hospitalization. On admission, AIMS was 16, and at discharge, last AIMS was 4. Pt has been educated on the need to follow this up with her mental health provider after discharge and verbalizes understanding. Discontinued Hydroxyzine 25 mg PRN due to TD & QT prolongation.     Medications at discharge are as follows:   -Continue Aricept 5 mg nightly for mild cognitive impairment -Continue Vitamin D 50.000 units weekly for Vitamin D deficiency -Continue Remeron 15 mg nightly for insomnia/depression -Continue Prozac 20 mg daily for depressive symptoms (consider dose titration back to 40mg  over time  -Continue Seroquel 100 mg nightly for mood stabilization/insomnia - monitoring QTC -Continue Ingrezza 40 mg daily for Tardive dyskinesia -Continue Clonidine 0.1 mg (questionably for sleep vs nightmares vs BP) -Continue Ferrous sulfate 325 mg daily for iron replacement -Continue Melatonin 10 mg nightly for insomnia -Continue HCTZ home dose of 25 mg daily  Patient's care was discussed during the interdisciplinary team meeting every day during the hospitalization.  The patient denies having side effects to prescribed psychiatric medication. The patient was evaluated each day by a clinical provider to ascertain response to treatment. Improvement was noted by the patient's report of decreasing symptoms, improved sleep and appetite, affect, medication tolerance, behavior, and participation in unit programming.  Patient was asked each day to complete a self inventory noting mood, mental status, pain, new symptoms, anxiety and concerns.    Symptoms were reported as significantly decreased or resolved completely by discharge. On day of discharge, the patient reports that their mood is stable. The patient denied having suicidal thoughts for more than 48 hours prior to discharge.  Patient denies having homicidal thoughts.  Patient denies having auditory hallucinations.  Patient denies any visual hallucinations or other symptoms of psychosis. The patient was motivated to continue taking medication with a goal of continued improvement in mental health.   The patient reports their target psychiatric symptoms of insomnia responded well to the psychiatric medications, and the patient  reports overall benefit from this  psychiatric hospitalization. Supportive psychotherapy was provided to the patient. The patient also participated in regular group therapy while hospitalized. Coping skills, problem solving as well as relaxation therapies were also part of the unit programming.  Labs were reviewed with the patient, and abnormal results were discussed with the patient. Cholesterol during this admission was 37, LDL-147, Triglycerides are 278. Pt has been educated on the importance of healthy food choices and exercise while on antipsychotic medications and verbalizes understanding. Vitamin D is 23.35, and being is being supplemented with Vitamin D 50.000 units, and has been educated to continue this medication after discharge and f/u with her PCP. She verbalizes understanding. TSH for this admission is 5.855. Free T3 is WNL, Free T4 is WNL. 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   Total Time spent with patient: 30 minutes  Musculoskeletal: Strength & Muscle Tone: within normal limits Gait & Station: normal Patient leans: N/A  Psychiatric Specialty Exam  Presentation  General Appearance: Appropriate for Environment; Fairly Groomed  Eye Contact:Good  Speech:Clear and Coherent  Speech Volume:Normal  Handedness:Right  Mood and Affect  Mood:Euthymic  Duration of Depression Symptoms: Greater than two weeks  Affect:Appropriate; Congruent  Thought Process  Thought Processes:Coherent  Descriptions of Associations:Intact  Orientation:Full (Time, Place and Person)  Thought Content:Logical  History of Schizophrenia/Schizoaffective disorder:Yes  Duration of Psychotic Symptoms:No data recorded Hallucinations:Hallucinations:  None  Ideas of Reference:None  Suicidal Thoughts:Suicidal Thoughts: No  Homicidal Thoughts:Homicidal Thoughts: No  Sensorium  Memory:Remote Poor; Immediate  Good  Judgment:Good  Insight:Good  Executive Functions  Concentration:Good  Attention Span:Good  Recall:Good  Fund of Knowledge:Good  Language:Good  Psychomotor Activity  Psychomotor Activity:Psychomotor Activity: Extrapyramidal Side Effects (EPS) Extrapyramidal Side Effects (EPS): Tardive Dyskinesia AIMS Completed?: Yes  Assets  Assets:Communication Skills; Housing; Social Support  Sleep  Sleep:Sleep: Good  Physical Exam: Physical Exam Review of Systems  Constitutional: Negative.   HENT: Negative.    Eyes: Negative.   Respiratory: Negative.    Cardiovascular: Negative.   Gastrointestinal: Negative.   Genitourinary: Negative.   Musculoskeletal: Negative.   Skin: Negative.   Psychiatric/Behavioral:  Positive for depression (improving with medications) and memory loss (Started Aricept 5mg  daily, and pt is agreeable to following up with her neurologist after discharge). Negative for hallucinations, substance abuse and suicidal ideas. The patient has insomnia (resolved on current medications). The patient is not nervous/anxious.    Blood pressure 137/85, pulse 92, temperature 98.2 F (36.8 C), resp. rate 16, height 5\' 4"  (1.626 m), weight 82.6 kg, SpO2 100 %. Body mass index is 31.24 kg/m.  Mental Status Per Nursing Assessment::   On Admission:  NA  Demographic Factors:  Low socioeconomic status and Living alone  Loss Factors: NA  Historical Factors: NA  Risk Reduction Factors:   Positive social support and Positive therapeutic relationship  Continued Clinical Symptoms:  Pt reports that her insomnia has resolved. She denies SI/HI/AVH and verbally contracts for safety outside of this [facility].  Cognitive Features That Contribute To Risk:  None    Suicide Risk:  Minimal: No identifiable suicidal ideation.  Patients presenting with no risk factors but with morbid ruminations; may be classified as minimal risk based on the severity of the depressive  symptoms   Follow-up Information     [facility] Follow up on 04/18/2022.   Why: You have a hospital follow up appointment to obtain therapy and schedule medication management services, on 04/18/22 at 3:45 pm.  This appointment will be held in person.  * PLEASE CALL TO CONFIRM THE LOCATION ADDRESS. Contact information: [address]  [address] [place] 04/20/22 [place] [phone]                Plan Of Care/Follow-up recommendations:  Pt is able to verbalize her plan to this provider as below:  # It is recommended to the patient to continue psychiatric medications as prescribed, after discharge from the hospital.    # It is recommended to the patient to follow up with your outpatient psychiatric provider and PCP.  # It was discussed with the patient, the impact of alcohol, drugs, tobacco have been there overall psychiatric and medical wellbeing, and total abstinence from substance use was recommended the patient.ed.  # Prescriptions provided or sent directly to preferred pharmacy at discharge. Patient agreeable to plan. Given opportunity to ask questions. Appears to feel comfortable with discharge.    # In the event of worsening symptoms, the patient is instructed to call the crisis hotline (988), 911 and or go to the nearest ED for appropriate evaluation and treatment of symptoms. To follow-up with primary care provider for other medical issues, concerns and or health care needs  # Patient was discharged home with a plan to follow up as noted above.   [phone], NP 04/12/2022, 11:36 AM

## 2022-04-12 NOTE — Progress Notes (Signed)
The focus of this group is to help patients review their daily goal of treatment and discuss progress on daily workbooks.  Pt attended the evening group and responded to all discussion prompts from the Writer. Pt shared that today was a good day on the unit, the highlight of which was talking to her sister, whom she considers supportive. Jody [name] also mentioned looking forward to her pending discharge. "I hope I'll never need to come back!"  On the subject of today's daily theme, mindfulness, Pt told that they needed to be more mindful of taking her medications as prescribed.  Pt rated her day a 7 out of 10 and her affect was appropriate.

## 2022-04-12 NOTE — Group Note (Signed)
Date:  04/12/2022 Time:  11:25 AM  Group Topic/Focus:  Orientation:   The focus of this group is to educate the patient on the purpose and policies of crisis stabilization and provide a format to answer questions about their admission.  The group details unit policies and expectations of patients while admitted.    Participation Level:  Active  Participation Quality:  Appropriate  Affect:  Appropriate  Cognitive:  Appropriate  Insight: Appropriate  Engagement in Group:  Engaged  Modes of Intervention:  Activity and Clarification Additional Comments:  Patient was engaged  in the group and participated in the reading on mindfulness.Reymundo Poll 04/12/2022, 11:25 AM

## 2022-04-12 NOTE — Progress Notes (Signed)
   04/12/22 0500  Sleep  Number of Hours 6.75

## 2022-04-12 NOTE — Progress Notes (Signed)
Jody [name]  Home per MD order.  Discussed with the patient and all questions fully answered.  Skin clean, dry and intact without evidence of skin break down, no evidence of skin tears noted.  An After Visit Summary was printed and given to the patient. Patient received prescription information given to patient and family to pick up her medication in at [facility].  D/c education completed with patient/family including follow up instructions, medication list, d/c activities limitations if indicated, with other d/c instructions as indicated by MD - patient able to verbalize understanding, all questions fully answered.   Patient instructed to return to ED, call 911, or call MD for any changes in condition.   Patient escorted to the main entrance, and D/C home via private auto 1200.  Jody [name] 04/12/2022 1:35 PM

## 2022-04-13 LAB — VITAMIN B1: Vitamin B1 (Thiamine): 77.1 nmol/L (ref 66.5–200.0)

## 2022-04-14 ENCOUNTER — Telehealth: Payer: Self-pay | Admitting: Emergency Medicine

## 2022-04-14 NOTE — Telephone Encounter (Signed)
Copied from CRM [id number]. Topic: Referral - Request for Referral >> Apr 13, 2022  5:36 PM Franchot Heidelberg wrote: Has patient seen PCP for this complaint? Yes  *If NO, is insurance requiring patient see PCP for this issue before PCP can refer them? Referral for which specialty: Neurology  Preferred provider/office: [facility] Reason for referral: Pt called in reporting that she discussed a neuro referral about 2 months ago with pcp.Marland KitchenMarland Kitchen

## 2022-04-17 NOTE — Telephone Encounter (Signed)
Patient requesting referral for Mri> I instructed her that she had a normal MRI 12-2021. No neurology referral needed at this time

## 2022-04-18 ENCOUNTER — Telehealth: Payer: Self-pay | Admitting: Emergency Medicine

## 2022-04-18 DIAGNOSIS — F251 Schizoaffective disorder, depressive type: Secondary | ICD-10-CM | POA: Diagnosis not present

## 2022-04-18 NOTE — Telephone Encounter (Signed)
Copied from CRM [id number]. Topic: Referral - Request for Referral >> Apr 18, 2022  4:57 PM Dondra Prader E wrote: Has patient seen PCP for this complaint? Yes.   *If NO, is insurance requiring patient see PCP for this issue before PCP can refer them? Referral for which specialty: GYN  Preferred provider/office: [facility] (Insurance requires referral)  Reason for referral: Menopause

## 2022-04-18 NOTE — Telephone Encounter (Signed)
Instructed patient's older sister of note from PCP below, however the patient's sister says she is going to take her back to neurology because she says the most recent results indicate that the patient has cognitive decline.

## 2022-04-18 NOTE — Telephone Encounter (Signed)
Copied from CRM [id number]. Topic: Referral - Request for Referral >> Apr 18, 2022  4:55 PM Franchot Heidelberg wrote: Reason for CRM:   Has patient seen PCP for this complaint? Yes.   *If NO, is insurance requiring patient see PCP for this issue before PCP can refer them? Referral for which specialty: Cardiology  Preferred provider/office: [facility] in network  Reason for referral: Recommended per [facility]

## 2022-04-19 ENCOUNTER — Encounter: Payer: Self-pay | Admitting: Nurse Practitioner

## 2022-04-19 NOTE — Telephone Encounter (Signed)
Noted will make provider aware

## 2022-04-19 NOTE — Telephone Encounter (Signed)
Televisit on Tuesday for reason of cardiology referral

## 2022-04-25 ENCOUNTER — Ambulatory Visit (HOSPITAL_BASED_OUTPATIENT_CLINIC_OR_DEPARTMENT_OTHER): Payer: Medicare Other | Admitting: Nurse Practitioner

## 2022-04-25 ENCOUNTER — Encounter: Payer: Self-pay | Admitting: Nurse Practitioner

## 2022-04-25 DIAGNOSIS — R9431 Abnormal electrocardiogram [ECG] [EKG]: Secondary | ICD-10-CM | POA: Diagnosis not present

## 2022-04-25 NOTE — Progress Notes (Addendum)
Virtual Visit via Telephone Note  I discussed the limitations, risks, security and privacy concerns of performing an evaluation and management service by telephone and the availability of in person appointments. I also discussed with the patient that there may be a patient responsible charge related to this service. The patient expressed understanding and agreed to proceed.    I connected with Jody [name] on 10/10/22  at   8:30 AM EDT  EDT by telephone and verified that I am speaking with the correct person using two identifiers.  Location of Patient: Private Residence   Location of Provider: [facility]    Persons participating in Telemedicine visit: Jody Denver FNP-BC [name]  SISTER Jody [name] provides most of information given today   History of Present Illness: Telemedicine visit for: Abnormal ekg  She has a history of bipolar disorder, panic attacks, tardive dyskinesia, and paranoid schizophrenia.   Recent EKGs from ED showing abnormal EKG reading with prolonged QT interval with QTcB as high as 495. She is asymptomatic however she notes being instructed by the ED physician at one of her recent visits to request a referral to cardiology. She is currently taking seroquel, ingrezza, remeron and aricept which is prescribed by [facility]. Which may all be contributing to EKG changes as well. Most recent potassium 4.3 (04-10-2022)      Past Medical History:  Diagnosis Date   Anxiety and depression    Bipolar disorder (HCC)    Cystitis, interstitial    Elevated troponin    a. 09/2015: CP/elevated troponin up to 0.11 - unclear significance. CT neg for PE, LHC neg for CAD, normal echo.   Hypertension    Microcytic anemia    Noted on labs   Panic attack    Paranoid schizophrenia (HCC)    Tobacco abuse     Past Surgical History:  Procedure Laterality Date   CARDIAC CATHETERIZATION N/A 09/29/2015   Procedure: Left Heart Cath and  Coronary Angiography;  Surgeon: Corky Crafts, MD;  Location: [facility] INVASIVE CV LAB;  Service: Cardiovascular;  Laterality: N/A;   TUBAL LIGATION      Family History  Problem Relation Age of Onset   Diabetes Father    Hypertension Mother    Hypertension Sister    Lupus Sister    Heart disease Maternal Grandfather    Lupus Other     Social History   Socioeconomic History   Marital status: Single    Spouse name: Not on file   Number of children: 5   Years of education: 12   Highest education level: Not on file  Occupational History   Occupation: Disabled  Tobacco Use   Smoking status: Former    Packs/day: 0.25    Years: 4.00    Total pack years: 1.00    Types: Cigarettes    Quit date: 05/01/2016    Years since quitting: 6.4   Smokeless tobacco: Never  Vaping Use   Vaping Use: Never used  Substance and Sexual Activity   Alcohol use: Not Currently    Alcohol/week: 0.0 standard drinks of alcohol    Comment: Quit 2017   Drug use: No   Sexual activity: Not Currently    Birth control/protection: Condom  Other Topics Concern   Not on file  Social History Narrative   Lives alone   Caffeine use: Soda- daily   Right-handed   Social Determinants of Health   Financial Resource Strain: Not on file  Food  Insecurity: Not on file  Transportation Needs: Not on file  Physical Activity: Not on file  Stress: Not on file  Social Connections: Not on file     Observations/Objective: Awake, alert and oriented x 3   Review of Systems  Constitutional:  Negative for fever, malaise/fatigue and weight loss.  HENT: Negative.  Negative for nosebleeds.   Eyes: Negative.  Negative for blurred vision, double vision and photophobia.  Respiratory: Negative.  Negative for cough and shortness of breath.   Cardiovascular: Negative.  Negative for chest pain, palpitations and leg swelling.  Gastrointestinal: Negative.  Negative for heartburn, nausea and vomiting.  Musculoskeletal:  Negative.  Negative for myalgias.  Neurological: Negative.  Negative for dizziness, focal weakness, seizures and headaches.  Psychiatric/Behavioral:  Positive for depression, hallucinations and memory loss. Negative for suicidal ideas. The patient is nervous/anxious and has insomnia.     Assessment and Plan: Diagnoses and all orders for this visit:  Prolonged Q-T interval on ECG -     Ambulatory referral to Cardiology     Follow Up Instructions Return in about 3 months (around 07/26/2022).     I discussed the assessment and treatment plan with the patient. The patient was provided an opportunity to ask questions and all were answered. The patient agreed with the plan and demonstrated an understanding of the instructions.   The patient was advised to call back or seek an in-person evaluation if the symptoms worsen or if the condition fails to improve as anticipated.  I provided 13 minutes of non-face-to-face time during this encounter including median intraservice time, reviewing previous notes, labs, imaging, medications and explaining diagnosis and management.  Claiborne Rigg, FNP-BC

## 2022-05-09 ENCOUNTER — Other Ambulatory Visit: Payer: Self-pay | Admitting: Nurse Practitioner

## 2022-05-09 NOTE — Telephone Encounter (Signed)
Medication Refill - Medication: Vitamin D, Ergocalciferol, (DRISDOL) 1.25 MG (50000 UNIT) CAPS capsule ferrous sulfate 325 (65 FE) MG tablet donepezil (ARICEPT) 5 MG tablet  Has the patient contacted their pharmacy? No.   Preferred Pharmacy (with phone number or street name):  [id number] - [place], [place] - [address] [place] [address] Phone:  [phone]  Fax:  [phone]     Has the patient been seen for an appointment in the last year OR does the patient have an upcoming appointment? Yes.    Patient was given these rxs in the hospital and they need to be refilled. Patient want to know if she needs to be seen for them to be refilled.

## 2022-05-09 NOTE — Telephone Encounter (Signed)
Requested medications are due for refill today.  yes  Requested medications are on the active medications list.  yes  Last refill. Vit D 04/12/2022 #5 0 refills, Iron 04/13/2022 #30 0 refills, Aricept 04/12/2022 #30 0 refills  Future visit scheduled.   no  Notes to clinic.  Vit D not delegated. Iron has missing labs. Aricept rx was written by Starleen Blue.    Requested Prescriptions  Pending Prescriptions Disp Refills   Vitamin D, Ergocalciferol, (DRISDOL) 1.25 MG (50000 UNIT) CAPS capsule 5 capsule 0    Sig: Take 1 capsule (50,000 Units total) by mouth every 7 (seven) days.     Endocrinology:  Vitamins - Vitamin D Supplementation 2 Failed - 05/09/2022 12:56 PM      Failed - Manual Review: Route requests for 50,000 IU strength to the provider      Failed - Vitamin D in normal range and within 360 days    Vit D, 25-Hydroxy  Date Value Ref Range Status  04/10/2022 23.35 (L) 30 - 100 ng/mL Final    Comment:    (NOTE) Vitamin D deficiency has been defined by the Institute of Medicine  and an Endocrine Society practice guideline as a level of serum 25-OH  vitamin D less than 20 ng/mL (1,2). The Endocrine Society went on to  further define vitamin D insufficiency as a level between 21 and 29  ng/mL (2).  1. IOM (Institute of Medicine). 2010. Dietary reference intakes for  calcium and D. [place]: [employer]. 2. Holick MF, Binkley [place], Bischoff-Ferrari HA, et al. Evaluation,  treatment, and prevention of vitamin D deficiency: an Endocrine  Society clinical practice guideline, JCEM. 2011 Jul; 96(7): 1911-30.  Performed at [facility], [address] [place], [place] [address]          Passed - Ca in normal range and within 360 days    Calcium  Date Value Ref Range Status  04/10/2022 9.4 8.9 - 10.3 mg/dL Final         Passed - Valid encounter within last 12 months    Recent Outpatient Visits           2 weeks ago Prolonged Q-T interval on ECG    [facility] [place], Shea Stakes, NP   3 months ago Acute pyelonephritis   [facility] [place], Shea Stakes, NP   4 months ago Pain of left breast   [facility] Claiborne Rigg, NP   11 months ago Burning with urination   [facility] [place], [place] W, NP   1 year ago Essential hypertension   [facility] [place], Shea Stakes, NP       Future Appointments             In 3 weeks Jake Bathe, MD [facility], [facility]             ferrous sulfate 325 (65 FE) MG tablet 30 tablet 0    Sig: Take 1 tablet (325 mg total) by mouth daily with breakfast.     Endocrinology:  Minerals - Iron Supplementation Failed - 05/09/2022 12:56 PM      Failed - RBC in normal range and within 360 days    RBC  Date Value Ref Range Status  04/07/2022 5.45 (H) 3.87 - 5.11 MIL/uL Final  Failed - Fe (serum) in normal range and within 360 days    Iron  Date Value Ref Range Status  06/10/2018 91 27 - 159 ug/dL Final   Iron Saturation  Date Value Ref Range Status  06/10/2018 30 15 - 55 % Final         Failed - Ferritin in normal range and within 360 days    Ferritin  Date Value Ref Range Status  06/10/2018 66 15 - 150 ng/mL Final         Passed - HGB in normal range and within 360 days    Hemoglobin  Date Value Ref Range Status  04/07/2022 12.9 12.0 - 15.0 g/dL Final  00/93/8182 99.3 11.1 - 15.9 g/dL Final         Passed - HCT in normal range and within 360 days    HCT  Date Value Ref Range Status  04/07/2022 39.5 36.0 - 46.0 % Final   Hematocrit  Date Value Ref Range Status  01/30/2022 39.6 34.0 - 46.6 % Final         Passed - Valid encounter within last 12 months    Recent Outpatient Visits           2 weeks ago Prolonged Q-T interval on ECG   [facility] [place], Shea Stakes, NP   3  months ago Acute pyelonephritis   [facility] [place], Shea Stakes, NP   4 months ago Pain of left breast   [facility] [place], Shea Stakes, NP   11 months ago Burning with urination   [facility] [place], Shea Stakes, NP   1 year ago Essential hypertension   [place] [address] [facility] [place], Shea Stakes, NP       Future Appointments             In 3 weeks Jake Bathe, MD [facility], [facility]             donepezil (ARICEPT) 5 MG tablet 30 tablet 0    Sig: Take 1 tablet (5 mg total) by mouth at bedtime.     Neurology:  Alzheimer's Agents Passed - 05/09/2022 12:56 PM      Passed - Valid encounter within last 6 months    Recent Outpatient Visits           2 weeks ago Prolonged Q-T interval on ECG   [facility] [place], Shea Stakes, NP   3 months ago Acute pyelonephritis   [facility] And [facility]ness Claiborne Rigg, NP   4 months ago Pain of left breast   [facility] Claiborne Rigg, NP   11 months ago Burning with urination   [facility] [place], Shea Stakes, NP   1 year ago Essential hypertension   [facility] [place], Shea Stakes, NP       Future Appointments             In 3 weeks Anne Fu, Veverly Fells, MD [facility], [facility]

## 2022-05-10 MED ORDER — FERROUS SULFATE 325 (65 FE) MG PO TABS: 325.0000 mg | ORAL_TABLET | Freq: Every day | ORAL | 0 refills | Status: DC

## 2022-05-10 MED ORDER — DONEPEZIL HCL 5 MG PO TABS: 5.0000 mg | ORAL_TABLET | Freq: Every day | ORAL | 0 refills | Status: DC

## 2022-05-10 MED ORDER — VITAMIN D (ERGOCALCIFEROL) 1.25 MG (50000 UNIT) PO CAPS
50000.0000 [IU] | ORAL_CAPSULE | ORAL | 0 refills | Status: DC
Start: 2022-05-10 — End: 2022-06-07

## 2022-06-02 ENCOUNTER — Other Ambulatory Visit: Payer: Self-pay | Admitting: Family Medicine

## 2022-06-02 ENCOUNTER — Ambulatory Visit: Payer: Medicare Other | Admitting: Cardiology

## 2022-06-02 ENCOUNTER — Other Ambulatory Visit: Payer: Self-pay | Admitting: Nurse Practitioner

## 2022-06-02 DIAGNOSIS — I10 Essential (primary) hypertension: Secondary | ICD-10-CM

## 2022-06-06 ENCOUNTER — Telehealth: Payer: Self-pay | Admitting: Nurse Practitioner

## 2022-06-06 NOTE — Telephone Encounter (Signed)
Requested medication (s) are due for refill today: yes  Requested medication (s) are on the active medication list: yes  Last refill:  05/10/22 #30/0  Future visit scheduled: no  Notes to clinic:  Unable to refill per protocol due to failed labs, no updated results.    Requested Prescriptions  Pending Prescriptions Disp Refills   FEROSUL 325 (65 Fe) MG tablet [Pharmacy Med Name: Ferrous Sulfate 325 (65 Fe)MG TABS] 28 tablet     Sig: TAKE 1 TABLET BY MOUTH DAILY WITH BREAKFAST     Endocrinology:  Minerals - Iron Supplementation Failed - 06/02/2022  5:44 PM      Failed - RBC in normal range and within 360 days    RBC  Date Value Ref Range Status  04/07/2022 5.45 (H) 3.87 - 5.11 MIL/uL Final         Failed - Fe (serum) in normal range and within 360 days    Iron  Date Value Ref Range Status  06/10/2018 91 27 - 159 ug/dL Final   Iron Saturation  Date Value Ref Range Status  06/10/2018 30 15 - 55 % Final         Failed - Ferritin in normal range and within 360 days    Ferritin  Date Value Ref Range Status  06/10/2018 66 15 - 150 ng/mL Final         Passed - HGB in normal range and within 360 days    Hemoglobin  Date Value Ref Range Status  04/07/2022 12.9 12.0 - 15.0 g/dL Final  08/65/7846 96.2 11.1 - 15.9 g/dL Final         Passed - HCT in normal range and within 360 days    HCT  Date Value Ref Range Status  04/07/2022 39.5 36.0 - 46.0 % Final   Hematocrit  Date Value Ref Range Status  01/30/2022 39.6 34.0 - 46.6 % Final         Passed - Valid encounter within last 12 months    Recent Outpatient Visits           1 month ago Prolonged Q-T interval on ECG   [facility] [place], Shea Stakes, NP   4 months ago Acute pyelonephritis   [facility] [place], Shea Stakes, NP   5 months ago Pain of left breast   [facility] [place], Shea Stakes, NP   1 year ago Burning with urination   [facility] [place], Shea Stakes, NP   1 year ago Essential hypertension   [facility] [place], Shea Stakes, NP               Vitamin D, Ergocalciferol, (DRISDOL) 1.25 MG (50000 UNIT) CAPS capsule [Pharmacy Med Name: Vitamin D (Ergocalciferol) 1.25 MG(50000 UT) CAPS] 4 capsule     Sig: TAKE 1 CAPSULE BY MOUTH ONCE EVERY SEVEN DAYS     Endocrinology:  Vitamins - Vitamin D Supplementation 2 Failed - 06/02/2022  5:44 PM      Failed - Manual Review: Route requests for 50,000 IU strength to the provider      Failed - Vitamin D in normal range and within 360 days    Vit D, 25-Hydroxy  Date Value Ref Range Status  04/10/2022 23.35 (L) 30 - 100 ng/mL Final    Comment:    (NOTE) Vitamin D deficiency has been defined by the Institute  of Medicine  and an Endocrine Society practice guideline as a level of serum 25-OH  vitamin D less than 20 ng/mL (1,2). The Endocrine Society went on to  further define vitamin D insufficiency as a level between 21 and 29  ng/mL (2).  1. IOM (Institute of Medicine). 2010. Dietary reference intakes for  calcium and D. [place]: [employer]. 2. Holick MF, Binkley [place], Bischoff-Ferrari HA, et al. Evaluation,  treatment, and prevention of vitamin D deficiency: an Endocrine  Society clinical practice guideline, JCEM. 2011 Jul; 96(7): 1911-30.  Performed at [facility], [address] [place], [place] [address]          Passed - Ca in normal range and within 360 days    Calcium  Date Value Ref Range Status  04/10/2022 9.4 8.9 - 10.3 mg/dL Final         Passed - Valid encounter within last 12 months    Recent Outpatient Visits           1 month ago Prolonged Q-T interval on ECG   [facility] [place], Shea Stakes, NP   4 months ago Acute pyelonephritis   [facility] [place], Shea Stakes, NP   5 months ago Pain of left breast    [facility] Claiborne Rigg, NP   1 year ago Burning with urination   [facility] And Wellness Claiborne Rigg, NP   1 year ago Essential hypertension   [facility] And Wellness [place], Shea Stakes, NP

## 2022-06-06 NOTE — Telephone Encounter (Signed)
Requested medication (s) are due for refill today: yes  Requested medication (s) are on the active medication list: yes  Last refill:  04/12/22 #30 0 refills  Future visit scheduled: yes in 1 month  Notes to clinic:  last ordered by Starleen Blue, NP. Do you want to refill Rx?     Requested Prescriptions  Pending Prescriptions Disp Refills   hydrochlorothiazide (HYDRODIURIL) 25 MG tablet [Pharmacy Med Name: hydroCHLOROthiazide 25MG  TABS*] 28 tablet     Sig: TAKE 1 TABLET BY MOUTH DAILY     Cardiovascular: Diuretics - Thiazide Passed - 06/06/2022  1:21 PM      Passed - Cr in normal range and within 180 days    Creat  Date Value Ref Range Status  11/01/2016 0.89 0.50 - 1.10 mg/dL Final   Creatinine, Ser  Date Value Ref Range Status  04/10/2022 0.94 0.44 - 1.00 mg/dL Final   Creatinine, POC  Date Value Ref Range Status  02/01/2017 50 mg/dL Final         Passed - K in normal range and within 180 days    Potassium  Date Value Ref Range Status  04/10/2022 4.3 3.5 - 5.1 mmol/L Final         Passed - Na in normal range and within 180 days    Sodium  Date Value Ref Range Status  04/10/2022 141 135 - 145 mmol/L Final  12/28/2021 141 134 - 144 mmol/L Final         Passed - Last BP in normal range    BP Readings from Last 1 Encounters:  04/06/22 (!) 105/58         Passed - Valid encounter within last 6 months    Recent Outpatient Visits           1 month ago Prolonged Q-T interval on ECG   [facility] [place], [place], NP   4 months ago Acute pyelonephritis   [facility], NP   5 months ago Pain of left breast   [facility], NP   1 year ago Burning with urination   [facility] [place], [place], NP   1 year ago Essential hypertension    [facility] [place], [place], NP       Future  Appointments             In 1 month Shea Stakes, NP [facility] And Wellness

## 2022-06-06 NOTE — Telephone Encounter (Signed)
Requested medication (s) are due for refill today: Yes  Requested medication (s) are on the active medication list: Yes  Last refill:  04/12/22  Future visit scheduled: No   Notes to clinic:  Last filled by Doris Nkwenti,N.P.    Requested Prescriptions  Pending Prescriptions Disp Refills   hydrochlorothiazide (HYDRODIURIL) 25 MG tablet [Pharmacy Med Name: hydroCHLOROthiazide 25MG  TABS*] 28 tablet     Sig: TAKE 1 TABLET BY MOUTH DAILY     Cardiovascular: Diuretics - Thiazide Passed - 06/02/2022  5:41 PM      Passed - Cr in normal range and within 180 days    Creat  Date Value Ref Range Status  11/01/2016 0.89 0.50 - 1.10 mg/dL Final   Creatinine, Ser  Date Value Ref Range Status  04/10/2022 0.94 0.44 - 1.00 mg/dL Final   Creatinine, POC  Date Value Ref Range Status  02/01/2017 50 mg/dL Final         Passed - K in normal range and within 180 days    Potassium  Date Value Ref Range Status  04/10/2022 4.3 3.5 - 5.1 mmol/L Final         Passed - Na in normal range and within 180 days    Sodium  Date Value Ref Range Status  04/10/2022 141 135 - 145 mmol/L Final  12/28/2021 141 134 - 144 mmol/L Final         Passed - Last BP in normal range    BP Readings from Last 1 Encounters:  04/06/22 (!) 105/58         Passed - Valid encounter within last 6 months    Recent Outpatient Visits           1 month ago Prolonged Q-T interval on ECG   [facility] [place], [place], NP   4 months ago Acute pyelonephritis   [facility], NP   5 months ago Pain of left breast   [facility], NP   1 year ago Burning with urination   [facility] [place], [place], NP   1 year ago Essential hypertension   [facility] And Wellness [place], [place], NP

## 2022-06-06 NOTE — Telephone Encounter (Signed)
Pt is calling to check on status of medication refill. Please advise

## 2022-06-21 NOTE — Progress Notes (Signed)
Cardiology Office Note:    Date:  06/26/2022   ID:  Jody [name], DOB [birth year], MRN [id number]  PCP:  Claiborne Rigg, NP   [facility] Providers Cardiologist:  None     Referring MD: Claiborne Rigg, NP   Chief Complaint  Patient presents with   prolonged qt    History of Present Illness:    Jody [name] is a 51 y.o. female seen at the request of Bertram Denver NP for evaluation of prolonged QT. She has a history of bipolar disorder/schizophrenia. Prior cardiac evaluation in 2016 with normal Echo and cardiac cath. Has a history of HTN. She was most recently admitted to [facility] in July. It was noted at the time her QTc was prolonged up to 492 msec. Her Trazodone was discontinued and she was placed on Remeron for sleep. Remained on Seroquel and Prozac which she has been on chronically. Of note QTc in 2014 was 469. In 2016 QTc ranged from 462-288 msec. In 2017 it was 478 msec. Longest QTc was in May this year and it was 500 msec. This was in the setting of hypokalemia with potassium level of 3. She denies any history of palpitations, dizziness or syncope. No history of Torsades.   Past Medical History:  Diagnosis Date   Anxiety and depression    Bipolar disorder (HCC)    Cystitis, interstitial    Elevated troponin    a. 09/2015: CP/elevated troponin up to 0.11 - unclear significance. CT neg for PE, LHC neg for CAD, normal echo.   Hypertension    Microcytic anemia    Noted on labs   Panic attack    Paranoid schizophrenia (HCC)    Tobacco abuse     Past Surgical History:  Procedure Laterality Date   CARDIAC CATHETERIZATION N/A 09/29/2015   Procedure: Left Heart Cath and Coronary Angiography;  Surgeon: Corky Crafts, MD;  [facility] INVASIVE CV LAB;  Service: Cardiovascular;  Laterality: N/A;   TUBAL LIGATION      Current Medications: Current Meds  Medication Sig   cloNIDine (CATAPRES) 0.1 MG tablet Take 1 tablet (0.1 mg total) by  mouth at bedtime.   donepezil (ARICEPT) 5 MG tablet TAKE 1 TABLET BY MOUTH AT BEDTIME   ferrous sulfate (FEROSUL) 325 (65 FE) MG tablet Take 1 tablet (325 mg total) by mouth daily with breakfast.   FLUoxetine (PROZAC) 20 MG capsule Take 1 capsule (20 mg total) by mouth daily.   hydrochlorothiazide (HYDRODIURIL) 25 MG tablet TAKE 1 TABLET BY MOUTH DAILY   hydrOXYzine (ATARAX) 25 MG tablet Take by mouth.   melatonin 10 MG TABS Take 10 mg by mouth at bedtime.   mirtazapine (REMERON) 15 MG tablet Take 1 tablet (15 mg total) by mouth at bedtime.   QUEtiapine (SEROQUEL) 100 MG tablet Take 1 tablet (100 mg total) by mouth at bedtime.   valbenazine (INGREZZA) 40 MG capsule Take 1 capsule (40 mg total) by mouth daily.   Vitamin D, Ergocalciferol, (DRISDOL) 1.25 MG (50000 UNIT) CAPS capsule TAKE 1 CAPSULE BY MOUTH ONCE EVERY SEVEN DAYS     Allergies:   Penicillins   Social History   Socioeconomic History   Marital status: Single    Spouse name: Not on file   Number of children: 5   Years of education: 12   Highest education level: Not on file  Occupational History   Occupation: Disabled  Tobacco Use   Smoking status: Former  Packs/day: 0.25    Years: 4.00    Total pack years: 1.00    Types: Cigarettes    Quit date: 05/01/2016    Years since quitting: 6.1   Smokeless tobacco: Never  Vaping Use   Vaping Use: Never used  Substance and Sexual Activity   Alcohol use: Not Currently    Alcohol/week: 0.0 standard drinks of alcohol    Comment: Quit 2017   Drug use: No   Sexual activity: Not Currently    Birth control/protection: Condom  Other Topics Concern   Not on file  Social History Narrative   Lives alone   Caffeine use: Soda- daily   Right-handed   Social Determinants of Health   Financial Resource Strain: Not on file  Food Insecurity: Not on file  Transportation Needs: Not on file  Physical Activity: Not on file  Stress: Not on file  Social Connections: Not on file      Family History: The patient's family history includes Diabetes in her father; Heart disease in her maternal grandfather; Hypertension in her mother and sister; Lupus in her sister and another family member.  ROS:   Please see the history of present illness.     All other systems reviewed and are negative.  EKGs/Labs/Other Studies Reviewed:    The following studies were reviewed today: Echo 09/29/15: Study Conclusions   - Left ventricle: The cavity size was normal. Systolic function was    normal. The estimated ejection fraction was in the range of 60%    to 65%. Wall motion was normal; there were no regional wall    motion abnormalities.   Cardiac cath 09/29/15: Conclusion  No significant coronary artery disease. Normal LVEDP.   Continue aggressive preventive therapy and risk factor modification.    EKG:  EKG is ordered today.  The ekg ordered today demonstrates NSR rate 63. QTc 478 msec. Normal. I have personally reviewed and interpreted this study.   Recent Labs: 04/07/2022: Hemoglobin 12.9; Magnesium 2.1; Platelets 350; TSH 5.855 04/10/2022: ALT 29; BUN 10; Creatinine, Ser 0.94; Potassium 4.3; Sodium 141  Recent Lipid Panel    Component Value Date/Time   CHOL 237 (H) 04/09/2022 1822   CHOL 228 (H) 12/28/2021 1152   TRIG 278 (H) 04/09/2022 1822   HDL 34 (L) 04/09/2022 1822   HDL 40 12/28/2021 1152   CHOLHDL 7.0 04/09/2022 1822   VLDL 56 (H) 04/09/2022 1822   LDLCALC 147 (H) 04/09/2022 1822   LDLCALC 169 (H) 12/28/2021 1152     Risk Assessment/Calculations:                Physical Exam:    VS:  BP 112/79   Pulse 63   Ht 5\' 4"  (1.626 m)   Wt 188 lb (85.3 kg)   LMP  (LMP Unknown)   SpO2 98%   BMI 32.27 kg/m     Wt Readings from Last 3 Encounters:  06/26/22 188 lb (85.3 kg)  04/08/22 182 lb (82.6 kg)  01/30/22 188 lb (85.3 kg)     GEN:  Well nourished, well developed in no acute distress HEENT: Normal NECK: No JVD; No carotid bruits LYMPHATICS:  No lymphadenopathy CARDIAC: RRR, no murmurs, rubs, gallops RESPIRATORY:  Clear to auscultation without rales, wheezing or rhonchi  ABDOMEN: Soft, non-tender, non-distended MUSCULOSKELETAL:  No edema; No deformity  SKIN: Warm and dry NEUROLOGIC:  Alert and oriented x 3 PSYCHIATRIC:  Normal affect   ASSESSMENT:    1. 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 Exacerbated by hypokalemia. Current medications which prolong QT include Fluoxetine, Quetiapine. He QT on her current therapy is acceptable and at her baseline. I think she is ok to continue her current regimen. I would pay close attention to her potassium levels. If unable to maintain normal potassium levels then I would stop HCTZ and manage her HTN with other medication. Looks like stopping her Trazadone in the hospital was appropriate. Generally speaking should avoid therapy that prolongs QT by more than 30 msec or results in QTc over 500 msec although some favor QTc < 480 msec. Fortunately she has no history of arrhythmia and from prior evaluation in 2016 normal coronaries and LV function.       Plan: follow up PRN      Medication Adjustments/Labs and Tests Ordered: Current medicines are reviewed at length with the patient today.  Concerns regarding medicines are outlined above.  Orders Placed This Encounter  Procedures   EKG 12-Lead   No orders of the defined types were placed in this encounter.   There are no Patient Instructions on file for this visit.   Signed, Jaquay Posthumus [place], MD  06/26/2022 3:41 PM    [facility]

## 2022-06-26 ENCOUNTER — Ambulatory Visit: Payer: Medicare Other | Attending: Cardiology | Admitting: Cardiology

## 2022-06-26 ENCOUNTER — Encounter: Payer: Self-pay | Admitting: Cardiology

## 2022-06-26 VITALS — BP 112/79 | HR 63 | Ht 64.0 in | Wt 188.0 lb

## 2022-06-26 DIAGNOSIS — Z87898 Personal history of other specified conditions: Secondary | ICD-10-CM | POA: Insufficient documentation

## 2022-07-03 ENCOUNTER — Other Ambulatory Visit: Payer: Self-pay | Admitting: Nurse Practitioner

## 2022-07-17 DIAGNOSIS — F5101 Primary insomnia: Secondary | ICD-10-CM | POA: Diagnosis not present

## 2022-07-18 ENCOUNTER — Ambulatory Visit: Payer: Medicare Other | Admitting: Nurse Practitioner

## 2022-07-26 ENCOUNTER — Other Ambulatory Visit: Payer: Self-pay | Admitting: Family Medicine

## 2022-07-26 DIAGNOSIS — I10 Essential (primary) hypertension: Secondary | ICD-10-CM

## 2022-08-16 ENCOUNTER — Other Ambulatory Visit: Payer: Self-pay | Admitting: Family Medicine

## 2022-08-16 DIAGNOSIS — I10 Essential (primary) hypertension: Secondary | ICD-10-CM

## 2022-08-17 NOTE — Telephone Encounter (Signed)
Requested Prescriptions  Pending Prescriptions Disp Refills   hydrochlorothiazide (HYDRODIURIL) 25 MG tablet [Pharmacy Med Name: hydroCHLOROthiazide 25MG  TABS*] 61 tablet 0    Sig: TAKE 1 TABLET BY MOUTH DAILY     Cardiovascular: Diuretics - Thiazide Failed - 08/16/2022  5:12 PM      Failed - Valid encounter within last 6 months    Recent Outpatient Visits           3 months ago Prolonged Q-T interval on ECG   [facility] [place], [place], NP   6 months ago Acute pyelonephritis   [facility] [place], [place], NP   7 months ago Pain of left breast   [facility] [place], [place], NP   1 year ago Burning with urination   [facility] [place], [place], NP   1 year ago Essential hypertension   [facility] [place], [place], NP       Future Appointments             In 1 month Shea Stakes, NP [facility]            Passed - Cr in normal range and within 180 days    Creat  Date Value Ref Range Status  11/01/2016 0.89 0.50 - 1.10 mg/dL Final   Creatinine, Ser  Date Value Ref Range Status  04/10/2022 0.94 0.44 - 1.00 mg/dL Final   Creatinine, POC  Date Value Ref Range Status  02/01/2017 50 mg/dL Final         Passed - K in normal range and within 180 days    Potassium  Date Value Ref Range Status  04/10/2022 4.3 3.5 - 5.1 mmol/L Final         Passed - Na in normal range and within 180 days    Sodium  Date Value Ref Range Status  04/10/2022 141 135 - 145 mmol/L Final  12/28/2021 141 134 - 144 mmol/L Final         Passed - Last BP in normal range    BP Readings from Last 1 Encounters:  06/26/22 112/79          donepezil (ARICEPT) 5 MG tablet [Pharmacy Med Name: Donepezil HCl 5MG  TABS] 47 tablet 0    Sig: TAKE 1 TABLET BY MOUTH AT BEDTIME     Neurology:  Alzheimer's Agents Failed -  08/16/2022  5:12 PM      Failed - Valid encounter within last 6 months    Recent Outpatient Visits           3 months ago Prolonged Q-T interval on ECG   [facility] [place], [facility], NP   6 months ago Acute pyelonephritis   [facility] Shea Stakes, NP   7 months ago Pain of left breast   [facility] [place] Rigg, NP   1 year ago Burning with urination   [facility] [place], Claiborne Rigg, NP   1 year ago Essential hypertension   [place] [place], Shea Stakes, NP       Future Appointments             In 1 month [place], NP [facility] [place] [facility]  Vitamin D, Ergocalciferol, (DRISDOL) 1.25 MG (50000 UNIT) CAPS capsule [Pharmacy Med Name: Vitamin D (Ergocalciferol) 1.25 MG(50000 UT) CAPS] 4 capsule     Sig: TAKE 1 CAPSULE BY MOUTH ONCE A WEEK     Endocrinology:  Vitamins - Vitamin D Supplementation 2 Failed - 08/16/2022  5:12 PM      Failed - Manual Review: Route requests for 50,000 IU strength to the provider      Failed - Vitamin D in normal range and within 360 days    Vit D, 25-Hydroxy  Date Value Ref Range Status  04/10/2022 23.35 (L) 30 - 100 ng/mL Final    Comment:    (NOTE) Vitamin D deficiency has been defined by the Institute of Medicine  and an Endocrine Society practice guideline as a level of serum 25-OH  vitamin D less than 20 ng/mL (1,2). The Endocrine Society went on to  further define vitamin D insufficiency as a level between 21 and 29  ng/mL (2).  1. IOM (Institute of Medicine). 2010. Dietary reference intakes for  calcium and D. [place]: The [employer]. 2. Holick MF, Binkley Angie, Bischoff-Ferrari HA, et al. Evaluation,  treatment, and prevention of vitamin D deficiency: an Endocrine  Society clinical practice guideline, JCEM. 2011 Jul; 96(7):  1911-30.  Performed at [facility], [address] [place], [place] [address]          Passed - Ca in normal range and within 360 days    Calcium  Date Value Ref Range Status  04/10/2022 9.4 8.9 - 10.3 mg/dL Final         Passed - Valid encounter within last 12 months    Recent Outpatient Visits           3 months ago Prolonged Q-T interval on ECG   [facility] [place], Shea Stakes, NP   6 months ago Acute pyelonephritis   [facility] Claiborne Rigg, NP   7 months ago Pain of left breast   [facility] Claiborne Rigg, NP   1 year ago Burning with urination   [facility] Claiborne Rigg, NP   1 year ago Essential hypertension   [place] [facility] Claiborne Rigg, NP       Future Appointments             In 1 month Claiborne Rigg, NP [facility]

## 2022-08-17 NOTE — Telephone Encounter (Signed)
Requested medication (s) are due for refill today: yes  Requested medication (s) are on the active medication list: yes  Last refill:  07/03/22 #4  Future visit scheduled: yes  Notes to clinic: med not delegated to NT to reorder   Requested Prescriptions  Pending Prescriptions Disp Refills   donepezil (ARICEPT) 5 MG tablet [Pharmacy Med Name: Donepezil HCl 5MG  TABS] 47 tablet 0    Sig: TAKE 1 TABLET BY MOUTH AT BEDTIME     Neurology:  Alzheimer's Agents Failed - 08/16/2022  5:12 PM      Failed - Valid encounter within last 6 months    Recent Outpatient Visits           3 months ago Prolonged Q-T interval on ECG   [facility] [place], [place], NP   6 months ago Acute pyelonephritis   [facility] [place], [place], NP   7 months ago Pain of left breast   [facility] [place], [place], NP   1 year ago Burning with urination   [place] [place], [place], NP   1 year ago Essential hypertension   [facility] [place], [place], NP       Future Appointments             In 1 month Shea Stakes, NP [facility]             Vitamin D, Ergocalciferol, (DRISDOL) 1.25 MG (50000 UNIT) CAPS capsule [Pharmacy Med Name: Vitamin D (Ergocalciferol) 1.25 MG(50000 UT) CAPS] 4 capsule     Sig: TAKE 1 CAPSULE BY MOUTH ONCE A WEEK     Endocrinology:  Vitamins - Vitamin D Supplementation 2 Failed - 08/16/2022  5:12 PM      Failed - Manual Review: Route requests for 50,000 IU strength to the provider      Failed - Vitamin D in normal range and within 360 days    Vit D, 25-Hydroxy  Date Value Ref Range Status  04/10/2022 23.35 (L) 30 - 100 ng/mL Final    Comment:    (NOTE) Vitamin D deficiency has been defined by the Institute of Medicine  and an Endocrine Society practice guideline as a level of serum 25-OH   vitamin D less than 20 ng/mL (1,2). The Endocrine Society went on to  further define vitamin D insufficiency as a level between 21 and 29  ng/mL (2).  1. IOM (Institute of Medicine). 2010. Dietary reference intakes for  calcium and D. [place]: The 2011. 2. Holick MF, Binkley [place], Bischoff-Ferrari HA, et al. Evaluation,  treatment, and prevention of vitamin D deficiency: an Endocrine  Society clinical practice guideline, JCEM. 2011 Jul; 96(7): 1911-30.  Performed at [facility], [address], [place], [place]          Passed - Ca in normal range and within 360 days    Calcium  Date Value Ref Range Status  04/10/2022 9.4 8.9 - 10.3 mg/dL Final         Passed - Valid encounter within last 12 months    Recent Outpatient Visits           3 months ago Prolonged Q-T interval on ECG   [facility] [place], [place] W, NP   6 months ago Acute pyelonephritis  [place], [place], NP   7 months ago Pain of left breast   [place], [place], NP   1 year ago Burning with urination   [place], Vernia Buff, NP   1 year ago Essential hypertension   [place], Vernia Buff, NP       Future Appointments             In 1 month Gildardo Pounds, NP [place]            Signed Prescriptions Disp Refills   hydrochlorothiazide (HYDRODIURIL) 25 MG tablet 61 tablet 0    Sig: TAKE 1 TABLET BY MOUTH DAILY     Cardiovascular: Diuretics - Thiazide Failed - 08/16/2022  5:12 PM      Failed - Valid encounter within last 6 months    Recent Outpatient Visits           3 months ago Prolonged Q-T interval on ECG   [place], Vernia Buff, NP   6 months ago Acute pyelonephritis   [place], Vernia Buff, NP   7 months ago Pain of left breast   [place], Vernia Buff, NP   1 year ago Burning with urination   [place], Vernia Buff, NP   1 year ago Essential hypertension   [place], Vernia Buff, NP       Future Appointments             In 1 month Gildardo Pounds, NP [place] - Cr in normal range and within 180 days    Creat  Date Value Ref Range Status  11/01/2016 0.89 0.50 - 1.10 mg/dL Final   Creatinine, Ser  Date Value Ref Range Status  04/10/2022 0.94 0.44 - 1.00 mg/dL Final   Creatinine, POC  Date Value Ref Range Status  02/01/2017 50 mg/dL Final         Passed - K in normal range and within 180 days    Potassium  Date Value Ref Range Status  04/10/2022 4.3 3.5 - 5.1 mmol/L Final         Passed - Na in normal range and within 180 days    Sodium  Date Value Ref Range Status  04/10/2022 141 135 - 145 mmol/L Final  12/28/2021 141 134 - 144 mmol/L Final         Passed - Last BP in normal range    BP Readings from Last 1 Encounters:  06/26/22 112/79

## 2022-09-21 ENCOUNTER — Other Ambulatory Visit: Payer: Self-pay | Admitting: Nurse Practitioner

## 2022-10-10 ENCOUNTER — Ambulatory Visit: Payer: Medicare Other | Attending: Family Medicine | Admitting: Nurse Practitioner

## 2022-10-10 ENCOUNTER — Encounter: Payer: Self-pay | Admitting: Nurse Practitioner

## 2022-10-10 VITALS — BP 119/80 | HR 73 | Ht 64.0 in | Wt 198.6 lb

## 2022-10-10 DIAGNOSIS — F0390 Unspecified dementia without behavioral disturbance: Secondary | ICD-10-CM

## 2022-10-10 DIAGNOSIS — Z1231 Encounter for screening mammogram for malignant neoplasm of breast: Secondary | ICD-10-CM

## 2022-10-10 DIAGNOSIS — Z79899 Other long term (current) drug therapy: Secondary | ICD-10-CM | POA: Insufficient documentation

## 2022-10-10 DIAGNOSIS — F250 Schizoaffective disorder, bipolar type: Secondary | ICD-10-CM

## 2022-10-10 DIAGNOSIS — I10 Essential (primary) hypertension: Secondary | ICD-10-CM | POA: Diagnosis not present

## 2022-10-10 DIAGNOSIS — E7800 Pure hypercholesterolemia, unspecified: Secondary | ICD-10-CM | POA: Diagnosis not present

## 2022-10-10 DIAGNOSIS — Z760 Encounter for issue of repeat prescription: Secondary | ICD-10-CM | POA: Diagnosis not present

## 2022-10-10 DIAGNOSIS — Z1211 Encounter for screening for malignant neoplasm of colon: Secondary | ICD-10-CM

## 2022-10-10 DIAGNOSIS — Z862 Personal history of diseases of the blood and blood-forming organs and certain disorders involving the immune mechanism: Secondary | ICD-10-CM | POA: Diagnosis not present

## 2022-10-10 DIAGNOSIS — F1994 Other psychoactive substance use, unspecified with psychoactive substance-induced mood disorder: Secondary | ICD-10-CM

## 2022-10-10 DIAGNOSIS — R7303 Prediabetes: Secondary | ICD-10-CM

## 2022-10-10 DIAGNOSIS — E559 Vitamin D deficiency, unspecified: Secondary | ICD-10-CM

## 2022-10-10 MED ORDER — FLUOXETINE HCL 20 MG PO CAPS: 20.0000 mg | ORAL_CAPSULE | Freq: Every day | ORAL | 1 refills | Status: DC

## 2022-10-10 MED ORDER — DONEPEZIL HCL 5 MG PO TABS: 5.0000 mg | ORAL_TABLET | Freq: Every day | ORAL | 1 refills | Status: DC

## 2022-10-10 MED ORDER — CLONIDINE HCL 0.1 MG PO TABS: 0.1000 mg | ORAL_TABLET | Freq: Every day | ORAL | 0 refills | Status: DC

## 2022-10-10 MED ORDER — HYDROCHLOROTHIAZIDE 25 MG PO TABS: 25.0000 mg | ORAL_TABLET | Freq: Every day | ORAL | 1 refills | Status: DC

## 2022-10-10 MED ORDER — FERROUS SULFATE 325 (65 FE) MG PO TABS: 325.0000 mg | ORAL_TABLET | Freq: Every day | ORAL | 3 refills | Status: DC

## 2022-10-10 NOTE — Progress Notes (Signed)
Assessment & Plan:  Holley was seen today for medication refill.  Diagnoses and all orders for this visit:  Essential hypertension -     hydrochlorothiazide (HYDRODIURIL) 25 MG tablet; Take 1 tablet (25 mg total) by mouth daily. Continue hydrochlorothiazide as prescribed.  Reminded to bring in blood pressure log for follow  up appointment.  RECOMMENDATIONS: DASH/Mediterranean Diets are healthier choices for HTN.    History of anemia -     ferrous sulfate (FEROSUL) 325 (65 FE) MG tablet; Take 1 tablet (325 mg total) by mouth daily with breakfast. -     CBC with Differential  Prediabetes Well-controlled without any oral diabetic medications -     CMP14+EGFR -     Hemoglobin A1c Lab Results  Component Value Date   HGBA1C 5.6 04/07/2022     Vitamin D deficiency disease -     VITAMIN D 25 Hydroxy (Vit-D Deficiency, Fractures)  Hypercholesterolemia -     Lipid panel The 10-year ASCVD risk score ([name] DK, et al., 2019) is: 5.6%   Values used to calculate the score:     Age: 26 years     Sex: Female     Is Non-Hispanic African American: Yes     Diabetic: No     Tobacco smoker: No     Systolic Blood Pressure: 119 mmHg     Is BP treated: Yes     HDL Cholesterol: 34 mg/dL     Total Cholesterol: 237 mg/dL   Colon cancer screening -     Ambulatory referral to Gastroenterology  Breast cancer screening by mammogram -     MM 3D SCREEN BREAST BILATERAL; Future  Major neurocognitive disorder (HCC) -     cloNIDine (CATAPRES) 0.1 MG tablet; Take 1 tablet (0.1 mg total) by mouth at bedtime. -     donepezil (ARICEPT) 5 MG tablet; Take 1 tablet (5 mg total) by mouth at bedtime. -     FLUoxetine (PROZAC) 20 MG capsule; Take 1 capsule (20 mg total) by mouth daily. Follow-up with psychiatry as scheduled.   Patient has been counseled on age-appropriate routine health concerns for screening and prevention. These are reviewed and up-to-date. Referrals have been placed accordingly.  Immunizations are up-to-date or declined.    Subjective:   Chief Complaint  Patient presents with   Medication Refill   Medication Refill Pertinent negatives include no chest pain, coughing, fever, headaches, myalgias, nausea or vomiting.   [name] 52 y.o. female presents to office today for medication refills.   She has a history of bipolar disorder, panic attacks and paranoid schizophrenia. She is followed by psychiatry for her mental health.  I have made courtesy refills on several of her medications today.  Patient has been counseled on age-appropriate routine health concerns for screening and prevention. These are reviewed and up-to-date. Referrals have been placed accordingly. Immunizations are up-to-date or declined.     Mammogram: Up-to-date.  Referred for April Pap smear: Overdue.  Scheduled for next visit Colonoscopy: Overdue.  She has been contacted numerous times to schedule with gastroenterology to no avail.  Will place new orders today.  Blood pressure is well controlled with HCTZ 25 mg daily.  BP Readings from Last 3 Encounters:  10/10/22 119/80  06/26/22 112/79  04/06/22 (!) 105/58     Review of Systems  Constitutional:  Negative for fever, malaise/fatigue and weight loss.  HENT: Negative.  Negative for nosebleeds.   Eyes: Negative.  Negative for blurred vision, double  vision and photophobia.  Respiratory: Negative.  Negative for cough and shortness of breath.   Cardiovascular: Negative.  Negative for chest pain, palpitations and leg swelling.  Gastrointestinal: Negative.  Negative for heartburn, nausea and vomiting.  Musculoskeletal: Negative.  Negative for myalgias.  Neurological: Negative.  Negative for dizziness, focal weakness, seizures and headaches.  Psychiatric/Behavioral:  Positive for depression, hallucinations and memory loss. Negative for suicidal ideas. The patient is nervous/anxious.     Past Medical History:  Diagnosis Date   Anxiety  and depression    Bipolar disorder (HCC)    Cystitis, interstitial    Elevated troponin    a. 09/2015: CP/elevated troponin up to 0.11 - unclear significance. CT neg for PE, LHC neg for CAD, normal echo.   Hypertension    Microcytic anemia    Noted on labs   Panic attack    Paranoid schizophrenia (HCC)    Tobacco abuse     Past Surgical History:  Procedure Laterality Date   CARDIAC CATHETERIZATION N/A 09/29/2015   Procedure: Left Heart Cath and Coronary Angiography;  Surgeon: Corky Crafts, MD;  [facility] INV[facility] CV LAB;  Service: Cardiovascular;  Laterality: N/A;   TUBAL LIGATION      Family History  Problem Relation Age of Onset   Diabetes Father    Hypertension Mother    Hypertension Sister    Lupus Sister    Heart disease Maternal Grandfather    Lupus Other     Social History Reviewed with no changes to be made today.   Outpatient Medications Prior to Visit  Medication Sig Dispense Refill   haloperidol (HALDOL) 0.5 MG tablet Take 0.5 mg by mouth 2 (two) times daily.     melatonin 10 MG TABS Take 10 mg by mouth at bedtime. 30 tablet 0   mirtazapine (REMERON) 15 MG tablet Take 1 tablet (15 mg total) by mouth at bedtime. 30 tablet 0   valbenazine (INGREZZA) 40 MG capsule Take 1 capsule (40 mg total) by mouth daily. 30 capsule 0   Vitamin D, Ergocalciferol, (DRISDOL) 1.25 MG (50000 UNIT) CAPS capsule TAKE 1 CAPSULE BY MOUTH ONCE A WEEK 4 capsule 1   cloNIDine (CATAPRES) 0.1 MG tablet Take 1 tablet (0.1 mg total) by mouth at bedtime. 30 tablet 0   donepezil (ARICEPT) 5 MG tablet TAKE 1 TABLET BY MOUTH AT BEDTIME 30 tablet 2   ferrous sulfate (FEROSUL) 325 (65 FE) MG tablet TAKE 1 TABLET BY MOUTH DAILY WITH BREAKFAST 30 tablet 0   FLUoxetine (PROZAC) 20 MG capsule Take 1 capsule (20 mg total) by mouth daily. 30 capsule 0   hydrochlorothiazide (HYDRODIURIL) 25 MG tablet TAKE 1 TABLET BY MOUTH DAILY 61 tablet 0   QUEtiapine (SEROQUEL) 100 MG tablet Take 1 tablet (100  mg total) by mouth at bedtime. 30 tablet 0   hydrOXYzine (ATARAX) 25 MG tablet Take by mouth.     No facility-administered medications prior to visit.    Allergies  Allergen Reactions   Penicillins Itching and Rash       Objective:    BP 119/80   Pulse 73   Ht 5\' 4"  (1.626 m)   Wt 198 lb 9.6 oz (90.1 kg)   LMP  (LMP Unknown)   SpO2 98%   BMI 34.09 kg/m  Wt Readings from Last 3 Encounters:  10/10/22 198 lb 9.6 oz (90.1 kg)  06/26/22 188 lb (85.3 kg)  01/30/22 188 lb (85.3 kg)    Physical Exam Vitals and  nursing note reviewed.  Constitutional:      Appearance: She is well-developed.  HENT:     Head: Normocephalic and atraumatic.  Cardiovascular:     Rate and Rhythm: Normal rate and regular rhythm.     Heart sounds: Normal heart sounds. No murmur heard.    No friction rub. No gallop.  Pulmonary:     Effort: Pulmonary effort is normal. No tachypnea or respiratory distress.     Breath sounds: Normal breath sounds. No decreased breath sounds, wheezing, rhonchi or rales.  Chest:     Chest wall: No tenderness.  Abdominal:     General: Bowel sounds are normal.     Palpations: Abdomen is soft.  Musculoskeletal:        General: Normal range of motion.     Cervical back: Normal range of motion.  Skin:    General: Skin is warm and dry.  Neurological:     Mental Status: She is alert and oriented to person, place, and time.     Coordination: Coordination normal.  Psychiatric:        Behavior: Behavior normal. Behavior is cooperative.        Thought Content: Thought content normal.        Judgment: Judgment normal.          Patient has been counseled extensively about nutrition and exercise as well as the importance of adherence with medications and regular follow-up. The patient was given clear instructions to go to ER or return to medical center if symptoms don't improve, worsen or new problems develop. The patient verbalized understanding.   Follow-up: Return for  pap smear in april.   Gildardo Pounds, FNP-BC [facility] [place], [place]   10/10/2022, 11:00 AM

## 2022-10-11 ENCOUNTER — Other Ambulatory Visit: Payer: Self-pay | Admitting: Nurse Practitioner

## 2022-10-11 DIAGNOSIS — E7800 Pure hypercholesterolemia, unspecified: Secondary | ICD-10-CM

## 2022-10-11 LAB — LIPID PANEL
Chol/HDL Ratio: 6.3 ratio — ABNORMAL HIGH (ref 0.0–4.4)
Cholesterol, Total: 269 mg/dL — ABNORMAL HIGH (ref 100–199)
HDL: 43 mg/dL (ref >39)
LDL Chol Calc (NIH): 191 mg/dL — ABNORMAL HIGH (ref 0–99)
Triglycerides: 186 mg/dL — ABNORMAL HIGH (ref 0–149)
VLDL Cholesterol Cal: 35 mg/dL (ref 5–40)

## 2022-10-11 LAB — CMP14+EGFR
ALT: 22 IU/L (ref 0–32)
AST: 19 IU/L (ref 0–40)
Albumin/Globulin Ratio: 2 (ref 1.2–2.2)
Albumin: 4.7 g/dL (ref 3.9–4.9)
Alkaline Phosphatase: 80 IU/L (ref 44–121)
BUN/Creatinine Ratio: 13 (ref 9–23)
BUN: 12 mg/dL (ref 6–24)
Bilirubin Total: 0.4 mg/dL (ref 0.0–1.2)
CO2: 21 mmol/L (ref 20–29)
Calcium: 9.9 mg/dL (ref 8.7–10.2)
Chloride: 102 mmol/L (ref 96–106)
Creatinine, Ser: 0.94 mg/dL (ref 0.57–1.00)
Globulin, Total: 2.3 g/dL (ref 1.5–4.5)
Glucose: 91 mg/dL (ref 70–99)
Potassium: 4 mmol/L (ref 3.5–5.2)
Sodium: 139 mmol/L (ref 134–144)
Total Protein: 7 g/dL (ref 6.0–8.5)
eGFR: 74 mL/min/{1.73_m2} (ref >59)

## 2022-10-11 LAB — CBC WITH DIFFERENTIAL/PLATELET
Basophils Absolute: 0 10*3/uL (ref 0.0–0.2)
Basos: 0 %
EOS (ABSOLUTE): 0.1 10*3/uL (ref 0.0–0.4)
Eos: 1 %
Hematocrit: 41.8 % (ref 34.0–46.6)
Hemoglobin: 12.9 g/dL (ref 11.1–15.9)
Immature Grans (Abs): 0 10*3/uL (ref 0.0–0.1)
Immature Granulocytes: 0 %
Lymphocytes Absolute: 2.3 10*3/uL (ref 0.7–3.1)
Lymphs: 47 %
MCH: 23 pg — ABNORMAL LOW (ref 26.6–33.0)
MCHC: 30.9 g/dL — ABNORMAL LOW (ref 31.5–35.7)
MCV: 75 fL — ABNORMAL LOW (ref 79–97)
Monocytes Absolute: 0.3 10*3/uL (ref 0.1–0.9)
Monocytes: 6 %
Neutrophils Absolute: 2.2 10*3/uL (ref 1.4–7.0)
Neutrophils: 46 %
Platelets: 297 10*3/uL (ref 150–450)
RBC: 5.61 x10E6/uL — ABNORMAL HIGH (ref 3.77–5.28)
RDW: 14.9 % (ref 11.7–15.4)
WBC: 4.9 10*3/uL (ref 3.4–10.8)

## 2022-10-11 LAB — HEMOGLOBIN A1C
Est. average glucose Bld gHb Est-mCnc: 123 mg/dL
Hgb A1c MFr Bld: 5.9 % — ABNORMAL HIGH (ref 4.8–5.6)

## 2022-10-11 LAB — VITAMIN D 25 HYDROXY (VIT D DEFICIENCY, FRACTURES): Vit D, 25-Hydroxy: 33.3 ng/mL (ref 30.0–100.0)

## 2022-10-11 MED ORDER — ATORVASTATIN CALCIUM 10 MG PO TABS: 10.0000 mg | ORAL_TABLET | Freq: Every day | ORAL | 3 refills | Status: DC

## 2022-10-18 ENCOUNTER — Other Ambulatory Visit: Payer: Self-pay | Admitting: Family Medicine

## 2022-10-18 NOTE — Telephone Encounter (Signed)
Requested medication (s) are due for refill today: yes  Requested medication (s) are on the active medication list yes  Last refill:  08/17/22  Future visit scheduled: yes  Notes to clinic:  Manual Review: Route requests for 50,000 IU strength to the provider      Requested Prescriptions  Pending Prescriptions Disp Refills   Vitamin D, Ergocalciferol, (DRISDOL) 1.25 MG (50000 UNIT) CAPS capsule [Pharmacy Med Name: Vitamin D (Ergocalciferol) 1.25 MG(50000 UT) CAPS] 4 capsule 1    Sig: TAKE 1 CAPSULE BY MOUTH ONCE A WEEK     Endocrinology:  Vitamins - Vitamin D Supplementation 2 Failed - 10/18/2022  3:24 PM      Failed - Manual Review: Route requests for 50,000 IU strength to the provider      Passed - Ca in normal range and within 360 days    Calcium  Date Value Ref Range Status  10/10/2022 9.9 8.7 - 10.2 mg/dL Final         Passed - Vitamin D in normal range and within 360 days    Vit D, 25-Hydroxy  Date Value Ref Range Status  10/10/2022 33.3 30.0 - 100.0 ng/mL Final    Comment:    Vitamin D deficiency has been defined by the Institute of Medicine and an Endocrine Society practice guideline as a level of serum 25-OH vitamin D less than 20 ng/mL (1,2). The Endocrine Society went on to further define vitamin D insufficiency as a level between 21 and 29 ng/mL (2). 1. IOM (Institute of Medicine). 2010. Dietary reference    intakes for calcium and D. [place]: The    [employer]. 2. Holick MF, Binkley [place]sey, Bischoff-Ferrari HA, et al.    Evaluation, treatment, and prevention of vitamin D    deficiency: an Endocrine Society clinical practice    guideline. JCEM. 2011 Jul; 96(7):1911-30.          Passed - Valid encounter within last 12 months    Recent Outpatient Visits           1 week ago Essential hypertension   [place], [place] W, NP   5 months ago Prolonged Q-T interval on [place], NP   8 months ago Acute pyelonephritis   [place] Gildardo Pounds, NP   9 months ago Pain of left breast   [place], [name], NP   1 year ago Burning with urination   [place], [name], NP       Future Appointments             In 2 months Gildardo Pounds, NP [place]

## 2022-10-25 ENCOUNTER — Ambulatory Visit (HOSPITAL_COMMUNITY)
Admission: EM | Admit: 2022-10-25 | Discharge: 2022-10-25 | Disposition: A | Discharge status: Home / Self Care | Payer: Medicare Other | Attending: Psychiatry | Admitting: Psychiatry

## 2022-10-25 DIAGNOSIS — F0393 Unspecified dementia, unspecified severity, with mood disturbance: Secondary | ICD-10-CM | POA: Diagnosis not present

## 2022-10-25 DIAGNOSIS — F22 Delusional disorders: Secondary | ICD-10-CM

## 2022-10-25 DIAGNOSIS — F200 Paranoid schizophrenia: Secondary | ICD-10-CM | POA: Insufficient documentation

## 2022-10-25 DIAGNOSIS — R440 Auditory hallucinations: Secondary | ICD-10-CM

## 2022-10-25 DIAGNOSIS — Z9151 Personal history of suicidal behavior: Secondary | ICD-10-CM | POA: Insufficient documentation

## 2022-10-25 DIAGNOSIS — G2401 Drug induced subacute dyskinesia: Secondary | ICD-10-CM | POA: Diagnosis not present

## 2022-10-25 MED ORDER — VALBENAZINE TOSYLATE 40 MG PO CAPS
40.0000 mg | ORAL_CAPSULE | Freq: Every day | ORAL | Status: DC
  Filled 2022-10-25: qty 7

## 2022-10-25 NOTE — Progress Notes (Signed)
   10/25/22 1138  [place] (Walk-ins at  [facility] only)  How Did You Hear About [place]? Family/Friend  What Is the Reason for Your Visit/Call Today? Jody [name] is a 52 year old female presenting to [facility] voluntarily with chief complaint of increased paranoid and AH for about 24 hours. Pt possibly having some issues with thought blocking during triage and she is somewhat monosyllabic. Per sister pt met with her psychiatrist on 10/03/22 due to increase paranoia, and her medications were adjusted due to increased tardive dyskinesia. Apparently the psychiatrist did not send the new prescription to the pharmacy so patient continued with normal medication regimen. Pt reports dx of schizophrenia. Per sister, patient was shaking this morning and could not get off the bed.  Sister reports pt is afraid due to voices. Pt denies command hallucinations but unable to state what the voices are saying to her. Sister reports that patient is not at her baseline. Pt denies SI, HI, VH and substance use.  How Long Has This Been Causing You Problems? 1 wk - 1 month  Have You Recently Had Any Thoughts About Hurting Yourself? No  Are You Planning to Commit Suicide/Harm Yourself At This time? No  Have you Recently Had Thoughts About [place]? No  Are You Planning To Harm Someone At This Time? No  Are you currently experiencing any auditory, visual or other hallucinations? No  Have You Used Any Alcohol or Drugs in the Past 24 Hours? No  Do you have any current medical co-morbidities that require immediate attention? No  Clinician description of patient physical appearance/behavior: Scanning the room, anxious  What Do You Feel Would Help You the Most Today? Treatment for Depression or other mood problem  If access to [facility] was not available, would you have sought care in the Emergency Department? No  Determination of Need Routine (7 days)  Options For Referral Medication Management;Outpatient Therapy

## 2022-10-25 NOTE — Discharge Instructions (Signed)

## 2022-10-25 NOTE — ED Provider Notes (Signed)
Behavioral Health Urgent Care Medical Screening Exam  Patient Name: Jody [name] MRN: [id number] Date of Evaluation: 10/25/22 Chief Complaint: "just paranoia" Diagnosis:  Final diagnoses:  Paranoia ([place])  Tardive dyskinesia  Auditory hallucinations   History of Present illness: Jody [name] is a 52 y.o. female. Pt presents voluntarily to [facility] behavioral health for walk-in assessment.  Pt is accompanied by her sister, Jody [name]. Pt is assessed face-to-face by nurse practitioner.   Jody [name], 52 y.o., female patient seen face to face by this provider, consulted with Dr. Dwyane Dee; and chart reviewed on 10/25/22. Per chart review, pt with history of major neurocognitive disorder, tardive dyskinesia, mdd, hallucinations, paranoid schizophrenia, schizoaffective disorder depressive type, tobacco abuse, gad. On evaluation when asked reason for presenting today, Jody [name] reports "just paranoia".  Pt reports anxious, depressed mood. Her affect is flat. She reports good appetite, eating 3 meals/day that she prepares herself. She reports meals she has prepared recently include fish, salad, chicken. Pt reports poor sleep, sleeping 1 hour/night. She states she has difficulty falling and staying asleep.  Pt denies suicidal, homicidal or violent ideations. She endorses paranoia and auditory hallucinations. Initially, she is reluctant to share content of paranoia and auditory hallucinations, stating "don't want to talk about", although does open up. She states she is currently experiencing auditory hallucinations telling her "If I don't stop shaking, I'm going to die." Of note, pt has visible tardive dyskinesia. When asked how she feels about her tardive dyskinesia, she states "if I had to rate it 1 out of 10, 10 being the worst, it's a 10". Pt reports significant distress by tardive dyskinesia. Pt is currently taking Ingrezza 40mg . Pt denies command auditory hallucinations to hurt  herself or hurt anyone else.  Pt denies history of non suicidal self injurious behavior. She reports history of suicide attempt "long time ago", 2 years ago. She reports history of multiple inpatient psychiatric admissions. Most recent admission was at [facility] from 04/08/22-04/12/22. Pt reports previously having been followed by an ACT team [facility]), last followed 2 years ago.   Jody [name] states pt has history of early dementia, schizophrenia, and bipolar disorder. She states pt has been doing well. Pt regained guardianship over herself and no longer has a legal guardian. Jody [name] states pt has outpatient psychiatric services with Dr. Darcey Nora, although they are not happy with their service. Recently, medication changes were supposed to be made, however, outpatient psychiatric services has not followed up.   Discussed increasing Ingrezza from 40mg  to 80mg . Pt has medications in pill packs from [facility]. Will provide 7 day sample of Ingrezza 40mg  so that pt can take this in addition to her pill packs. Pt and Jody [name] agree this would be easier for pt than to provide Ingrezza 80mg  as pt would have to take out the Ingrezza 40mg  from the pill pack. Appointment was scheduled for medication management at [facility] for medication management on 10/27/22 at 9AM and for counseling on 11/07/22 at [place] [facility] (Discharged) from 04/08/2022 in [place] [facility] ED from 04/07/2022 in [facility] ED from 04/06/2022 in [facility] [place] No Risk No Risk No Risk       Psychiatric Specialty Exam  Presentation  General Appearance:Appropriate for Environment; Casual; Fairly Groomed  Eye Contact:Fair  Speech:Clear and Coherent; Other (comment) (potential thought blocking at times)  Speech Volume:Normal  Handedness:Right   Mood and Affect  Mood: Depressed; Anxious  Affect: Flat   Thought Process   Thought Processes: Coherent  Descriptions of Associations:Intact  Orientation:Full (Time, Place and Person)  Thought Content:Logical; Delusions; Paranoid Ideation  Diagnosis of Schizophrenia or Schizoaffective disorder in past: Yes   Hallucinations:Auditory  Ideas of Reference:Delusions; Paranoia  Suicidal Thoughts:No  Homicidal Thoughts:No   Sensorium  Memory: Immediate Good; Recent Fair  Judgment: Fair  Insight: Fair   Materials engineer: Fair  Attention Span: Fair  Recall: [place] of Knowledge: Good  Language: Good   Psychomotor Activity  Psychomotor Activity: Extrapyramidal Side Effects (EPS) Tardive Dyskinesia Yes   Assets  Assets: Communication Skills; Desire for Improvement; Financial Resources/Insurance; Housing; Leisure Time; Resilience; Social Support   Sleep  Sleep: Poor  Number of hours:  0 (1 hour/night)   Physical Exam: Physical Exam Eyes:     General: No scleral icterus. Cardiovascular:     Rate and Rhythm: Tachycardia present.  Pulmonary:     Effort: Pulmonary effort is normal. No respiratory distress.  Neurological:     Mental Status: She is alert and oriented to person, place, and time.     Comments: Tardive dyskinesia  Psychiatric:        Attention and Perception: Attention and perception normal.        Mood and Affect: Mood is anxious and depressed. Affect is flat.        Speech: Speech normal.        Behavior: Behavior normal. Behavior is cooperative.    Review of Systems  Constitutional:  Negative for chills and fever.  Respiratory:  Negative for shortness of breath.   Cardiovascular:  Negative for chest pain and palpitations.  Gastrointestinal:  Negative for abdominal pain.  Neurological:  Negative for headaches.   Blood pressure (!) 147/111, pulse (!) 101, temperature 98.5 F (36.9 C), temperature source Oral, resp. rate 19, SpO2 98 %. There is no height or weight on file to  calculate BMI.  Musculoskeletal: Strength & Muscle Tone: within normal limits Gait & Station: normal Patient leans: N/A  [place] [facility] Discharge Disposition for Follow up and Recommendations: Based on my evaluation the patient does not appear to have an emergency medical condition and can be discharged with resources and follow up care in outpatient services for Medication Management and Individual Therapy  Tharon Aquas, NP 10/25/2022, 5:33 PM

## 2022-10-27 ENCOUNTER — Ambulatory Visit (INDEPENDENT_AMBULATORY_CARE_PROVIDER_SITE_OTHER): Payer: Medicare Other | Admitting: Physician Assistant

## 2022-10-27 ENCOUNTER — Encounter (HOSPITAL_COMMUNITY): Payer: Self-pay | Admitting: Physician Assistant

## 2022-10-27 VITALS — BP 118/91 | HR 131 | Ht 64.0 in | Wt 187.0 lb

## 2022-10-27 DIAGNOSIS — G479 Sleep disorder, unspecified: Secondary | ICD-10-CM

## 2022-10-27 DIAGNOSIS — F411 Generalized anxiety disorder: Secondary | ICD-10-CM

## 2022-10-27 DIAGNOSIS — F0390 Unspecified dementia without behavioral disturbance: Secondary | ICD-10-CM

## 2022-10-27 DIAGNOSIS — G2401 Drug induced subacute dyskinesia: Secondary | ICD-10-CM | POA: Diagnosis not present

## 2022-10-27 DIAGNOSIS — F333 Major depressive disorder, recurrent, severe with psychotic symptoms: Secondary | ICD-10-CM | POA: Diagnosis not present

## 2022-10-27 DIAGNOSIS — F251 Schizoaffective disorder, depressive type: Secondary | ICD-10-CM

## 2022-10-27 MED ORDER — CLONIDINE HCL 0.1 MG PO TABS: 0.1000 mg | ORAL_TABLET | Freq: Every day | ORAL | 1 refills | Status: DC

## 2022-10-27 MED ORDER — MIRTAZAPINE 15 MG PO TABS: 15.0000 mg | ORAL_TABLET | Freq: Every day | ORAL | 1 refills | Status: DC

## 2022-10-27 MED ORDER — MELATONIN 10 MG PO TABS: 10.0000 mg | ORAL_TABLET | Freq: Every day | ORAL | 1 refills | Status: DC

## 2022-10-27 MED ORDER — FLUOXETINE HCL 40 MG PO CAPS: 40.0000 mg | ORAL_CAPSULE | Freq: Every day | ORAL | 1 refills | Status: DC

## 2022-10-27 MED ORDER — VALBENAZINE TOSYLATE 80 MG PO CAPS: 80.0000 mg | ORAL_CAPSULE | Freq: Every day | ORAL | 1 refills | Status: DC

## 2022-10-27 MED ORDER — QUETIAPINE FUMARATE 200 MG PO TABS: 200.0000 mg | ORAL_TABLET | Freq: Every day | ORAL | 1 refills | Status: DC

## 2022-10-27 NOTE — Progress Notes (Signed)
Psychiatric Initial Adult Assessment   Patient Identification: Jody [name] MRN:  [id number] Date of Evaluation:  10/27/2022 Referral Source: Referred by [facility] Chief Complaint:   Chief Complaint  Patient presents with   New Patient (Initial Visit)   Establish Care   Medication Management   Visit Diagnosis:    ICD-10-CM   1. Schizoaffective disorder, depressive type (HCC)  F25.1 QUEtiapine (SEROQUEL) 200 MG tablet    2. Major neurocognitive disorder (HCC)  F03.90 FLUoxetine (PROZAC) 40 MG capsule    cloNIDine (CATAPRES) 0.1 MG tablet    3. GAD (generalized anxiety disorder)  F41.1     4. Tardive dyskinesia  G24.01 valbenazine (INGREZZA) 80 MG capsule    5. MDD (major depressive disorder), recurrent, severe, with psychosis (HCC)  F33.3 FLUoxetine (PROZAC) 40 MG capsule    mirtazapine (REMERON) 15 MG tablet    6. Sleep disturbances  G47.9 Melatonin 10 MG TABS      History of Present Illness:    Jody [name] is a 52 year old, African American female with a past psychiatric history significant for schizoaffective disorder (depressed type), major depressive disorder, and generalized anxiety disorder who presents to [facility], accompanied by her sister Jody [name], [phone]), to establish psychiatric care and for medication management.  Patient presents today stating "I have been having paranoia and shaking."  Patient reports that the shaking has been going on for a couple of days.  She reports that the shaking occurs in both hands but she experiences shaking mainly in the right hand.  Per patient's sister, patient also experiences shaking in the mouth and legs.  Prior to her appointment today, patient's sister states that the patient came in through the ER in regards to her shaking and paranoia.  After her assessment, the provider that assessed the patient increased her Ingrezza from 40 mg to 80 mg daily.   Patient's sister states that the patient has been doing much better since the adjustment of her Ingrezza.  She reports that the patient has been taking 40 mg of Ingrezza in the morning and 40 mg at night.  She reports that the patient will start taking 80 mg total at bedtime starting today.  In regards to her shaking, patient states that her shaking started when she was placed on Haldol for the management of her paranoia.  Patient believes that the introduction of Haldol has made her symptoms worse.  Per patient's sister, they are interested in long-acting injectable options while the patient's medications are being reviewed.  Patient is currently taking the following psychiatric medications: Fluoxetine 20 mg daily, Haldol 5 mg half a tablet 2 times daily, Ingrezza 40 mg daily, melatonin 10 mg at bedtime, mirtazapine 15 mg at bedtime, and Seroquel 100 mg at bedtime.  Patient reports that her paranoia has been going on for a while.  In addition to her paranoia, patient also endorses auditory hallucinations that tell her that if she does not stop shaking, she will die.  She reports that she was also hearing these voices during her assessment in the ER.  Patient reports that the voices are still present and are telling her the same thing.  Patient reports that she has been on Seroquel for a long time with no relief from her symptoms.  In addition to her paranoia, patient endorses getting very little sleep.  Patient states that she was placed on clonidine 0.1 mg at bedtime for the management of her sleep, but the  medication has not been effective.  Patient has been on trazodone in the past but is unsure of why it was discontinued.  Patient reports that she receives roughly an hour sleep and that she wakes up off and on.  Patient also endorses depression and rates her depression at 9 out of 10 with 10 being most severe.  Patient states that she is affected by depression roughly 5 days out of the week.  Patient  depressive episodes are characterized by the following symptoms: low mood, tiredness, decreased concentration, and irritability.  Patient denies crying spells.  Patient endorses anxiety and rates her anxiety as 7 out of 10.  Patient's main stressor is not being able to sleep.  Patient reports that she has been hospitalized in the past and was last hospitalized at [facility] back in June or July.  Patient endorses past history of suicide attempt stating that she tried to cut her wrist many years ago.  A PHQ-9 screen was performed with the patient scoring a 26.  A GAD-7 screen was also performed with the patient scoring a 21.  Patient is alert and oriented x 4, calm, cooperative, and fully engaged in conversation during the encounter.  Patient endorses fair mood.  During the encounter, patient exhibits noticeable tremor in her right hand/arm as well as her legs.  Patient denies suicidal or homicidal ideation.  Patient denies visual hallucinations but does endorse having auditory hallucinations on occasion.  Patient does not appear to be responding to internal/external stimuli at this time.  Patient endorses paranoia but states that it is hard to explain what she is going through.  Patient denies delusional thoughts.  Patient endorses poor sleep and sleeps roughly an hour at a time.  Patient endorses good appetite and eats on average 2-3 meals per day.  Associated Signs/Symptoms: Depression Symptoms:  depressed mood, anhedonia, insomnia, psychomotor agitation, psychomotor retardation, fatigue, feelings of worthlessness/guilt, difficulty concentrating, hopelessness, impaired memory, anxiety, panic attacks, loss of energy/fatigue, disturbed sleep, weight gain, decreased labido, increased appetite, Patient endorses self isolation (Hypo) Manic Symptoms:  Delusions, Distractibility, Flight of Ideas, Irritable Mood, Labiality of Mood, Anxiety Symptoms:  Agoraphobia, Excessive  Worry, Panic Symptoms, Social Anxiety, Psychotic Symptoms:  Delusions, Hallucinations: Auditory Command:  Patient unable to describe her command type hallucinations stating that they were hard to explain Paranoia, PTSD Symptoms: Negative  Past Psychiatric History:  Schizophrenia -diagnosed roughly 7 years ago when patient was attending [facility] Bipolar disorder Tardive dyskinesia Depression  **Onset of dementia  Previous Psychotropic Medications: Yes   Substance Abuse History in the last 12 months:  No.  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  She reports that she last used alcohol 3 months ago.  Past Medical History:  Past Medical History:  Diagnosis Date   Anxiety and depression    Bipolar disorder ([place])    Cystitis, interstitial    Elevated troponin    a. 09/2015: CP/elevated troponin up to 0.11 - unclear significance. CT neg for PE, LHC neg for CAD, normal echo.   Hypertension    Microcytic anemia    Noted on labs   Panic attack    Paranoid schizophrenia ([place])    Tobacco abuse     Past Surgical History:  Procedure Laterality Date   CARDIAC CATHETERIZATION N/A 09/29/2015  Procedure: Left Heart Cath and Coronary Angiography;  Surgeon: Corky Crafts, MD;  Location: [facility] INVASIVE CV LAB;  Service: Cardiovascular;  Laterality: N/A;   TUBAL LIGATION      Family Psychiatric History:  Grandmother (maternal) - unsure of the diagnosis but states that her grandmother had mental health issues Uncle (maternal) - Schizophrenia and bipolar disorder, he is currently taking medications but is unsure of the type.  Family history of suicide: Patient denies Family history of homicide: Patient denies Family history of substance abuse: Per  patient's sister, alcohol abuse runs in the family  Family History:  Family History  Problem Relation Age of Onset   Diabetes Father    Hypertension Mother    Hypertension Sister    Lupus Sister    Heart disease Maternal Grandfather    Lupus Other     Social History:   Social History   Socioeconomic History   Marital status: Single    Spouse name: Not on file   Number of children: 5   Years of education: 12   Highest education level: Not on file  Occupational History   Occupation: Disabled  Tobacco Use   Smoking status: Former    Packs/day: 0.25    Years: 4.00    Total pack years: 1.00    Types: Cigarettes    Quit date: 05/01/2016    Years since quitting: 6.4   Smokeless tobacco: Never  Vaping Use   Vaping Use: Never used  Substance and Sexual Activity   Alcohol use: Not Currently    Alcohol/week: 0.0 standard drinks of alcohol    Comment: Quit 2017   Drug use: No   Sexual activity: Not Currently    Birth control/protection: Condom  Other Topics Concern   Not on file  Social History Narrative   Lives alone   Caffeine use: Soda- daily   Right-handed   Social Determinants of Health   Financial Resource Strain: Not on file  Food Insecurity: Not on file  Transportation Needs: Not on file  Physical Activity: Not on file  Stress: Not on file  Social Connections: Not on file    Additional Social History:  Patient endorses social support through her sisters and her mother.  Patient endorses having children and has 5 children.  Patient endorses housing.  Patient denies employment.  Patient denies a past history of military experience.  Patient denies a past history of prison or jail time.  Highest education earned was 10th grade.  Patient denies there being any weapons in the home.  Allergies:   Allergies  Allergen Reactions   Penicillins Itching and Rash    Metabolic Disorder Labs: Lab Results  Component Value Date   HGBA1C 5.9 (H) 10/10/2022   MPG  114.02 04/07/2022   MPG 117 09/28/2015   Lab Results  Component Value Date   PROLACTIN 17.2 04/07/2022   PROLACTIN 155.0 (H) 12/28/2021   Lab Results  Component Value Date   CHOL 269 (H) 10/10/2022   TRIG 186 (H) 10/10/2022   HDL 43 10/10/2022   CHOLHDL 6.3 (H) 10/10/2022   VLDL 56 (H) 04/09/2022   LDLCALC 191 (H) 10/10/2022   LDLCALC 147 (H) 04/09/2022   Lab Results  Component Value Date   TSH 5.855 (H) 04/07/2022    Therapeutic Level Labs: No results found for: "LITHIUM" No results found for: "CBMZ" Lab Results  Component Value Date   VALPROATE 118 (H) 04/26/2016    Current Medications: Current Outpatient Medications  Medication  Sig Dispense Refill   atorvastatin (LIPITOR) 10 MG tablet Take 1 tablet (10 mg total) by mouth daily. 90 tablet 3   donepezil (ARICEPT) 5 MG tablet Take 1 tablet (5 mg total) by mouth at bedtime. 90 tablet 1   ferrous sulfate (FEROSUL) 325 (65 FE) MG tablet Take 1 tablet (325 mg total) by mouth daily with breakfast. 90 tablet 3   FLUoxetine (PROZAC) 40 MG capsule Take 1 capsule (40 mg total) by mouth daily. 90 capsule 1   haloperidol (HALDOL) 0.5 MG tablet Take 0.5 mg by mouth 2 (two) times daily.     hydrochlorothiazide (HYDRODIURIL) 25 MG tablet Take 1 tablet (25 mg total) by mouth daily. 90 tablet 1   Melatonin 10 MG TABS Take 10 mg by mouth at bedtime. 90 tablet 1   mirtazapine (REMERON) 15 MG tablet Take 1 tablet (15 mg total) by mouth at bedtime. 90 tablet 1   QUEtiapine (SEROQUEL) 200 MG tablet Take 1 tablet (200 mg total) by mouth at bedtime. 90 tablet 1   valbenazine (INGREZZA) 80 MG capsule Take 1 capsule (80 mg total) by mouth daily. 30 capsule 1   Vitamin D, Ergocalciferol, (DRISDOL) 1.25 MG (50000 UNIT) CAPS capsule TAKE 1 CAPSULE BY MOUTH ONCE A WEEK 4 capsule 1   cloNIDine (CATAPRES) 0.1 MG tablet Take 1 tablet (0.1 mg total) by mouth at bedtime. 90 tablet 1   No current facility-administered medications for this visit.     Musculoskeletal: Strength & Muscle Tone: within normal limits Gait & Station: normal Patient leans: N/A  Psychiatric Specialty Exam: Review of Systems  Psychiatric/Behavioral:  Positive for decreased concentration and sleep disturbance. Negative for dysphoric mood, hallucinations, self-injury and suicidal ideas. The patient is nervous/anxious. The patient is not hyperactive.     Blood pressure (!) 118/91, pulse (!) 131, height 5\' 4"  (1.626 m), weight 187 lb (84.8 kg).Body mass index is 32.1 kg/m.  General Appearance: Well Groomed  Eye Contact:  Good  Speech:  Clear and Coherent and Normal Rate  Volume:  Normal  Mood:  Anxious and Depressed  Affect:  Congruent  Thought Process:  Coherent, Goal Directed, and Descriptions of Associations: Intact  Orientation:  Full (Time, Place, and Person)  Thought Content:  Hallucinations: Auditory and Paranoid Ideation  Suicidal Thoughts:  No  Homicidal Thoughts:  No  Memory:  Immediate;   Good Recent;   Fair Remote;   Fair  Judgement:  Good  Insight:  Good  Psychomotor Activity:  Normal  Concentration:  Concentration: Good and Attention Span: Good  Recall:  Good  Fund of Knowledge:Fair  Language: Good  Akathisia:  No  Handed:  Right  AIMS (if indicated):  done  Assets:  Communication Skills Desire for Improvement Housing Social Support  ADL's:  Intact  Cognition: Impaired,  Mild  Sleep:  Poor   Screenings: [place] Office Visit from 10/27/2022 in [facility] Admission (Discharged) from 04/08/2022 in [place] [facility] Admission (Discharged) from 01/29/2015 in [place] [facility]  AIMS Total Score 17 4 0      AUDIT    Flowsheet Row Admission (Discharged) from 01/29/2015 in [place] [facility] Admission (Discharged) from 09/12/2014 in [place] [facility] Admission (Discharged) from  07/03/2014 in [place] [facility]  Alcohol Use Disorder Identification Test Final Score (AUDIT) 9 4 20       GAD-7  Flowsheet Row [facility] from [facility]20[facility] from [facility]20[facility]  Total GAD-7 Score 21 17 21 13 9       Mini-Mental    Flowsheet Row [facility]  Total Score (max 30 points ) 25 11 18 15 26       PHQ2-9    Flowsheet Row [facility] from [facility] from [facility]21 [facility] from [facility]21 [facility]  PHQ-2 Total Score 6 2 3 3 2   PHQ-9 Total Score 26 11 12 12 9       Flowsheet Row [facility]  C-SSRS RISK CATEGORY Low Risk No Risk No Risk       Assessment and Plan:   Jody [name] is a 52 year old, African American female with a past psychiatric history significant for  schizoaffective disorder (depressed type), major depressive disorder, and generalized anxiety disorder [facility], accompanied by her sister Jody [name](Letisha Allen, [phone]), to establish psychiatric care and for medication management.  Patient presents today with a chief complaint of ongoing paranoia and tremors.  Patient has been dealing with tremors in her mouth, arms/hands (mainly in her right hand) and legs. Patient reports that her paranoia has been worsening since the addition of Haldol.  [facility] and was recommended increasing her dosage of Ingrezza from 40 mg to 80 mg daily for the management of her tardive dyskinesia. In addition to her tremors and paranoia, patient endorses depression and anxiety.  Patient is currently taking the following medications: Fluoxetine 20 mg daily, Haldol 5 mg half tablet 2 times daily, and Ingrezza 40 mg daily, melatonin 2 mg at bedtime, mirtazapine 15 mg at bedtime, and Seroquel 100 mg at bedtime.   Due to patient's worsening paranoia and tremors with the addition of Haldol, provider recommended to patient to discontinue Haldol.  Provider recommended patient take Haldol 5 mg half tablet daily for 2 weeks before discontinuing.  For the management of her tremors, provider increased patient's Ingrezza from 40 mg to 80 mg daily for the management of her tardive dyskinesia.  Patient was also recommended increasing her fluoxetine from 20 mg to 40 mg daily for the management of her depression and anxiety.  Lastly, patient was recommended increasing her Seroquel from 100 mg to 200 mg at bedtime for the management of her paranoia and auditory hallucinations.  Patient was agreeable to recommendations.  Patient medications to be e-prescribed to pharmacy of choice.  Collaboration of Care: Medication Management AEB provider managing  patient's psychiatric medications, Primary Care Provider AEB patient being  seen by primary care provider at [facility], Psychiatrist AEB patient being followed by mental health provider, and Referral or follow-up with counselor/therapist AEB patient being set up with a licensed clinical social worker at this facility  Patient/Guardian was advised Release of Information must be obtained prior to any record release in order to collaborate their care with an outside provider. Patient/Guardian was advised if they have not already done so to contact the registration department to sign all necessary forms in order for [place] to release information regarding their care.   Consent: Patient/Guardian gives verbal consent for treatment and assignment of benefits for services provided during this visit. Patient/Guardian expressed understanding and agreed to proceed.   1. Major neurocognitive disorder (HCC)  - FLUoxetine (PROZAC) 40 MG capsule; Take 1 capsule (40 mg total) by mouth daily.  Dispense: 90 capsule; Refill: 1 - cloNIDine (CATAPRES) 0.1 MG tablet; Take 1 tablet (0.1 mg total) by mouth at bedtime.  Dispense: 90 tablet; Refill: 1  2. Schizoaffective disorder, depressive type (HCC)  - QUEtiapine (SEROQUEL) 200 MG tablet; Take 1 tablet (200 mg total) by mouth at bedtime.  Dispense: 90 tablet; Refill: 1  3. GAD (generalized anxiety disorder)   4. Tardive dyskinesia  - valbenazine (INGREZZA) 80 MG capsule; Take 1 capsule (80 mg total) by mouth daily.  Dispense: 30 capsule; Refill: 1  5. MDD (major depressive disorder), recurrent, severe, with psychosis (HCC)  - FLUoxetine (PROZAC) 40 MG capsule; Take 1 capsule (40 mg total) by mouth daily.  Dispense: 90 capsule; Refill: 1 - mirtazapine (REMERON) 15 MG tablet; Take 1 tablet (15 mg total) by mouth at bedtime.  Dispense: 90 tablet; Refill: 1  6. Sleep disturbances  - Melatonin 10 MG TABS; Take 10 mg by mouth at bedtime.  Dispense: 90 tablet; Refill: 1  Patient to follow-up in 6 weeks Provider spent  a total of 65 minutes with the patient/reviewing the patient's chart  Meta Hatchet, PA 1/26/202411:44 AM

## 2022-10-31 ENCOUNTER — Ambulatory Visit: Payer: Medicare Other | Admitting: Family Medicine

## 2022-10-31 ENCOUNTER — Ambulatory Visit (HOSPITAL_COMMUNITY)
Admission: EM | Admit: 2022-10-31 | Discharge: 2022-11-01 | Disposition: A | Discharge status: Home / Self Care | Payer: Medicare Other | Attending: Nurse Practitioner | Admitting: Nurse Practitioner

## 2022-10-31 DIAGNOSIS — F409 Phobic anxiety disorder, unspecified: Secondary | ICD-10-CM | POA: Diagnosis not present

## 2022-10-31 DIAGNOSIS — F251 Schizoaffective disorder, depressive type: Secondary | ICD-10-CM | POA: Insufficient documentation

## 2022-10-31 DIAGNOSIS — F5105 Insomnia due to other mental disorder: Secondary | ICD-10-CM | POA: Diagnosis not present

## 2022-10-31 DIAGNOSIS — Z79899 Other long term (current) drug therapy: Secondary | ICD-10-CM | POA: Insufficient documentation

## 2022-10-31 NOTE — ED Triage Notes (Signed)
Pt presents to [facility] voluntarily, accompanied by her sister with complaint of auditory hallucinations. Per sister, pt was seen recently (10/27/21) by [facility]) and medications were changed. Per sister, pt has been pacing, screaming and believing that she is going to die due to her shaking/tremors. Per sister, pt met with her psychiatrist on 10/03/22 due to increase paranoia, and her medications were adjusted due to increased tardive dyskinesia. Pt has first therapy session on Feb 6 with [facility]. Pt denies SI, HI and substance/alcohol use.

## 2022-10-31 NOTE — Discharge Instructions (Addendum)

## 2022-10-31 NOTE — ED Provider Notes (Signed)
Behavioral Health Urgent Care Medical Screening Exam  Patient Name: Jody [name] MRN: [id number] Date of Evaluation: 11/01/22 Chief Complaint:  "Insomnia due to paranoia and anxiety" Diagnosis:  Final diagnoses:  Insomnia due to anxiety and fear    History of Present illness: Jody [name] is a 52 y.o. female with psychiatric history of schizoaffective disorder depressive type, major neurocognitive disorder, GAD, tardive dyskinesia, MDD with psychosis, and sleep disturbances, who presented voluntarily as a walk-in to [facility] accompanied by her sister Jody [name] [phone]) with complaints of sleep disturbances due to paranoia and anxiety.  Pt has a legal guardian Jody [name] [phone]).  Patient was seen face-to-face by this provider and chart reviewed.  Per chart review, patient was recently seen on 10/27/2022 in the [facility] outpatient walk-in clinic to establish psychiatric care and medication management.  During that encounter, the patient had complained of paranoia, auditory hallucinations and worsened shaking/involuntary muscle movements after taking Haldol that was prescribed for management of her paranoia after an unrelated ED visit.  Patient's medications were adjusted and she is now on haldol taper for the next two weeks, with a follow-up appointment in 6 weeks.  Patient also has an upcoming therapy appointment for February 6th.   Patient reports the reason for her visit tonight is because she is unable to sleep due to her paranoia and anxiety.  Patient identifies her current stressor is insomnia. Patient reports she takes her bedtime medications about 6 PM daily, including her sleeping pills.  Patient reports she falls asleep but is unable to stay asleep as a result and wakes up restless and anxious.  Patient reports she lives alone, but her sister lives close by and helps her manage her medications.  Patient denies illicit/recreational substance use.  Patient  denies means or access to gun or weapon. Patient reports her sleep is poor and appetite is good. Patient denies SI, denies HI, denies AVH.  Patient reports she only has "her own thoughts" when questioned about auditory hallucinations.  Patient denies command auditory hallucinations.  Patient reports "if I just believe everything is going to be okay, everything is going to be okay, but for some reason I can't just bring myself, but I want to believe that God is going to work things out for me and everything is going to be okay".  Patient reports a history of 3 inpatient psychiatric hospitalizations, with last admission in July 2023.  Patient denies history of suicide attempts or self-harm behaviors.  Patient reports she is unable to state all of the medications she is taking, but they are organized in daily packs which she takes as scheduled.  Patient reports she is taking her medications as prescribed.   Support, encouragement, and reassurance provided about ongoing stressors. Patient is provided with opportunity for questions. Discussed need for time adjustment of patient's sleep medication later than 6pm.  Discussed need for continued tapering of the haldol until the stop date. Discussed need to take all meds as prescribed for better clinical outcomes/improvement. Patient and sister verbalized their understanding.    Collateral information was obtained from the patient's sister Jody [name] who reports the patient has expressed a fear of dyinh for the past week, and also hearing voices telling her if she don't stop shaking, she's gonna die. Jody [name] reports the patient has been taking 5 mg of haldol total daily and ongoing with plan to take same until the two weeks stop. Discussed medication taper weaning meaning, with goal of gradually tapering/decreasing the total daily  dose, with goal of discontinuing the medication. Patient and her sister verbalized their understanding.  Jody [name] reports she  will reorganize the patient's medications to reflect her understanding.  The patient is currently managed on Remeron and fluoxetine for MDD, Ingrezza for TD, Seroquel for schizoaffective disorder, melatonin for sleep disturbances, Prozac and clonidine for major neurocognitive disorder.  On evaluation, patient is alert, oriented x 3, and cooperative. Speech is clear, and normal rate. Pt appears well groomed. Eye contact is good. Mood is anxious, affect is congruent with mood. Thought process is coherent and thought content is WDL. Pt denies SI/HI/AVH. There is no objective indication that the patient is responding to internal stimuli. No delusions elicited during this assessment.     [place] ED from 10/31/2022 in [facility] Office Visit from 10/27/2022 in [facility] Admission (Discharged) from 04/08/2022 in [place] [facility]  C-SSRS RISK CATEGORY No Risk Low Risk No Risk       Psychiatric Specialty Exam  Presentation  General Appearance:Appropriate for Environment  Eye Contact:Good  Speech:Normal Rate  Speech Volume:Normal  Handedness:Right   Mood and Affect  Mood: Anxious  Affect: Congruent   Thought Process  Thought Processes: Coherent  Descriptions of Associations:Intact  Orientation:Full (Time, Place and Person)  Thought Content:WDL  Diagnosis of Schizophrenia or Schizoaffective disorder in past: Yes  Duration of Psychotic Symptoms: Greater than six months  Hallucinations:None; Other (comment) (Pt reports hallucinations as "my own thoughts".)  Ideas of Reference:Paranoia  Suicidal Thoughts:No  Homicidal Thoughts:No   Sensorium  Memory: Immediate Fair  Judgment: Fair  Insight: Fair   Materials engineer: Fair  Attention Span: Fair  Recall: [employer] of Knowledge: Fair  Language: Fair   Psychomotor Activity  Psychomotor  Activity: Extrapyramidal Side Effects (EPS) Tardive Dyskinesia Other (comment) (Completed with priori visits)   Assets  Assets: Communication Skills; Desire for Improvement; Social Support; Resilience   Sleep  Sleep: Poor  Number of hours:  0 (1 hour/night)   Physical Exam: Physical Exam Constitutional:      General: She is not in acute distress.    Appearance: She is not diaphoretic.  HENT:     Head: Normocephalic.     Right Ear: External ear normal.     Left Ear: External ear normal.     Nose: No congestion.  Eyes:     General:        Right eye: No discharge.        Left eye: No discharge.  Cardiovascular:     Rate and Rhythm: Normal rate.  Neurological:     Mental Status: She is alert.    ROS Blood pressure (!) 143/87, pulse 98, temperature 99.6 F (37.6 C), temperature source Oral, resp. rate 20, SpO2 99 %. There is no height or weight on file to calculate BMI.  Musculoskeletal: Strength & Muscle Tone:  Repetitive involuntary movements due to TD Gait & Station: normal Patient leans: N/A   [facility] MSE Discharge Disposition for Follow up and Recommendations: Based on my evaluation the patient does not appear to have an emergency medical condition and can be discharged with resources and follow up care in outpatient services for Medication Management and Individual Therapy  Recommend discharge home and follow-up with outpatient psychiatric services for medication management and therapy.  The patient does not meet inpatient psychiatric admission criteria or IVC criteria.  There is no evidence of imminent risk of harm to self  or others.  Discussed need for continued tapering of the haldol until the stop date. Discussed need to take all meds as prescribed for better clinical outcomes/improvement. Discussed need for time adjustment of patient's sleep medication later than 6pm. Patient and sister verbalized their understanding.   Discussed methods to reduce the risk  of self-injury or suicide attempts: Frequent conversations regarding unsafe thoughts. Remove all significant sharps. Remove all firearms. Remove all medications, including over-the-counter meds. Consider lockbox for medications and having a responsible person dispense medications until patient has strengthened coping skills. Room checks for sharps or other harmful objects. Secure all chemical substances that can be ingested or inhaled.   Please refrain from using alcohol or illicit substances, as they can affect your mood and can cause depression, anxiety or other concerning symptoms.  Alcohol can increase the chance that a person will make reckless decisions, like attempting suicide, and can increase the lethality of a drug overdose.    Discussed crisis plan, calling 911, or going to the ED if condition changes or worsens.  Patient and her sister verbalized their understanding.  Patient discharged home and condition at discharge is stable  Randon Goldsmith, NP 11/01/2022, 12:34 AM

## 2022-10-31 NOTE — ED Provider Notes (Incomplete)
Behavioral Health Urgent Care Medical Screening Exam  Patient Name: Jody [name] MRN: [id number] Date of Evaluation: 10/31/22 Chief Complaint:  "Insomnia due to paranoia and anxiety" Diagnosis:  Final diagnoses:  Insomnia due to anxiety and fear    History of Present illness: Jody [name] is a 52 y.o. female with psychiatric history of schizoaffective disorder depressive type, major neurocognitive disorder, GAD, tardive dyskinesia, MDD with psychosis, and sleep disturbances, who presented voluntarily as a walk-in to [facility] accompanied by her sister Jody [name] with complaints of sleep disturbances due to paranoia and anxiety. Pt has a legal guardian Jody [name]).  Patient was seen face-to-face by this provider and chart reviewed.  Per chart review, patient was recently seen on 10/27/2022 in the [facility] outpatient walk-in clinic to establish psychiatric care and medication management.  During that encounter, the patient had complained of paranoia, auditory hallucinations and shaking/involuntary muscle movements after taking Haldol that was prescribed for management of her paranoia. Patient believes that the introduction of Haldol has made her symptoms worse, and at her last outpatient psych visit, she was placed on haldol taper for the next two weeks.   Patient reports the reason for her visit tonight is because she is unable to sleep due to her paranoia and anxiety. Patient reports she takes her bedtime medications about 6 PM daily, including her sleeping pills.  Patient reports she falls asleep but is unable to stay asleep as a result and wakes up restless and anxious.  Patient reports she lives alone, but her sister lives close by and helps her manage her health.  Patient denies illicit/recreational substance use.  Patient reports her sleep is poor and appetite is good. Patient denies SI, denies HI, denies AVH.  Patient reports she only has "own thoughts" when questioned about  auditory hallucinations.  Patient denies command auditory hallucinations.  When asked about her stressors or triggers for her paranoia, patient reports "if I just believe everything is going to be okay, everything is going to be okay, but for some reason I can't just bring myself, but I want to believe that God is going to work things out for me and everything is going to be okay".  Patient reports a history of 3 inpatient psychiatric hospitalizations, with last admission in July 2023.  Patient denies history of suicide attempts or self-harm behaviors.  Patient reports she is unable to state all of the medications she is taking, but they are organized in daily packs which she takes as scheduled.  Patient reports she is taking her medications as prescribed with mostly concerned because of insomnia.  Support, encouragement, and reassurance provided about ongoing stressors. Patient is provided with opportunity for questions.  Collateral information was obtained from the patient's sister Jody [name] who reports the patient has expressed a fear of dyinh for the past week, and also hearing voices telling her i    [place] ED from 10/31/2022 in [facility] Office Visit from 10/27/2022 in [facility] Admission (Discharged) from 04/08/2022 in [place] [facility]  C-SSRS RISK CATEGORY No Risk Low Risk No Risk       Psychiatric Specialty Exam  Presentation  General Appearance:Appropriate for Environment  Eye Contact:Good  Speech:Normal Rate  Speech Volume:Normal  Handedness:Right   Mood and Affect  Mood: Anxious  Affect: Congruent   Thought Process  Thought Processes: Coherent  Descriptions of Associations:Intact  Orientation:Full (Time, Place and Person)  Thought Content:WDL  Diagnosis of Schizophrenia or Schizoaffective disorder in past: Yes  Duration of Psychotic Symptoms: Greater than  six months  Hallucinations:None; Other (comment) (Pt reports hallucinations as "my own thoughts".)  Ideas of Reference:Paranoia  Suicidal Thoughts:No  Homicidal Thoughts:No   Sensorium  Memory: Immediate Fair  Judgment: Fair  Insight: Fair   Materials engineer: Fair  Attention Span: Fair  Recall: [employer] of Knowledge: Fair  Language: Fair   Psychomotor Activity  Psychomotor Activity: Extrapyramidal Side Effects (EPS) Tardive Dyskinesia Other (comment) (Completed with priori visits)   Assets  Assets: Communication Skills; Desire for Improvement; Social Support; Resilience   Sleep  Sleep: Poor  Number of hours:  0 (1 hour/night)   Physical Exam: Physical Exam Constitutional:      General: She is not in acute distress.    Appearance: She is not diaphoretic.  HENT:     Head: Normocephalic.     Right Ear: External ear normal.     Left Ear: External ear normal.     Nose: No congestion.  Eyes:     General:        Right eye: No discharge.        Left eye: No discharge.  Cardiovascular:     Rate and Rhythm: Normal rate.  Neurological:     Mental Status: She is alert.    ROS Blood pressure (!) 134/105, pulse (!) 120, temperature 98.7 F (37.1 C), temperature source Oral, resp. rate 20, SpO2 99 %. There is no height or weight on file to calculate BMI.  Musculoskeletal: Strength & Muscle Tone:  Repetitive involuntary movements due to TD Gait & Station: normal Patient leans: N/A   [facility] MSE Discharge Disposition for Follow up and Recommendations: Based on my evaluation the patient does not appear to have an emergency medical condition and can be discharged with resources and follow up care in outpatient services for Medication Management and Individual Therapy   Terrelle Ruffolo Carolan Shiver, NP 10/31/2022, 11:43 PM

## 2022-11-05 ENCOUNTER — Ambulatory Visit (HOSPITAL_COMMUNITY)
Admission: EM | Admit: 2022-11-05 | Discharge: 2022-11-05 | Disposition: A | Discharge status: Home / Self Care | Payer: Medicare Other | Attending: Psychiatry | Admitting: Psychiatry

## 2022-11-05 DIAGNOSIS — F251 Schizoaffective disorder, depressive type: Secondary | ICD-10-CM | POA: Insufficient documentation

## 2022-11-05 DIAGNOSIS — F0390 Unspecified dementia without behavioral disturbance: Secondary | ICD-10-CM | POA: Insufficient documentation

## 2022-11-05 DIAGNOSIS — G479 Sleep disorder, unspecified: Secondary | ICD-10-CM | POA: Diagnosis not present

## 2022-11-05 DIAGNOSIS — F329 Major depressive disorder, single episode, unspecified: Secondary | ICD-10-CM | POA: Diagnosis not present

## 2022-11-05 DIAGNOSIS — G2401 Drug induced subacute dyskinesia: Secondary | ICD-10-CM | POA: Insufficient documentation

## 2022-11-05 DIAGNOSIS — Z79899 Other long term (current) drug therapy: Secondary | ICD-10-CM | POA: Insufficient documentation

## 2022-11-05 DIAGNOSIS — F411 Generalized anxiety disorder: Secondary | ICD-10-CM | POA: Diagnosis not present

## 2022-11-05 MED ORDER — HYDROXYZINE HCL 50 MG PO TABS: 50.0000 mg | ORAL_TABLET | Freq: Three times a day (TID) | ORAL | 0 refills | Status: DC | PRN

## 2022-11-05 NOTE — ED Notes (Signed)
Patient was discharged to home by provider. Patient was given AVS and community resources.

## 2022-11-05 NOTE — ED Triage Notes (Signed)
Jody [name] is a 52 year old female presenting to [facility] voluntarily accompanied by her sister with chief complaint of auditory hallucinations and tremors associated with tardive dyskinesia. Pts sister states she was prescribed a strong dosage of haldol at [facility] which caused her tardive dyskinesia. Pts sister states that she has since started seeing a new provider for medication management at [facility] and her last visit was last week, they are decreasing her dosage but she would like something in the meantime to assist with the tremors. Pt reports auditory hallucinations earlier today, hearing a voice telling her that different colored cars are a sign that her time is coming to an end. Pt denies any hallucinations at this time. Pt denies SI/HI and AVH.

## 2022-11-05 NOTE — ED Provider Notes (Signed)
Behavioral Health Urgent Care Medical Screening Exam  Patient Name: Jody [name] MRN: [id number] Date of Evaluation: 11/05/22 Chief Complaint:  "involuntary movements".  Diagnosis:  Final diagnoses:  Tardive dyskinesia    History of Present illness: Jody [name] is a 52 y.o. female patient presented to [facility] as a walk in voluntarily accompanied by her sister Jody [name] [phone] with complaints of "involuntary movements"  Jody [name], 52 y.o., female patient seen face to face by this provider, consulted with Dr. Winfred Leeds; and chart reviewed on 11/05/22.  Per chart review patient has a past psychiatric history of schizoaffective disorder depressed type, major neurocognitive disorder, GAD, tardive dyskinesia, MDD, and sleep disturbance.  She has outpatient services in place with behavioral health services on the second floor with Jody Post PA.  She is prescribed Lipitor 10 mg daily Aricept 5 mg nightly, Prozac 40 mg daily, Haldol 0.5 mg twice daily, hydrochlorothiazide 25 mg daily, melatonin 10 mg nightly, Remeron 15 mg nightly, Seroquel 200 mg nightly, Ingrezza 80 mg daily and clonidine 0.1 mg nightly.  On evaluation Jody [name] reports to have services in place with [facility] a few months ago.  She was prescribed Haldol 5 mg daily and began to have involuntary movements related to medication.  She discontinued services with [facility] and began seeing Jody Post PA with Behavioral health at patient services on the second floor and her next appointment is not until March.  She was prescribed Ingrezza 40 mg and  roughly 1.5 weeks ago her provider increased it to 80 mg daily.  Since that time she has noticed an increase in her involuntary movements.  She is observed sitting with her legs feet hands and arms shaking.  She has some facial movements and at times makes a popping sound with her mouth.  Aims score was 4 in 4 body areas.  She presents today seeking medication  management to help with her involuntary movements.  During evaluation Jody [name]*is present during the assessment with patient's permission.  Patient is alert/oriented x 4, cooperative, and attentive.  She is visibly anxious.  She has normal speech.  He endorses an increase in her anxiety due to her involuntary movements.  She feels like her depression is stable.  She denies any concerns with sleep or appetite.  She is denying SI/HI/AVH.  She verbally contracts for safety.  She does not appear to be responding to internal/external stimuli.  She was able to answer questions appropriately.  Instructed patient to decrease Ingrezza to 40 mg daily.  Provided printed prescription for hydroxyzine 50 mg 3 times daily if needed for anxiety.  Educated patient on medication adverse reactions.  Educated her not to drive as medication can make her sleepy.  Instructed patient to contact her outpatient psychiatric provider to discuss symptoms.  Provided open access walk-in hours for behavioral health services.  Reports she may present in the a.m. for walk-in appointment to see a provider to discuss medications. [place] ED from 11/05/2022 in [facility] ED from 10/31/2022 in [facility] Office Visit from 10/27/2022 in [place] No Risk No Risk Low Risk       Psychiatric Specialty Exam  Presentation  General Appearance:Appropriate for Environment  Eye Contact:Good  Speech:Normal Rate  Speech Volume:Normal  Handedness:Right   Mood and Affect  Mood: Anxious  Affect: Congruent   Thought Process  Thought Processes:  Coherent  Descriptions of Associations:Intact  Orientation:Full (Time, Place and Person)  Thought Content:WDL  Diagnosis of Schizophrenia or Schizoaffective disorder in past: Yes  Duration of Psychotic Symptoms: Greater than six months  Hallucinations:None;  Other (comment) (Pt reports hallucinations as "my own thoughts".)  Ideas of Reference:Paranoia  Suicidal Thoughts:No  Homicidal Thoughts:No   Sensorium  Memory: Immediate Fair  Judgment: Fair  Insight: Fair   Materials engineer: Fair  Attention Span: Fair  Recall: [employer] of Knowledge: Fair  Language: Fair   Psychomotor Activity  Psychomotor Activity: Extrapyramidal Side Effects (EPS) Tardive Dyskinesia Other (comment) (Completed with priori visits)   Assets  Assets: Communication Skills; Desire for Improvement; Social Support; Resilience   Sleep  Sleep: Poor  Number of hours:  0 (1 hour/night)   Physical Exam: Physical Exam Vitals and nursing note reviewed.  Constitutional:      General: She is not in acute distress.    Appearance: Normal appearance. She is not ill-appearing.  HENT:     Head: Normocephalic.  Eyes:     General:        Right eye: No discharge.        Left eye: No discharge.     Conjunctiva/sclera: Conjunctivae normal.  Cardiovascular:     Rate and Rhythm: Normal rate.  Pulmonary:     Effort: Pulmonary effort is normal.  Musculoskeletal:        General: Normal range of motion.     Cervical back: Normal range of motion.  Skin:    Coloration: Skin is not jaundiced or pale.  Neurological:     Mental Status: She is alert and oriented to person, place, and time.  Psychiatric:        Attention and Perception: Attention and perception normal.        Mood and Affect: Affect normal. Mood is anxious.        Speech: Speech normal.        Behavior: Behavior normal. Behavior is cooperative.        Thought Content: Thought content normal.        Cognition and Memory: Cognition normal.        Judgment: Judgment normal.    Review of Systems  Constitutional: Negative.   HENT: Negative.    Eyes: Negative.   Respiratory: Negative.    Cardiovascular: Negative.   Musculoskeletal: Negative.   Skin: Negative.    Neurological: Negative.        Shaking in hands arms feet and legs. Has some movement in her mouth and makes a popping noise.   Psychiatric/Behavioral:  The patient is nervous/anxious.    Blood pressure (!) 145/101, pulse 91, temperature 98.5 F (36.9 C), temperature source Oral, resp. rate 18, SpO2 95 %. There is no height or weight on file to calculate BMI.  Musculoskeletal: Strength & Muscle Tone: within normal limits Gait & Station: normal Patient leans: N/A   [place] MSE Discharge Disposition for Follow up and Recommendations: Based on my evaluation the patient does not appear to have an emergency medical condition and can be discharged with resources and follow up care in outpatient services for Medication Management and Individual Therapy  Discharge patient.   Provided printed prescription for hydroxyzine 50 mg 3 times daily if needed for anxiety.   Instructed patient to contact her outpatient psychiatric provider to discuss symptoms.  Provided open access walk-in hours for behavioral health services.      Revonda Humphrey, NP 11/05/2022, 3:42  PM

## 2022-11-05 NOTE — Discharge Instructions (Addendum)
Please follow up with out patient services in the AM. There are open access walk in hours from 8-11am. Please arrive by 7:15 am to the 2cd floor and check in with receptionist.   Please decrease Ingrezza to 40 mg daily.   Start Hydroxyzine 50 mg TID as needed for anxiety.

## 2022-11-07 ENCOUNTER — Encounter (HOSPITAL_COMMUNITY): Payer: Self-pay

## 2022-11-07 ENCOUNTER — Encounter (HOSPITAL_COMMUNITY): Payer: Self-pay | Admitting: Physician Assistant

## 2022-11-07 ENCOUNTER — Ambulatory Visit (INDEPENDENT_AMBULATORY_CARE_PROVIDER_SITE_OTHER): Payer: Medicare Other | Admitting: Clinical

## 2022-11-07 ENCOUNTER — Ambulatory Visit (INDEPENDENT_AMBULATORY_CARE_PROVIDER_SITE_OTHER): Payer: Medicare Other | Admitting: Physician Assistant

## 2022-11-07 ENCOUNTER — Encounter (HOSPITAL_COMMUNITY): Payer: Self-pay | Admitting: Clinical

## 2022-11-07 VITALS — BP 115/85 | HR 92 | Wt 184.8 lb

## 2022-11-07 DIAGNOSIS — G2589 Other specified extrapyramidal and movement disorders: Secondary | ICD-10-CM | POA: Diagnosis not present

## 2022-11-07 DIAGNOSIS — F251 Schizoaffective disorder, depressive type: Secondary | ICD-10-CM

## 2022-11-07 DIAGNOSIS — F411 Generalized anxiety disorder: Secondary | ICD-10-CM

## 2022-11-07 DIAGNOSIS — G479 Sleep disorder, unspecified: Secondary | ICD-10-CM | POA: Diagnosis not present

## 2022-11-07 DIAGNOSIS — F0390 Unspecified dementia without behavioral disturbance: Secondary | ICD-10-CM | POA: Diagnosis not present

## 2022-11-07 DIAGNOSIS — T50905A Adverse effect of unspecified drugs, medicaments and biological substances, initial encounter: Secondary | ICD-10-CM

## 2022-11-07 DIAGNOSIS — F333 Major depressive disorder, recurrent, severe with psychotic symptoms: Secondary | ICD-10-CM

## 2022-11-07 MED ORDER — BENZTROPINE MESYLATE 0.5 MG PO TABS: 0.5000 mg | ORAL_TABLET | Freq: Two times a day (BID) | ORAL | 1 refills | Status: DC

## 2022-11-07 MED ORDER — ARIPIPRAZOLE 5 MG PO TABS: 5.0000 mg | ORAL_TABLET | Freq: Every day | ORAL | 1 refills | Status: DC

## 2022-11-07 MED ORDER — QUETIAPINE FUMARATE 50 MG PO TABS: ORAL_TABLET | ORAL | 0 refills | Status: DC

## 2022-11-07 MED ORDER — QUETIAPINE FUMARATE 300 MG PO TABS: 300.0000 mg | ORAL_TABLET | Freq: Every day | ORAL | 1 refills | Status: DC

## 2022-11-07 MED ORDER — HALOPERIDOL 0.5 MG PO TABS: 0.5000 mg | ORAL_TABLET | Freq: Two times a day (BID) | ORAL | 0 refills | Status: DC

## 2022-11-07 NOTE — Progress Notes (Signed)
BH MD/PA/NP OP Progress Note  11/07/2022 3:58 PM Jody [name]  MRN:  [id number]  Chief Complaint:  Chief Complaint  Patient presents with   Follow-up   Medication Management   HPI:   Jody [name] is a 52 year old, African-American female with a past psychiatric history significant for schizoaffective disorder (depressed type), major depressive disorder, generalized anxiety disorder, sleep disturbances, and major neurocognitive disorder who presents to [facility], accompanied by [name], [phone]), for follow-up and medication management.  Patient is currently being managed on the following psychiatric medications:  Ingrezza 80 mg daily Fluoxetine 40 mg daily Clonidine 0.1 mg at bedtime Mirtazapine 15 mg at bedtime Melatonin 10 mg at bedtime Seroquel 200 mg daily Haldol 2.5 mg for 2 weeks before discontinuing  Per patient's sister, patient's tardive dyskinesia symptoms has worsened since her last encounter.  She also reports that the patient's hallucinations have gotten worse and is now negatively impacted by objects associated with the colors black, brown, and blue.  She reports that whenever the patient sees these objects with the specific colors, the patient fears her life is coming to an end.  She also reports that the patient is experiencing voices telling her that she is going to die due to "shaking to death."  She reports that the patient's shaking appears to worsen at night and is very noticeable.  Patient continues to have issues with sleep.  Per patient's sister, the patient slept from 1:00 AM and woke up at around 6 AM.  She reports that the patient was seen at the [facility] because of her ongoing issues.  She reports that the patient was instructed to decrease the dosage of her Ingrezza from 80 mg to 40 mg daily.  She reports that hydroxyzine was also given to the patient  for the management of her anxiety; however, patient's sister reports that the patient experiences some grogginess when taking the medication.  She does report that the patient was able to fall asleep within an hour after taking her hydroxyzine.  Patient was also instructed to further decrease her dosage of Haldol.  A PHQ-9 screen was performed with the patient scoring a 24.  A GAD-7 screen was also performed with the patient scoring a 22.  Patient is alert and oriented x 4, calm, cooperative, and engaged in conversation during the encounter.  Patient endorses fair mood.  During the encounter, patient exhibits noticeable rhythmic tremor observed in her hands, arms, mouth, and legs.  Patient denies suicidal or homicidal ideations.  She endorses auditory hallucinations every day.  Patient does not appear to be responding to internal/external stimuli.  Patient endorses fair sleep and receives on average less than 5 hours of sleep each night.  Patient endorses good appetite and eats on average 2-3 meals per day.  Patient denies alcohol consumption, tobacco use, and illicit drug use.  Visit Diagnosis:    ICD-10-CM   1. 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 Schizoaffective disorder, depressive type (HCC)  F25.1 QUEtiapine (SEROQUEL) 300 MG tablet    DISCONTINUED: QUEtiapine (SEROQUEL) 50 MG tablet    DISCONTINUED: ARIPiprazole (ABILIFY) 5 MG tablet    3. Major neurocognitive disorder (HCC)  F03.90 haloperidol (HALDOL) 0.5 MG tablet    4. Sleep disturbances  G47.9     5. GAD (generalized anxiety disorder)  F41.1     6. MDD (  major depressive disorder), recurrent, severe, with psychosis ([place])  F33.3       Past Psychiatric History:  Schizophrenia - diagnosed roughly 7 years ago when patient was attending [facility] Patient was diagnosed with schizoaffective disorder (depressive type) when admitted to [facility] from 04/08/2022 - 04/12/2022.  In addition to being diagnosed with schizoaffective disorder, patient was treated for insomnia, generalized anxiety disorder, and tardive dyskinesia.   **Onset of dementia, major neurocognitive disorder, due to Alzheimer's disease, without behavioral disturbance, mild  Past Medical History:  Past Medical History:  Diagnosis Date   Anxiety and depression    Bipolar disorder ([place])    Cystitis, interstitial    Elevated troponin    a. 09/2015: CP/elevated troponin up to 0.11 - unclear significance. CT neg for PE, LHC neg for CAD, normal echo.   Hypertension    Microcytic anemia    Noted on labs   Panic attack    Paranoid schizophrenia ([place])    Tobacco abuse     Past Surgical History:  Procedure Laterality Date   CARDIAC CATHETERIZATION N/A 09/29/2015   Procedure: Left Heart Cath and Coronary Angiography;  Surgeon: Jettie Booze, MD;  Location: [facility];  Service: Cardiovascular;  Laterality: N/A;   TUBAL LIGATION      Family Psychiatric History:  Grandmother (maternal) - unsure of the diagnosis but states that her grandmother had mental health issues Uncle (maternal) - Schizophrenia and bipolar disorder, he is currently taking medications but is unsure of the type.   Family history of suicide: Patient denies Family history of homicide: Patient denies Family history of substance abuse: Per patient's sister, alcohol abuse runs in the family  Family History:  Family History  Problem Relation Age of Onset   Diabetes Father    Hypertension Mother    Hypertension Sister    Lupus Sister    Heart disease Maternal Grandfather    Lupus Other     Social History:  Social History   Socioeconomic History   Marital status: Single    Spouse name: Not on file   Number of children: 5   Years of education: 12   Highest education level: Not on file  Occupational History   Occupation: Disabled  Tobacco Use   Smoking status: Former     Packs/day: 0.25    Years: 4.00    Total pack years: 1.00    Types: Cigarettes    Quit date: 05/01/2016    Years since quitting: 6.5   Smokeless tobacco: Never  Vaping Use   Vaping Use: Never used  Substance and Sexual Activity   Alcohol use: Not Currently    Alcohol/week: 0.0 standard drinks of alcohol    Comment: Quit 2017   Drug use: No   Sexual activity: Not Currently    Birth control/protection: Condom  Other Topics Concern   Not on file  Social History Narrative   Lives alone   Caffeine use: Soda- daily   Right-handed   Social Determinants of Health   Financial Resource Strain: Not on file  Food Insecurity: Not on file  Transportation Needs: Not on file  Physical Activity: Not on file  Stress: Not on file  Social Connections: Not on file    Allergies:  Allergies  Allergen Reactions   Penicillins Itching and Rash    Metabolic Disorder Labs: Lab Results  Component Value Date   HGBA1C 5.9 (H) 10/10/2022   MPG 114.02 04/07/2022   MPG 117  09/28/2015   Lab Results  Component Value Date   PROLACTIN 17.2 04/07/2022   PROLACTIN 155.0 (H) 12/28/2021   Lab Results  Component Value Date   CHOL 269 (H) 10/10/2022   TRIG 186 (H) 10/10/2022   HDL 43 10/10/2022   CHOLHDL 6.3 (H) 10/10/2022   VLDL 56 (H) 04/09/2022   LDLCALC 191 (H) 10/10/2022   LDLCALC 147 (H) 04/09/2022   Lab Results  Component Value Date   TSH 5.855 (H) 04/07/2022   TSH 1.560 06/28/2016    Therapeutic Level Labs: No results found for: "LITHIUM" Lab Results  Component Value Date   VALPROATE 118 (H) 04/26/2016   VALPROATE 136 (H) 04/26/2016   No results found for: "CBMZ"  Current Medications: Current Outpatient Medications  Medication Sig Dispense Refill   benztropine (COGENTIN) 0.5 MG tablet Take 1 tablet (0.5 mg total) by mouth 2 (two) times daily. 60 tablet 1   atorvastatin (LIPITOR) 10 MG tablet Take 1 tablet (10 mg total) by mouth daily. 90 tablet 3   cloNIDine (CATAPRES)  0.1 MG tablet Take 1 tablet (0.1 mg total) by mouth at bedtime. 90 tablet 1   donepezil (ARICEPT) 5 MG tablet Take 1 tablet (5 mg total) by mouth at bedtime. 90 tablet 1   ferrous sulfate (FEROSUL) 325 (65 FE) MG tablet Take 1 tablet (325 mg total) by mouth daily with breakfast. 90 tablet 3   FLUoxetine (PROZAC) 40 MG capsule Take 1 capsule (40 mg total) by mouth daily. 90 capsule 1   haloperidol (HALDOL) 0.5 MG tablet Take 1 tablet (0.5 mg total) by mouth 2 (two) times daily. 7 tablet 0   hydrochlorothiazide (HYDRODIURIL) 25 MG tablet Take 1 tablet (25 mg total) by mouth daily. 90 tablet 1   hydrOXYzine (ATARAX) 50 MG tablet Take 1 tablet (50 mg total) by mouth every 8 (eight) hours as needed for anxiety. 42 tablet 0   hydrOXYzine (ATARAX) 50 MG tablet Take 1 tablet (50 mg total) by mouth every 8 (eight) hours as needed for anxiety. 42 tablet 0   Melatonin 10 MG TABS Take 10 mg by mouth at bedtime. 90 tablet 1   mirtazapine (REMERON) 15 MG tablet Take 1 tablet (15 mg total) by mouth at bedtime. 90 tablet 1   QUEtiapine (SEROQUEL) 300 MG tablet Take 1 tablet (300 mg total) by mouth at bedtime. 30 tablet 1   valbenazine (INGREZZA) 80 MG capsule Take 1 capsule (80 mg total) by mouth daily. 30 capsule 1   Vitamin D, Ergocalciferol, (DRISDOL) 1.25 MG (50000 UNIT) CAPS capsule TAKE 1 CAPSULE BY MOUTH ONCE A WEEK 4 capsule 1   No current facility-administered medications for this visit.     Musculoskeletal: Strength & Muscle Tone: within normal limits Gait & Station: normal Patient leans: N/A  Psychiatric Specialty Exam: Review of Systems  Neurological:  Positive for tremors.  Psychiatric/Behavioral:  Positive for decreased concentration, dysphoric mood, hallucinations and sleep disturbance. Negative for self-injury and suicidal ideas. The patient is nervous/anxious. The patient is not hyperactive.     There were no vitals taken for this visit.There is no height or weight on file to calculate  BMI.  General Appearance: Casual  Eye Contact:  Good  Speech:  Clear and Coherent and Normal Rate  Volume:  Normal  Mood:  Anxious and Depressed  Affect:  Appropriate  Thought Process:  Coherent, Goal Directed, and Descriptions of Associations: Intact  Orientation:  Full (Time, Place, and Person)  Thought Content: WDL and  Hallucinations: Auditory   Suicidal Thoughts:  No  Homicidal Thoughts:  No  Memory:  Immediate;   Good Recent;   Fair Remote;   Fair  Judgement:  Good  Insight:  Good  Psychomotor Activity:  Restlessness and Tremor  Concentration:  Concentration: Good and Attention Span: Good  Recall:  Good  Fund of Knowledge: Fair  Language: Good  Akathisia:  No  Handed:  Right  AIMS (if indicated): done  Assets:  Communication Skills Desire for Improvement Housing Social Support  ADL's:  Intact  Cognition: Impaired,  Mild  Sleep:  Poor   Screenings: AIMS    Flowsheet Row Clinical Support from 11/07/2022 in [facility] Office Visit from 10/27/2022 in [facility] Admission (Discharged) from 04/08/2022 in [place] [facility] Admission (Discharged) from 01/29/2015 in [place] [facility]  AIMS Total Score 24 17 4  0      AUDIT    Flowsheet Row Admission (Discharged) from 01/29/2015 in [place] [facility] Admission (Discharged) from 09/12/2014 in [place] [facility] Admission (Discharged) from 07/03/2014 in [place] [facility]  Alcohol Use Disorder Identification Test Final Score (AUDIT) 9 4 20       GAD-7    Flowsheet Row Clinical Support from 11/07/2022 in [facility] Office Visit from 10/27/2022 in [facility] Office Visit from 10/10/2022 in [place] Office Visit from 01/30/2022 in [place] Office Visit from 12/28/2021 in [place]  Total GAD-7 Score 21 21 17 21 13       [place] Office Visit from 01/31/2017 in [facility] Office Visit from 11/27/2016 in [facility] Office Visit from 06/28/2016 in [facility] Office Visit from 05/25/2016 in [place] Office Visit from 03/11/2015 in [facility]  Total Score (max 30 points ) 25 11 18 15 26       PHQ2-9    Flowsheet Row Clinical Support from 11/07/2022 in [facility] Office Visit from 10/27/2022 in [facility] Office Visit from 10/10/2022 in [place] Office Visit from 01/30/2022 in [place] Office Visit from 12/28/2021 in [place]  PHQ-2 Total Score 6 6 2 3 3   PHQ-9 Total Score 22 26 11 12 12       Flowsheet Row Clinical Support from 11/07/2022 in [facility] ED from 11/05/2022 in [facility] ED from 10/31/2022 in [place] No Risk No Risk        Assessment and Plan:   Jody [name] is a 52 year old, African-American female with a past psychiatric history significant for schizoaffective disorder (depressed type), major depressive disorder, generalized anxiety disorder, sleep disturbances, and major neurocognitive disorder who presents to [facility], accompanied by her sister Adonis Brook, [phone]), for follow-up and medication management.  Patient's case was discussed with supervising physician Hampton Abbot, MD).  Provider discussed patient's tremor with supervising physician.   After discussing patient's symptoms with the supervising physician, provider determined that patient tremors were more closely associated with  extrapyramidal symptoms. Provider instructed patient and her sister to start patient on Cogentin 0.5 mg 2 times daily for the management of her extrapyramidal symptoms.  Patient was also instructed to discontinue Ingrezza once she is started Cogentin.  Provider also informed patient to take Haldol 0.5 mg for 7 days before discontinuing the medication.  For the management of patient's psychotic symptoms (auditory hallucinations), provider recommended patient increase her dosage of Seroquel from 200 mg to 300 mg at bedtime for the management of her psychosis and for mood stability.  Patient's sister vocalized understanding and informed provider that she would help administer the medication to patient correctly.  Patient to continue taking all other medications as prescribed.  Patient's medications to be e-prescribed to pharmacy of choice.  Collaboration of Care: Collaboration of Care: Medication Management AEB provider managing patient's psychiatric medications, Primary Care Provider AEB patient being seen by her primary care provider at [facility], Psychiatrist AEB patient being followed by mental health provider at this facility, and Referral or follow-up with counselor/therapist AEB patient being seen by a licensed clinical social worker at this facility  Patient/Guardian was advised Release of Information must be obtained prior to any record release in order to collaborate their care with an outside provider. Patient/Guardian was advised if they have not already done so to contact the registration department to sign all necessary forms in order for [place] to release information regarding their care.   Consent: Patient/Guardian gives verbal consent for treatment and assignment of benefits for services provided during this visit. Patient/Guardian  expressed understanding and agreed to proceed.   1. Schizoaffective disorder, depressive type (HCC)  - QUEtiapine (SEROQUEL) 300 MG tablet; Take 1 tablet (300 mg total) by mouth at bedtime.  Dispense: 30 tablet; Refill: 1  2. Major neurocognitive disorder (HCC)  - haloperidol (HALDOL) 0.5 MG tablet; Take 1 tablet (0.5 mg total) by mouth 2 (two) times daily.  Dispense: 7 tablet; Refill: 0  3. Drug-induced extrapyramidal movement disorder  - benztropine (COGENTIN) 0.5 MG tablet; Take 1 tablet (0.5 mg total) by mouth 2 (two) times daily.  Dispense: 60 tablet; Refill: 1  4. Sleep disturbances Patient to continue taking melatonin 10 mg at bedtime for the management of her sleep disturbances  5. GAD (generalized anxiety disorder) Patient to continue taking Prozac 40 mg daily for the management of her generalized anxiety disorder Provider to continue taking mirtazapine 15 mg at bedtime for the management of her generalized anxiety disorder  6. MDD (major depressive disorder), recurrent, severe, with psychosis (HCC) Patient to continue taking Prozac 40 mg daily for the management of her major depressive disorder Patient to continue taking mirtazapine 15 mg at bedtime for the management of her major depressive disorder  Patient to follow-up in 2 weeks Provider spent a total of 18 minutes with the patient/reviewing patient's chart  Meta Hatchet, PA 11/07/2022, 3:58 PM
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
## 2022-11-08 ENCOUNTER — Telehealth (HOSPITAL_COMMUNITY): Payer: Self-pay | Admitting: *Deleted

## 2022-11-08 NOTE — Telephone Encounter (Signed)
Provider was contacted by Cherylann Ratel, RN regarding clarification on medication.  Patient's medication was to be written as Haldol 0.5 mg daily for 7 days (7-day supply/7 tablets total).

## 2022-11-08 NOTE — Telephone Encounter (Signed)
Pharmacy called to clarify Haldol 0.5mg . States that the script was for a 3.5 day supply and wanted to make sure the medication was not suppose to be for a 7 day supply.

## 2022-11-15 ENCOUNTER — Telehealth (HOSPITAL_COMMUNITY): Payer: Self-pay | Admitting: *Deleted

## 2022-11-15 NOTE — Telephone Encounter (Signed)
Patients sister called to say that her sister's tardive dyskinesia has returned. She is starting to have mouth movements and her legs are shaking. Says that she is getting tired from moving all the time. Sister [name] is asking if they can restart Ingrezza or what she should do. Message sent to MD for review.

## 2022-11-17 ENCOUNTER — Other Ambulatory Visit (HOSPITAL_COMMUNITY): Payer: Self-pay | Admitting: Physician Assistant

## 2022-11-17 DIAGNOSIS — T50905A Adverse effect of unspecified drugs, medicaments and biological substances, initial encounter: Secondary | ICD-10-CM

## 2022-11-17 MED ORDER — BENZTROPINE MESYLATE 1 MG PO TABS: 1.0000 mg | ORAL_TABLET | Freq: Two times a day (BID) | ORAL | 1 refills | Status: DC

## 2022-11-17 NOTE — Telephone Encounter (Signed)
Provider was contacted by Cherylann Ratel, RN regarding message left by patient's sister informing this facility about return of patient's tremors.  Provider was able to reach out to patient's sister to discuss issues with patient's tremors.  Per patient's sister, patient's tremor started 2 days ago and they have been occurring in the patient's mouth and legs.  She reports that the tremor stopped for about a week before they returned.  Patient has been taking Cogentin 0.5 mg 2 times daily.  Provider recommended patient increase her Cogentin to 1 mg 2 times daily for the management of her tremors.  Patient's sister was agreeable to recommendation.  Patient's medication to be e-prescribed to pharmacy of choice.

## 2022-11-17 NOTE — Progress Notes (Signed)
Provider was contacted by Cherylann Ratel, RN regarding message left by patient's sister informing this facility about return of patient's tremors.  Provider was able to reach out to patient's sister to discuss issues with patient's tremors.  Per patient's sister, patient's tremor started 2 days ago and they have been occurring in the patient's mouth and legs.  She reports that the tremor stopped for about a week before they returned.  Patient has been taking Cogentin 0.5 mg 2 times daily.  Provider recommended patient increase her Cogentin to 1 mg 2 times daily for the management of her tremors.  Patient's sister was agreeable to recommendation.  Patient's medication to be e-prescribed to pharmacy of choice.

## 2022-11-21 ENCOUNTER — Encounter (HOSPITAL_COMMUNITY): Payer: Self-pay | Admitting: Physician Assistant

## 2022-11-21 ENCOUNTER — Ambulatory Visit (INDEPENDENT_AMBULATORY_CARE_PROVIDER_SITE_OTHER): Payer: Medicare Other | Admitting: Physician Assistant

## 2022-11-21 DIAGNOSIS — F251 Schizoaffective disorder, depressive type: Secondary | ICD-10-CM

## 2022-11-21 DIAGNOSIS — G2401 Drug induced subacute dyskinesia: Secondary | ICD-10-CM | POA: Diagnosis not present

## 2022-11-21 DIAGNOSIS — G2589 Other specified extrapyramidal and movement disorders: Secondary | ICD-10-CM | POA: Insufficient documentation

## 2022-11-21 DIAGNOSIS — F0390 Unspecified dementia without behavioral disturbance: Secondary | ICD-10-CM

## 2022-11-21 DIAGNOSIS — F333 Major depressive disorder, recurrent, severe with psychotic symptoms: Secondary | ICD-10-CM

## 2022-11-21 MED ORDER — AUSTEDO XR 24 MG PO TB24: 24.0000 mg | ORAL_TABLET | Freq: Every day | ORAL | 0 refills | Status: DC

## 2022-11-21 MED ORDER — AUSTEDO PATIENT TITRATION KIT 6 & 9 & 12 MG PO TBPK: 6.0000 mg | ORAL_TABLET | Freq: Two times a day (BID) | ORAL | 0 refills | Status: DC

## 2022-11-21 NOTE — Progress Notes (Signed)
BH MD/PA/NP OP Progress Note  11/21/2022 9:36 PM Jody [name]  MRN:  [id number]  Chief Complaint:  Chief Complaint  Patient presents with   Follow-up    Ongoing tremors   Medication Management   HPI:   Jody [name] is a 52 year old, African-American female with a past psychiatric history significant for schizoaffective disorder (depressed type), major depressive disorder, generalized anxiety disorder, sleep disturbances, and major neurocognitive disorder who presents to [facility], accompanied by her sister Jody [name], [phone]), for concerns over patient's tremors.  Patient is currently being managed on the following psychiatric medications:  Cogentin 1 mg 2 times daily Melatonin 10 mg at bedtime Seroquel 300 mg at bedtime Prozac 40 mg daily Mirtazapine 15 mg at bedtime  Per patient's sister, patient has been experiencing tremors even through the use of Cogentin 1 mg 2 times daily.  She reports that the patient occasionally experiences tremors in her arms but her tremors occur mostly in her mouth and left leg.  Patient reports that her tongue experiences tremors and states that her tongue has been rubbing the right inside of her mouth roll.  Despite patient's tremors, patient states that her mood has been good and denies depression.  She reports that her anxiety has also been fine.  She reports that her paranoia is stil.  During the assessment, tremors were noted on exam.  Provider notes observable mouth movements as well as spasms of the tongue.  Jerking left leg movements were also noted during the assessment.  Patient's presentation strongly suggests the presence of tardive dyskinesia.  Provider recommended managing patient's TD with [place].  Patient and her sister were agreeable to recommendation.  A PHQ-9 screen was performed with the patient scoring at 17.  A GAD-7 screen was also performed with the patient scoring a  21.  Patient is alert and oriented x 4, calm, cooperative, and engaged in conversation.  Patient endorses fair mood.  Patient denies suicidal or homicidal ideations.  She further denies active auditory or visual hallucinations and does not appear to be responding to internal/external stimuli.  She does report that she thought her boyfriend was going to kill her.  She also expressed that she experienced voices telling her that she was going to die if she went into the bathtub.  Patient endorses poor sleep and receives on average 2 to 3 hours of sleep each night.  Patient endorses good appetite and eats on average 3 meals per day.  Patient denies alcohol consumption, tobacco use, and illicit drug use.  Visit Diagnosis:    ICD-10-CM   1. Schizoaffective disorder, depressive type ([place])  F25.1 Deutetrabenazine (AUSTEDO PATIENT TITRATION KIT) 6 & 9 & 12 MG TBPK    Deutetrabenazine ER (AUSTEDO XR) 24 MG TB24    Prolactin    CANCELED: Prolactin    CANCELED: Prolactin    2. Tardive dyskinesia  G24.01 Deutetrabenazine (AUSTEDO PATIENT TITRATION KIT) 6 & 9 & 12 MG TBPK    Deutetrabenazine ER (AUSTEDO XR) 24 MG TB24    Prolactin    CANCELED: Prolactin    CANCELED: Prolactin      Past Psychiatric History:  Schizophrenia - diagnosed roughly 7 years ago when patient was attending [facility] Patient was diagnosed with schizoaffective disorder (depressive type) when admitted to [facility] from 04/08/2022 - 04/12/2022.  In addition to being diagnosed with schizoaffective disorder, patient was treated for insomnia, generalized anxiety disorder, and tardive dyskinesia.   **Onset  of dementia, major neurocognitive disorder, due to Alzheimer's disease, without behavioral disturbance, mild  Past Medical History:  Past Medical History:  Diagnosis Date   Anxiety and depression    Bipolar disorder ([place])    Cystitis, interstitial    Elevated troponin    a. 09/2015: CP/elevated  troponin up to 0.11 - unclear significance. CT neg for PE, LHC neg for CAD, normal echo.   Hypertension    Microcytic anemia    Noted on labs   Panic attack    Paranoid schizophrenia ([place])    Tobacco abuse     Past Surgical History:  Procedure Laterality Date   CARDIAC CATHETERIZATION N/A 09/29/2015   Procedure: Left Heart Cath and Coronary Angiography;  Surgeon: Jettie Booze, MD;  Location: [place] [facility];  Service: Cardiovascular;  Laterality: N/A;   TUBAL LIGATION      Family Psychiatric History:  Grandmother (maternal) - unsure of the diagnosis but states that her grandmother had mental health issues Uncle (maternal) - Schizophrenia and bipolar disorder, he is currently taking medications but is unsure of the type.   Family history of suicide: Patient denies Family history of homicide: Patient denies Family history of substance abuse: Per patient's sister, alcohol abuse runs in the family  Family History:  Family History  Problem Relation Age of Onset   Diabetes Father    Hypertension Mother    Hypertension Sister    Lupus Sister    Heart disease Maternal Grandfather    Lupus Other     Social History:  Social History   Socioeconomic History   Marital status: Single    Spouse name: Not on file   Number of children: 5   Years of education: 12   Highest education level: Not on file  Occupational History   Occupation: Disabled  Tobacco Use   Smoking status: Former    Packs/day: 0.25    Years: 4.00    Total pack years: 1.00    Types: Cigarettes    Quit date: 05/01/2016    Years since quitting: 6.5   Smokeless tobacco: Never  Vaping Use   Vaping Use: Never used  Substance and Sexual Activity   Alcohol use: Not Currently    Alcohol/week: 0.0 standard drinks of alcohol    Comment: Quit 2017   Drug use: No   Sexual activity: Not Currently    Birth control/protection: Condom  Other Topics Concern   Not on file  Social History Narrative   Lives  alone   Caffeine use: Soda- daily   Right-handed   Social Determinants of Health   Financial Resource Strain: Not on file  Food Insecurity: Not on file  Transportation Needs: Not on file  Physical Activity: Not on file  Stress: Not on file  Social Connections: Not on file    Allergies:  Allergies  Allergen Reactions   Penicillins Itching and Rash    Metabolic Disorder Labs: Lab Results  Component Value Date   HGBA1C 5.9 (H) 10/10/2022   MPG 114.02 04/07/2022   MPG 117 09/28/2015   Lab Results  Component Value Date   PROLACTIN 17.2 04/07/2022   PROLACTIN 155.0 (H) 12/28/2021   Lab Results  Component Value Date   CHOL 269 (H) 10/10/2022   TRIG 186 (H) 10/10/2022   HDL 43 10/10/2022   CHOLHDL 6.3 (H) 10/10/2022   VLDL 56 (H) 04/09/2022   LDLCALC 191 (H) 10/10/2022   LDLCALC 147 (H) 04/09/2022   Lab Results  Component  Value Date   TSH 5.855 (H) 04/07/2022   TSH 1.560 06/28/2016    Therapeutic Level Labs: No results found for: "LITHIUM" Lab Results  Component Value Date   VALPROATE 118 (H) 04/26/2016   VALPROATE 136 (H) 04/26/2016   No results found for: "CBMZ"  Current Medications: Current Outpatient Medications  Medication Sig Dispense Refill   Deutetrabenazine (AUSTEDO PATIENT TITRATION KIT) 6 & 9 & 12 MG TBPK Take 6-30 mg by mouth 2 (two) times daily. 1 each 0   Deutetrabenazine ER (AUSTEDO XR) 24 MG TB24 Take 24 mg by mouth daily. 30 tablet 0   atorvastatin (LIPITOR) 10 MG tablet Take 1 tablet (10 mg total) by mouth daily. 90 tablet 3   benztropine (COGENTIN) 1 MG tablet Take 1 tablet (1 mg total) by mouth 2 (two) times daily. 60 tablet 1   cloNIDine (CATAPRES) 0.1 MG tablet Take 1 tablet (0.1 mg total) by mouth at bedtime. 90 tablet 1   donepezil (ARICEPT) 5 MG tablet Take 1 tablet (5 mg total) by mouth at bedtime. 90 tablet 1   ferrous sulfate (FEROSUL) 325 (65 FE) MG tablet Take 1 tablet (325 mg total) by mouth daily with breakfast. 90 tablet 3    FLUoxetine (PROZAC) 40 MG capsule Take 1 capsule (40 mg total) by mouth daily. 90 capsule 1   haloperidol (HALDOL) 0.5 MG tablet Take 1 tablet (0.5 mg total) by mouth 2 (two) times daily. 7 tablet 0   hydrochlorothiazide (HYDRODIURIL) 25 MG tablet Take 1 tablet (25 mg total) by mouth daily. 90 tablet 1   hydrOXYzine (ATARAX) 50 MG tablet Take 1 tablet (50 mg total) by mouth every 8 (eight) hours as needed for anxiety. 42 tablet 0   hydrOXYzine (ATARAX) 50 MG tablet Take 1 tablet (50 mg total) by mouth every 8 (eight) hours as needed for anxiety. 42 tablet 0   Melatonin 10 MG TABS Take 10 mg by mouth at bedtime. 90 tablet 1   mirtazapine (REMERON) 15 MG tablet Take 1 tablet (15 mg total) by mouth at bedtime. 90 tablet 1   QUEtiapine (SEROQUEL) 300 MG tablet Take 1 tablet (300 mg total) by mouth at bedtime. 30 tablet 1   Vitamin D, Ergocalciferol, (DRISDOL) 1.25 MG (50000 UNIT) CAPS capsule TAKE 1 CAPSULE BY MOUTH ONCE A WEEK 4 capsule 1   No current facility-administered medications for this visit.     Musculoskeletal: Strength & Muscle Tone: within normal limits Gait & Station: normal Patient leans: N/A  Psychiatric Specialty Exam: Review of Systems  Neurological:  Positive for tremors.  Psychiatric/Behavioral:  Positive for sleep disturbance. Negative for decreased concentration, dysphoric mood, hallucinations, self-injury and suicidal ideas. The patient is not nervous/anxious and is not hyperactive.     There were no vitals taken for this visit.There is no height or weight on file to calculate BMI.  General Appearance: Casual  Eye Contact:  Good  Speech:  Clear and Coherent and Normal Rate  Volume:  Normal  Mood:  Depressed  Affect:  Appropriate  Thought Process:  Coherent, Goal Directed, and Descriptions of Associations: Intact  Orientation:  Full (Time, Place, and Person)  Thought Content: WDL   Suicidal Thoughts:  No  Homicidal Thoughts:  No  Memory:  Immediate;    Good Recent;   Fair Remote;   Fair  Judgement:  Good  Insight:  Good  Psychomotor Activity:  Restlessness and TD  Concentration:  Concentration: Good and Attention Span: Good  Recall:  Good  Fund of Knowledge: Fair  Language: Good  Akathisia:  No  Handed:  Right  AIMS (if indicated): done  Assets:  Communication Skills Desire for Improvement Housing Social Support  ADL's:  Intact  Cognition: Impaired,  Mild  Sleep:  Poor   Screenings: AIMS    Flowsheet Row Clinical Support from 11/21/2022 in [place] from 11/07/2022 in [facility] Office Visit from 10/27/2022 in [facility] Admission (Discharged) from 04/08/2022 in [place] [facility] Admission (Discharged) from 01/29/2015 in [place] [facility]  AIMS Total Score '28 24 17 4 '$ 0      AUDIT    Flowsheet Row Admission (Discharged) from 01/29/2015 in [place] [facility] Admission (Discharged) from 09/12/2014 in [place] [facility] Admission (Discharged) from 07/03/2014 in [place] [facility]  Alcohol Use Disorder Identification Test Final Score (AUDIT) '9 4 20      '$ GAD-7    Flowsheet Row Clinical Support from 11/07/2022 in [facility] Office Visit from 10/27/2022 in [facility] Office Visit from 10/10/2022 in [place] Office Visit from 01/30/2022 in [place] Office Visit from 12/28/2021 in [place]  Total GAD-7 Score '21 21 17 21 13      '$ [place] Office Visit from 01/31/2017 in [facility] Office Visit from 11/27/2016 in [facility] Office Visit from 06/28/2016 in [facility] Office Visit from 05/25/2016 in [place] Office Visit from 03/11/2015 in [facility]  Total Score (max 30 points ) '25 11 18 15 26      '$ PHQ2-9    Flowsheet Row Clinical Support from 11/21/2022 in [place] from 11/07/2022 in [facility] Office Visit from 10/27/2022 in [facility] Office Visit from 10/10/2022 in [place] Office Visit from 01/30/2022 in [place]  PHQ-2 Total Score '4 6 6 2 3  '$ PHQ-9 Total Score '17 22 26 11 12      '$ Flowsheet Row Clinical Support from 11/21/2022 in [place] from 11/07/2022 in [facility] ED from 11/05/2022 in [place] Low Risk Low Risk No Risk        Assessment and Plan:   Aloni Orocio is a 52 year old, African-American female with a past psychiatric history significant for schizoaffective disorder (depressed type), major depressive disorder, generalized anxiety disorder, sleep disturbances, and major neurocognitive disorder who presents to [facility], accompanied by her sister Jody [name], [phone]), for concerns over patient's tremors.  During the assessment, provider notes observable mouth movements as well as spasms of the tongue and pursing of the lips.  Jerking left leg movements were also noted during the assessment.  Patient's presentation strongly suggests the presence of tardive dyskinesia.  Patient has utilized Ingrezza in the past for the management of her tremors with very little success.  Patient has not experienced no relief from her tremors through the use of Cogentin.  Provider recommended patient be placed on  Austedo titration kit  for the management of her tardive dyskinesia.  Patient was agreeable to recommendation.  Patient's medication to be prescribed to pharmacy of choice.  Provider to get a prolactin level on the patient.  Will review prolactin level once the results becomes available.  Collaboration of Care: Collaboration of Care: Medication Management AEB provider managing patient's psychiatric medications, Primary Care Provider AEB patient being seen by a primary care provider at [facility], Psychiatrist AEB patient being seen by mental health provider at this facility, and Referral or follow-up with counselor/therapist AEB patient being seen by a licensed clinical social worker at this facility  Patient/Guardian was advised Release of Information must be obtained prior to any record release in order to collaborate their care with an outside provider. Patient/Guardian was advised if they have not already done so to contact the registration department to sign all necessary forms in order for [place] to release information regarding their care.   Consent: Patient/Guardian gives verbal consent for treatment and assignment of benefits for services provided during this visit. Patient/Guardian expressed understanding and agreed to proceed.   1. Schizoaffective disorder, depressive type ([place]) Patient to continue taking Seroquel 300 mg at bedtime for the management of her schizoaffective disorder (depressive type)  - Deutetrabenazine (AUSTEDO PATIENT TITRATION KIT) 6 & 9 & 12 MG TBPK; Take 6-30 mg by mouth 2 (two) times daily.  Dispense: 1 each; Refill: 0 - Deutetrabenazine ER (AUSTEDO XR) 24 MG TB24; Take 24 mg by mouth daily.  Dispense: 30 tablet; Refill: 0 - Prolactin; Future  2. Tardive dyskinesia  - Deutetrabenazine (AUSTEDO PATIENT TITRATION KIT) 6 & 9 & 12 MG TBPK; Take 6-30 mg by mouth 2 (two) times daily.  Dispense: 1 each; Refill: 0 - Deutetrabenazine ER (AUSTEDO XR) 24 MG  TB24; Take 24 mg by mouth daily.  Dispense: 30 tablet; Refill: 0 - Prolactin; Future  3. MDD (major depressive disorder), recurrent, severe, with psychosis ([place]) Patient to continue taking Prozac 40 mg daily for the management of her depressive disorder Patient to continue taking mirtazapine 15 mg at bedtime for the management of her major depressive disorder  4. Major neurocognitive disorder [facility])  Patient has an appointment scheduled for 12/08/2022 Provider spent a total of 35 minutes with the patient/reviewing patient's chart  Malachy Mood, PA 11/21/2022, 9:36 PM

## 2022-11-28 ENCOUNTER — Other Ambulatory Visit (HOSPITAL_COMMUNITY): Payer: Self-pay | Admitting: Psychiatry

## 2022-11-28 DIAGNOSIS — F251 Schizoaffective disorder, depressive type: Secondary | ICD-10-CM

## 2022-11-29 ENCOUNTER — Ambulatory Visit: Payer: Medicare Other | Attending: Nurse Practitioner

## 2022-11-29 ENCOUNTER — Telehealth: Payer: Self-pay

## 2022-11-29 NOTE — Telephone Encounter (Signed)
This nurse called patient for AWV. She said that it was not a good time. Told her we will call her at a later time to reschedule.

## 2022-12-01 DIAGNOSIS — F251 Schizoaffective disorder, depressive type: Secondary | ICD-10-CM | POA: Diagnosis not present

## 2022-12-02 LAB — PROLACTIN: Prolactin: 9.7 ng/mL (ref 4.8–33.4)

## 2022-12-05 ENCOUNTER — Encounter: Payer: Self-pay | Admitting: Nurse Practitioner

## 2022-12-08 ENCOUNTER — Encounter (HOSPITAL_COMMUNITY): Payer: Self-pay | Admitting: Physician Assistant

## 2022-12-08 ENCOUNTER — Ambulatory Visit (INDEPENDENT_AMBULATORY_CARE_PROVIDER_SITE_OTHER): Payer: Medicare Other | Admitting: Physician Assistant

## 2022-12-08 DIAGNOSIS — F251 Schizoaffective disorder, depressive type: Secondary | ICD-10-CM | POA: Diagnosis not present

## 2022-12-08 DIAGNOSIS — G479 Sleep disorder, unspecified: Secondary | ICD-10-CM | POA: Diagnosis not present

## 2022-12-08 DIAGNOSIS — F0390 Unspecified dementia without behavioral disturbance: Secondary | ICD-10-CM

## 2022-12-08 DIAGNOSIS — F333 Major depressive disorder, recurrent, severe with psychotic symptoms: Secondary | ICD-10-CM

## 2022-12-08 MED ORDER — MELATONIN 10 MG PO TABS: 10.0000 mg | ORAL_TABLET | Freq: Every day | ORAL | 2 refills | Status: DC

## 2022-12-08 MED ORDER — MIRTAZAPINE 15 MG PO TABS: 15.0000 mg | ORAL_TABLET | Freq: Every day | ORAL | 2 refills | Status: DC

## 2022-12-08 MED ORDER — QUETIAPINE FUMARATE 300 MG PO TABS: 300.0000 mg | ORAL_TABLET | Freq: Every day | ORAL | 2 refills | Status: DC

## 2022-12-08 MED ORDER — FLUOXETINE HCL 40 MG PO CAPS: 40.0000 mg | ORAL_CAPSULE | Freq: Every day | ORAL | 2 refills | Status: DC

## 2022-12-08 NOTE — Progress Notes (Unsigned)
BH MD/PA/NP OP Progress Note  12/08/2022 6:20 PM Jody [name]  MRN:  [id number]  Chief Complaint:  Chief Complaint  Patient presents with   Follow-up   Medication Refill   HPI:   Jody [name] is a 52 year old, African-American female with a past psychiatric history significant for schizoaffective disorder (depressed type), major depressive disorder, generalized anxiety disorder, sleep disturbances, and major neurocognitive disorder who presents to [facility], accompanied by her sister Jody [name], [phone]), for follow up and medication management. Patient is currently being managed on the following psychiatric medications:  Melatonin 10 mg at bedtime Seroquel 300 mg at bedtime Prozac 40 mg daily Mirtazapine 15 mg at bedtime Austedo Patient Titration Kit 6 & 9 & 12 mg TBPK  Since the last encounter, patient reports that she has been happy.  Patient reports that she has been using Austedo regularly and states that her tremors have greatly decreased.  Patient reports that she still continues to experience some movement in her mouth and feet; however, the patient has been able to do activities without issue such as going to the gym.  Patient denies issues with her other medications and reports that her mood has been good.  Per patient's sister, patient has been more happy and perky.  Patient denies depression.  Patient continues to endorse some anxiety.  A PHQ-9 screen was performed with the patient scoring of 13.  A GAD-7 screen was also performed with the patient scoring of 15.  Patient is alert and oriented x 4, calm, cooperative, and fully engaged in conversation during the encounter.  Patient endorses good mood.  Patient denies suicidal or homicidal ideation.  She further denies auditory or visual hallucinations and does not appear to be responding to internal/external stimuli.  Patient endorses poor sleep and receives on average 2 to  3 hours of sleep each night.  Patient endorses good appetite and eats on average 3 meals per day.  Patient denies alcohol, tobacco use, and illicit drug use.  Visit Diagnosis:    ICD-10-CM   1. Sleep disturbances  G47.9 Melatonin 10 MG TABS    2. MDD (major depressive disorder), recurrent, severe, with psychosis (HCC)  F33.3 mirtazapine (REMERON) 15 MG tablet    FLUoxetine (PROZAC) 40 MG capsule    3. Schizoaffective disorder, depressive type (HCC)  F25.1 QUEtiapine (SEROQUEL) 300 MG tablet    4. Major neurocognitive disorder (HCC)  F03.90 FLUoxetine (PROZAC) 40 MG capsule      Past Psychiatric History:  Schizophrenia - diagnosed roughly 7 years ago when patient was attending [facility] Patient was diagnosed with schizoaffective disorder (depressive type) when admitted to [facility] from 04/08/2022 - 04/12/2022.  In addition to being diagnosed with schizoaffective disorder, patient was treated for insomnia, generalized anxiety disorder, and tardive dyskinesia.   **Onset of dementia, major neurocognitive disorder, due to Alzheimer's disease, without behavioral disturbance, mild  Past Medical History:  Past Medical History:  Diagnosis Date   Anxiety and depression    Bipolar disorder ([place])    Cystitis, interstitial    Elevated troponin    a. 09/2015: CP/elevated troponin up to 0.11 - unclear significance. CT neg for PE, LHC neg for CAD, normal echo.   Hypertension    Microcytic anemia    Noted on labs   Panic attack    Paranoid schizophrenia ([place])    Tobacco abuse     Past Surgical History:  Procedure Laterality Date   CARDIAC CATHETERIZATION  N/A 09/29/2015   Procedure: Left Heart Cath and Coronary Angiography;  Surgeon: Jettie Booze, MD;  Location: [facility];  Service: Cardiovascular;  Laterality: N/A;   TUBAL LIGATION      Family Psychiatric History:  Grandmother (maternal) - unsure of the diagnosis but states that her grandmother  had mental health issues Uncle (maternal) - Schizophrenia and bipolar disorder, he is currently taking medications but is unsure of the type.   Family history of suicide: Patient denies Family history of homicide: Patient denies Family history of substance abuse: Per patient's sister, alcohol abuse runs in the family  Family History:  Family History  Problem Relation Age of Onset   Diabetes Father    Hypertension Mother    Hypertension Sister    Lupus Sister    Heart disease Maternal Grandfather    Lupus Other     Social History:  Social History   Socioeconomic History   Marital status: Single    Spouse name: Not on file   Number of children: 5   Years of education: 12   Highest education level: Not on file  Occupational History   Occupation: Disabled  Tobacco Use   Smoking status: Former    Packs/day: 0.25    Years: 4.00    Total pack years: 1.00    Types: Cigarettes    Quit date: 05/01/2016    Years since quitting: 6.6   Smokeless tobacco: Never  Vaping Use   Vaping Use: Never used  Substance and Sexual Activity   Alcohol use: Not Currently    Alcohol/week: 0.0 standard drinks of alcohol    Comment: Quit 2017   Drug use: No   Sexual activity: Not Currently    Birth control/protection: Condom  Other Topics Concern   Not on file  Social History Narrative   Lives alone   Caffeine use: Soda- daily   Right-handed   Social Determinants of Health   Financial Resource Strain: Not on file  Food Insecurity: Not on file  Transportation Needs: Not on file  Physical Activity: Not on file  Stress: Not on file  Social Connections: Not on file    Allergies:  Allergies  Allergen Reactions   Penicillins Itching and Rash    Metabolic Disorder Labs: Lab Results  Component Value Date   HGBA1C 5.9 (H) 10/10/2022   MPG 114.02 04/07/2022   MPG 117 09/28/2015   Lab Results  Component Value Date   PROLACTIN 9.7 12/01/2022   PROLACTIN 17.2 04/07/2022   Lab  Results  Component Value Date   CHOL 269 (H) 10/10/2022   TRIG 186 (H) 10/10/2022   HDL 43 10/10/2022   CHOLHDL 6.3 (H) 10/10/2022   VLDL 56 (H) 04/09/2022   LDLCALC 191 (H) 10/10/2022   LDLCALC 147 (H) 04/09/2022   Lab Results  Component Value Date   TSH 5.855 (H) 04/07/2022   TSH 1.560 06/28/2016    Therapeutic Level Labs: No results found for: "LITHIUM" Lab Results  Component Value Date   VALPROATE 118 (H) 04/26/2016   VALPROATE 136 (H) 04/26/2016   No results found for: "CBMZ"  Current Medications: Current Outpatient Medications  Medication Sig Dispense Refill   atorvastatin (LIPITOR) 10 MG tablet Take 1 tablet (10 mg total) by mouth daily. 90 tablet 3   cloNIDine (CATAPRES) 0.1 MG tablet Take 1 tablet (0.1 mg total) by mouth at bedtime. 90 tablet 1   Deutetrabenazine (AUSTEDO PATIENT TITRATION KIT) 6 & 9 & 12 MG TBPK  Take 6-30 mg by mouth 2 (two) times daily. 1 each 0   Deutetrabenazine ER (AUSTEDO XR) 24 MG TB24 Take 24 mg by mouth daily. 30 tablet 0   donepezil (ARICEPT) 5 MG tablet Take 1 tablet (5 mg total) by mouth at bedtime. 90 tablet 1   ferrous sulfate (FEROSUL) 325 (65 FE) MG tablet Take 1 tablet (325 mg total) by mouth daily with breakfast. 90 tablet 3   FLUoxetine (PROZAC) 40 MG capsule Take 1 capsule (40 mg total) by mouth daily. 90 capsule 2   hydrochlorothiazide (HYDRODIURIL) 25 MG tablet Take 1 tablet (25 mg total) by mouth daily. 90 tablet 1   hydrOXYzine (ATARAX) 50 MG tablet Take 1 tablet (50 mg total) by mouth every 8 (eight) hours as needed for anxiety. 42 tablet 0   hydrOXYzine (ATARAX) 50 MG tablet Take 1 tablet (50 mg total) by mouth every 8 (eight) hours as needed for anxiety. 42 tablet 0   Melatonin 10 MG TABS Take 10 mg by mouth at bedtime. 90 tablet 2   mirtazapine (REMERON) 15 MG tablet Take 1 tablet (15 mg total) by mouth at bedtime. 90 tablet 2   QUEtiapine (SEROQUEL) 300 MG tablet Take 1 tablet (300 mg total) by mouth at bedtime. 30 tablet  2   Vitamin D, Ergocalciferol, (DRISDOL) 1.25 MG (50000 UNIT) CAPS capsule TAKE 1 CAPSULE BY MOUTH ONCE A WEEK 4 capsule 1   No current facility-administered medications for this visit.     Musculoskeletal: Strength & Muscle Tone: within normal limits Gait & Station: normal Patient leans: N/A  Psychiatric Specialty Exam: Review of Systems  Neurological:  Positive for tremors.  Psychiatric/Behavioral:  Positive for sleep disturbance. Negative for decreased concentration, dysphoric mood, hallucinations, self-injury and suicidal ideas. The patient is not nervous/anxious and is not hyperactive.     There were no vitals taken for this visit.There is no height or weight on file to calculate BMI.  General Appearance: Casual  Eye Contact:  Good  Speech:  Clear and Coherent and Normal Rate  Volume:  Normal  Mood:  Euthymic  Affect:  Appropriate  Thought Process:  Coherent, Goal Directed, and Descriptions of Associations: Intact  Orientation:  Full (Time, Place, and Person)  Thought Content: WDL   Suicidal Thoughts:  No  Homicidal Thoughts:  No  Memory:  Immediate;   Good Recent;   Fair Remote;   Fair  Judgement:  Good  Insight:  Good  Psychomotor Activity:  TD  Concentration:  Concentration: Good and Attention Span: Good  Recall:  Good  Fund of Knowledge: Fair  Language: Good  Akathisia:  No  Handed:  Right  AIMS (if indicated): done  Assets:  Communication Skills Desire for Improvement Housing Social Support  ADL's:  Intact  Cognition: Impaired,  Mild  Sleep:  Poor   Screenings: [place] Office Visit from 12/08/2022 in [place] from 11/21/2022 in [place] from 11/07/2022 in [facility] Office Visit from 10/27/2022 in [facility] Admission (Discharged) from 04/08/2022 in [place]  [facility]  AIMS Total Score '8 28 24 17 4      '$ AUDIT    Flowsheet Row Admission (Discharged) from 01/29/2015 in [place] [facility] Admission (Discharged) from 09/12/2014 in [place] [facility] Admission (Discharged) from 07/03/2014 in [place] [facility]  Alcohol Use Disorder Identification Test Final Score (AUDIT) '9 4 20      '$ GAD-7    Flowsheet Row Office Visit from 12/08/2022 in [facility] Clinical Support from 11/07/2022 in [facility] Office Visit from 10/27/2022 in [facility] Office Visit from 10/10/2022 in [place] Office Visit from 01/30/2022 in [place]  Total GAD-7 Score '15 21 21 17 21      '$ Mini-Mental    [place] Office Visit from 01/31/2017 in [facility] Office Visit from 11/27/2016 in [facility] Office Visit from 06/28/2016 in [facility] Office Visit from 05/25/2016 in [place] Office Visit from 03/11/2015 in [facility]  Total Score (max 30 points ) '25 11 18 15 26      '$ PHQ2-9    [place] Visit from 12/08/2022 in [place] from 11/21/2022 in [place] from 11/07/2022 in [facility] Office Visit from 10/27/2022 in [facility] Office Visit from 10/10/2022 in [place]  PHQ-2 Total Score '2 4 6 6 2  '$ PHQ-9 Total Score '13 17 22 26 11      '$ [place] Office Visit from 12/08/2022 in [place] from 11/21/2022 in [place]  from 11/07/2022 in [place] CATEGORY Low Risk Low Risk Low Risk        Assessment and Plan:   Nali Farella is a 52 year old, African-American female with a past psychiatric history significant for schizoaffective disorder (depressed type), major depressive disorder, generalized anxiety disorder, sleep disturbances, and major neurocognitive disorder who presents to [facility], accompanied by her sister Jody [name], [phone]), for follow up and medication management.  Patient presents to the encounter experiencing less tremor since taking Austedo.  Patient states that she still continues to experience some tremors in her mouth (as well as tongue) and foot.  Patient reports the use of her other medications have been going well and denies any issues or concerns with them.  Patient continues to endorse issues with her sleep stating that she only receives roughly 2 to 3 hours of sleep each night.  Provider to look into sleep aid options for the patient to utilize.  Patient is requesting refills on all of her medications.  Patient's medications to be e-prescribed to pharmacy of choice.  Collaboration of Care: Collaboration of Care: Medication Management AEB provider managing patient's psychiatric medications, Primary Care Provider AEB patient being seen by a primary care provider at [facility], Psychiatrist AEB patient being seen by mental health provider at this facility, and Referral or follow-up with counselor/therapist AEB patient being seen by a licensed clinical social worker at this facility  Patient/Guardian was advised Release of Information must be obtained prior to any record release in order to collaborate their care with an outside provider. Patient/Guardian was advised if they have not already done so to contact the registration department to sign all necessary forms in order  for [place] to release information regarding their care.   Consent: Patient/Guardian gives verbal consent for treatment and assignment of benefits for services provided during this  visit. Patient/Guardian expressed understanding and agreed to proceed.   1. Sleep disturbances  - Melatonin 10 MG TABS; Take 10 mg by mouth at bedtime.  Dispense: 90 tablet; Refill: 2  2. MDD (major depressive disorder), recurrent, severe, with psychosis ([place])  - mirtazapine (REMERON) 15 MG tablet; Take 1 tablet (15 mg total) by mouth at bedtime.  Dispense: 90 tablet; Refill: 2 - FLUoxetine (PROZAC) 40 MG capsule; Take 1 capsule (40 mg total) by mouth daily.  Dispense: 90 capsule; Refill: 2  3. Schizoaffective disorder, depressive type (HCC)  - QUEtiapine (SEROQUEL) 300 MG tablet; Take 1 tablet (300 mg total) by mouth at bedtime.  Dispense: 30 tablet; Refill: 2  4. Major neurocognitive disorder (HCC)  - FLUoxetine (PROZAC) 40 MG capsule; Take 1 capsule (40 mg total) by mouth daily.  Dispense: 90 capsule; Refill: 2   Patient to follow-up in 6 weeks Provider spent a total of 24 minutes with the patient/reviewing patient's chart  Malachy Mood, PA 12/08/2022, 6:20 PM

## 2022-12-12 ENCOUNTER — Ambulatory Visit (HOSPITAL_COMMUNITY): Payer: Medicare Other | Admitting: Mental Health

## 2022-12-12 NOTE — Progress Notes (Signed)
Pt presented onsite for therapy appointment. Intake assessment completed 11/07/22. Pt holds Medicare insurance; therapist is [facility] and unable to bill for this service. Pt rescheduled with LCSW. Therapist educated pt of this, apologized and supported in reschedule process. No safety concerns.

## 2022-12-13 ENCOUNTER — Telehealth: Payer: Self-pay | Admitting: Nurse Practitioner

## 2022-12-13 NOTE — Telephone Encounter (Signed)
Called patient to schedule Medicare Annual Wellness Visit (AWV). Left message for patient to call back and schedule Medicare Annual Wellness Visit (AWV).  Last date of AWV: 06/26/21  If any questions, please contact me at [phone].  Thank you ,  Barkley Boards AWV direct phone # [phone]

## 2022-12-15 ENCOUNTER — Telehealth: Payer: Self-pay | Admitting: Nurse Practitioner

## 2022-12-15 NOTE — Telephone Encounter (Signed)
Contacted Jody [name] to schedule their annual wellness visit. Appointment made for 12/22/22.  Jody [name] AWV direct phone # [phone]

## 2022-12-22 ENCOUNTER — Other Ambulatory Visit (HOSPITAL_COMMUNITY): Payer: Self-pay | Admitting: Physician Assistant

## 2022-12-22 ENCOUNTER — Ambulatory Visit: Payer: Medicare Other | Attending: Nurse Practitioner

## 2022-12-22 VITALS — Ht 64.0 in | Wt 180.0 lb

## 2022-12-22 DIAGNOSIS — G479 Sleep disorder, unspecified: Secondary | ICD-10-CM

## 2022-12-22 DIAGNOSIS — Z0000 Encounter for general adult medical examination without abnormal findings: Secondary | ICD-10-CM | POA: Diagnosis not present

## 2022-12-22 MED ORDER — RAMELTEON 8 MG PO TABS: 8.0000 mg | ORAL_TABLET | Freq: Every day | ORAL | 0 refills | Status: DC

## 2022-12-22 NOTE — Patient Instructions (Signed)
Jody [name] , Thank you for taking time to come for your Medicare Wellness Visit. I appreciate your ongoing commitment to your health goals. Please review the following plan we discussed and let me know if I can assist you in the future.   These are the goals we discussed:  Goals      Patient Stated     12/22/2022, wants to lose weight        This is a list of the screening recommended for you and due dates:  Health Maintenance  Topic Date Due   Colon Cancer Screening  Never done   Pap Smear  03/21/2020   COVID-19 Vaccine (4 - 2023-24 season) 06/02/2022   Flu Shot  12/31/2022*   Medicare Annual Wellness Visit  12/22/2023   DTaP/Tdap/Td vaccine (2 - Td or Tdap) 02/02/2027   Hepatitis C Screening: USPSTF Recommendation to screen - Ages 18-79 yo.  Completed   HIV Screening  Completed   HPV Vaccine  Aged Out  *Topic was postponed. The date shown is not the original due date.    Advanced directives: Advance directive discussed with you today.   Conditions/risks identified: none  Next appointment: Follow up in one year for your annual wellness visit.   Preventive Care 40-64 Years, Female Preventive care refers to lifestyle choices and visits with your health care provider that can promote health and wellness. What does preventive care include? A yearly physical exam. This is also called an annual well check. Dental exams once or twice a year. Routine eye exams. Ask your health care provider how often you should have your eyes checked. Personal lifestyle choices, including: Daily care of your teeth and gums. Regular physical activity. Eating a healthy diet. Avoiding tobacco and drug use. Limiting alcohol use. Practicing safe sex. Taking low-dose aspirin daily starting at age 80. Taking vitamin and mineral supplements as recommended by your health care provider. What happens during an annual well check? The services and screenings done by your health care provider during your  annual well check will depend on your age, overall health, lifestyle risk factors, and family history of disease. Counseling  Your health care provider may ask you questions about your: Alcohol use. Tobacco use. Drug use. Emotional well-being. Home and relationship well-being. Sexual activity. Eating habits. Work and work Statistician. Method of birth control. Menstrual cycle. Pregnancy history. Screening  You may have the following tests or measurements: Height, weight, and BMI. Blood pressure. Lipid and cholesterol levels. These may be checked every 5 years, or more frequently if you are over 33 years old. Skin check. Lung cancer screening. You may have this screening every year starting at age 4 if you have a 30-pack-year history of smoking and currently smoke or have quit within the past 15 years. Fecal occult blood test (FOBT) of the stool. You may have this test every year starting at age 57. Flexible sigmoidoscopy or colonoscopy. You may have a sigmoidoscopy every 5 years or a colonoscopy every 10 years starting at age 34. Hepatitis C blood test. Hepatitis B blood test. Sexually transmitted disease (STD) testing. Diabetes screening. This is done by checking your blood sugar (glucose) after you have not eaten for a while (fasting). You may have this done every 1-3 years. Mammogram. This may be done every 1-2 years. Talk to your health care provider about when you should start having regular mammograms. This may depend on whether you have a family history of breast cancer. BRCA-related cancer screening. This may  be done if you have a family history of breast, ovarian, tubal, or peritoneal cancers. Pelvic exam and Pap test. This may be done every 3 years starting at age 50. Starting at age 29, this may be done every 5 years if you have a Pap test in combination with an HPV test. Bone density scan. This is done to screen for osteoporosis. You may have this scan if you are at high  risk for osteoporosis. Discuss your test results, treatment options, and if necessary, the need for more tests with your health care provider. Vaccines  Your health care provider may recommend certain vaccines, such as: Influenza vaccine. This is recommended every year. Tetanus, diphtheria, and acellular pertussis (Tdap, Td) vaccine. You may need a Td booster every 10 years. Zoster vaccine. You may need this after age 71. Pneumococcal 13-valent conjugate (PCV13) vaccine. You may need this if you have certain conditions and were not previously vaccinated. Pneumococcal polysaccharide (PPSV23) vaccine. You may need one or two doses if you smoke cigarettes or if you have certain conditions. Talk to your health care provider about which screenings and vaccines you need and how often you need them. This information is not intended to replace advice given to you by your health care provider. Make sure you discuss any questions you have with your health care provider. Document Released: 10/15/2015 Document Revised: 06/07/2016 Document Reviewed: 07/20/2015 Elsevier Interactive Patient Education  2017 [place] Prevention in the Home Falls can cause injuries. They can happen to people of all ages. There are many things you can do to make your home safe and to help prevent falls. What can I do on the outside of my home? Regularly fix the edges of walkways and driveways and fix any cracks. Remove anything that might make you trip as you walk through a door, such as a raised step or threshold. Trim any bushes or trees on the path to your home. Use bright outdoor lighting. Clear any walking paths of anything that might make someone trip, such as rocks or tools. Regularly check to see if handrails are loose or broken. Make sure that both sides of any steps have handrails. Any raised decks and porches should have guardrails on the edges. Have any leaves, snow, or ice cleared regularly. Use  sand or salt on walking paths during winter. Clean up any spills in your garage right away. This includes oil or grease spills. What can I do in the bathroom? Use night lights. Install grab bars by the toilet and in the tub and shower. Do not use towel bars as grab bars. Use non-skid mats or decals in the tub or shower. If you need to sit down in the shower, use a plastic, non-slip stool. Keep the floor dry. Clean up any water that spills on the floor as soon as it happens. Remove soap buildup in the tub or shower regularly. Attach bath mats securely with double-sided non-slip rug tape. Do not have throw rugs and other things on the floor that can make you trip. What can I do in the bedroom? Use night lights. Make sure that you have a light by your bed that is easy to reach. Do not use any sheets or blankets that are too big for your bed. They should not hang down onto the floor. Have a firm chair that has side arms. You can use this for support while you get dressed. Do not have throw rugs and other things  on the floor that can make you trip. What can I do in the kitchen? Clean up any spills right away. Avoid walking on wet floors. Keep items that you use a lot in easy-to-reach places. If you need to reach something above you, use a strong step stool that has a grab bar. Keep electrical cords out of the way. Do not use floor polish or wax that makes floors slippery. If you must use wax, use non-skid floor wax. Do not have throw rugs and other things on the floor that can make you trip. What can I do with my stairs? Do not leave any items on the stairs. Make sure that there are handrails on both sides of the stairs and use them. Fix handrails that are broken or loose. Make sure that handrails are as long as the stairways. Check any carpeting to make sure that it is firmly attached to the stairs. Fix any carpet that is loose or worn. Avoid having throw rugs at the top or bottom of the  stairs. If you do have throw rugs, attach them to the floor with carpet tape. Make sure that you have a light switch at the top of the stairs and the bottom of the stairs. If you do not have them, ask someone to add them for you. What else can I do to help prevent falls? Wear shoes that: Do not have high heels. Have rubber bottoms. Are comfortable and fit you well. Are closed at the toe. Do not wear sandals. If you use a stepladder: Make sure that it is fully opened. Do not climb a closed stepladder. Make sure that both sides of the stepladder are locked into place. Ask someone to hold it for you, if possible. Clearly mark and make sure that you can see: Any grab bars or handrails. First and last steps. Where the edge of each step is. Use tools that help you move around (mobility aids) if they are needed. These include: Canes. Walkers. Scooters. Crutches. Turn on the lights when you go into a dark area. Replace any light bulbs as soon as they burn out. Set up your furniture so you have a clear path. Avoid moving your furniture around. If any of your floors are uneven, fix them. If there are any pets around you, be aware of where they are. Review your medicines with your doctor. Some medicines can make you feel dizzy. This can increase your chance of falling. Ask your doctor what other things that you can do to help prevent falls. This information is not intended to replace advice given to you by your health care provider. Make sure you discuss any questions you have with your health care provider. Document Released: 07/15/2009 Document Revised: 02/24/2016 Document Reviewed: 10/23/2014 Elsevier Interactive Patient Education  2017 [employer].

## 2022-12-22 NOTE — Progress Notes (Signed)
Provider was contacted by patient's sister Marasia Leighton, [phone]) regarding patient's inability to sleep.  Provider recommended ramelteon 8 mg at bedtime for the management of patient's sleep disturbances.  Patient's sister agreed to the medication and states that she would bring the medication to the attention of the patient.  Patient's medication to be e-prescribed to pharmacy of choice.

## 2022-12-22 NOTE — Progress Notes (Signed)
I connected with  Jody [name] on 12/22/22 by a audio enabled telemedicine application and verified that I am speaking with the correct person using two identifiers.  Patient Location: Home  Provider Location: Office/Clinic  I discussed the limitations of evaluation and management by telemedicine. The patient expressed understanding and agreed to proceed.  Subjective:   Jody [name] is a 52 y.o. female who presents for Medicare Annual (Subsequent) preventive examination.  Review of Systems     Cardiac Risk Factors include: none     Objective:    Today's Vitals   12/22/22 1426  Weight: 180 lb (81.6 kg)  Height: 5\' 4"  (1.626 m)   Body mass index is 30.9 kg/m.     12/22/2022    2:30 PM 04/08/2022    6:17 PM 01/30/2022   11:49 PM 03/14/2019    9:00 AM 03/21/2017    9:24 AM 02/01/2017   10:15 AM 11/01/2016   10:37 AM  Advanced Directives  Does Patient Have a Medical Advance Directive? No  No No No No No  Would patient like information on creating a medical advance directive?   No - Patient declined No - Patient declined        Information is confidential and restricted. Go to Review Flowsheets to unlock data.    Current Medications (verified) Outpatient Encounter Medications as of 12/22/2022  Medication Sig   atorvastatin (LIPITOR) 10 MG tablet Take 1 tablet (10 mg total) by mouth daily.   cloNIDine (CATAPRES) 0.1 MG tablet Take 1 tablet (0.1 mg total) by mouth at bedtime.   Deutetrabenazine (AUSTEDO PATIENT TITRATION KIT) 6 & 9 & 12 MG TBPK Take 6-30 mg by mouth 2 (two) times daily.   Deutetrabenazine ER (AUSTEDO XR) 24 MG TB24 Take 24 mg by mouth daily.   donepezil (ARICEPT) 5 MG tablet Take 1 tablet (5 mg total) by mouth at bedtime.   FLUoxetine (PROZAC) 40 MG capsule Take 1 capsule (40 mg total) by mouth daily.   hydrochlorothiazide (HYDRODIURIL) 25 MG tablet Take 1 tablet (25 mg total) by mouth daily.   hydrOXYzine (ATARAX) 50 MG tablet Take 1 tablet (50 mg total)  by mouth every 8 (eight) hours as needed for anxiety.   Melatonin 10 MG TABS Take 10 mg by mouth at bedtime.   mirtazapine (REMERON) 15 MG tablet Take 1 tablet (15 mg total) by mouth at bedtime.   QUEtiapine (SEROQUEL) 300 MG tablet Take 1 tablet (300 mg total) by mouth at bedtime.   ferrous sulfate (FEROSUL) 325 (65 FE) MG tablet Take 1 tablet (325 mg total) by mouth daily with breakfast. (Patient not taking: Reported on 12/22/2022)   hydrOXYzine (ATARAX) 50 MG tablet Take 1 tablet (50 mg total) by mouth every 8 (eight) hours as needed for anxiety.   Vitamin D, Ergocalciferol, (DRISDOL) 1.25 MG (50000 UNIT) CAPS capsule TAKE 1 CAPSULE BY MOUTH ONCE A WEEK (Patient not taking: Reported on 12/22/2022)   No facility-administered encounter medications on file as of 12/22/2022.    Allergies (verified) Penicillins   History: Past Medical History:  Diagnosis Date   Anxiety and depression    Bipolar disorder ([place])    Cystitis, interstitial    Elevated troponin    a. 09/2015: CP/elevated troponin up to 0.11 - unclear significance. CT neg for PE, LHC neg for CAD, normal echo.   Hypertension    Microcytic anemia    Noted on labs   Panic attack    Paranoid schizophrenia ([place])  Tobacco abuse    Past Surgical History:  Procedure Laterality Date   CARDIAC CATHETERIZATION N/A 09/29/2015   Procedure: Left Heart Cath and Coronary Angiography;  Surgeon: Jettie Booze, MD;  Location: [facility];  Service: Cardiovascular;  Laterality: N/A;   TUBAL LIGATION     Family History  Problem Relation Age of Onset   Diabetes Father    Hypertension Mother    Hypertension Sister    Lupus Sister    Heart disease Maternal Grandfather    Lupus Other    Social History   Socioeconomic History   Marital status: Single    Spouse name: Not on file   Number of children: 5   Years of education: 12   Highest education level: Not on file  Occupational History   Occupation: Disabled  Tobacco  Use   Smoking status: Former    Packs/day: 0.25    Years: 4.00    Additional pack years: 0.00    Total pack years: 1.00    Types: Cigarettes    Quit date: 05/01/2016    Years since quitting: 6.6   Smokeless tobacco: Never  Vaping Use   Vaping Use: Never used  Substance and Sexual Activity   Alcohol use: Not Currently    Alcohol/week: 0.0 standard drinks of alcohol    Comment: Quit 2017   Drug use: No   Sexual activity: Not Currently    Birth control/protection: Condom  Other Topics Concern   Not on file  Social History Narrative   Lives alone   Caffeine use: Soda- daily   Right-handed   Social Determinants of Health   Financial Resource Strain: Low Risk  (12/22/2022)   Overall Financial Resource Strain (CARDIA)    Difficulty of Paying Living Expenses: Not hard at all  Food Insecurity: No Food Insecurity (12/22/2022)   Hunger Vital Sign    Worried About Running Out of Food in the Last Year: Never true    Ran Out of Food in the Last Year: Never true  Transportation Needs: No Transportation Needs (12/22/2022)   PRAPARE - Hydrologist (Medical): No    Lack of Transportation (Non-Medical): No  Physical Activity: Sufficiently Active (12/22/2022)   Exercise Vital Sign    Days of Exercise per Week: 3 days    Minutes of Exercise per Session: 90 min  Stress: No Stress Concern Present (12/22/2022)   [place]    Feeling of Stress : Only a little  Social Connections: Not on file    Tobacco Counseling Counseling given: Not Answered   Clinical Intake:  Pre-visit preparation completed: Yes  Pain : No/denies pain     Nutritional Status: BMI > 30  Obese Nutritional Risks: None Diabetes: No  How often do you need to have someone help you when you read instructions, pamphlets, or other written materials from your doctor or pharmacy?: 1 - Never  Diabetic? no  Interpreter Needed?:  No  Information entered by :: NAllen LPN   Activities of Daily Living    12/22/2022    2:31 PM 04/08/2022    6:00 PM  In your present state of health, do you have any difficulty performing the following activities:  Hearing? 0   Vision? 0   Difficulty concentrating or making decisions? 1   Walking or climbing stairs? 0   Dressing or bathing? 0   Doing errands, shopping? 0   Preparing Food and  eating ? N   Using the Toilet? N   In the past six months, have you accidently leaked urine? Y   Do you have problems with loss of bowel control? N   Managing your Medications? Y   Comment sister sets up   Managing your Finances? N   Housekeeping or managing your Housekeeping? N      Information is confidential and restricted. Go to Review Flowsheets to unlock data.    Patient Care Team: Gildardo Pounds, NP as PCP - General (Nurse Practitioner)  Indicate any recent Medical Services you may have received from other than [facility] providers in the past year (date may be approximate).     Assessment:   This is a routine wellness examination for [place].  Hearing/Vision screen Vision Screening - Comments:: Regular eye exams, Dr. Gershon Crane  Dietary issues and exercise activities discussed: Current Exercise Habits: Home exercise routine, Type of exercise: treadmill;strength training/weights, Time (Minutes): > 60, Frequency (Times/Week): 3, Weekly Exercise (Minutes/Week): 0   Goals Addressed             This Visit's Progress    Patient Stated       12/22/2022, wants to lose weight       Depression Screen    12/22/2022    2:31 PM 12/08/2022    1:39 PM 11/21/2022    4:14 PM 11/07/2022   10:14 AM 10/27/2022    9:40 AM 10/10/2022   10:38 AM 01/30/2022    3:00 PM  PHQ 2/9 Scores  PHQ - 2 Score 0     2 3  PHQ- 9 Score      11 12     Information is confidential and restricted. Go to Review Flowsheets to unlock data.    Fall Risk    12/22/2022    2:31 PM 10/10/2022   10:32 AM 12/28/2021    11:24 AM 05/27/2021    9:28 AM 11/06/2019   10:28 AM  Fall Risk   Falls in the past year? 0 0 0 0 0  Number falls in past yr: 0 0 0 0 0  Injury with Fall? 0 0 0 0   Risk for fall due to : Medication side effect  No Fall Risks    Follow up Falls prevention discussed;Education provided;Falls evaluation completed Falls evaluation completed       FALL RISK PREVENTION PERTAINING TO THE HOME:  Any stairs in or around the home? No  If so, are there any without handrails? N/a Home free of loose throw rugs in walkways, pet beds, electrical cords, etc? Yes  Adequate lighting in your home to reduce risk of falls? Yes   ASSISTIVE DEVICES UTILIZED TO PREVENT FALLS:  Life alert? No  Use of a cane, walker or w/c? No  Grab bars in the bathroom? No  Shower chair or bench in shower? No  Elevated toilet seat or a handicapped toilet? No   TIMED UP AND GO:  Was the test performed? No .       Cognitive Function:    01/31/2017    8:45 AM 11/27/2016   11:39 AM 06/28/2016    3:46 PM 05/25/2016    5:03 PM 03/11/2015    9:31 AM  MMSE - Mini Mental State Exam  Orientation to time 5 0 2 2 4   Orientation to Place 4 2 3 3 5   Registration 3 2 3 3 3   Attention/ Calculation 2 0 0 0 5  Recall 2 0 1  2 1  Language- name 2 objects 2 2 2 2 2   Language- repeat 1 1 1  0 1  Language- follow 3 step command 3 2 3 3 3   Language- read & follow direction 1 1 1  0 1  Write a sentence 1 0 1 0 1  Copy design 1 1 1  0 0  Total score 25 11 18 15 26         12/22/2022    2:32 PM  6CIT Screen  What Year? 0 points  What month? 0 points  What time? 0 points  Count back from 20 0 points  Months in reverse 4 points  Repeat phrase 6 points  Total Score 10 points    Immunizations Immunization History  Administered Date(s) Administered   Influenza,inj,Quad PF,6+ Mos 07/05/2014, 09/22/2014   Moderna Sars-Covid-2 Vaccination 01/01/2020, 02/03/2020, 10/14/2020   Pneumococcal Polysaccharide-23 07/05/2014   Tdap  02/01/2017    TDAP status: Up to date  Flu Vaccine status: Declined, Education has been provided regarding the importance of this vaccine but patient still declined. Advised may receive this vaccine at local pharmacy or Health Dept. Aware to provide a copy of the vaccination record if obtained from local pharmacy or Health Dept. Verbalized acceptance and understanding.  Pneumococcal vaccine status: Up to date  Covid-19 vaccine status: Completed vaccines  Qualifies for Shingles Vaccine? No   Zostavax completed  n/a   Shingrix Completed?: n/a  Screening Tests Health Maintenance  Topic Date Due   COLONOSCOPY (Pts 45-40yrs Insurance coverage will need to be confirmed)  Never done   PAP SMEAR-Modifier  03/21/2020   COVID-19 Vaccine (4 - 2023-24 season) 06/02/2022   Medicare Annual Wellness (AWV)  06/26/2022   INFLUENZA VACCINE  12/31/2022 (Originally 05/02/2022)   DTaP/Tdap/Td (2 - Td or Tdap) 02/02/2027   Hepatitis C Screening  Completed   HIV Screening  Completed   HPV VACCINES  Aged Out    Health Maintenance  Health Maintenance Due  Topic Date Due   COLONOSCOPY (Pts 45-67yrs Insurance coverage will need to be confirmed)  Never done   PAP SMEAR-Modifier  03/21/2020   COVID-19 Vaccine (4 - 2023-24 season) 06/02/2022   Medicare Annual Wellness (AWV)  06/26/2022    Colorectal cancer screening: due  Mammogram status: Completed 01/16/2022. Repeat every year  Bone Density status: n/a  Lung Cancer Screening: (Low Dose CT Chest recommended if Age 62-80 years, 30 pack-year currently smoking OR have quit w/in 15years.) does not qualify.   Lung Cancer Screening Referral: no  Additional Screening:  Hepatitis C Screening: does qualify; Completed 11/15/2020  Vision Screening: Recommended annual ophthalmology exams for early detection of glaucoma and other disorders of the eye. Is the patient up to date with their annual eye exam?  Yes  Who is the provider or what is the name of the  office in which the patient attends annual eye exams? Dr. Gershon Crane If pt is not established with a provider, would they like to be referred to a provider to establish care? No .   Dental Screening: Recommended annual dental exams for proper oral hygiene  Community Resource Referral / Chronic Care Management: CRR required this visit?  No   CCM required this visit?  No      Plan:     I have personally reviewed and noted the following in the patient's chart:   Medical and social history Use of alcohol, tobacco or illicit drugs  Current medications and supplements including opioid prescriptions. Patient is not  currently taking opioid prescriptions. Functional ability and status Nutritional status Physical activity Advanced directives List of other physicians Hospitalizations, surgeries, and ER visits in previous 12 months Vitals Screenings to include cognitive, depression, and falls Referrals and appointments  In addition, I have reviewed and discussed with patient certain preventive protocols, quality metrics, and best practice recommendations. A written personalized care plan for preventive services as well as general preventive health recommendations were provided to patient.     Kellie Simmering, LPN   [id number]   Nurse Notes: none  Due to this being a virtual visit, the after visit summary with patients personalized plan was offered to patient via mail or my-chart.  to pick up at office at next visit

## 2023-01-02 ENCOUNTER — Telehealth: Payer: Self-pay

## 2023-01-02 ENCOUNTER — Telehealth: Payer: Self-pay | Admitting: Nurse Practitioner

## 2023-01-02 ENCOUNTER — Ambulatory Visit (HOSPITAL_BASED_OUTPATIENT_CLINIC_OR_DEPARTMENT_OTHER): Payer: Medicare Other | Admitting: Nurse Practitioner

## 2023-01-02 ENCOUNTER — Encounter: Payer: Self-pay | Admitting: Nurse Practitioner

## 2023-01-02 ENCOUNTER — Other Ambulatory Visit (HOSPITAL_COMMUNITY)
Admission: RE | Admit: 2023-01-02 | Discharge: 2023-01-02 | Disposition: A | Discharge status: Home / Self Care | Payer: Medicare Other | Source: Ambulatory Visit | Attending: Nurse Practitioner | Admitting: Nurse Practitioner

## 2023-01-02 ENCOUNTER — Other Ambulatory Visit: Payer: Self-pay | Admitting: Nurse Practitioner

## 2023-01-02 VITALS — BP 122/60 | HR 75 | Ht 64.0 in | Wt 185.2 lb

## 2023-01-02 DIAGNOSIS — D649 Anemia, unspecified: Secondary | ICD-10-CM | POA: Insufficient documentation

## 2023-01-02 DIAGNOSIS — Z01419 Encounter for gynecological examination (general) (routine) without abnormal findings: Secondary | ICD-10-CM | POA: Insufficient documentation

## 2023-01-02 DIAGNOSIS — Z79899 Other long term (current) drug therapy: Secondary | ICD-10-CM | POA: Insufficient documentation

## 2023-01-02 DIAGNOSIS — N951 Menopausal and female climacteric states: Secondary | ICD-10-CM | POA: Insufficient documentation

## 2023-01-02 DIAGNOSIS — Z862 Personal history of diseases of the blood and blood-forming organs and certain disorders involving the immune mechanism: Secondary | ICD-10-CM

## 2023-01-02 DIAGNOSIS — R232 Flushing: Secondary | ICD-10-CM

## 2023-01-02 DIAGNOSIS — Z124 Encounter for screening for malignant neoplasm of cervix: Secondary | ICD-10-CM | POA: Diagnosis not present

## 2023-01-02 DIAGNOSIS — I10 Essential (primary) hypertension: Secondary | ICD-10-CM | POA: Insufficient documentation

## 2023-01-02 DIAGNOSIS — Z1151 Encounter for screening for human papillomavirus (HPV): Secondary | ICD-10-CM | POA: Insufficient documentation

## 2023-01-02 DIAGNOSIS — Z1211 Encounter for screening for malignant neoplasm of colon: Secondary | ICD-10-CM | POA: Diagnosis not present

## 2023-01-02 DIAGNOSIS — E7800 Pure hypercholesterolemia, unspecified: Secondary | ICD-10-CM | POA: Insufficient documentation

## 2023-01-02 MED ORDER — GABAPENTIN 100 MG PO CAPS
100.0000 mg | ORAL_CAPSULE | Freq: Two times a day (BID) | ORAL | 3 refills | Status: DC
Start: 2023-01-02 — End: 2023-01-02

## 2023-01-02 MED ORDER — HYDROCHLOROTHIAZIDE 25 MG PO TABS
25.0000 mg | ORAL_TABLET | Freq: Every day | ORAL | 1 refills | Status: DC
Start: 2023-01-02 — End: 2023-09-21

## 2023-01-02 MED ORDER — GABAPENTIN 100 MG PO CAPS
100.0000 mg | ORAL_CAPSULE | Freq: Two times a day (BID) | ORAL | 1 refills | Status: AC
Start: 2023-01-02 — End: ?

## 2023-01-02 MED ORDER — FERROUS SULFATE 325 (65 FE) MG PO TABS
325.0000 mg | ORAL_TABLET | Freq: Every day | ORAL | 3 refills | Status: DC
Start: 2023-01-02 — End: 2024-01-09

## 2023-01-02 MED ORDER — ATORVASTATIN CALCIUM 10 MG PO TABS: 10.0000 mg | ORAL_TABLET | Freq: Every day | ORAL | 3 refills | Status: DC

## 2023-01-02 NOTE — Progress Notes (Signed)
Assessment & Plan:  Jody [name] was seen today for gynecologic exam.  Diagnoses and all orders for this visit:  Encounter for Papanicolaou smear for cervical cancer screening -     Cytology - PAP -     Cervicovaginal ancillary only  Essential hypertension -     hydrochlorothiazide (HYDRODIURIL) 25 MG tablet; Take 1 tablet (25 mg total) by mouth daily.  History of anemia -     ferrous sulfate (FEROSUL) 325 (65 FE) MG tablet; Take 1 tablet (325 mg total) by mouth daily with breakfast.  Hypercholesterolemia -     atorvastatin (LIPITOR) 10 MG tablet; Take 1 tablet (10 mg total) by mouth daily.  Hot flashes -     gabapentin (NEURONTIN) 100 MG capsule; Take 1 capsule (100 mg total) by mouth 2 (two) times daily. Once in the am and once in the pm  Colon cancer screening -     Ambulatory referral to Gastroenterology    Patient has been counseled on age-appropriate routine health concerns for screening and prevention. These are reviewed and up-to-date. Referrals have been placed accordingly. Immunizations are up-to-date or declined.    Subjective:   Chief Complaint  Patient presents with   Gynecologic Exam   Gynecologic Exam Pertinent negatives include no abdominal pain, chills, fever, flank pain or rash.   Jody [name] 53 y.o. female presents to office today for PAP smear. She endorses daily hot flashes occurring numerous times throughout the day and night.   She is due for mammogram this month and has been given the number to the imaging center to schedule  BP Readings from Last 3 Encounters:  01/02/23 (!) 121/56  10/10/22 119/80  06/26/22 112/79    Review of Systems  Constitutional:  Negative for chills, fever, malaise/fatigue and weight loss.       HOT FLASHES  Respiratory: Negative.  Negative for cough, shortness of breath and wheezing.   Cardiovascular: Negative.  Negative for chest pain, orthopnea and leg swelling.  Gastrointestinal:  Negative for abdominal  pain.  Genitourinary: Negative.  Negative for flank pain.  Skin: Negative.  Negative for rash.  Psychiatric/Behavioral:  Negative for suicidal ideas.     Past Medical History:  Diagnosis Date   Anxiety and depression    Bipolar disorder    Cystitis, interstitial    Elevated troponin    a. 09/2015: CP/elevated troponin up to 0.11 - unclear significance. CT neg for PE, LHC neg for CAD, normal echo.   Hypertension    Microcytic anemia    Noted on labs   Panic attack    Paranoid schizophrenia    Tobacco abuse     Past Surgical History:  Procedure Laterality Date   CARDIAC CATHETERIZATION N/A 09/29/2015   Procedure: Left Heart Cath and Coronary Angiography;  Surgeon: Jettie Booze, MD;  Location: [facility];  Service: Cardiovascular;  Laterality: N/A;   TUBAL LIGATION      Family History  Problem Relation Age of Onset   Diabetes Father    Hypertension Mother    Hypertension Sister    Lupus Sister    Heart disease Maternal Grandfather    Lupus Other     Social History Reviewed with no changes to be made today.   Outpatient Medications Prior to Visit  Medication Sig Dispense Refill   cloNIDine (CATAPRES) 0.1 MG tablet Take 1 tablet (0.1 mg total) by mouth at bedtime. 90 tablet 1   Deutetrabenazine (AUSTEDO PATIENT TITRATION KIT) 6 &  9 & 12 MG TBPK Take 6-30 mg by mouth 2 (two) times daily. 1 each 0   Deutetrabenazine ER (AUSTEDO XR) 24 MG TB24 Take 24 mg by mouth daily. 30 tablet 0   FLUoxetine (PROZAC) 40 MG capsule Take 1 capsule (40 mg total) by mouth daily. 90 capsule 2   Melatonin 10 MG TABS Take 10 mg by mouth at bedtime. 90 tablet 2   mirtazapine (REMERON) 15 MG tablet Take 1 tablet (15 mg total) by mouth at bedtime. 90 tablet 2   QUEtiapine (SEROQUEL) 300 MG tablet Take 1 tablet (300 mg total) by mouth at bedtime. 30 tablet 2   ramelteon (ROZEREM) 8 MG tablet Take 1 tablet (8 mg total) by mouth at bedtime. 30 tablet 0   atorvastatin (LIPITOR) 10 MG  tablet Take 1 tablet (10 mg total) by mouth daily. 90 tablet 3   ferrous sulfate (FEROSUL) 325 (65 FE) MG tablet Take 1 tablet (325 mg total) by mouth daily with breakfast. 90 tablet 3   hydrochlorothiazide (HYDRODIURIL) 25 MG tablet Take 1 tablet (25 mg total) by mouth daily. 90 tablet 1   donepezil (ARICEPT) 5 MG tablet Take 1 tablet (5 mg total) by mouth at bedtime. (Patient not taking: Reported on 01/02/2023) 90 tablet 1   hydrOXYzine (ATARAX) 50 MG tablet Take 1 tablet (50 mg total) by mouth every 8 (eight) hours as needed for anxiety. (Patient not taking: Reported on 01/02/2023) 42 tablet 0   hydrOXYzine (ATARAX) 50 MG tablet Take 1 tablet (50 mg total) by mouth every 8 (eight) hours as needed for anxiety. (Patient not taking: Reported on 01/02/2023) 42 tablet 0   Vitamin D, Ergocalciferol, (DRISDOL) 1.25 MG (50000 UNIT) CAPS capsule TAKE 1 CAPSULE BY MOUTH ONCE A WEEK (Patient not taking: Reported on 12/22/2022) 4 capsule 1   No facility-administered medications prior to visit.    Allergies  Allergen Reactions   Penicillins Itching and Rash       Objective:    BP (!) 121/56   Pulse 75   Ht 5\' 4"  (1.626 m)   Wt 185 lb 3.2 oz (84 kg)   LMP  (LMP Unknown)   SpO2 99%   BMI 31.79 kg/m  Wt Readings from Last 3 Encounters:  01/02/23 185 lb 3.2 oz (84 kg)  12/22/22 180 lb (81.6 kg)  10/10/22 198 lb 9.6 oz (90.1 kg)    Physical Exam Exam conducted with a chaperone present.  Constitutional:      Appearance: She is well-developed.  HENT:     Head: Normocephalic.  Cardiovascular:     Rate and Rhythm: Normal rate and regular rhythm.     Heart sounds: Normal heart sounds.  Pulmonary:     Effort: Pulmonary effort is normal.     Breath sounds: Normal breath sounds.  Abdominal:     General: Bowel sounds are normal.     Palpations: Abdomen is soft.     Hernia: There is no hernia in the left inguinal area.  Genitourinary:    Exam position: Lithotomy position.     Labia:        Right:  No rash, tenderness, lesion or injury.        Left: No rash, tenderness, lesion or injury.      Vagina: Normal. No signs of injury and foreign body. No vaginal discharge, erythema, tenderness or bleeding.     Cervix: Normal.     Uterus: Not deviated and not enlarged.  Adnexa:        Right: No mass, tenderness or fullness.         Left: No mass, tenderness or fullness.       Rectum: Normal. No external hemorrhoid.  Lymphadenopathy:     Lower Body: No right inguinal adenopathy. No left inguinal adenopathy.  Skin:    General: Skin is warm and dry.  Neurological:     Mental Status: She is alert and oriented to person, place, and time.  Psychiatric:        Behavior: Behavior normal.        Thought Content: Thought content normal.        Judgment: Judgment normal.          Patient has been counseled extensively about nutrition and exercise as well as the importance of adherence with medications and regular follow-up. The patient was given clear instructions to go to ER or return to medical center if symptoms don't improve, worsen or new problems develop. The patient verbalized understanding.   Follow-up: Return in about 4 months (around 05/04/2023) for prediabetes.   Gildardo Pounds, FNP-BC [facility] [place], [place]   01/02/2023, 11:10 AM

## 2023-01-02 NOTE — Telephone Encounter (Signed)
Medication Refill - Medication:   Sharyn Lull from [facility].   She is questioning the quantity vs the amount of times the patient is taking the Gabapentin in  day  She states it does not match  CB#  [id number]

## 2023-01-02 NOTE — Patient Instructions (Signed)
[facility] ?Located in: [facility] ?Address: [address], , [place] [address] ?Phone: [phone] ?

## 2023-01-02 NOTE — Progress Notes (Signed)
5'4 MEDICAINE FOR HOT FLASHES.

## 2023-01-02 NOTE — Telephone Encounter (Signed)
Copied from [place] [id number]. Topic: General - Other >> Jan 02, 2023  3:14 PM [place] B wrote: Reason for CRM: pt called in wanted to know when her last colonoscopy was. Please call back

## 2023-01-03 LAB — CERVICOVAGINAL ANCILLARY ONLY
Bacterial Vaginitis (gardnerella): NEGATIVE
Candida Glabrata: NEGATIVE
Candida Vaginitis: NEGATIVE
Chlamydia: NEGATIVE
Comment: NEGATIVE
Comment: NEGATIVE
Comment: NEGATIVE
Comment: NEGATIVE
Comment: NEGATIVE
Comment: NORMAL
Neisseria Gonorrhea: NEGATIVE
Trichomonas: NEGATIVE

## 2023-01-03 NOTE — Telephone Encounter (Signed)
Return call unanswered.  

## 2023-01-04 LAB — CYTOLOGY - PAP
Comment: NEGATIVE
Diagnosis: NEGATIVE
High risk HPV: NEGATIVE

## 2023-01-15 ENCOUNTER — Ambulatory Visit (HOSPITAL_COMMUNITY): Payer: Medicare Other | Admitting: Clinical

## 2023-01-16 ENCOUNTER — Ambulatory Visit (HOSPITAL_COMMUNITY): Payer: Medicare Other | Admitting: Clinical

## 2023-01-16 ENCOUNTER — Encounter (HOSPITAL_COMMUNITY): Payer: Self-pay

## 2023-01-16 ENCOUNTER — Other Ambulatory Visit (HOSPITAL_COMMUNITY): Payer: Self-pay | Admitting: Physician Assistant

## 2023-01-16 DIAGNOSIS — F251 Schizoaffective disorder, depressive type: Secondary | ICD-10-CM

## 2023-01-16 DIAGNOSIS — G479 Sleep disorder, unspecified: Secondary | ICD-10-CM

## 2023-01-16 DIAGNOSIS — G2401 Drug induced subacute dyskinesia: Secondary | ICD-10-CM

## 2023-01-19 ENCOUNTER — Ambulatory Visit (INDEPENDENT_AMBULATORY_CARE_PROVIDER_SITE_OTHER): Payer: Medicare Other | Admitting: Physician Assistant

## 2023-01-19 DIAGNOSIS — F0390 Unspecified dementia without behavioral disturbance: Secondary | ICD-10-CM

## 2023-01-19 DIAGNOSIS — G479 Sleep disorder, unspecified: Secondary | ICD-10-CM | POA: Diagnosis not present

## 2023-01-19 DIAGNOSIS — F333 Major depressive disorder, recurrent, severe with psychotic symptoms: Secondary | ICD-10-CM

## 2023-01-19 DIAGNOSIS — G2401 Drug induced subacute dyskinesia: Secondary | ICD-10-CM

## 2023-01-19 DIAGNOSIS — F251 Schizoaffective disorder, depressive type: Secondary | ICD-10-CM

## 2023-01-19 MED ORDER — FLUOXETINE HCL 40 MG PO CAPS
40.0000 mg | ORAL_CAPSULE | Freq: Every day | ORAL | 2 refills | Status: DC
Start: 2023-01-19 — End: 2023-03-05

## 2023-01-19 MED ORDER — MIRTAZAPINE 15 MG PO TABS
15.0000 mg | ORAL_TABLET | Freq: Every day | ORAL | 2 refills | Status: DC
Start: 2023-01-19 — End: 2023-03-05

## 2023-01-19 MED ORDER — RAMELTEON 8 MG PO TABS
8.0000 mg | ORAL_TABLET | Freq: Every day | ORAL | 2 refills | Status: DC
Start: 2023-01-19 — End: 2023-03-05

## 2023-01-19 MED ORDER — CLONIDINE HCL 0.1 MG PO TABS
0.1000 mg | ORAL_TABLET | Freq: Every day | ORAL | 2 refills | Status: DC
Start: 2023-01-19 — End: 2024-01-11

## 2023-01-19 MED ORDER — AUSTEDO XR 24 MG PO TB24
24.0000 mg | ORAL_TABLET | Freq: Every day | ORAL | 2 refills | Status: DC
Start: 2023-01-19 — End: 2023-03-05

## 2023-01-19 MED ORDER — QUETIAPINE FUMARATE 300 MG PO TABS
300.0000 mg | ORAL_TABLET | Freq: Every day | ORAL | 2 refills | Status: DC
Start: 2023-01-19 — End: 2023-03-05

## 2023-01-21 ENCOUNTER — Encounter (HOSPITAL_COMMUNITY): Payer: Self-pay | Admitting: Physician Assistant

## 2023-01-21 NOTE — Progress Notes (Unsigned)
BH MD/PA/NP OP Progress Note  01/21/2023 5:49 PM Jody [name]  MRN:  [id number]  Chief Complaint:  Chief Complaint  Patient presents with   Follow-up   Medication Refill   HPI:   Jody [name] is a 52 year old, African-American female with a past psychiatric history significant for schizoaffective disorder (depressed type), major depressive disorder, generalized anxiety disorder, sleep disturbances, and major neurocognitive disorder who presents to [facility], accompanied by her sister Jody [name], [phone]), for follow up and medication management. Patient is currently being managed on the following psychiatric medications:  Melatonin 10 mg at bedtime Seroquel 300 mg at bedtime Prozac 40 mg daily Mirtazapine 15 mg at bedtime Ramelteon 8 mg at bedtime Austedo XR 24 mg daily  Patient presents to the encounter stating that there are some nights that she is unable to sleep, while on other nights she is able to sleep for roughly 4 to 5 hours at a time.  Patient reports that she has been taking her ramelteon as scheduled but still continues to experience sleep disturbances.  Although patient struggles with sleep, patient's sister believes that the medications have been the most helpful they have been in the past.  Patient's sister also states that she believes that the patient's sleep issues are due to the recent stressor of being forced to move from her previous residence.  Patient's sister believes that once the patient is settled in her new location, her sleeping much improved.  Patient reports that her other medications are going well.  The only concerns that she has regarding her other medications is that she appears to be running out prior to her next appointment.  Patient denies depression or anxiety at this time.  She occasionally endorses paranoia characterized by the feeling that someone is watching her and may come into her house.   She reports that her paranoia has always been present.  A PHQ-9 screen was performed with the patient scoring a 19.  A GAD-7 screen was also performed with the patient scoring a 17.  Patient is alert and oriented x 4, calm, cooperative, and fully engaged in conversation during the encounter.  Patient endorses fair mood.  Patient denies suicidal or homicidal ideations.  She denies active auditory or visual hallucinations but states that she recently thought she heard the doorbell ring the other day.  Patient does not appear to be responding to internal/external stimuli at this time.  Patient endorses sporadic sleep stating that some days she has not able to get sleep while on other days she is able to get 4 to 5 hours of sleep per night.  Patient endorses good appetite and eats on average 3 meals per day.  Patient denies alcohol consumption, tobacco use, and illicit drug use.  Visit Diagnosis:    ICD-10-CM   1. Major neurocognitive disorder  F03.90 cloNIDine (CATAPRES) 0.1 MG tablet    FLUoxetine (PROZAC) 40 MG capsule    2. MDD (major depressive disorder), recurrent, severe, with psychosis  F33.3 mirtazapine (REMERON) 15 MG tablet    FLUoxetine (PROZAC) 40 MG capsule    3. Schizoaffective disorder, depressive type  F25.1 QUEtiapine (SEROQUEL) 300 MG tablet    Deutetrabenazine ER (AUSTEDO XR) 24 MG TB24    4. Sleep disturbances  G47.9 ramelteon (ROZEREM) 8 MG tablet    5. Tardive dyskinesia  G24.01 Deutetrabenazine ER (AUSTEDO XR) 24 MG TB24      Past Psychiatric History:  Schizophrenia - diagnosed roughly 7 years  ago when patient was attending [facility] Patient was diagnosed with schizoaffective disorder (depressive type) when admitted to [facility] from 04/08/2022 - 04/12/2022.  In addition to being diagnosed with schizoaffective disorder, patient was treated for insomnia, generalized anxiety disorder, and tardive dyskinesia.   **Onset of dementia, major  neurocognitive disorder, due to Alzheimer's disease, without behavioral disturbance, mild  Past Medical History:  Past Medical History:  Diagnosis Date   Anxiety and depression    Bipolar disorder    Cystitis, interstitial    Elevated troponin    a. 09/2015: CP/elevated troponin up to 0.11 - unclear significance. CT neg for PE, LHC neg for CAD, normal echo.   Hypertension    Microcytic anemia    Noted on labs   Panic attack    Paranoid schizophrenia    Tobacco abuse     Past Surgical History:  Procedure Laterality Date   CARDIAC CATHETERIZATION N/A 09/29/2015   Procedure: Left Heart Cath and Coronary Angiography;  Surgeon: Corky Crafts, MD;  Location: [facility] INVASIVE CV LAB;  Service: Cardiovascular;  Laterality: N/A;   TUBAL LIGATION      Family Psychiatric History:  Grandmother (maternal) - unsure of the diagnosis but states that her grandmother had mental health issues Uncle (maternal) - Schizophrenia and bipolar disorder, he is currently taking medications but is unsure of the type.   Family history of suicide: Patient denies Family history of homicide: Patient denies Family history of substance abuse: Per patient's sister, alcohol abuse runs in the family  Family History:  Family History  Problem Relation Age of Onset   Diabetes Father    Hypertension Mother    Hypertension Sister    Lupus Sister    Heart disease Maternal Grandfather    Lupus Other     Social History:  Social History   Socioeconomic History   Marital status: Single    Spouse name: Not on file   Number of children: 5   Years of education: 12   Highest education level: Not on file  Occupational History   Occupation: Disabled  Tobacco Use   Smoking status: Former    Packs/day: 0.25    Years: 4.00    Additional pack years: 0.00    Total pack years: 1.00    Types: Cigarettes    Quit date: 05/01/2016    Years since quitting: 6.7   Smokeless tobacco: Never  Vaping Use   Vaping Use:  Never used  Substance and Sexual Activity   Alcohol use: Not Currently    Alcohol/week: 0.0 standard drinks of alcohol    Comment: Quit 2017   Drug use: No   Sexual activity: Not Currently    Birth control/protection: Condom  Other Topics Concern   Not on file  Social History Narrative   Lives alone   Caffeine use: Soda- daily   Right-handed   Social Determinants of Health   Financial Resource Strain: Low Risk  (12/22/2022)   Overall Financial Resource Strain (CARDIA)    Difficulty of Paying Living Expenses: Not hard at all  Food Insecurity: No Food Insecurity (12/22/2022)   Hunger Vital Sign    Worried About Running Out of Food in the Last Year: Never true    Ran Out of Food in the Last Year: Never true  Transportation Needs: No Transportation Needs (12/22/2022)   PRAPARE - Administrator, Civil Service (Medical): No    Lack of Transportation (Non-Medical): No  Physical Activity: Sufficiently  Active (12/22/2022)   Exercise Vital Sign    Days of Exercise per Week: 3 days    Minutes of Exercise per Session: 90 min  Stress: No Stress Concern Present (12/22/2022)   [employer] of Occupational Health - Occupational Stress Questionnaire    Feeling of Stress : Only a little  Social Connections: Not on file    Allergies:  Allergies  Allergen Reactions   Penicillins Itching and Rash    Metabolic Disorder Labs: Lab Results  Component Value Date   HGBA1C 5.9 (H) 10/10/2022   MPG 114.02 04/07/2022   MPG 117 09/28/2015   Lab Results  Component Value Date   PROLACTIN 9.7 12/01/2022   PROLACTIN 17.2 04/07/2022   Lab Results  Component Value Date   CHOL 269 (H) 10/10/2022   TRIG 186 (H) 10/10/2022   HDL 43 10/10/2022   CHOLHDL 6.3 (H) 10/10/2022   VLDL 56 (H) 04/09/2022   LDLCALC 191 (H) 10/10/2022   LDLCALC 147 (H) 04/09/2022   Lab Results  Component Value Date   TSH 5.855 (H) 04/07/2022   TSH 1.560 06/28/2016    Therapeutic Level Labs: No  results found for: "LITHIUM" Lab Results  Component Value Date   VALPROATE 118 (H) 04/26/2016   VALPROATE 136 (H) 04/26/2016   No results found for: "CBMZ"  Current Medications: Current Outpatient Medications  Medication Sig Dispense Refill   atorvastatin (LIPITOR) 10 MG tablet Take 1 tablet (10 mg total) by mouth daily. 90 tablet 3   cloNIDine (CATAPRES) 0.1 MG tablet Take 1 tablet (0.1 mg total) by mouth at bedtime. 90 tablet 2   Deutetrabenazine ER (AUSTEDO XR) 24 MG TB24 Take 24 mg by mouth daily. 30 tablet 2   donepezil (ARICEPT) 5 MG tablet Take 1 tablet (5 mg total) by mouth at bedtime. 90 tablet 1   ferrous sulfate (FEROSUL) 325 (65 FE) MG tablet Take 1 tablet (325 mg total) by mouth daily with breakfast. 90 tablet 3   FLUoxetine (PROZAC) 40 MG capsule Take 1 capsule (40 mg total) by mouth daily. 90 capsule 2   gabapentin (NEURONTIN) 100 MG capsule Take 1 capsule (100 mg total) by mouth 2 (two) times daily. Once in the am and once in the pm 180 capsule 1   hydrochlorothiazide (HYDRODIURIL) 25 MG tablet Take 1 tablet (25 mg total) by mouth daily. 90 tablet 1   hydrOXYzine (ATARAX) 50 MG tablet Take 1 tablet (50 mg total) by mouth every 8 (eight) hours as needed for anxiety. 42 tablet 0   hydrOXYzine (ATARAX) 50 MG tablet Take 1 tablet (50 mg total) by mouth every 8 (eight) hours as needed for anxiety. 42 tablet 0   mirtazapine (REMERON) 15 MG tablet Take 1 tablet (15 mg total) by mouth at bedtime. 90 tablet 2   QUEtiapine (SEROQUEL) 300 MG tablet Take 1 tablet (300 mg total) by mouth at bedtime. 30 tablet 2   ramelteon (ROZEREM) 8 MG tablet Take 1 tablet (8 mg total) by mouth at bedtime. Patient can take an additional dose (1 tablet, 8 mg total) if patient continues to have issues with sleep. 30 tablet 2   No current facility-administered medications for this visit.     Musculoskeletal: Strength & Muscle Tone: within normal limits Gait & Station: normal Patient leans:  N/A  Psychiatric Specialty Exam: Review of Systems  Neurological:  Positive for tremors.  Psychiatric/Behavioral:  Positive for sleep disturbance. Negative for decreased concentration, dysphoric mood, hallucinations, self-injury and suicidal ideas. The  patient is nervous/anxious. The patient is not hyperactive.     Blood pressure 107/73, pulse 73, height 5\' 3"  (1.6 m), weight 185 lb 9.6 oz (84.2 kg).Body mass index is 32.88 kg/m.  General Appearance: Casual  Eye Contact:  Good  Speech:  Clear and Coherent and Normal Rate  Volume:  Normal  Mood:  Euthymic  Affect:  Appropriate  Thought Process:  Coherent, Goal Directed, and Descriptions of Associations: Intact  Orientation:  Full (Time, Place, and Person)  Thought Content: WDL and Paranoid Ideation   Suicidal Thoughts:  No  Homicidal Thoughts:  No  Memory:  Immediate;   Good Recent;   Fair Remote;   Fair  Judgement:  Good  Insight:  Good  Psychomotor Activity:  TD  Concentration:  Concentration: Good and Attention Span: Good  Recall:  Good  Fund of Knowledge: Fair  Language: Good  Akathisia:  No  Handed:  Right  AIMS (if indicated): done  Assets:  Communication Skills Desire for Improvement Housing Social Support  ADL's:  Intact  Cognition: Impaired,  Mild  Sleep:  Poor   Screenings: AIMS    Flowsheet Row Office Visit from 12/08/2022 in [facility] Clinical Support from 11/21/2022 in [facility] Clinical Support from 11/07/2022 in [facility] Office Visit from 10/27/2022 in [facility]  AIMS Total Score 8 28 24 17 4       AUDIT    Flowsheet Row [facility]) [facility] ([facility] Ad[facility]  (Discharged) [facility]  Alcohol Use Disorder Identification Test Final Score (AUDIT) 9 4 20       GAD-7    Flowsheet Row Clinical Support from 01/19/2023 in [facility] Office Visit from 12/08/2022 in [facility] Clinical Support from 11/07/2022 in [facility] Office Visit from 10/27/2022 in [facility] Office Visit from 10/10/2022 in [facility]  Total GAD-7 Score 17 15 21 21 17       Mini-Mental    Flowsheet Row Office Visit from 01/31/2017 in [facility] Office Visit from 11/27/2016 in [facility] Office Visit from 06/28/2016 in [facility] Office Visit from 05/25/2016 in [facility] Office Visit from 03/11/2015 in [facility]  Total Score (max 30 points ) 25 11 18 15 26       PHQ2-9    Flowsheet Row Clinical Support from 01/19/2023 in [facility] Office Visit from 01/02/2023 in [facility] Clinical Support from 12/22/2022 in [facility] Office Visit from 12/08/2022 in [facility] Clinical Support from 11/21/2022 in [facility]  PHQ-2 Total Score 4 1 0 2 4  PHQ-9 Total Score 19 14 -- 13 17      Flowsheet Row Clinical Support from 01/19/2023 in [facility] Office Visit from 12/08/2022 in [facility] Clinical Support from 11/21/2022 in [facility]  C-SSRS RISK CATEGORY Low Risk Low Risk Low Risk        Assessment and Plan:  Jody [name] is a 52 year old, African-American female with a past psychiatric history  significant for schizoaffective disorder (depressed type), major depressive disorder, generalized anxiety disorder, sleep disturbances, and major neurocognitive disorder who presents to [facility], accompanied by her sister Jody [name], [phone]), for follow up and medication management.  Patient presents today encounter stating that she is still having issues with her sleep.  She describes her sleep is sporadic stating that on Sunday she gets no sleep while on other she receives on average 4 to 5 hours of sleep per night.  Provider informed patient that she could take an additional dose of ramelteon without issue for the management of her sleep.  Patient vocalized understanding.  Patient's medications to be e-prescribed to pharmacy of choice.  Provider to refill patient's medications so that she does not run out prior to her next encounter.  Patient's sister inquired about patient receiving an ACT team and how to set that up.  Provider to look into setting patient up with an ACT team.  Collaboration of Care: Collaboration of Care: Medication Management AEB provider managing patient's psychiatric medications, Primary Care Provider AEB patient being seen by a primary care provider at [facility], Psychiatrist AEB patient being seen by mental health provider at this facility, and Referral or follow-up with counselor/therapist AEB patient being seen by a licensed clinical social worker at this facility  Patient/Guardian was advised Release of Information must be obtained prior to any record release in order to collaborate their care with an outside provider. Patient/Guardian was advised if they have not already done so to contact the registration department to sign all necessary forms in order for [place] to release information regarding their care.   Consent: Patient/Guardian gives verbal consent for treatment and assignment of benefits for  services provided during this visit. Patient/Guardian expressed understanding and agreed to proceed.   1. Major neurocognitive disorder  - cloNIDine (CATAPRES) 0.1 MG tablet; Take 1 tablet (0.1 mg total) by mouth at bedtime.  Dispense: 90 tablet; Refill: 2 - FLUoxetine (PROZAC) 40 MG capsule; Take 1 capsule (40 mg total) by mouth daily.  Dispense: 90 capsule; Refill: 2  2. MDD (major depressive disorder), recurrent, severe, with psychosis  - mirtazapine (REMERON) 15 MG tablet; Take 1 tablet (15 mg total) by mouth at bedtime.  Dispense: 90 tablet; Refill: 2 - FLUoxetine (PROZAC) 40 MG capsule; Take 1 capsule (40 mg total) by mouth daily.  Dispense: 90 capsule; Refill: 2  3. Schizoaffective disorder, depressive type  - QUEtiapine (SEROQUEL) 300 MG tablet; Take 1 tablet (300 mg total) by mouth at bedtime.  Dispense: 30 tablet; Refill: 2 - Deutetrabenazine ER (AUSTEDO XR) 24 MG TB24; Take 24 mg by mouth daily.  Dispense: 30 tablet; Refill: 2  4. Sleep disturbances  - ramelteon (ROZEREM) 8 MG tablet; Take 1 tablet (8 mg total) by mouth at bedtime. Patient can take an additional dose (1 tablet, 8 mg total) if patient continues to have issues with sleep.  Dispense: 30 tablet; Refill: 2  5. Tardive dyskinesia  - Deutetrabenazine ER (AUSTEDO XR) 24 MG TB24; Take 24 mg by mouth daily.  Dispense: 30 tablet; Refill: 2  Patient to follow-up in 6 weeks Provider spent a total of 25 minutes with the patient/reviewing patient's chart  Meta Hatchet, PA 01/21/2023, 5:49 PM

## 2023-03-02 ENCOUNTER — Ambulatory Visit (INDEPENDENT_AMBULATORY_CARE_PROVIDER_SITE_OTHER): Payer: Medicare Other | Admitting: Physician Assistant

## 2023-03-02 DIAGNOSIS — F251 Schizoaffective disorder, depressive type: Secondary | ICD-10-CM

## 2023-03-02 DIAGNOSIS — G479 Sleep disorder, unspecified: Secondary | ICD-10-CM

## 2023-03-02 DIAGNOSIS — F0390 Unspecified dementia without behavioral disturbance: Secondary | ICD-10-CM

## 2023-03-02 DIAGNOSIS — G2401 Drug induced subacute dyskinesia: Secondary | ICD-10-CM

## 2023-03-02 DIAGNOSIS — F333 Major depressive disorder, recurrent, severe with psychotic symptoms: Secondary | ICD-10-CM

## 2023-03-05 MED ORDER — AUSTEDO XR 24 MG PO TB24
24.0000 mg | ORAL_TABLET | Freq: Every day | ORAL | 2 refills | Status: DC
Start: 2023-03-05 — End: 2023-04-11

## 2023-03-05 MED ORDER — QUETIAPINE FUMARATE 300 MG PO TABS: 300.0000 mg | ORAL_TABLET | Freq: Every day | ORAL | 2 refills | Status: DC

## 2023-03-05 MED ORDER — RAMELTEON 8 MG PO TABS: 8.0000 mg | ORAL_TABLET | Freq: Every day | ORAL | 2 refills | Status: DC

## 2023-03-05 MED ORDER — MIRTAZAPINE 15 MG PO TABS
15.0000 mg | ORAL_TABLET | Freq: Every day | ORAL | 2 refills | Status: DC
Start: 2023-03-05 — End: 2023-09-25

## 2023-03-05 MED ORDER — FLUOXETINE HCL 40 MG PO CAPS
40.0000 mg | ORAL_CAPSULE | Freq: Every day | ORAL | 2 refills | Status: DC
Start: 2023-03-05 — End: 2023-04-11

## 2023-03-05 NOTE — Progress Notes (Signed)
BH MD/PA/NP OP Progress Note  03/05/2023 1:51 PM Jody [name]  MRN:  [id number]  Chief Complaint:  Chief Complaint  Patient presents with   Follow-up   Medication Management   HPI:   Jody [name] is a 52 year old, African-American female with a past psychiatric history significant for schizoaffective disorder (depressed type), major depressive disorder, generalized anxiety disorder, sleep disturbances, and major neurocognitive disorder who presents to [facility], accompanied by her sister Jody [name], [phone]), for follow up and medication management. Patient is currently being managed on the following psychiatric medications:  Melatonin 10 mg at bedtime Seroquel 300 mg at bedtime Prozac 40 mg daily Mirtazapine 15 mg at bedtime Ramelteon 8 mg at bedtime Austedo XR 24 mg daily  Patient presents to the encounter stating that she has been struggling with her sleep even through the use of her mirtazapine and ramelteon.  Patient reports that there are some nights she is unable to sleep at all.  Patient endorses minimal depression stating that her depressive symptoms are not as present as they were before.  Patient denies experiencing suicidal thoughts and also endorses minimal hallucinations.  Per patient's sister, patient has shown vast improvement in her symptoms prior to being placed on her current medication regimen.  Patient denies depression at this time and endorses minimal anxiety she rates at 3 out of 10.  Provider informed patient that an attempt will be made to place patient on tasimelteon, a sleep medication.  Patient vocalized understanding.  A PHQ-9 screen was performed with the patient scoring an 18.  A GAD-7 screen was also performed with the patient scoring of 15.  Patient is alert and oriented x 4, calm, cooperative, and fully engaged in conversation during the encounter.  Patient endorses good mood.  Patient denies  suicidal or homicidal ideations.  She further denies auditory or visual hallucinations and does not appear to be responding to internal/external stimuli.  Patient endorses poor sleep and receives on average roughly 2 hours of sleep per night.  Patient reports that she will wake up periodically throughout the night.  Patient denies coffee before bedtime and states that she does not watch TV or is on her phone prior to bedtime.  Patient endorses good appetite and eats on average 4 meals per day.  Patient denies alcohol consumption, tobacco use, or illicit drug use.  Visit Diagnosis:    ICD-10-CM   1. Sleep disturbances  G47.9 ramelteon (ROZEREM) 8 MG tablet    2. Schizoaffective disorder, depressive type (HCC)  F25.1 QUEtiapine (SEROQUEL) 300 MG tablet    Deutetrabenazine ER (AUSTEDO XR) 24 MG TB24    3. Tardive dyskinesia  G24.01 Deutetrabenazine ER (AUSTEDO XR) 24 MG TB24    4. MDD (major depressive disorder), recurrent, severe, with psychosis (HCC)  F33.3 mirtazapine (REMERON) 15 MG tablet    FLUoxetine (PROZAC) 40 MG capsule    5. Major neurocognitive disorder (HCC)  F03.90 FLUoxetine (PROZAC) 40 MG capsule      Past Psychiatric History:  Schizophrenia - diagnosed roughly 7 years ago when patient was attending [employer] [facility] Patient was diagnosed with schizoaffective disorder (depressive type) when admitted to [facility] from 04/08/2022 - 04/12/2022.  In addition to being diagnosed with schizoaffective disorder, patient was treated for insomnia, generalized anxiety disorder, and tardive dyskinesia.   **Onset of dementia, major neurocognitive disorder, due to Alzheimer's disease, without behavioral disturbance, mild  Past Medical History:  Past Medical History:  Diagnosis Date  Anxiety and depression    Bipolar disorder (HCC)    Cystitis, interstitial    Elevated troponin    a. 09/2015: CP/elevated troponin up to 0.11 - unclear significance. CT neg for PE, LHC  neg for CAD, normal echo.   Hypertension    Microcytic anemia    Noted on labs   Panic attack    Paranoid schizophrenia (HCC)    Tobacco abuse     Past Surgical History:  Procedure Laterality Date   CARDIAC CATHETERIZATION N/A 09/29/2015   Procedure: Left Heart Cath and Coronary Angiography;  Surgeon: Corky Crafts, MD;  Location: [facility] INVASIVE CV LAB;  Service: Cardiovascular;  Laterality: N/A;   TUBAL LIGATION      Family Psychiatric History:  Grandmother (maternal) - unsure of the diagnosis but states that her grandmother had mental health issues Uncle (maternal) - Schizophrenia and bipolar disorder, he is currently taking medications but is unsure of the type.   Family history of suicide: Patient denies Family history of homicide: Patient denies Family history of substance abuse: Per patient's sister, alcohol abuse runs in the family  Family History:  Family History  Problem Relation Age of Onset   Diabetes Father    Hypertension Mother    Hypertension Sister    Lupus Sister    Heart disease Maternal Grandfather    Lupus Other     Social History:  Social History   Socioeconomic History   Marital status: Single    Spouse name: Not on file   Number of children: 5   Years of education: 12   Highest education level: Not on file  Occupational History   Occupation: Disabled  Tobacco Use   Smoking status: Former    Packs/day: 0.25    Years: 4.00    Additional pack years: 0.00    Total pack years: 1.00    Types: Cigarettes    Quit date: 05/01/2016    Years since quitting: 6.8   Smokeless tobacco: Never  Vaping Use   Vaping Use: Never used  Substance and Sexual Activity   Alcohol use: Not Currently    Alcohol/week: 0.0 standard drinks of alcohol    Comment: Quit 2017   Drug use: No   Sexual activity: Not Currently    Birth control/protection: Condom  Other Topics Concern   Not on file  Social History Narrative   Lives alone   Caffeine use: Soda- daily    Right-handed   Social Determinants of Health   Financial Resource Strain: Low Risk  (12/22/2022)   Overall Financial Resource Strain (CARDIA)    Difficulty of Paying Living Expenses: Not hard at all  Food Insecurity: No Food Insecurity (12/22/2022)   Hunger Vital Sign    Worried About Running Out of Food in the Last Year: Never true    Ran Out of Food in the Last Year: Never true  Transportation Needs: No Transportation Needs (12/22/2022)   PRAPARE - Administrator, Civil Service (Medical): No    Lack of Transportation (Non-Medical): No  Physical Activity: Sufficiently Active (12/22/2022)   Exercise Vital Sign    Days of Exercise per Week: 3 days    Minutes of Exercise per Session: 90 min  Stress: No Stress Concern Present (12/22/2022)   [employer] of Occupational Health - Occupational Stress Questionnaire    Feeling of Stress : Only a little  Social Connections: Not on file    Allergies:  Allergies  Allergen Reactions   Penicillins  Itching and Rash    Metabolic Disorder Labs: Lab Results  Component Value Date   HGBA1C 5.9 (H) 10/10/2022   MPG 114.02 04/07/2022   MPG 117 09/28/2015   Lab Results  Component Value Date   PROLACTIN 9.7 12/01/2022   PROLACTIN 17.2 04/07/2022   Lab Results  Component Value Date   CHOL 269 (H) 10/10/2022   TRIG 186 (H) 10/10/2022   HDL 43 10/10/2022   CHOLHDL 6.3 (H) 10/10/2022   VLDL 56 (H) 04/09/2022   LDLCALC 191 (H) 10/10/2022   LDLCALC 147 (H) 04/09/2022   Lab Results  Component Value Date   TSH 5.855 (H) 04/07/2022   TSH 1.560 06/28/2016    Therapeutic Level Labs: No results found for: "LITHIUM" Lab Results  Component Value Date   VALPROATE 118 (H) 04/26/2016   VALPROATE 136 (H) 04/26/2016   No results found for: "CBMZ"  Current Medications: Current Outpatient Medications  Medication Sig Dispense Refill   atorvastatin (LIPITOR) 10 MG tablet Take 1 tablet (10 mg total) by mouth daily. 90 tablet 3    cloNIDine (CATAPRES) 0.1 MG tablet Take 1 tablet (0.1 mg total) by mouth at bedtime. 90 tablet 2   Deutetrabenazine ER (AUSTEDO XR) 24 MG TB24 Take 24 mg by mouth daily. 30 tablet 2   donepezil (ARICEPT) 5 MG tablet Take 1 tablet (5 mg total) by mouth at bedtime. 90 tablet 1   ferrous sulfate (FEROSUL) 325 (65 FE) MG tablet Take 1 tablet (325 mg total) by mouth daily with breakfast. 90 tablet 3   FLUoxetine (PROZAC) 40 MG capsule Take 1 capsule (40 mg total) by mouth daily. 90 capsule 2   gabapentin (NEURONTIN) 100 MG capsule Take 1 capsule (100 mg total) by mouth 2 (two) times daily. Once in the am and once in the pm 180 capsule 1   hydrochlorothiazide (HYDRODIURIL) 25 MG tablet Take 1 tablet (25 mg total) by mouth daily. 90 tablet 1   hydrOXYzine (ATARAX) 50 MG tablet Take 1 tablet (50 mg total) by mouth every 8 (eight) hours as needed for anxiety. 42 tablet 0   hydrOXYzine (ATARAX) 50 MG tablet Take 1 tablet (50 mg total) by mouth every 8 (eight) hours as needed for anxiety. 42 tablet 0   mirtazapine (REMERON) 15 MG tablet Take 1 tablet (15 mg total) by mouth at bedtime. 90 tablet 2   QUEtiapine (SEROQUEL) 300 MG tablet Take 1 tablet (300 mg total) by mouth at bedtime. 30 tablet 2   ramelteon (ROZEREM) 8 MG tablet Take 1 tablet (8 mg total) by mouth at bedtime. Patient can take an additional dose (1 tablet, 8 mg total) if patient continues to have issues with sleep. 30 tablet 2   No current facility-administered medications for this visit.     Musculoskeletal: Strength & Muscle Tone: within normal limits Gait & Station: normal Patient leans: N/A  Psychiatric Specialty Exam: Review of Systems  Psychiatric/Behavioral:  Positive for sleep disturbance. Negative for decreased concentration, dysphoric mood, hallucinations, self-injury and suicidal ideas. The patient is nervous/anxious. The patient is not hyperactive.     There were no vitals taken for this visit.There is no height or weight  on file to calculate BMI.  General Appearance: Casual  Eye Contact:  Good  Speech:  Clear and Coherent and Normal Rate  Volume:  Normal  Mood:  Euthymic  Affect:  Appropriate  Thought Process:  Coherent, Goal Directed, and Descriptions of Associations: Intact  Orientation:  Full (Time, Place, and  Person)  Thought Content: WDL and Paranoid Ideation   Suicidal Thoughts:  No  Homicidal Thoughts:  No  Memory:  Immediate;   Good Recent;   Fair Remote;   Fair  Judgement:  Good  Insight:  Good  Psychomotor Activity:  TD  Concentration:  Concentration: Good and Attention Span: Good  Recall:  Good  Fund of Knowledge: Fair  Language: Good  Akathisia:  No  Handed:  Right  AIMS (if indicated): done  Assets:  Communication Skills Desire for Improvement Housing Social Support  ADL's:  Intact  Cognition: Impaired,  Mild  Sleep:  Poor   Screenings: Geneticist, molecular Office Visit from 12/08/2022 in [facility] Clinical Support from 11/21/2022 in [facility] Support from 11/07/2022 in [facility] Visit from 10/27/2022 in [facility]  AIMS Total Score 8 28 24 17 4       AUDIT    Flowsheet Row [facility]) [facility] Ad[facility] (Dis[facility] Ad[facility] (Discharged) [facility]2014 [facility]  Alcohol Use Disorder Identification Test Final Score (AUDIT) 9 4 20       GAD-7    Flowsheet Row Clinical Support from 03/02/2023 in [facility] Clinical Support from 01/19/2023 in [facility] Office Visit from 12/08/2022 in [facility] Clinical  Support from 11/07/2022 in [facility] Office Visit from 10/27/2022 in [facility]  Total GAD-7 Score 15 17 15 21 21       Mini-Mental    Flowsheet Row Office Visit from 01/31/2017 in [facility] Office Visit from 11/27/2016 in [facility] Office Visit from 06/28/2016 in [facility] Office Visit from 05/25/2016 in [facility] Office Visit from 03/11/2015 in [facility]  Total Score (max 30 points ) 25 11 18 15 26       PHQ2-9    Flowsheet Row Clinical Support from 03/02/2023 in [facility] Clinical Support from 01/19/2023 in [facility] Office Visit from 01/02/2023 in [facility] Clinical Support from 12/22/2022 in [facility] Office Visit from 12/08/2022 in [facility]  PHQ-2 Total Score 3 4 1  0 2  PHQ-9 Total Score 18 19 14  -- 13      Flowsheet Row Clinical Support from 03/02/2023 in [facility] Clinical Support from 01/19/2023 in [facility] Office Visit from 12/08/2022 in [facility]  C-SSRS RISK CATEGORY Low Risk Low Risk Low Risk        Assessment and Plan:   Jody [name] is a 52 year old, African-American female with a past psychiatric history significant for schizoaffective disorder (depressed type), major depressive disorder, generalized anxiety disorder, sleep disturbances, and major neurocognitive disorder who presents to [facility], accompanied by her sister Jody [name], [phone]), for follow up and medication management.  Patient reports that she has been struggling with sleep since the last encounter.   She reports that she receives on average 2 hours of sleep per night.  On occasion, she reports that she receives barely any sleep at all.  Despite her issues with sleep, patient endorses minimal depression as well as minimal anxiety.  In regards to medications she has utilized in the past for her sleep, patient's sister states that Ambien was not an effective medication and caused the patient's onset dementia.  Provider informed patient that efforts would be made to place patient on tasimelteon, a sleep aid medication.  Patient vocalized understanding.  Patient to continue taking all other medications as prescribed.  Patient's medication to use e-prescribed to pharmacy choice.  Collaboration of Care: Collaboration of Care: Medication Management AEB provider managing patient's psychiatric medications, Primary Care Provider AEB patient being seen by a primary care provider at [facility], Psychiatrist AEB patient being seen by mental health provider at this facility, and Referral or follow-up with counselor/therapist AEB patient being seen by a licensed clinical social worker at this facility  Patient/Guardian was advised Release of Information must be obtained prior to any record release in order to collaborate their care with an outside provider. Patient/Guardian was advised if they have not already done so to contact the registration department to sign all necessary forms in order for [place] to release information regarding their care.   Consent: Patient/Guardian gives verbal consent for treatment and assignment of benefits for services provided during this visit. Patient/Guardian expressed understanding and agreed to proceed.   1. Sleep disturbances  - ramelteon (ROZEREM) 8 MG tablet; Take 1 tablet (8 mg total) by mouth at bedtime. Patient can take an additional dose (1 tablet, 8 mg total) if patient continues to have issues with sleep.  Dispense: 30 tablet; Refill: 2  2.  Schizoaffective disorder, depressive type (HCC)  - QUEtiapine (SEROQUEL) 300 MG tablet; Take 1 tablet (300 mg total) by mouth at bedtime.  Dispense: 30 tablet; Refill: 2 - Deutetrabenazine ER (AUSTEDO XR) 24 MG TB24; Take 24 mg by mouth daily.  Dispense: 30 tablet; Refill: 2  3. Tardive dyskinesia  - Deutetrabenazine ER (AUSTEDO XR) 24 MG TB24; Take 24 mg by mouth daily.  Dispense: 30 tablet; Refill: 2  4. MDD (major depressive disorder), recurrent, severe, with psychosis (HCC)  - mirtazapine (REMERON) 15 MG tablet; Take 1 tablet (15 mg total) by mouth at bedtime.  Dispense: 90 tablet; Refill: 2 - FLUoxetine (PROZAC) 40 MG capsule; Take 1 capsule (40 mg total) by mouth daily.  Dispense: 90 capsule; Refill: 2  5. Major neurocognitive disorder (HCC)  - FLUoxetine (PROZAC) 40 MG capsule; Take 1 capsule (40 mg total) by mouth daily.  Dispense: 90 capsule; Refill: 2  Patient to follow-up in 6 weeks Provider spent a total of 22 minutes with the patient/reviewing patient's chart  Meta Hatchet, PA 03/05/2023, 1:51 PM

## 2023-03-20 ENCOUNTER — Telehealth (HOSPITAL_COMMUNITY): Payer: Self-pay | Admitting: *Deleted

## 2023-03-20 NOTE — Telephone Encounter (Signed)
Patients sister called asking what was prescribed for sleep since the Rozerem has not been working. Message sent to MD for discussion.

## 2023-04-04 ENCOUNTER — Other Ambulatory Visit (HOSPITAL_COMMUNITY): Payer: Self-pay | Admitting: Physician Assistant

## 2023-04-04 DIAGNOSIS — G479 Sleep disorder, unspecified: Secondary | ICD-10-CM

## 2023-04-04 MED ORDER — BELSOMRA 5 MG PO TABS
5.0000 mg | ORAL_TABLET | Freq: Every evening | ORAL | 0 refills | Status: DC | PRN
Start: 2023-04-04 — End: 2023-05-03

## 2023-04-04 NOTE — Progress Notes (Signed)
Patient is currently still struggling with sleep even through the use of mirtazapine and ramelteon.  In the past, patient has utilized trazodone without any success in the management of her sleep.  Patient has also been on Ambien; however, patient has a history of early onset dementia.  Provider to place patient on Belsomra for the management of her sleep.  Patient's next follow-up appointment is scheduled for 04/11/2023.

## 2023-04-04 NOTE — Telephone Encounter (Signed)
Message acknowledged and reviewed.

## 2023-04-11 ENCOUNTER — Ambulatory Visit (INDEPENDENT_AMBULATORY_CARE_PROVIDER_SITE_OTHER): Payer: Medicare Other | Admitting: Physician Assistant

## 2023-04-11 VITALS — BP 124/81 | HR 82 | Temp 98.9°F | Ht 64.0 in | Wt 190.2 lb

## 2023-04-11 DIAGNOSIS — G2401 Drug induced subacute dyskinesia: Secondary | ICD-10-CM

## 2023-04-11 DIAGNOSIS — F333 Major depressive disorder, recurrent, severe with psychotic symptoms: Secondary | ICD-10-CM

## 2023-04-11 DIAGNOSIS — F251 Schizoaffective disorder, depressive type: Secondary | ICD-10-CM

## 2023-04-11 DIAGNOSIS — G479 Sleep disorder, unspecified: Secondary | ICD-10-CM | POA: Diagnosis not present

## 2023-04-11 DIAGNOSIS — F0390 Unspecified dementia without behavioral disturbance: Secondary | ICD-10-CM | POA: Diagnosis not present

## 2023-04-11 MED ORDER — QUETIAPINE FUMARATE 400 MG PO TABS: 400.0000 mg | ORAL_TABLET | Freq: Every day | ORAL | 2 refills | Status: DC

## 2023-04-11 MED ORDER — FLUOXETINE HCL 40 MG PO CAPS
40.0000 mg | ORAL_CAPSULE | Freq: Every day | ORAL | 2 refills | Status: DC
Start: 2023-04-11 — End: 2023-05-23

## 2023-04-11 MED ORDER — AUSTEDO XR 24 MG PO TB24: 24.0000 mg | ORAL_TABLET | Freq: Every day | ORAL | 2 refills | Status: DC

## 2023-04-12 ENCOUNTER — Encounter (HOSPITAL_COMMUNITY): Payer: Self-pay | Admitting: Physician Assistant

## 2023-04-12 NOTE — Progress Notes (Signed)
BH MD/PA/NP OP Progress Note  04/12/2023 8:56 PM Jody [name]  MRN:  [id number]  Chief Complaint:  Chief Complaint  Patient presents with   Follow-up   Medication Management   HPI:   Jody [name] is a 52 year old, African-American female with a past psychiatric history significant for schizoaffective disorder (depressed type), major depressive disorder, generalized anxiety disorder, sleep disturbances, and major neurocognitive disorder who presents to [facility], accompanied by [name], [phone]), for follow up and medication management. Patient is currently being managed on the following psychiatric medications:  Seroquel 300 mg at bedtime Prozac 40 mg daily Mirtazapine 15 mg at bedtime Ramelteon 8 mg at bedtime Austedo XR 24 mg daily  Patient reports that she has been well and denies experiencing depressive symptoms.  She believes that her schizophrenia symptoms have been acting up and states that she has days where she feels like she is going to die.  Patient denies any triggers to these feelings of death and states that they does come over her randomly.  Patient also endorses paranoia feeling as if people are out to get her.  Patient denies auditory or visual hallucinations at this time, but does state that she feels people can read her thoughts.  Patient reports that she has been experiencing these symptoms since last week.  Patient endorses anxiety attributed to paranoia over the belief that people are out to get her.  Patient denies any new stressors at this time.  Per patient's sister, she has noticed more leg movements and her sister.  Provider discussed with patient about placing her on Belsomra for the management of her sleep.  Patient reports that she has not tried the medication yet but will be using the medication later tonight.  A PHQ-9 screen was performed with the patient scoring a 20.  A GAD-7 screen was  also performed with the patient scoring a 19.  Patient is alert and oriented x 4, calm, cooperative, and fully engaged in conversation during the encounter.  Patient endorses fair mood.  Patient denies suicidal or homicidal ideations.  She further denies auditory or visual hallucinations and does not appear to be responding to internal/external stimuli.  Patient endorses poor sleep and receives on average 2 hours of sleep at a time.  Patient endorses good appetite and eats on average 4 meals per day.  Patient denies alcohol consumption, tobacco use, and illicit drug use.  Visit Diagnosis:    ICD-10-CM   1. Schizoaffective disorder, depressive type (HCC)  F25.1 QUEtiapine (SEROQUEL) 400 MG tablet    Deutetrabenazine ER (AUSTEDO XR) 24 MG TB24    2. Tardive dyskinesia  G24.01 Deutetrabenazine ER (AUSTEDO XR) 24 MG TB24    3. MDD (major depressive disorder), recurrent, severe, with psychosis (HCC)  F33.3 FLUoxetine (PROZAC) 40 MG capsule    4. Major neurocognitive disorder (HCC)  F03.90 FLUoxetine (PROZAC) 40 MG capsule      Past Psychiatric History:  Schizophrenia - diagnosed roughly 7 years ago when patient was attending [facility] Patient was diagnosed with schizoaffective disorder (depressive type) when admitted to [facility] from 04/08/2022 - 04/12/2022.  In addition to being diagnosed with schizoaffective disorder, patient was treated for insomnia, generalized anxiety disorder, and tardive dyskinesia.   **Onset of dementia, major neurocognitive disorder, due to Alzheimer's disease, without behavioral disturbance, mild  Past Medical History:  Past Medical History:  Diagnosis Date   Anxiety and depression    Bipolar disorder (  HCC)    Cystitis, interstitial    Elevated troponin    a. 09/2015: CP/elevated troponin up to 0.11 - unclear significance. CT neg for PE, LHC neg for CAD, normal echo.   Hypertension    Microcytic anemia    Noted on labs   Panic  attack    Paranoid schizophrenia (HCC)    Tobacco abuse     Past Surgical History:  Procedure Laterality Date   CARDIAC CATHETERIZATION N/A 09/29/2015   Procedure: Left Heart Cath and Coronary Angiography;  Surgeon: Corky Crafts, MD;  Location: [facility] INVASIVE CV LAB;  Service: Cardiovascular;  Laterality: N/A;   TUBAL LIGATION      Family Psychiatric History:  Grandmother (maternal) - unsure of the diagnosis but states that her grandmother had mental health issues Uncle (maternal) - Schizophrenia and bipolar disorder, he is currently taking medications but is unsure of the type.   Family history of suicide: Patient denies Family history of homicide: Patient denies Family history of substance abuse: Per patient's sister, alcohol abuse runs in the family  Family History:  Family History  Problem Relation Age of Onset   Diabetes Father    Hypertension Mother    Hypertension Sister    Lupus Sister    Heart disease Maternal Grandfather    Lupus Other     Social History:  Social History   Socioeconomic History   Marital status: Single    Spouse name: Not on file   Number of children: 5   Years of education: 12   Highest education level: Not on file  Occupational History   Occupation: Disabled  Tobacco Use   Smoking status: Former    Current packs/day: 0.00    Average packs/day: 0.3 packs/day for 4.0 years (1.0 ttl pk-yrs)    Types: Cigarettes    Start date: 05/01/2012    Quit date: 05/01/2016    Years since quitting: 6.9   Smokeless tobacco: Never  Vaping Use   Vaping status: Never Used  Substance and Sexual Activity   Alcohol use: Not Currently    Alcohol/week: 0.0 standard drinks of alcohol    Comment: Quit 2017   Drug use: No   Sexual activity: Not Currently    Birth control/protection: Condom  Other Topics Concern   Not on file  Social History Narrative   Lives alone   Caffeine use: Soda- daily   Right-handed   Social Determinants of Health    Financial Resource Strain: Low Risk  (12/22/2022)   Overall Financial Resource Strain (CARDIA)    Difficulty of Paying Living Expenses: Not hard at all  Food Insecurity: No Food Insecurity (12/22/2022)   Hunger Vital Sign    Worried About Running Out of Food in the Last Year: Never true    Ran Out of Food in the Last Year: Never true  Transportation Needs: No Transportation Needs (12/22/2022)   PRAPARE - Administrator, Civil Service (Medical): No    Lack of Transportation (Non-Medical): No  Physical Activity: Sufficiently Active (12/22/2022)   Exercise Vital Sign    Days of Exercise per Week: 3 days    Minutes of Exercise per Session: 90 min  Stress: No Stress Concern Present (12/22/2022)   [employer] of Occupational Health - Occupational Stress Questionnaire    Feeling of Stress : Only a little  Social Connections: Not on file    Allergies:  Allergies  Allergen Reactions   Penicillins Itching and Rash    Metabolic  Disorder Labs: Lab Results  Component Value Date   HGBA1C 5.9 (H) 10/10/2022   MPG 114.02 04/07/2022   MPG 117 09/28/2015   Lab Results  Component Value Date   PROLACTIN 9.7 12/01/2022   PROLACTIN 17.2 04/07/2022   Lab Results  Component Value Date   CHOL 269 (H) 10/10/2022   TRIG 186 (H) 10/10/2022   HDL 43 10/10/2022   CHOLHDL 6.3 (H) 10/10/2022   VLDL 56 (H) 04/09/2022   LDLCALC 191 (H) 10/10/2022   LDLCALC 147 (H) 04/09/2022   Lab Results  Component Value Date   TSH 5.855 (H) 04/07/2022   TSH 1.560 06/28/2016    Therapeutic Level Labs: No results found for: "LITHIUM" Lab Results  Component Value Date   VALPROATE 118 (H) 04/26/2016   VALPROATE 136 (H) 04/26/2016   No results found for: "CBMZ"  Current Medications: Current Outpatient Medications  Medication Sig Dispense Refill   atorvastatin (LIPITOR) 10 MG tablet Take 1 tablet (10 mg total) by mouth daily. 90 tablet 3   cloNIDine (CATAPRES) 0.1 MG tablet Take 1 tablet  (0.1 mg total) by mouth at bedtime. 90 tablet 2   Deutetrabenazine ER (AUSTEDO XR) 24 MG TB24 Take 24 mg by mouth daily. 30 tablet 2   donepezil (ARICEPT) 5 MG tablet Take 1 tablet (5 mg total) by mouth at bedtime. 90 tablet 1   ferrous sulfate (FEROSUL) 325 (65 FE) MG tablet Take 1 tablet (325 mg total) by mouth daily with breakfast. 90 tablet 3   FLUoxetine (PROZAC) 40 MG capsule Take 1 capsule (40 mg total) by mouth daily. 90 capsule 2   gabapentin (NEURONTIN) 100 MG capsule Take 1 capsule (100 mg total) by mouth 2 (two) times daily. Once in the am and once in the pm 180 capsule 1   hydrochlorothiazide (HYDRODIURIL) 25 MG tablet Take 1 tablet (25 mg total) by mouth daily. 90 tablet 1   hydrOXYzine (ATARAX) 50 MG tablet Take 1 tablet (50 mg total) by mouth every 8 (eight) hours as needed for anxiety. 42 tablet 0   hydrOXYzine (ATARAX) 50 MG tablet Take 1 tablet (50 mg total) by mouth every 8 (eight) hours as needed for anxiety. 42 tablet 0   mirtazapine (REMERON) 15 MG tablet Take 1 tablet (15 mg total) by mouth at bedtime. 90 tablet 2   QUEtiapine (SEROQUEL) 400 MG tablet Take 1 tablet (400 mg total) by mouth at bedtime. 30 tablet 2   ramelteon (ROZEREM) 8 MG tablet Take 1 tablet (8 mg total) by mouth at bedtime. Patient can take an additional dose (1 tablet, 8 mg total) if patient continues to have issues with sleep. 30 tablet 2   Suvorexant (BELSOMRA) 5 MG TABS Take 1 tablet (5 mg total) by mouth at bedtime as needed. 30 tablet 0   No current facility-administered medications for this visit.     Musculoskeletal: Strength & Muscle Tone: within normal limits Gait & Station: normal Patient leans: N/A  Psychiatric Specialty Exam: Review of Systems  Psychiatric/Behavioral:  Positive for sleep disturbance. Negative for decreased concentration, dysphoric mood, hallucinations, self-injury and suicidal ideas. The patient is nervous/anxious. The patient is not hyperactive.     Blood pressure  124/81, pulse 82, temperature 98.9 F (37.2 C), temperature source Oral, height 5\' 4"  (1.626 m), weight 190 lb 3.2 oz (86.3 kg), SpO2 99%.Body mass index is 32.65 kg/m.  General Appearance: Casual  Eye Contact:  Good  Speech:  Clear and Coherent and Normal Rate  Volume:  Normal  Mood:  Euthymic  Affect:  Appropriate  Thought Process:  Coherent, Goal Directed, and Descriptions of Associations: Intact  Orientation:  Full (Time, Place, and Person)  Thought Content: WDL and Paranoid Ideation   Suicidal Thoughts:  No  Homicidal Thoughts:  No  Memory:  Immediate;   Good Recent;   Fair Remote;   Fair  Judgement:  Good  Insight:  Good  Psychomotor Activity:  TD  Concentration:  Concentration: Good and Attention Span: Good  Recall:  Good  Fund of Knowledge: Fair  Language: Good  Akathisia:  No  Handed:  Right  AIMS (if indicated): done  Assets:  Communication Skills Desire for Improvement Housing Social Support  ADL's:  Intact  Cognition: Impaired,  Mild  Sleep:  Poor   Screenings: Geneticist, molecular Office Visit from 12/08/2022 in [facility] Clinical Support from 11/21/2022 in [facility] Clinical Support from 11/07/2022 in [facility] Office Visit from 10/27/2022 in [facility]) [facility]  AIMS Total Score 8 28 24 17 4       AUDIT    Flowsheet Row [facility]) [facility] ([facility] Ad[facility] (Discharged[facility]  Alcohol Use Disorder Identification Test Final Score (AUDIT) 9 4 20       GAD-7    Flowsheet Row Clinical Support from 04/11/2023 in [facility] Clinical  Support from 03/02/2023 in [facility] Clinical Support from 01/19/2023 in [facility] Office Visit from 12/08/2022 in [facility] Clinical Support from 11/07/2022 in [facility]  Total GAD-7 Score 19 15 17 15 21       Mini-Mental    Flowsheet Row Office Visit from 01/31/2017 in [facility] Office Visit from 11/27/2016 in [facility] Office Visit from 06/28/2016 in [facility] Office Visit from 05[facility]25/2016 in [facility] Office Visit from 03/11/2015 in [facility]  Total Score (max 30 points ) 25 11 18 15 26       PHQ2-9    Flowsheet Row Clinical Support from 04/11/2023 in [facility] Clinical Support from 03/02/2023 in [facility] Clinical Support from 01/19/2023 in [facility] Office Visit from 01/02/2023 in [facility] Clinical Support from 12/22/2022 in [facility]  PHQ-2 Total Score 4 3 4 1  0  PHQ-9 Total Score 20 18 19 14  --      Flowsheet Row Clinical Support from 04/11/2023 in [facility] Clinical Support from 03/02/2023 in [facility] Clinical Support from 01/19/2023 in [facility]  C-SSRS RISK CATEGORY Low Risk Low Risk Low Risk        Assessment and Plan:   Jody [name] is a 52 year old, African-American female with a past psychiatric history significant for schizoaffective disorder (depressed type), major depressive disorder, generalized anxiety disorder, sleep disturbances, and major neurocognitive disorder who presents to [facility], accompanied by her  sister [name], [phone]), for follow up and medication management.  Although patient scored a 20 on her PHQ-9 screen, she denies experiencing depressive symptoms at this time.  She does endorse a flareup of her schizophrenia symptoms stating that she has been having feelings that she may die.  Patient denies any specific triggers to these feelings and states that they just come over her randomly.  Patient also endorses paranoia characterized by people being able to read her thoughts and people out to get her.  Patient denies auditory or visual hallucinations at this time.  She does endorse anxiety related to her paranoia.  Provider recommended increasing patient's Seroquel from 300 mg to 400 mg at bedtime for the management of her symptoms of schizophrenia and for mood stability.  Patient was agreeable to recommendation.  Patient continues to have trouble with sleeping.  She reports that she received her Belsomra medication and plans on taking the medication at night to help her with her sleep.  Provider to remove the use of ramelteon from her medication regimen due to using Belsomra.  Provider informed patient that if her Belsomra is helpful in managing her sleep, then mirtazapine will also be removed from her medication regimen.  Patient vocalized understanding.  Patient's sister reports that she has noticed some leg movements within the patient; however, the patient reports no major issues or concerns regarding her leg tremors.  Patient is still taking Austedo for the management of her tardive dyskinesia.  Patient's medications to be e-prescribed to pharmacy of choice.  Collaboration of Care: Collaboration of Care: Medication Management AEB provider managing patient's psychiatric medications, Primary Care Provider AEB patient being seen by a primary care provider at [facility], Psychiatrist AEB patient being seen by mental health provider at this facility, and Referral  or follow-up with counselor/therapist AEB patient being seen by a licensed clinical social worker at this facility  Patient/Guardian was advised Release of Information must be obtained prior to any record release in order to collaborate their care with an outside provider. Patient/Guardian was advised if they have not already done so to contact the registration department to sign all necessary forms in order for [place] to release information regarding their care.   Consent: Patient/Guardian gives verbal consent for treatment and assignment of benefits for services provided during this visit. Patient/Guardian expressed understanding and agreed to proceed.   1. Schizoaffective disorder, depressive type (HCC)  - QUEtiapine (SEROQUEL) 400 MG tablet; Take 1 tablet (400 mg total) by mouth at bedtime.  Dispense: 30 tablet; Refill: 2 - Deutetrabenazine ER (AUSTEDO XR) 24 MG TB24; Take 24 mg by mouth daily.  Dispense: 30 tablet; Refill: 2  2. Tardive dyskinesia  - Deutetrabenazine ER (AUSTEDO XR) 24 MG TB24; Take 24 mg by mouth daily.  Dispense: 30 tablet; Refill: 2  3. MDD (major depressive disorder), recurrent, severe, with psychosis (HCC)  - FLUoxetine (PROZAC) 40 MG capsule; Take 1 capsule (40 mg total) by mouth daily.  Dispense: 90 capsule; Refill: 2  4. Major neurocognitive disorder (HCC)  - FLUoxetine (PROZAC) 40 MG capsule; Take 1 capsule (40 mg total) by mouth daily.  Dispense: 90 capsule; Refill: 2  5. Sleep disturbances Patient to start Belsomra 5 mg at bedtime as needed for the management of her sleep disturbances  Patient to follow-up in 6 weeks Provider spent a total of 23 minutes with the patient/reviewing patient's chart  Meta Hatchet, PA 04/12/2023, 8:56 PM

## 2023-04-13 ENCOUNTER — Encounter (HOSPITAL_COMMUNITY): Payer: Medicare Other | Admitting: Physician Assistant

## 2023-05-02 ENCOUNTER — Other Ambulatory Visit: Payer: Self-pay | Admitting: Nurse Practitioner

## 2023-05-02 ENCOUNTER — Other Ambulatory Visit (HOSPITAL_COMMUNITY): Payer: Self-pay | Admitting: Physician Assistant

## 2023-05-02 DIAGNOSIS — F0390 Unspecified dementia without behavioral disturbance: Secondary | ICD-10-CM

## 2023-05-02 DIAGNOSIS — G479 Sleep disorder, unspecified: Secondary | ICD-10-CM

## 2023-05-04 ENCOUNTER — Encounter: Payer: Self-pay | Admitting: Nurse Practitioner

## 2023-05-04 ENCOUNTER — Ambulatory Visit: Payer: Medicare Other | Attending: Nurse Practitioner | Admitting: Nurse Practitioner

## 2023-05-04 VITALS — BP 130/80 | HR 77 | Ht 64.0 in | Wt 196.6 lb

## 2023-05-04 DIAGNOSIS — F319 Bipolar disorder, unspecified: Secondary | ICD-10-CM | POA: Diagnosis not present

## 2023-05-04 DIAGNOSIS — Z23 Encounter for immunization: Secondary | ICD-10-CM

## 2023-05-04 DIAGNOSIS — Z79899 Other long term (current) drug therapy: Secondary | ICD-10-CM | POA: Insufficient documentation

## 2023-05-04 DIAGNOSIS — E785 Hyperlipidemia, unspecified: Secondary | ICD-10-CM

## 2023-05-04 DIAGNOSIS — R7303 Prediabetes: Secondary | ICD-10-CM | POA: Diagnosis not present

## 2023-05-04 DIAGNOSIS — I10 Essential (primary) hypertension: Secondary | ICD-10-CM

## 2023-05-04 DIAGNOSIS — F200 Paranoid schizophrenia: Secondary | ICD-10-CM | POA: Diagnosis not present

## 2023-05-04 DIAGNOSIS — D509 Iron deficiency anemia, unspecified: Secondary | ICD-10-CM | POA: Diagnosis not present

## 2023-05-04 DIAGNOSIS — Z760 Encounter for issue of repeat prescription: Secondary | ICD-10-CM | POA: Insufficient documentation

## 2023-05-04 DIAGNOSIS — D508 Other iron deficiency anemias: Secondary | ICD-10-CM | POA: Diagnosis not present

## 2023-05-04 LAB — POCT GLYCOSYLATED HEMOGLOBIN (HGB A1C): Hemoglobin A1C: 5.9 % — AB (ref 4.0–5.6)

## 2023-05-04 MED ORDER — ZOSTER VAC RECOMB ADJUVANTED 50 MCG/0.5ML IM SUSR
0.5000 mL | Freq: Once | INTRAMUSCULAR | 0 refills | Status: AC
Start: 2023-05-04 — End: 2023-05-04

## 2023-05-04 NOTE — Progress Notes (Signed)
Assessment & Plan:  Jody [name] was seen today for prediabetes.  Diagnoses and all orders for this visit:  Prediabetes -     POCT glycosylated hemoglobin (Hb A1C)  Primary hypertension Continue all antihypertensives as prescribed.  Reminded to bring in blood pressure log for follow  up appointment.  RECOMMENDATIONS: DASH/Mediterranean Diets are healthier choices for HTN.    Need for shingles vaccine -     Zoster Vaccine Adjuvanted [facility]) injection; Inject 0.5 mLs into the muscle once for 1 dose.  Other iron deficiency anemia -     CBC with Differential/Platelet  Dyslipidemia, goal LDL below 70 -     Lipid panel INSTRUCTIONS: Work on a low fat, heart healthy diet and participate in regular aerobic exercise program by working out at least 150 minutes per week; 5 days a week-30 minutes per day. Avoid red meat/beef/steak,  fried foods. junk foods, sodas, sugary drinks, unhealthy snacking, alcohol and smoking.  Drink at least 80 oz of water per day and monitor your carbohydrate intake daily.     Patient has been counseled on age-appropriate routine health concerns for screening and prevention. These are reviewed and up-to-date. Referrals have been placed accordingly. Immunizations are up-to-date or declined.    Subjective:   Chief Complaint  Patient presents with   Prediabetes   HPI Jody [name] 52 y.o. female presents to office today for follow up to prediabetes and HTN  She has no questions or concerns today.  Requesting shingles vaccine which was sent to [facility]  She has a history of bipolar disorder, panic attacks and paranoid schizophrenia. She is followed by psychiatry for her mental health.  I have made courtesy refills on several of her medications today.   Patient has been counseled on age-appropriate routine health concerns for screening and prevention. These are reviewed and up-to-date. Referrals have been placed accordingly. Immunizations are up-to-date or declined.      Mammogram: Overdue.  Referral placed Pap smear: Up-to-date  Colonoscopy: Overdue.  She believes she had a colonoscopy within the past 10 years and we are awaiting those records however she has been referred to GI for scheduling.    Prediabetes A1c is at goal of less than 6.5 and prediabetes is well-controlled with diet only at this time Lab Results  Component Value Date   HGBA1C 5.9 (A) 05/04/2023    Lab Results  Component Value Date   HGBA1C 5.9 (H) 10/10/2022     HTN Blood pressure currently at goal with hydrochlorothiazide 25 mg daily.  She is also on clonidine 0.1 mg at bedtime however this is prescribed from behavioral health for her mental health diagnosis. BP Readings from Last 3 Encounters:  05/04/23 130/80  01/02/23 122/60  10/10/22 119/80    Review of Systems  Constitutional:  Negative for fever, malaise/fatigue and weight loss.  HENT: Negative.  Negative for nosebleeds.   Eyes: Negative.  Negative for blurred vision, double vision and photophobia.  Respiratory: Negative.  Negative for cough and shortness of breath.   Cardiovascular: Negative.  Negative for chest pain, palpitations and leg swelling.  Gastrointestinal: Negative.  Negative for heartburn, nausea and vomiting.  Musculoskeletal: Negative.  Negative for myalgias.  Neurological: Negative.  Negative for dizziness, focal weakness, seizures and headaches.  Psychiatric/Behavioral: Negative.  Negative for suicidal ideas.     Past Medical History:  Diagnosis Date   Anxiety and depression    Bipolar disorder (HCC)    Cystitis, interstitial    Elevated troponin  a. 09/2015: CP/elevated troponin up to 0.11 - unclear significance. CT neg for PE, LHC neg for CAD, normal echo.   Hypertension    Microcytic anemia    Noted on labs   Panic attack    Paranoid schizophrenia (HCC)    Tobacco abuse     Past Surgical History:  Procedure Laterality Date   CARDIAC CATHETERIZATION N/A 09/29/2015   Procedure:  Left Heart Cath and Coronary Angiography;  Surgeon: Corky Crafts, MD;  Location: [facility] INVASIVE CV [facility];  Service: Cardiovascular;  Laterality: N/A;   TUBAL LIGATION      Family History  Problem Relation Age of Onset   Diabetes Father    Hypertension Mother    Hypertension Sister    Lupus Sister    Heart disease Maternal Grandfather    Lupus Other     Social History Reviewed with no changes to be made today.   Outpatient Medications Prior to Visit  Medication Sig Dispense Refill   atorvastatin (LIPITOR) 10 MG tablet Take 1 tablet (10 mg total) by mouth daily. 90 tablet 3   cloNIDine (CATAPRES) 0.1 MG tablet Take 1 tablet (0.1 mg total) by mouth at bedtime. 90 tablet 2   Deutetrabenazine ER (AUSTEDO XR) 24 MG TB24 Take 24 mg by mouth daily. 30 tablet 2   donepezil (ARICEPT) 5 MG tablet TAKE 1 TABLET BY MOUTH AT BEDTIME 28 tablet 0   ferrous sulfate (FEROSUL) 325 (65 FE) MG tablet Take 1 tablet (325 mg total) by mouth daily with breakfast. 90 tablet 3   FLUoxetine (PROZAC) 40 MG capsule Take 1 capsule (40 mg total) by mouth daily. 90 capsule 2   gabapentin (NEURONTIN) 100 MG capsule Take 1 capsule (100 mg total) by mouth 2 (two) times daily. Once in the am and once in the pm 180 capsule 1   hydrochlorothiazide (HYDRODIURIL) 25 MG tablet Take 1 tablet (25 mg total) by mouth daily. 90 tablet 1   hydrOXYzine (ATARAX) 50 MG tablet Take 1 tablet (50 mg total) by mouth every 8 (eight) hours as needed for anxiety. 42 tablet 0   hydrOXYzine (ATARAX) 50 MG tablet Take 1 tablet (50 mg total) by mouth every 8 (eight) hours as needed for anxiety. 42 tablet 0   mirtazapine (REMERON) 15 MG tablet Take 1 tablet (15 mg total) by mouth at bedtime. 90 tablet 2   QUEtiapine (SEROQUEL) 400 MG tablet Take 1 tablet (400 mg total) by mouth at bedtime. 30 tablet 2   ramelteon (ROZEREM) 8 MG tablet Take 1 tablet (8 mg total) by mouth at bedtime. Patient can take an additional dose (1 tablet, 8 mg total) if  patient continues to have issues with sleep. 30 tablet 2   Suvorexant (BELSOMRA) 5 MG TABS Take 1 tablet (5 mg total) by mouth at bedtime as needed. 30 tablet 0   No facility-administered medications prior to visit.    Allergies  Allergen Reactions   Penicillins Itching and Rash       Objective:    BP 130/80 (BP Location: Left Arm, Patient Position: Sitting, Cuff Size: Normal)   Pulse 77   Ht 5\' 4"  (1.626 m)   Wt 196 lb 9.6 oz (89.2 kg)   LMP  (LMP Unknown)   SpO2 100%   BMI 33.75 kg/m  Wt Readings from Last 3 Encounters:  05/04/23 196 lb 9.6 oz (89.2 kg)  01/02/23 185 lb 3.2 oz (84 kg)  12/22/22 180 lb (81.6 kg)    Physical  Exam Vitals and nursing note reviewed.  Constitutional:      Appearance: She is well-developed.  HENT:     Head: Normocephalic and atraumatic.  Cardiovascular:     Rate and Rhythm: Normal rate and regular rhythm.     Heart sounds: Normal heart sounds. No murmur heard.    No friction rub. No gallop.  Pulmonary:     Effort: Pulmonary effort is normal. No tachypnea or respiratory distress.     Breath sounds: Normal breath sounds. No decreased breath sounds, wheezing, rhonchi or rales.  Chest:     Chest wall: No tenderness.  Abdominal:     General: Bowel sounds are normal.     Palpations: Abdomen is soft.  Musculoskeletal:        General: Normal range of motion.     Cervical back: Normal range of motion.  Skin:    General: Skin is warm and dry.  Neurological:     Mental Status: She is alert and oriented to person, place, and time.     Coordination: Coordination normal.  Psychiatric:        Behavior: Behavior normal. Behavior is cooperative.        Thought Content: Thought content normal.          Patient has been counseled extensively about nutrition and exercise as well as the importance of adherence with medications and regular follow-up. The patient was given clear instructions to go to ER or return to medical center if symptoms don't  improve, worsen or new problems develop. The patient verbalized understanding.   Follow-up: Return in about 6 months (around 11/04/2023).   Claiborne Rigg, FNP-BC [facility] [place], [place] [phone]   05/04/2023, 11:04 AM

## 2023-05-22 ENCOUNTER — Other Ambulatory Visit: Payer: Self-pay | Admitting: Nurse Practitioner

## 2023-05-22 ENCOUNTER — Other Ambulatory Visit: Payer: Self-pay | Admitting: Family Medicine

## 2023-05-22 ENCOUNTER — Other Ambulatory Visit (HOSPITAL_COMMUNITY): Payer: Self-pay | Admitting: Physician Assistant

## 2023-05-22 DIAGNOSIS — G479 Sleep disorder, unspecified: Secondary | ICD-10-CM

## 2023-05-22 DIAGNOSIS — F0390 Unspecified dementia without behavioral disturbance: Secondary | ICD-10-CM

## 2023-05-23 ENCOUNTER — Ambulatory Visit (INDEPENDENT_AMBULATORY_CARE_PROVIDER_SITE_OTHER): Payer: Medicare Other | Admitting: Physician Assistant

## 2023-05-23 ENCOUNTER — Telehealth (INDEPENDENT_AMBULATORY_CARE_PROVIDER_SITE_OTHER): Payer: Self-pay | Admitting: Nurse Practitioner

## 2023-05-23 ENCOUNTER — Encounter (HOSPITAL_COMMUNITY): Payer: Self-pay | Admitting: Physician Assistant

## 2023-05-23 DIAGNOSIS — F251 Schizoaffective disorder, depressive type: Secondary | ICD-10-CM | POA: Diagnosis not present

## 2023-05-23 DIAGNOSIS — G479 Sleep disorder, unspecified: Secondary | ICD-10-CM | POA: Diagnosis not present

## 2023-05-23 DIAGNOSIS — F333 Major depressive disorder, recurrent, severe with psychotic symptoms: Secondary | ICD-10-CM

## 2023-05-23 DIAGNOSIS — G2401 Drug induced subacute dyskinesia: Secondary | ICD-10-CM

## 2023-05-23 DIAGNOSIS — F0390 Unspecified dementia without behavioral disturbance: Secondary | ICD-10-CM

## 2023-05-23 MED ORDER — BELSOMRA 5 MG PO TABS
5.0000 mg | ORAL_TABLET | Freq: Every evening | ORAL | 0 refills | Status: AC | PRN
Start: 2023-06-02 — End: ?

## 2023-05-23 MED ORDER — QUETIAPINE FUMARATE 400 MG PO TABS
400.0000 mg | ORAL_TABLET | Freq: Every day | ORAL | 2 refills | Status: DC
Start: 2023-05-23 — End: 2023-06-14

## 2023-05-23 MED ORDER — AUSTEDO XR 24 MG PO TB24
24.0000 mg | ORAL_TABLET | Freq: Every day | ORAL | 2 refills | Status: AC
Start: 2023-05-23 — End: ?

## 2023-05-23 MED ORDER — DONEPEZIL HCL 5 MG PO TABS
5.0000 mg | ORAL_TABLET | Freq: Every day | ORAL | 1 refills | Status: AC
Start: 2023-05-23 — End: ?

## 2023-05-23 MED ORDER — FLUOXETINE HCL 40 MG PO CAPS
40.0000 mg | ORAL_CAPSULE | Freq: Every day | ORAL | 2 refills | Status: AC
Start: 2023-05-23 — End: ?

## 2023-05-23 NOTE — Progress Notes (Signed)
BH MD/PA/NP OP Progress Note  05/23/2023 10:19 PM Jody [name]  MRN:  [id number]  Chief Complaint:  Chief Complaint  Patient presents with   Follow-up   Medication Refill   HPI:   Jody [name] is a 52 year old, African-American female with a past psychiatric history significant for schizoaffective disorder (depressed type), major depressive disorder, generalized anxiety disorder, sleep disturbances, and major neurocognitive disorder who presents to [facility], accompanied by her sister Jody [name], [phone]), for follow up and medication management. Patient is currently being managed on the following psychiatric medications:  Seroquel 400 mg at bedtime Prozac 40 mg daily Belsomra 5 mg at bedtime Austedo XR 24 mg daily  Patient presents today encounter reporting no issues or concerns regarding her current medication regimen.  Per patient's sister, patient has not been able to be prescribed her donepezil and states that she does not have another provider that prescribes the medication.  Provider informed patient and her sister that her medication can be prescribed by this facility.  Since the last encounter, patient has been in a better mood.  Since being placed on Belsomra, patient's sleep has improved.  Patient also reports that her Seroquel has been helpful in managing her mood ever since the dosage was increased.  Patient denies overt depressive symptoms but does continue to endorse some anxiety attributed to her mother's health.  Patient rates her anxiety at 3 out of 10 but denies any other stressors at this time.  A PHQ-9 screen was performed with the patient scoring a 7.  A GAD-7 screen was also performed the patient scoring a 17.  Patient is alert and oriented x 4, calm, cooperative, and fully engaged in conversation during the encounter.  Patient endorses good mood.  Patient denies suicidal or homicidal ideations.  She further  denies auditory or visual hallucinations and does not appear to be responding to internal/external stimuli.  Patient endorses improved sleep and states that she receives on average 4 hours of sleep per night.  Patient endorses good appetite and eats on average 3 meals per day.  Patient denies alcohol consumption, tobacco use, or illicit drug use.  Visit Diagnosis:    ICD-10-CM   1. Major neurocognitive disorder (HCC)  F03.90 donepezil (ARICEPT) 5 MG tablet    FLUoxetine (PROZAC) 40 MG capsule    2. Schizoaffective disorder, depressive type (HCC)  F25.1 QUEtiapine (SEROQUEL) 400 MG tablet    Deutetrabenazine ER (AUSTEDO XR) 24 MG TB24    3. Sleep disturbances  G47.9 Suvorexant (BELSOMRA) 5 MG TABS    4. MDD (major depressive disorder), recurrent, severe, with psychosis (HCC)  F33.3 FLUoxetine (PROZAC) 40 MG capsule    5. Tardive dyskinesia  G24.01 Deutetrabenazine ER (AUSTEDO XR) 24 MG TB24      Past Psychiatric History:  Schizophrenia - diagnosed roughly 7 years ago when patient was attending [facility] Patient was diagnosed with schizoaffective disorder (depressive type) when admitted to [facility] from 04/08/2022 - 04/12/2022.  In addition to being diagnosed with schizoaffective disorder, patient was treated for insomnia, generalized anxiety disorder, and tardive dyskinesia.   **Onset of dementia, major neurocognitive disorder, due to Alzheimer's disease, without behavioral disturbance, mild  Past Medical History:  Past Medical History:  Diagnosis Date   Anxiety and depression    Bipolar disorder (HCC)    Cystitis, interstitial    Elevated troponin    a. 09/2015: CP/elevated troponin up to 0.11 - unclear significance. CT neg for  PE, LHC neg for CAD, normal echo.   Hypertension    Microcytic anemia    Noted on labs   Panic attack    Paranoid schizophrenia (HCC)    Tobacco abuse     Past Surgical History:  Procedure Laterality Date   CARDIAC  CATHETERIZATION N/A 09/29/2015   Procedure: Left Heart Cath and Coronary Angiography;  Surgeon: Corky Crafts, MD;  Location: [facility] INVASIVE CV LAB;  Service: Cardiovascular;  Laterality: N/A;   TUBAL LIGATION      Family Psychiatric History:  Grandmother (maternal) - unsure of the diagnosis but states that her grandmother had mental health issues Uncle (maternal) - Schizophrenia and bipolar disorder, he is currently taking medications but is unsure of the type.   Family history of suicide: Patient denies Family history of homicide: Patient denies Family history of substance abuse: Per patient's sister, alcohol abuse runs in the family  Family History:  Family History  Problem Relation Age of Onset   Diabetes Father    Hypertension Mother    Hypertension Sister    Lupus Sister    Heart disease Maternal Grandfather    Lupus Other     Social History:  Social History   Socioeconomic History   Marital status: Single    Spouse name: Not on file   Number of children: 5   Years of education: 12   Highest education level: Not on file  Occupational History   Occupation: Disabled  Tobacco Use   Smoking status: Former    Current packs/day: 0.00    Average packs/day: 0.3 packs/day for 4.0 years (1.0 ttl pk-yrs)    Types: Cigarettes    Start date: 05/01/2012    Quit date: 05/01/2016    Years since quitting: 7.0   Smokeless tobacco: Never  Vaping Use   Vaping status: Never Used  Substance and Sexual Activity   Alcohol use: Not Currently    Alcohol/week: 0.0 standard drinks of alcohol    Comment: Quit 2017   Drug use: No   Sexual activity: Not Currently    Birth control/protection: Condom  Other Topics Concern   Not on file  Social History Narrative   Lives alone   Caffeine use: Soda- daily   Right-handed   Social Determinants of Health   Financial Resource Strain: Low Risk  (12/22/2022)   Overall Financial Resource Strain (CARDIA)    Difficulty of Paying Living  Expenses: Not hard at all  Food Insecurity: No Food Insecurity (12/22/2022)   Hunger Vital Sign    Worried About Running Out of Food in the Last Year: Never true    Ran Out of Food in the Last Year: Never true  Transportation Needs: No Transportation Needs (12/22/2022)   PRAPARE - Administrator, Civil Service (Medical): No    Lack of Transportation (Non-Medical): No  Physical Activity: Sufficiently Active (12/22/2022)   Exercise Vital Sign    Days of Exercise per Week: 3 days    Minutes of Exercise per Session: 90 min  Stress: No Stress Concern Present (12/22/2022)   [employer] of Occupational Health - Occupational Stress Questionnaire    Feeling of Stress : Only a little  Social Connections: Not on file    Allergies:  Allergies  Allergen Reactions   Penicillins Itching and Rash    Metabolic Disorder Labs: Lab Results  Component Value Date   HGBA1C 5.9 (A) 05/04/2023   MPG 114.02 04/07/2022   MPG 117 09/28/2015   Lab  Results  Component Value Date   PROLACTIN 9.7 12/01/2022   PROLACTIN 17.2 04/07/2022   Lab Results  Component Value Date   CHOL 135 05/04/2023   TRIG 138 05/04/2023   HDL 39 (L) 05/04/2023   CHOLHDL 3.5 05/04/2023   VLDL 56 (H) 04/09/2022   LDLCALC 72 05/04/2023   LDLCALC 191 (H) 10/10/2022   Lab Results  Component Value Date   TSH 5.855 (H) 04/07/2022   TSH 1.560 06/28/2016    Therapeutic Level Labs: No results found for: "LITHIUM" Lab Results  Component Value Date   VALPROATE 118 (H) 04/26/2016   VALPROATE 136 (H) 04/26/2016   No results found for: "CBMZ"  Current Medications: Current Outpatient Medications  Medication Sig Dispense Refill   atorvastatin (LIPITOR) 10 MG tablet Take 1 tablet (10 mg total) by mouth daily. 90 tablet 3   cloNIDine (CATAPRES) 0.1 MG tablet Take 1 tablet (0.1 mg total) by mouth at bedtime. 90 tablet 2   Deutetrabenazine ER (AUSTEDO XR) 24 MG TB24 Take 24 mg by mouth daily. 30 tablet 2    donepezil (ARICEPT) 5 MG tablet Take 1 tablet (5 mg total) by mouth at bedtime. 30 tablet 1   ferrous sulfate (FEROSUL) 325 (65 FE) MG tablet Take 1 tablet (325 mg total) by mouth daily with breakfast. 90 tablet 3   FLUoxetine (PROZAC) 40 MG capsule Take 1 capsule (40 mg total) by mouth daily. 90 capsule 2   gabapentin (NEURONTIN) 100 MG capsule Take 1 capsule (100 mg total) by mouth 2 (two) times daily. Once in the am and once in the pm 180 capsule 1   hydrochlorothiazide (HYDRODIURIL) 25 MG tablet Take 1 tablet (25 mg total) by mouth daily. 90 tablet 1   hydrOXYzine (ATARAX) 50 MG tablet Take 1 tablet (50 mg total) by mouth every 8 (eight) hours as needed for anxiety. 42 tablet 0   hydrOXYzine (ATARAX) 50 MG tablet Take 1 tablet (50 mg total) by mouth every 8 (eight) hours as needed for anxiety. 42 tablet 0   mirtazapine (REMERON) 15 MG tablet Take 1 tablet (15 mg total) by mouth at bedtime. 90 tablet 2   QUEtiapine (SEROQUEL) 400 MG tablet Take 1 tablet (400 mg total) by mouth at bedtime. 30 tablet 2   ramelteon (ROZEREM) 8 MG tablet Take 1 tablet (8 mg total) by mouth at bedtime. Patient can take an additional dose (1 tablet, 8 mg total) if patient continues to have issues with sleep. 30 tablet 2   [START ON 06/02/2023] Suvorexant (BELSOMRA) 5 MG TABS Take 1 tablet (5 mg total) by mouth at bedtime as needed. 30 tablet 0   No current facility-administered medications for this visit.     Musculoskeletal: Strength & Muscle Tone: within normal limits Gait & Station: normal Patient leans: N/A  Psychiatric Specialty Exam: Review of Systems  Psychiatric/Behavioral:  Positive for sleep disturbance. Negative for decreased concentration, dysphoric mood, hallucinations, self-injury and suicidal ideas. The patient is nervous/anxious. The patient is not hyperactive.     Blood pressure 122/78, pulse 86, temperature 98.6 F (37 C), temperature source Oral, height 5\' 4"  (1.626 m), weight 197 lb 6.4 oz  (89.5 kg), SpO2 100%.Body mass index is 33.88 kg/m.  General Appearance: Casual  Eye Contact:  Good  Speech:  Clear and Coherent and Normal Rate  Volume:  Normal  Mood:  Anxious and Euthymic  Affect:  Appropriate  Thought Process:  Coherent, Goal Directed, and Descriptions of Associations: Intact  Orientation:  Full (Time, Place, and Person)  Thought Content: WDL and Paranoid Ideation   Suicidal Thoughts:  No  Homicidal Thoughts:  No  Memory:  Immediate;   Good Recent;   Fair Remote;   Fair  Judgement:  Good  Insight:  Good  Psychomotor Activity:  TD  Concentration:  Concentration: Good and Attention Span: Good  Recall:  Good  Fund of Knowledge: Fair  Language: Good  Akathisia:  No  Handed:  Right  AIMS (if indicated): done  Assets:  Communication Skills Desire for Improvement Housing Social Support  ADL's:  Intact  Cognition: Impaired,  Mild  Sleep:  Poor   Screenings: Geneticist, molecular Office Visit from 12/08/2022 in [facility] Clinical Support from 11/21/2022 in [facility] Clinical Support from 11/07/2022 in [facility] Office Visit from 10/27/2022 in [facility]) [facility]  AIMS Total Score 8 28 24 17 4       AUDIT    Flowsheet Row [facility]) [facility] (Dis[facility] Ad[facility] (Discharged[facility]  Alcohol Use Disorder Identification Test Final Score (AUDIT) 9 4 20       GAD-7    Flowsheet Row Clinical Support from 05/23/2023 in [facility] Office Visit from 05/04/2023 in [facility] Clinical Support from 04/11/2023 in  [facility] Clinical Support from 03/02/2023 in [facility] Clinical Support from 01/19/2023 in [facility]  Total GAD-7 Score 17 17 19 15 17       Mini-Mental    Flowsheet Row Office Visit from 01/31/2017 in [facility] Office Visit from 11/27/2016 in [facility] Office Visit from 06/28/2016 in [facility] Office Visit from 05/25/2016 in [facility] Office Visit from 03/11/2015 in [facility]  Total Score (max 30 points ) 25 11 18 15 26       PHQ2-9    Flowsheet Row Clinical Support from 05/23/2023 in [facility] Office Visit from 05/04/2023 in [facility] Clinical Support from 04/11/2023 in [facility] Clinical Support from 03/02/2023 in [facility] Clinical Support from 01/19/2023 in [facility]  PHQ-2 Total Score 3 4 4 3 4   PHQ-9 Total Score 7 14 20 18 19       Flowsheet Row Clinical Support from 05/23/2023 in [facility] Clinical Support from 04/11/2023 in [facility] Clinical Support from 03/02/2023 in [facility]  C-SSRS RISK CATEGORY Low Risk Low Risk Low Risk        Assessment and Plan:   Ilayna Spath is a 52 year old, African-American female with a past psychiatric history significant for schizoaffective disorder (depressed type), major depressive disorder, generalized anxiety disorder, sleep disturbances, and major neurocognitive disorder who presents to [facility], accompanied by her sister Jody [name], [phone]), for follow up and medication management.  Patient presents today  encounter endorsing improved sleep since being placed on Belsomra 5 mg at bedtime.  Patient also states that her mood has  improved since her Seroquel was adjusted during the last encounter.  Patient reports no issues or concerns regarding her current medication regimen.  Patient denies experiencing any adverse side effects and further denies the need for dosage adjustments at this time.  Patient does request that her donepezil be refilled by this provider.  Patient is being prescribed donepezil for her major neurocognitive disorder.  Provider to refill patient's donepezil along with her current medication regimen following the conclusion of the encounter.  Patient's medication to be e-prescribed to pharmacy of choice.  Collaboration of Care: Collaboration of Care: Medication Management AEB provider managing patient's psychiatric medications, Primary Care Provider AEB patient being seen by a primary care provider at [facility], Psychiatrist AEB patient being seen by mental health provider at this facility, and Referral or follow-up with counselor/therapist AEB patient being seen by a licensed clinical social worker at this facility  Patient/Guardian was advised Release of Information must be obtained prior to any record release in order to collaborate their care with an outside provider. Patient/Guardian was advised if they have not already done so to contact the registration department to sign all necessary forms in order for [place] to release information regarding their care.   Consent: Patient/Guardian gives verbal consent for treatment and assignment of benefits for services provided during this visit. Patient/Guardian expressed understanding and agreed to proceed.   1. Major neurocognitive disorder (HCC)  - donepezil (ARICEPT) 5 MG tablet; Take 1 tablet (5 mg total) by mouth at bedtime.  Dispense: 30 tablet; Refill: 1 - FLUoxetine (PROZAC) 40 MG capsule; Take 1 capsule (40 mg  total) by mouth daily.  Dispense: 90 capsule; Refill: 2  2. Schizoaffective disorder, depressive type (HCC)  - QUEtiapine (SEROQUEL) 400 MG tablet; Take 1 tablet (400 mg total) by mouth at bedtime.  Dispense: 30 tablet; Refill: 2 - Deutetrabenazine ER (AUSTEDO XR) 24 MG TB24; Take 24 mg by mouth daily.  Dispense: 30 tablet; Refill: 2  3. Sleep disturbances  - Suvorexant (BELSOMRA) 5 MG TABS; Take 1 tablet (5 mg total) by mouth at bedtime as needed.  Dispense: 30 tablet; Refill: 0  4. MDD (major depressive disorder), recurrent, severe, with psychosis (HCC)  - FLUoxetine (PROZAC) 40 MG capsule; Take 1 capsule (40 mg total) by mouth daily.  Dispense: 90 capsule; Refill: 2  5. Tardive dyskinesia  - Deutetrabenazine ER (AUSTEDO XR) 24 MG TB24; Take 24 mg by mouth daily.  Dispense: 30 tablet; Refill: 2  Patient to follow-up in 6 weeks Provider spent a total of 16 minutes with the patient/reviewing patient's chart  Meta Hatchet, PA 05/23/2023, 10:19 PM

## 2023-05-23 NOTE — Telephone Encounter (Signed)
Copied from CRM [id number]. Topic: General - Other >> May 23, 2023  2:57 PM Everette C wrote: Reason for CRM: The patient's sister has been directed by their pharmacy to contact their PCP and request refills of medications  The patient's sister is uncertain of which medications are in need of refill and would like for Marcelino Duster at [facility] to be contacted further when possible [id number] - [place], [place] - [address] [place] [address] Phone: [phone] Fax: [phone] Hours: Not open 24 hours  Please contact further when possible to further discuss

## 2023-05-24 NOTE — Telephone Encounter (Signed)
Patient identified by name and date of birth.  Patient aware of sister needing to be added to [facility] if she would like to discuss anything the patient health. Patient was informed that her medications do have 1 refill left with the medications that Ms. Meredeth Ide has prescribed.

## 2023-06-12 ENCOUNTER — Other Ambulatory Visit (HOSPITAL_COMMUNITY): Payer: Self-pay | Admitting: Physician Assistant

## 2023-06-12 DIAGNOSIS — F251 Schizoaffective disorder, depressive type: Secondary | ICD-10-CM

## 2023-07-03 ENCOUNTER — Other Ambulatory Visit: Payer: Self-pay | Admitting: Nurse Practitioner

## 2023-07-03 ENCOUNTER — Other Ambulatory Visit (HOSPITAL_COMMUNITY): Payer: Self-pay | Admitting: Physician Assistant

## 2023-07-03 DIAGNOSIS — G479 Sleep disorder, unspecified: Secondary | ICD-10-CM

## 2023-07-03 DIAGNOSIS — R232 Flushing: Secondary | ICD-10-CM

## 2023-07-04 ENCOUNTER — Ambulatory Visit (HOSPITAL_COMMUNITY): Payer: Medicare Other | Admitting: Physician Assistant

## 2023-07-04 ENCOUNTER — Encounter (HOSPITAL_COMMUNITY): Payer: Self-pay | Admitting: Physician Assistant

## 2023-07-04 DIAGNOSIS — G479 Sleep disorder, unspecified: Secondary | ICD-10-CM | POA: Diagnosis not present

## 2023-07-04 DIAGNOSIS — G2401 Drug induced subacute dyskinesia: Secondary | ICD-10-CM | POA: Diagnosis not present

## 2023-07-04 DIAGNOSIS — F251 Schizoaffective disorder, depressive type: Secondary | ICD-10-CM | POA: Diagnosis not present

## 2023-07-04 DIAGNOSIS — F0390 Unspecified dementia without behavioral disturbance: Secondary | ICD-10-CM | POA: Diagnosis not present

## 2023-07-04 DIAGNOSIS — F333 Major depressive disorder, recurrent, severe with psychotic symptoms: Secondary | ICD-10-CM

## 2023-07-04 MED ORDER — AUSTEDO XR 24 MG PO TB24
24.0000 mg | ORAL_TABLET | Freq: Every day | ORAL | 2 refills | Status: DC
Start: 2023-07-04 — End: 2023-08-15

## 2023-07-04 MED ORDER — FLUOXETINE HCL 40 MG PO CAPS: 40.0000 mg | ORAL_CAPSULE | Freq: Every day | ORAL | 2 refills | Status: DC

## 2023-07-04 MED ORDER — QUETIAPINE FUMARATE 400 MG PO TABS
400.0000 mg | ORAL_TABLET | Freq: Every day | ORAL | 2 refills | Status: DC
Start: 2023-07-04 — End: 2023-08-15

## 2023-07-04 MED ORDER — BELSOMRA 5 MG PO TABS
5.0000 mg | ORAL_TABLET | Freq: Every evening | ORAL | 1 refills | Status: DC | PRN
Start: 2023-07-04 — End: 2023-08-15

## 2023-07-04 MED ORDER — DONEPEZIL HCL 5 MG PO TABS
5.0000 mg | ORAL_TABLET | Freq: Every day | ORAL | 2 refills | Status: DC
Start: 2023-07-04 — End: 2023-08-15

## 2023-07-04 NOTE — Progress Notes (Signed)
BH MD/PA/NP OP Progress Note  07/04/2023 9:42 PM Jody [name]  MRN:  [id number]  Chief Complaint:  Chief Complaint  Patient presents with   Medication Refill   Follow-up   HPI:   Jody [name] is a 52 year old, African-American female with a past psychiatric history significant for schizoaffective disorder (depressed type), major depressive disorder, generalized anxiety disorder, sleep disturbances, and major neurocognitive disorder who presents to [facility], accompanied by [name], [phone]), for follow up and medication management. Patient is currently being managed on the following psychiatric medications:  Seroquel 400 mg at bedtime Prozac 40 mg daily Belsomra 5 mg at bedtime Austedo XR 24 mg daily Donepezil 5 mg daily  Patient presents today encounter stating that she is dealing with depression due to the recent passing of her mother.  Since her mother's passing, patient reports that she has been taking each day 1 at a time.  She reports that her medications have been helpful during her grieving process.  She states that Belsomra has been especially helpful in managing her sleep and states that she has been more well rested upon waking up.  The only issue that the patient experiences when taking Belsomra is excessive yawning before going to sleep.  Although patient endorses stable mood she continues to endorse depressive symptoms.  Patient rates her depression as 7 out of 10 with 10 being most severe.  Patient endorses depressive episodes every day most likely attributed to the recent passing of her mother.  Patient endorses the following depressive symptoms: feelings of sadness, self-isolation, and irritability.  Patient also endorses anxiety and rates her anxiety as 5 out of 10.  Patient denied any significant stressors at this time.  A PHQ-9 screen was performed with the patient scoring a 21.  A GAD-7 screen was  also performed with the patient scoring a 19.  Patient is alert and oriented x 4, calm, cooperative, and fully engaged in conversation during the encounter.  Patient endorses poor mood.  Patient denies suicidal or homicidal ideations.  She further denies auditory or visual hallucinations and does not appear to be responding to internal/external stimuli.  Patient endorses fair sleep and receives on average 4 to 5 hours of sleep per night.  Patient endorses good appetite and eats on average 4 meals per day.  Patient denies alcohol consumption or illicit drug use.  Patient denies tobacco use but states that she smoked cigarettes roughly 4 days ago.  Visit Diagnosis:    ICD-10-CM   1. Schizoaffective disorder, depressive type (HCC)  F25.1 Deutetrabenazine ER (AUSTEDO XR) 24 MG TB24    QUEtiapine (SEROQUEL) 400 MG tablet    2. Tardive dyskinesia  G24.01 Deutetrabenazine ER (AUSTEDO XR) 24 MG TB24    3. Major neurocognitive disorder (HCC)  F03.90 donepezil (ARICEPT) 5 MG tablet    FLUoxetine (PROZAC) 40 MG capsule    4. Sleep disturbances  G47.9 Suvorexant (BELSOMRA) 5 MG TABS    5. MDD (major depressive disorder), recurrent, severe, with psychosis (HCC)  F33.3 FLUoxetine (PROZAC) 40 MG capsule      Past Psychiatric History:  Schizophrenia - diagnosed roughly 7 years ago when patient was attending [facility] Patient was diagnosed with schizoaffective disorder (depressive type) when admitted to [facility] from 04/08/2022 - 04/12/2022.  In addition to being diagnosed with schizoaffective disorder, patient was treated for insomnia, generalized anxiety disorder, and tardive dyskinesia.   **Onset of dementia, major neurocognitive disorder, due  to Alzheimer's disease, without behavioral disturbance, mild  Past Medical History:  Past Medical History:  Diagnosis Date   Anxiety and depression    Bipolar disorder (HCC)    Cystitis, interstitial    Elevated troponin    a.  09/2015: CP/elevated troponin up to 0.11 - unclear significance. CT neg for PE, LHC neg for CAD, normal echo.   Hypertension    Microcytic anemia    Noted on labs   Panic attack    Paranoid schizophrenia (HCC)    Tobacco abuse     Past Surgical History:  Procedure Laterality Date   CARDIAC CATHETERIZATION N/A 09/29/2015   Procedure: Left Heart Cath and Coronary Angiography;  Surgeon: Corky Crafts, MD;  Location: [facility] INVASIVE CV LAB;  Service: Cardiovascular;  Laterality: N/A;   TUBAL LIGATION      Family Psychiatric History:  Grandmother (maternal) - unsure of the diagnosis but states that her grandmother had mental health issues Uncle (maternal) - Schizophrenia and bipolar disorder, he is currently taking medications but is unsure of the type.   Family history of suicide: Patient denies Family history of homicide: Patient denies Family history of substance abuse: Per patient's sister, alcohol abuse runs in the family  Family History:  Family History  Problem Relation Age of Onset   Diabetes Father    Hypertension Mother    Hypertension Sister    Lupus Sister    Heart disease Maternal Grandfather    Lupus Other     Social History:  Social History   Socioeconomic History   Marital status: Single    Spouse name: Not on file   Number of children: 5   Years of education: 12   Highest education level: Not on file  Occupational History   Occupation: Disabled  Tobacco Use   Smoking status: Former    Current packs/day: 0.00    Average packs/day: 0.3 packs/day for 4.0 years (1.0 ttl pk-yrs)    Types: Cigarettes    Start date: 05/01/2012    Quit date: 05/01/2016    Years since quitting: 7.1   Smokeless tobacco: Never  Vaping Use   Vaping status: Never Used  Substance and Sexual Activity   Alcohol use: Not Currently    Alcohol/week: 0.0 standard drinks of alcohol    Comment: Quit 2017   Drug use: No   Sexual activity: Not Currently    Birth control/protection:  Condom  Other Topics Concern   Not on file  Social History Narrative   Lives alone   Caffeine use: Soda- daily   Right-handed   Social Determinants of Health   Financial Resource Strain: Low Risk  (12/22/2022)   Overall Financial Resource Strain (CARDIA)    Difficulty of Paying Living Expenses: Not hard at all  Food Insecurity: No Food Insecurity (12/22/2022)   Hunger Vital Sign    Worried About Running Out of Food in the Last Year: Never true    Ran Out of Food in the Last Year: Never true  Transportation Needs: No Transportation Needs (12/22/2022)   PRAPARE - Administrator, Civil Service (Medical): No    Lack of Transportation (Non-Medical): No  Physical Activity: Sufficiently Active (12/22/2022)   Exercise Vital Sign    Days of Exercise per Week: 3 days    Minutes of Exercise per Session: 90 min  Stress: No Stress Concern Present (12/22/2022)   [employer] of Occupational Health - Occupational Stress Questionnaire    Feeling of Stress :  Only a little  Social Connections: Not on file    Allergies:  Allergies  Allergen Reactions   Penicillins Itching and Rash    Metabolic Disorder Labs: Lab Results  Component Value Date   HGBA1C 5.9 (A) 05/04/2023   MPG 114.02 04/07/2022   MPG 117 09/28/2015   Lab Results  Component Value Date   PROLACTIN 9.7 12/01/2022   PROLACTIN 17.2 04/07/2022   Lab Results  Component Value Date   CHOL 135 05/04/2023   TRIG 138 05/04/2023   HDL 39 (L) 05/04/2023   CHOLHDL 3.5 05/04/2023   VLDL 56 (H) 04/09/2022   LDLCALC 72 05/04/2023   LDLCALC 191 (H) 10/10/2022   Lab Results  Component Value Date   TSH 5.855 (H) 04/07/2022   TSH 1.560 06/28/2016    Therapeutic Level Labs: No results found for: "LITHIUM" Lab Results  Component Value Date   VALPROATE 118 (H) 04/26/2016   VALPROATE 136 (H) 04/26/2016   No results found for: "CBMZ"  Current Medications: Current Outpatient Medications  Medication Sig Dispense  Refill   atorvastatin (LIPITOR) 10 MG tablet Take 1 tablet (10 mg total) by mouth daily. 90 tablet 3   cloNIDine (CATAPRES) 0.1 MG tablet Take 1 tablet (0.1 mg total) by mouth at bedtime. 90 tablet 2   Deutetrabenazine ER (AUSTEDO XR) 24 MG TB24 Take 24 mg by mouth daily. 30 tablet 2   donepezil (ARICEPT) 5 MG tablet Take 1 tablet (5 mg total) by mouth at bedtime. 30 tablet 2   ferrous sulfate (FEROSUL) 325 (65 FE) MG tablet Take 1 tablet (325 mg total) by mouth daily with breakfast. 90 tablet 3   FLUoxetine (PROZAC) 40 MG capsule Take 1 capsule (40 mg total) by mouth daily. 90 capsule 2   gabapentin (NEURONTIN) 100 MG capsule TAKE 1 CAPSULE BY MOUTH TWICE A DAY  ( IN THE MORNING & IN THE EVENING ) 56 capsule 1   hydrochlorothiazide (HYDRODIURIL) 25 MG tablet Take 1 tablet (25 mg total) by mouth daily. 90 tablet 1   hydrOXYzine (ATARAX) 50 MG tablet Take 1 tablet (50 mg total) by mouth every 8 (eight) hours as needed for anxiety. 42 tablet 0   hydrOXYzine (ATARAX) 50 MG tablet Take 1 tablet (50 mg total) by mouth every 8 (eight) hours as needed for anxiety. 42 tablet 0   mirtazapine (REMERON) 15 MG tablet Take 1 tablet (15 mg total) by mouth at bedtime. 90 tablet 2   QUEtiapine (SEROQUEL) 400 MG tablet Take 1 tablet (400 mg total) by mouth at bedtime. 30 tablet 2   ramelteon (ROZEREM) 8 MG tablet Take 1 tablet (8 mg total) by mouth at bedtime. Patient can take an additional dose (1 tablet, 8 mg total) if patient continues to have issues with sleep. 30 tablet 2   Suvorexant (BELSOMRA) 5 MG TABS Take 1 tablet (5 mg total) by mouth at bedtime as needed. 30 tablet 1   No current facility-administered medications for this visit.     Musculoskeletal: Strength & Muscle Tone: within normal limits Gait & Station: normal Patient leans: N/A  Psychiatric Specialty Exam: Review of Systems  Psychiatric/Behavioral:  Positive for dysphoric mood and sleep disturbance. Negative for decreased concentration,  hallucinations, self-injury and suicidal ideas. The patient is nervous/anxious. The patient is not hyperactive.     Blood pressure 117/85, pulse 78, temperature 98.5 F (36.9 C), temperature source Oral, height 5\' 4"  (1.626 m), weight 198 lb 3.2 oz (89.9 kg), SpO2 100%.Body mass index  is 34.02 kg/m.  General Appearance: Casual  Eye Contact:  Good  Speech:  Clear and Coherent and Normal Rate  Volume:  Normal  Mood:  Anxious, Depressed, and Dysphoric  Affect:  Congruent  Thought Process:  Coherent, Goal Directed, and Descriptions of Associations: Intact  Orientation:  Full (Time, Place, and Person)  Thought Content: WDL and Paranoid Ideation   Suicidal Thoughts:  No  Homicidal Thoughts:  No  Memory:  Immediate;   Good Recent;   Fair Remote;   Fair  Judgement:  Good  Insight:  Good  Psychomotor Activity:  TD  Concentration:  Concentration: Good and Attention Span: Good  Recall:  Good  Fund of Knowledge: Fair  Language: Good  Akathisia:  No  Handed:  Right  AIMS (if indicated): done  Assets:  Communication Skills Desire for Improvement Housing Social Support  ADL's:  Intact  Cognition: Impaired,  Mild  Sleep:  Fair   Screenings: Geneticist, molecular Office Visit from 12/08/2022 in [facility] Clinical Support from 11/21/2022 in [facility] Clinical Support from 11/07/2022 in [facility] Office Visit from 10/27/2022 in [facility]) [facility]  AIMS Total Score 8 28 24 17 4       AUDIT    Flowsheet Row [facility]) [facility] (Dis[facility] Ad[facility] (Discharged[facility]  Alcohol Use Disorder  Identification Test Final Score (AUDIT) 9 4 20       GAD-7    Flowsheet Row Clinical Support from 07/04/2023 in [facility] Clinical Support from 05/23/2023 in [facility] Office Visit from 05/04/2023 in [facility] Clinical Support from 04/11/2023 in [facility] Clinical Support from 03/02/2023 in [facility]  Total GAD-7 Score 19 17 17 19 15       Mini-Mental    Flowsheet Row Office Visit from 01/31/2017 in [facility] Office Visit from 11/27/2016 in [facility] Office Visit from 06/28/2016 in [facility] Office Visit from 05/25/2016 in [facility] Office Visit from 03/11/2015 in [facility]  Total Score (max 30 points ) 25 11 18 15 26       PHQ2-9    Flowsheet Row Clinical Support from 07/04/2023 in [facility] Clinical Support from 05/23/2023 in [facility] Office Visit from 05/04/2023 in [facility] Clinical Support from 04/11/2023 in [facility] Clinical Support from 03/02/2023 in [facility]  PHQ-2 Total Score 6 3 4 4 3   PHQ-9 Total Score 21 7 14 20 18       Flowsheet Row Clinical Support from 07/04/2023 in [facility] Clinical Support from 05/23/2023 in [facility] Clinical Support from 04/11/2023 in [facility]  C-SSRS RISK CATEGORY Low Risk Low Risk Low Risk        Assessment and Plan:   Jody [name] is a 52 year old, African-American female with a past psychiatric history significant for schizoaffective disorder (depressed type), major depressive disorder,  generalized anxiety disorder, sleep disturbances,  and major neurocognitive disorder who presents to [facility], accompanied by [name], [phone]), for follow up and medication management.  Patient presents to the encounter stating that she has been dealing with depression and anxiety attributed to the recent passing of her mother.  Despite experiencing her depressive symptoms and anxiety, she reports that her medications continue to be helpful to her during the grieving process.  Patient also states that her Belsomra has been helpful in managing her sleep.  Patient would like to continue taking her medications as prescribed.  Patient's medications to be e-prescribed to pharmacy of choice.  Collaboration of Care: Collaboration of Care: Medication Management AEB provider managing patient's psychiatric medications, Primary Care Provider AEB patient being seen by a primary care provider at [facility], Psychiatrist AEB patient being seen by mental health provider at this facility, and Referral or follow-up with counselor/therapist AEB patient being seen by a licensed clinical social worker at this facility  Patient/Guardian was advised Release of Information must be obtained prior to any record release in order to collaborate their care with an outside provider. Patient/Guardian was advised if they have not already done so to contact the registration department to sign all necessary forms in order for [place] to release information regarding their care.   Consent: Patient/Guardian gives verbal consent for treatment and assignment of benefits for services provided during this visit. Patient/Guardian expressed understanding and agreed to proceed.   1. Schizoaffective disorder, depressive type (HCC)  - Deutetrabenazine ER (AUSTEDO XR) 24 MG TB24; Take 24 mg by mouth daily.  Dispense: 30 tablet; Refill: 2 - QUEtiapine (SEROQUEL) 400  MG tablet; Take 1 tablet (400 mg total) by mouth at bedtime.  Dispense: 30 tablet; Refill: 2  2. Tardive dyskinesia  - Deutetrabenazine ER (AUSTEDO XR) 24 MG TB24; Take 24 mg by mouth daily.  Dispense: 30 tablet; Refill: 2  3. Major neurocognitive disorder (HCC)  - donepezil (ARICEPT) 5 MG tablet; Take 1 tablet (5 mg total) by mouth at bedtime.  Dispense: 30 tablet; Refill: 2 - FLUoxetine (PROZAC) 40 MG capsule; Take 1 capsule (40 mg total) by mouth daily.  Dispense: 90 capsule; Refill: 2  4. Sleep disturbances  - Suvorexant (BELSOMRA) 5 MG TABS; Take 1 tablet (5 mg total) by mouth at bedtime as needed.  Dispense: 30 tablet; Refill: 1  5. MDD (major depressive disorder), recurrent, severe, with psychosis (HCC)  - FLUoxetine (PROZAC) 40 MG capsule; Take 1 capsule (40 mg total) by mouth daily.  Dispense: 90 capsule; Refill: 2  Patient to follow-up in 6 weeks Provider spent a total of 18 minutes with the patient/reviewing patient's chart  Meta Hatchet, PA 07/04/2023, 9:42 PM

## 2023-08-13 ENCOUNTER — Telehealth: Payer: Self-pay

## 2023-08-13 NOTE — Telephone Encounter (Signed)
Copied from CRM [id number]. Topic: Referral - Request for Referral >> Aug 09, 2023  2:23 PM Clide Dales wrote: Did the patient discuss referral with their provider in the last year? Yes (If No - schedule appointment) (If Yes - send message)  Appointment offered? No  Type of order/referral and detailed reason for visit: Blood in stool  Preference of office, provider, location: [facility]  If referral order, have you been seen by this specialty before? No (If Yes, this issue or another issue? When? Where?  Can we respond through MyChart? Yes

## 2023-08-14 ENCOUNTER — Other Ambulatory Visit: Payer: Self-pay

## 2023-08-14 NOTE — Telephone Encounter (Signed)
Patient identified by name and date of birth.  Patient given contact information for [facility].

## 2023-08-15 ENCOUNTER — Ambulatory Visit (INDEPENDENT_AMBULATORY_CARE_PROVIDER_SITE_OTHER): Payer: Medicare Other | Admitting: Physician Assistant

## 2023-08-15 DIAGNOSIS — F333 Major depressive disorder, recurrent, severe with psychotic symptoms: Secondary | ICD-10-CM

## 2023-08-15 DIAGNOSIS — G479 Sleep disorder, unspecified: Secondary | ICD-10-CM

## 2023-08-15 DIAGNOSIS — F251 Schizoaffective disorder, depressive type: Secondary | ICD-10-CM | POA: Diagnosis not present

## 2023-08-15 DIAGNOSIS — F0390 Unspecified dementia without behavioral disturbance: Secondary | ICD-10-CM | POA: Diagnosis not present

## 2023-08-15 DIAGNOSIS — G2401 Drug induced subacute dyskinesia: Secondary | ICD-10-CM | POA: Diagnosis not present

## 2023-08-18 ENCOUNTER — Encounter (HOSPITAL_COMMUNITY): Payer: Self-pay | Admitting: Physician Assistant

## 2023-08-18 MED ORDER — FLUOXETINE HCL 40 MG PO CAPS: 40.0000 mg | ORAL_CAPSULE | Freq: Every day | ORAL | 2 refills | Status: DC

## 2023-08-18 MED ORDER — DONEPEZIL HCL 5 MG PO TABS
5.0000 mg | ORAL_TABLET | Freq: Every day | ORAL | 2 refills | Status: DC
Start: 2023-08-18 — End: 2023-09-25

## 2023-08-18 MED ORDER — AUSTEDO XR 24 MG PO TB24
24.0000 mg | ORAL_TABLET | Freq: Every day | ORAL | 2 refills | Status: DC
Start: 2023-08-18 — End: 2023-09-25

## 2023-08-18 MED ORDER — BELSOMRA 5 MG PO TABS
5.0000 mg | ORAL_TABLET | Freq: Every evening | ORAL | 1 refills | Status: DC | PRN
Start: 2023-08-18 — End: 2023-09-25

## 2023-08-18 MED ORDER — QUETIAPINE FUMARATE 400 MG PO TABS
400.0000 mg | ORAL_TABLET | Freq: Every day | ORAL | 2 refills | Status: DC
Start: 2023-08-18 — End: 2023-09-25

## 2023-08-18 NOTE — Progress Notes (Signed)
BH MD/PA/NP OP Progress Note  08/18/2023 5:09 PM [name]  MRN:  [id number]  Chief Complaint:  Chief Complaint  Patient presents with   Follow-up   Medication Management   HPI:   Jody [name] is a 52 year old, African-American female with a past psychiatric history significant for schizoaffective disorder (depressed type), major depressive disorder, generalized anxiety disorder, sleep disturbances, and major neurocognitive disorder who presents to [facility], accompanied by [name], [phone]), for follow up and medication management. Patient is currently being managed on the following psychiatric medications:  Seroquel 400 mg at bedtime Prozac 40 mg daily Belsomra 5 mg at bedtime [place] XR 24 mg daily Donepezil 5 mg daily  Patient presents to the encounter stating that it has been difficult for her to go to bed.  She reports that she occasionally naps during the day.  Patient's sister believes that the patient is receiving an sleep; however, patient disagrees.  In addition to issues with sleep, patient states that her feet move quite a bit; however, she believes this may be due to her being nervous.  She reports that she is still trying to get over her mother's passing.  In regards to her mood, patient reports that some days she does not want to be bothered.  Her sister reports that she encouraged the patient to receive grief counseling; however, patient denies wanting to receive grief counseling at this time.  Patient continues to endorse depression and rates her depression as 7 out of 10 with 10 being most severe.  Patient endorses depressive episodes 2 to 3 days out of the week.  Patient endorses the following depressive symptoms: feelings of sadness, lack of motivation, decreased energy, irritability, and worthlessness.  Patient also endorses anxiety attributed to her mother's passing.  A PHQ-9 screen was  performed with the patient scoring at 13.  A GAD-7 screen was also performed with the patient scoring an 8.  Patient is alert and oriented x 4, calm, cooperative, and fully engaged in conversation during the encounter.  Patient describes her mood as irritable and attributes her irritability to her mother's passing.  Patient denies suicidal or homicidal ideations.  She further denies auditory or visual hallucinations and does not appear to be responding to internal/external stimuli.  Patient endorses fair sleep and receives on average 4 hours of sleep per night as well as napping during the day.  Patient endorses good appetite and eats on average 4 meals per day.  Patient denies alcohol consumption, tobacco use, or illicit drug use.  Visit Diagnosis:    ICD-10-CM   1. Sleep disturbances  G47.9 Suvorexant (BELSOMRA) 5 MG TABS    2. Major neurocognitive disorder (HCC)  F03.90 donepezil (ARICEPT) 5 MG tablet    FLUoxetine (PROZAC) 40 MG capsule    3. Schizoaffective disorder, depressive type (HCC)  F25.1 QUEtiapine (SEROQUEL) 400 MG tablet    Deutetrabenazine ER (AUSTEDO XR) 24 MG TB24    4. MDD (major depressive disorder), recurrent, severe, with psychosis (HCC)  F33.3 FLUoxetine (PROZAC) 40 MG capsule    5. Tardive dyskinesia  G24.01 Deutetrabenazine ER (AUSTEDO XR) 24 MG TB24      Past Psychiatric History:  Schizophrenia - diagnosed roughly 7 years ago when patient was attending [facility] Patient was diagnosed with schizoaffective disorder (depressive type) when admitted to [facility] from 04/08/2022 - 04/12/2022.  In addition to being diagnosed with schizoaffective disorder, patient was treated for insomnia, generalized  anxiety disorder, and tardive dyskinesia.   **Onset of dementia, major neurocognitive disorder, due to Alzheimer's disease, without behavioral disturbance, mild  Past Medical History:  Past Medical History:  Diagnosis Date   Anxiety and  depression    Bipolar disorder (HCC)    Cystitis, interstitial    Elevated troponin    a. 09/2015: CP/elevated troponin up to 0.11 - unclear significance. CT neg for PE, LHC neg for CAD, normal echo.   Hypertension    Microcytic anemia    Noted on labs   Panic attack    Paranoid schizophrenia (HCC)    Tobacco abuse     Past Surgical History:  Procedure Laterality Date   CARDIAC CATHETERIZATION N/A 09/29/2015   Procedure: Left Heart Cath and Coronary Angiography;  Surgeon: Corky Crafts, MD;  Location: [facility] INVASIVE CV LAB;  Service: Cardiovascular;  Laterality: N/A;   TUBAL LIGATION      Family Psychiatric History:  Grandmother (maternal) - unsure of the diagnosis but states that her grandmother had mental health issues Uncle (maternal) - Schizophrenia and bipolar disorder, he is currently taking medications but is unsure of the type.   Family history of suicide: Patient denies Family history of homicide: Patient denies Family history of substance abuse: Per patient's sister, alcohol abuse runs in the family  Family History:  Family History  Problem Relation Age of Onset   Diabetes Father    Hypertension Mother    Hypertension Sister    Lupus Sister    Heart disease Maternal Grandfather    Lupus Other     Social History:  Social History   Socioeconomic History   Marital status: Single    Spouse name: Not on file   Number of children: 5   Years of education: 12   Highest education level: Not on file  Occupational History   Occupation: Disabled  Tobacco Use   Smoking status: Former    Current packs/day: 0.00    Average packs/day: 0.3 packs/day for 4.0 years (1.0 ttl pk-yrs)    Types: Cigarettes    Start date: 05/01/2012    Quit date: 05/01/2016    Years since quitting: 7.3   Smokeless tobacco: Never  Vaping Use   Vaping status: Never Used  Substance and Sexual Activity   Alcohol use: Not Currently    Alcohol/week: 0.0 standard drinks of alcohol     Comment: Quit 2017   Drug use: No   Sexual activity: Not Currently    Birth control/protection: Condom  Other Topics Concern   Not on file  Social History Narrative   Lives alone   Caffeine use: Soda- daily   Right-handed   Social Determinants of Health   Financial Resource Strain: Low Risk  (12/22/2022)   Overall Financial Resource Strain (CARDIA)    Difficulty of Paying Living Expenses: Not hard at all  Food Insecurity: No Food Insecurity (12/22/2022)   Hunger Vital Sign    Worried About Running Out of Food in the Last Year: Never true    Ran Out of Food in the Last Year: Never true  Transportation Needs: No Transportation Needs (12/22/2022)   PRAPARE - Administrator, Civil Service (Medical): No    Lack of Transportation (Non-Medical): No  Physical Activity: Sufficiently Active (12/22/2022)   Exercise Vital Sign    Days of Exercise per Week: 3 days    Minutes of Exercise per Session: 90 min  Stress: No Stress Concern Present (12/22/2022)   [employer]  of Occupational Health - Occupational Stress Questionnaire    Feeling of Stress : Only a little  Social Connections: Not on file    Allergies:  Allergies  Allergen Reactions   Penicillins Itching and Rash    Metabolic Disorder Labs: Lab Results  Component Value Date   HGBA1C 5.9 (A) 05/04/2023   MPG 114.02 04/07/2022   MPG 117 09/28/2015   Lab Results  Component Value Date   PROLACTIN 9.7 12/01/2022   PROLACTIN 17.2 04/07/2022   Lab Results  Component Value Date   CHOL 135 05/04/2023   TRIG 138 05/04/2023   HDL 39 (L) 05/04/2023   CHOLHDL 3.5 05/04/2023   VLDL 56 (H) 04/09/2022   LDLCALC 72 05/04/2023   LDLCALC 191 (H) 10/10/2022   Lab Results  Component Value Date   TSH 5.855 (H) 04/07/2022   TSH 1.560 06/28/2016    Therapeutic Level Labs: No results found for: "LITHIUM" Lab Results  Component Value Date   VALPROATE 118 (H) 04/26/2016   VALPROATE 136 (H) 04/26/2016   No results  found for: "CBMZ"  Current Medications: Current Outpatient Medications  Medication Sig Dispense Refill   atorvastatin (LIPITOR) 10 MG tablet Take 1 tablet (10 mg total) by mouth daily. 90 tablet 3   cloNIDine (CATAPRES) 0.1 MG tablet Take 1 tablet (0.1 mg total) by mouth at bedtime. 90 tablet 2   Deutetrabenazine ER (AUSTEDO XR) 24 MG TB24 Take 24 mg by mouth daily. 30 tablet 2   donepezil (ARICEPT) 5 MG tablet Take 1 tablet (5 mg total) by mouth at bedtime. 30 tablet 2   ferrous sulfate (FEROSUL) 325 (65 FE) MG tablet Take 1 tablet (325 mg total) by mouth daily with breakfast. 90 tablet 3   FLUoxetine (PROZAC) 40 MG capsule Take 1 capsule (40 mg total) by mouth daily. 90 capsule 2   gabapentin (NEURONTIN) 100 MG capsule TAKE 1 CAPSULE BY MOUTH TWICE A DAY  ( IN THE MORNING & IN THE EVENING ) 56 capsule 1   hydrochlorothiazide (HYDRODIURIL) 25 MG tablet Take 1 tablet (25 mg total) by mouth daily. 90 tablet 1   hydrOXYzine (ATARAX) 50 MG tablet Take 1 tablet (50 mg total) by mouth every 8 (eight) hours as needed for anxiety. 42 tablet 0   hydrOXYzine (ATARAX) 50 MG tablet Take 1 tablet (50 mg total) by mouth every 8 (eight) hours as needed for anxiety. 42 tablet 0   mirtazapine (REMERON) 15 MG tablet Take 1 tablet (15 mg total) by mouth at bedtime. 90 tablet 2   QUEtiapine (SEROQUEL) 400 MG tablet Take 1 tablet (400 mg total) by mouth at bedtime. 30 tablet 2   ramelteon (ROZEREM) 8 MG tablet Take 1 tablet (8 mg total) by mouth at bedtime. Patient can take an additional dose (1 tablet, 8 mg total) if patient continues to have issues with sleep. 30 tablet 2   Suvorexant (BELSOMRA) 5 MG TABS Take 1 tablet (5 mg total) by mouth at bedtime as needed. 30 tablet 1   No current facility-administered medications for this visit.     Musculoskeletal: Strength & Muscle Tone: within normal limits Gait & Station: normal Patient leans: N/A  Psychiatric Specialty Exam: Review of Systems   Psychiatric/Behavioral:  Positive for dysphoric mood and sleep disturbance. Negative for decreased concentration, hallucinations, self-injury and suicidal ideas. The patient is nervous/anxious. The patient is not hyperactive.     Blood pressure (!) 137/94, pulse 75, temperature 98.3 F (36.8 C), temperature source Oral, height  5\' 4"  (1.626 m), weight 202 lb (91.6 kg), SpO2 100%.Body mass index is 34.67 kg/m.  General Appearance: Casual  Eye Contact:  Good  Speech:  Clear and Coherent and Normal Rate  Volume:  Normal  Mood:  Anxious, Depressed, and Dysphoric  Affect:  Appropriate  Thought Process:  Coherent, Goal Directed, and Descriptions of Associations: Intact  Orientation:  Full (Time, Place, and Person)  Thought Content: WDL and Paranoid Ideation   Suicidal Thoughts:  No  Homicidal Thoughts:  No  Memory:  Immediate;   Good Recent;   Fair Remote;   Fair  Judgement:  Good  Insight:  Good  Psychomotor Activity:  TD  Concentration:  Concentration: Good and Attention Span: Good  Recall:  Good  Fund of Knowledge: Fair  Language: Good  Akathisia:  No  Handed:  Right  AIMS (if indicated): done  Assets:  Communication Skills Desire for Improvement Housing Social Support  ADL's:  Intact  Cognition: Impaired,  Mild  Sleep:  Fair   Screenings: Geneticist, molecular Office Visit from 12/08/2022 in [facility] Clinical Support from 11/21/2022 in [facility] Clinical Support from 11/07/2022 in [facility] Office Visit from 10/27/2022 in [facility]  AIMS Total Score 8 28 24 17 4       AUDIT    Flowsheet Row [facility]) [facility] Ad[facility] (Discharged[facility]  Admission (Discharged) [facility]  Alcohol Use Disorder Identification Test Final Score (AUDIT) 9 4 20       GAD-7    Flowsheet Row Clinical Support from 08/15/2023 in [facility] Clinical Support from 07/04/2023 in [facility] Clinical Support from 05/23/2023 in [facility] Office Visit from 05/04/2023 in [facility] [place] [facility] [place] Clinical Support from 04/11/2023 in [facility]  Total GAD-7 Score 8 19 17 17 19       Mini-Mental    Flowsheet Row Office Visit from 01/31/2017 in [facility] Office Visit from 11/27/2016 in [facility] Office Visit from 06/28/2016 in [facility] Office Visit from 05/25/2016 in [facility] [place]. [facility] Office Visit from 03/11/2015 in [facility]  Total Score (max 30 points ) 25 11 18 15 26       PHQ2-9    Flowsheet Row Clinical Support from 08/15/2023 in [facility] Clinical Support from 07/04/2023 in [facility] Clinical Support from 05/23/2023 in [facility] Office Visit from 05/04/2023 in [facility] [place] [facility] [place]. [facility] Clinical Support from 04/11/2023 in [facility]  PHQ-2 Total Score 2 6 3 4 4   PHQ-9 Total Score 13 21 7 14 20       Flowsheet Row Clinical Support from 08/15/2023 in [facility] Clinical Support from 07/04/2023 in [facility] Clinical Support from 05/23/2023 in [facility]  C-SSRS RISK CATEGORY Moderate Risk Low Risk Low Risk  Assessment and Plan:   Jody [name] is a 52 year old, African-American female with a past psychiatric history significant for schizoaffective disorder (depressed type), major depressive disorder, generalized anxiety disorder, sleep disturbances, and major neurocognitive disorder who presents to [facility], accompanied by [name], [phone]), for follow up and medication management.  Patient presents to the encounter stating that she has some difficulty going to bed and occasionally naps during the day.  Patient's sister believes that the patient is receiving enough sleep at night; however, patient disagrees.  Provider recommended patient continue taking her Belsomra dosage at the same dose to see if her sleep improves.  Patient was agreeable to recommendation.  Patient continues to endorse ongoing depression and anxiety but attributes these symptoms to grieving over her mother's passing.  Patient would like to continue taking her other medications as prescribed.  Patient's medications to be e-prescribed through pharmacy of choice.  Collaboration of Care: Collaboration of Care: Medication Management AEB provider managing patient's psychiatric medications, Primary Care Provider AEB patient being seen by a primary care provider at [facility], Psychiatrist AEB patient being seen by mental health provider at this facility, and Referral or follow-up with counselor/therapist AEB patient being seen by a licensed clinical social worker at this facility  Patient/Guardian was advised Release of Information must be obtained prior to any record release in order to collaborate their care with an outside provider. Patient/Guardian was advised if they have not already done so to contact the registration department to sign all necessary forms in order for [place] to release information regarding their care.   Consent:  Patient/Guardian gives verbal consent for treatment and assignment of benefits for services provided during this visit. Patient/Guardian expressed understanding and agreed to proceed.   1. Sleep disturbances  - Suvorexant (BELSOMRA) 5 MG TABS; Take 1 tablet (5 mg total) by mouth at bedtime as needed.  Dispense: 30 tablet; Refill: 1  2. Major neurocognitive disorder (HCC)  - donepezil (ARICEPT) 5 MG tablet; Take 1 tablet (5 mg total) by mouth at bedtime.  Dispense: 30 tablet; Refill: 2 - FLUoxetine (PROZAC) 40 MG capsule; Take 1 capsule (40 mg total) by mouth daily.  Dispense: 90 capsule; Refill: 2  3. Schizoaffective disorder, depressive type (HCC)  - QUEtiapine (SEROQUEL) 400 MG tablet; Take 1 tablet (400 mg total) by mouth at bedtime.  Dispense: 30 tablet; Refill: 2 - Deutetrabenazine ER (AUSTEDO XR) 24 MG TB24; Take 24 mg by mouth daily.  Dispense: 30 tablet; Refill: 2  4. MDD (major depressive disorder), recurrent, severe, with psychosis (HCC)  - FLUoxetine (PROZAC) 40 MG capsule; Take 1 capsule (40 mg total) by mouth daily.  Dispense: 90 capsule; Refill: 2  5. Tardive dyskinesia  - Deutetrabenazine ER (AUSTEDO XR) 24 MG TB24; Take 24 mg by mouth daily.  Dispense: 30 tablet; Refill: 2  Patient to follow-up in 5 weeks Provider spent a total of 19 minutes with the patient/reviewing patient's chart  Meta Hatchet, PA 08/18/2023, 5:09 PM

## 2023-08-21 NOTE — Telephone Encounter (Signed)
Pt called saying the GI office said they have not received the referral yet.

## 2023-08-28 ENCOUNTER — Encounter (HOSPITAL_BASED_OUTPATIENT_CLINIC_OR_DEPARTMENT_OTHER): Payer: Self-pay | Admitting: Emergency Medicine

## 2023-08-28 ENCOUNTER — Other Ambulatory Visit (HOSPITAL_BASED_OUTPATIENT_CLINIC_OR_DEPARTMENT_OTHER): Payer: Self-pay

## 2023-08-28 ENCOUNTER — Ambulatory Visit: Payer: Self-pay

## 2023-08-28 ENCOUNTER — Emergency Department (HOSPITAL_BASED_OUTPATIENT_CLINIC_OR_DEPARTMENT_OTHER)
Admission: EM | Admit: 2023-08-28 | Discharge: 2023-08-28 | Disposition: A | Discharge status: Home / Self Care | Payer: Medicare Other | Attending: Emergency Medicine | Admitting: Emergency Medicine

## 2023-08-28 ENCOUNTER — Other Ambulatory Visit: Payer: Self-pay

## 2023-08-28 DIAGNOSIS — I10 Essential (primary) hypertension: Secondary | ICD-10-CM | POA: Diagnosis not present

## 2023-08-28 DIAGNOSIS — Z87891 Personal history of nicotine dependence: Secondary | ICD-10-CM | POA: Insufficient documentation

## 2023-08-28 DIAGNOSIS — Z79899 Other long term (current) drug therapy: Secondary | ICD-10-CM | POA: Diagnosis not present

## 2023-08-28 DIAGNOSIS — R350 Frequency of micturition: Secondary | ICD-10-CM | POA: Diagnosis not present

## 2023-08-28 LAB — BASIC METABOLIC PANEL
Anion gap: 10 (ref 5–15)
BUN: 6 mg/dL (ref 6–20)
CO2: 26 mmol/L (ref 22–32)
Calcium: 9.9 mg/dL (ref 8.9–10.3)
Chloride: 102 mmol/L (ref 98–111)
Creatinine, Ser: 1.09 mg/dL — ABNORMAL HIGH (ref 0.44–1.00)
GFR, Estimated: >60 mL/min (ref >60)
Glucose, Bld: 90 mg/dL (ref 70–99)
Potassium: 3.5 mmol/L (ref 3.5–5.1)
Sodium: 138 mmol/L (ref 135–145)

## 2023-08-28 LAB — URINALYSIS, ROUTINE W REFLEX MICROSCOPIC
Bilirubin Urine: NEGATIVE
Glucose, UA: NEGATIVE mg/dL
Ketones, ur: NEGATIVE mg/dL
Nitrite: NEGATIVE
Protein, ur: NEGATIVE mg/dL
Specific Gravity, Urine: <1.005 — ABNORMAL LOW (ref 1.005–1.030)
pH: 6.5 (ref 5.0–8.0)

## 2023-08-28 LAB — CBC WITH DIFFERENTIAL/PLATELET
Abs Immature Granulocytes: 0.03 10*3/uL (ref 0.00–0.07)
Basophils Absolute: 0 10*3/uL (ref 0.0–0.1)
Basophils Relative: 0 %
Eosinophils Absolute: 0 10*3/uL (ref 0.0–0.5)
Eosinophils Relative: 0 %
HCT: 42.7 % (ref 36.0–46.0)
Hemoglobin: 13.5 g/dL (ref 12.0–15.0)
Immature Granulocytes: 0 %
Lymphocytes Relative: 22 %
Lymphs Abs: 2 10*3/uL (ref 0.7–4.0)
MCH: 24 pg — ABNORMAL LOW (ref 26.0–34.0)
MCHC: 31.6 g/dL (ref 30.0–36.0)
MCV: 76 fL — ABNORMAL LOW (ref 80.0–100.0)
Monocytes Absolute: 0.4 10*3/uL (ref 0.1–1.0)
Monocytes Relative: 4 %
Neutro Abs: 6.6 10*3/uL (ref 1.7–7.7)
Neutrophils Relative %: 74 %
Platelets: 281 10*3/uL (ref 150–400)
RBC: 5.62 MIL/uL — ABNORMAL HIGH (ref 3.87–5.11)
RDW: 14.7 % (ref 11.5–15.5)
WBC: 9.1 10*3/uL (ref 4.0–10.5)
nRBC: 0 % (ref 0.0–0.2)

## 2023-08-28 MED ORDER — SULFAMETHOXAZOLE-TRIMETHOPRIM 800-160 MG PO TABS
1.0000 | ORAL_TABLET | Freq: Two times a day (BID) | ORAL | 0 refills | Status: AC
  Filled 2023-08-28: qty 14, 7d supply, fill #0

## 2023-08-28 NOTE — ED Notes (Signed)
Lab called to add urine culture to urine sample.

## 2023-08-28 NOTE — ED Notes (Signed)
Called lab to add urine culture, spoke with Fayrene Fearing.

## 2023-08-28 NOTE — ED Provider Notes (Addendum)
Jody [name] EMERGENCY DEPARTMENT AT [facility] Provider Note   CSN: [id number] Arrival date & time: 08/28/23  1104     History  No chief complaint on file.   Jody [name] is a 52 y.o. female.  Patient with a history of frequent urination for the past few days.  Was concerned that it was urinary tract infection.  Because she gets frequent ones.  She started taking Septra DS all prescription 2 days ago.  Now feeling better.  But not completely back to normal.  No nausea vomiting no fevers no flank pain no back pain no abdominal pain.  Patient's past medical history significant for interstitial cystitis anxiety paranoid schizophrenia and hypertension.  Patient is a former smoker quit in 2017.       Home Medications Prior to Admission medications   Medication Sig Start Date End Date Taking? Authorizing Provider  atorvastatin (LIPITOR) 10 MG tablet Take 1 tablet (10 mg total) by mouth daily. 01/02/23   Claiborne Rigg, NP  cloNIDine (CATAPRES) 0.1 MG tablet Take 1 tablet (0.1 mg total) by mouth at bedtime. 01/19/23   Nwoko, Tommas Olp, PA  Deutetrabenazine ER (AUSTEDO XR) 24 MG TB24 Take 24 mg by mouth daily. 08/18/23   Nwoko, Tommas Olp, PA  donepezil (ARICEPT) 5 MG tablet Take 1 tablet (5 mg total) by mouth at bedtime. 08/18/23   Nwoko, Tommas Olp, PA  ferrous sulfate (FEROSUL) 325 (65 FE) MG tablet Take 1 tablet (325 mg total) by mouth daily with breakfast. 01/02/23   Claiborne Rigg, NP  FLUoxetine (PROZAC) 40 MG capsule Take 1 capsule (40 mg total) by mouth daily. 08/18/23   Nwoko, Tommas Olp, PA  gabapentin (NEURONTIN) 100 MG capsule TAKE 1 CAPSULE BY MOUTH TWICE A DAY  ( IN THE MORNING & IN THE EVENING ) 07/03/23   Claiborne Rigg, NP  hydrochlorothiazide (HYDRODIURIL) 25 MG tablet Take 1 tablet (25 mg total) by mouth daily. 01/02/23   Claiborne Rigg, NP  hydrOXYzine (ATARAX) 50 MG tablet Take 1 tablet (50 mg total) by mouth every 8 (eight) hours as needed for anxiety.  11/05/22   Ardis Hughs, NP  hydrOXYzine (ATARAX) 50 MG tablet Take 1 tablet (50 mg total) by mouth every 8 (eight) hours as needed for anxiety. 11/05/22   Ardis Hughs, NP  mirtazapine (REMERON) 15 MG tablet Take 1 tablet (15 mg total) by mouth at bedtime. 03/05/23   Nwoko, Tommas Olp, PA  QUEtiapine (SEROQUEL) 400 MG tablet Take 1 tablet (400 mg total) by mouth at bedtime. 08/18/23   Nwoko, Tommas Olp, PA  ramelteon (ROZEREM) 8 MG tablet Take 1 tablet (8 mg total) by mouth at bedtime. Patient can take an additional dose (1 tablet, 8 mg total) if patient continues to have issues with sleep. 03/05/23   Nwoko, Tommas Olp, PA  Suvorexant (BELSOMRA) 5 MG TABS Take 1 tablet (5 mg total) by mouth at bedtime as needed. 08/18/23   Nwoko, Tommas Olp, PA      Allergies    Penicillins    Review of Systems   Review of Systems  Constitutional:  Negative for chills and fever.  HENT:  Negative for ear pain and sore throat.   Eyes:  Negative for pain and visual disturbance.  Respiratory:  Negative for cough and shortness of breath.   Cardiovascular:  Negative for chest pain and palpitations.  Gastrointestinal:  Negative for abdominal pain, diarrhea, nausea and vomiting.  Genitourinary:  Positive for  dysuria and frequency. Negative for hematuria.  Musculoskeletal:  Negative for arthralgias and back pain.  Skin:  Negative for color change and rash.  Neurological:  Negative for seizures and syncope.  All other systems reviewed and are negative.   Physical Exam Updated Vital Signs BP 131/80   Pulse 89   Temp 98 F (36.7 C) (Oral)   Resp 16   Wt 89.8 kg   LMP  (LMP Unknown)   SpO2 97%   BMI 33.99 kg/m  Physical Exam Vitals and nursing note reviewed.  Constitutional:      General: She is not in acute distress.    Appearance: Normal appearance. She is well-developed.  HENT:     Head: Normocephalic and atraumatic.  Eyes:     Extraocular Movements: Extraocular movements intact.      Conjunctiva/sclera: Conjunctivae normal.     Pupils: Pupils are equal, round, and reactive to light.  Cardiovascular:     Rate and Rhythm: Normal rate and regular rhythm.     Heart sounds: No murmur heard. Pulmonary:     Effort: Pulmonary effort is normal. No respiratory distress.     Breath sounds: Normal breath sounds.  Abdominal:     Palpations: Abdomen is soft.     Tenderness: There is no abdominal tenderness. There is no guarding.  Musculoskeletal:        General: No swelling.     Cervical back: Neck supple.  Skin:    General: Skin is warm and dry.     Capillary Refill: Capillary refill takes less than 2 seconds.  Neurological:     General: No focal deficit present.     Mental Status: She is alert and oriented to person, place, and time.  Psychiatric:        Mood and Affect: Mood normal.     ED Results / Procedures / Treatments   Labs (all labs ordered are listed, but only abnormal results are displayed) Labs Reviewed  URINALYSIS, ROUTINE W REFLEX MICROSCOPIC - Abnormal; Notable for the following components:      Result Value   Color, Urine COLORLESS (*)    Specific Gravity, Urine <1.005 (*)    Hgb urine dipstick TRACE (*)    Leukocytes,Ua TRACE (*)    Bacteria, UA RARE (*)    All other components within normal limits  URINE CULTURE  CBC WITH DIFFERENTIAL/PLATELET  BASIC METABOLIC PANEL    EKG None  Radiology No results found.  Procedures Procedures    Medications Ordered in ED Medications - No data to display  ED Course/ Medical Decision Making/ A&P                                 Medical Decision Making Amount and/or Complexity of Data Reviewed Labs: ordered.   Patient's urinalysis here today not consistent with urinary tract infection however patient has been on 2 days of Septra DS.  Urine sent for culture.  Will also get CBC basic metabolic panel just to make sure there is not another cause for the urinary frequency.  Patient nontoxic no  acute distress.  If labs without any significant abnormalities we will treat as possible UTI and have her continue the Septra DS with a new prescription.  Patient does have a primary care doctor to follow-up with.  CBC no leukocytosis hemoglobin 13.5 platelets 281 basic metabolic panel electrolytes are normal renal functions normal.  Creatinine up a  little bit at 1.09.  Urinalysis we already discussed not classic for urinary tract infection and culture sent.  But she been on antibiotics for 2 days.  Patient stable for discharge home we will continue the Septra DS.   Final Clinical Impression(s) / ED Diagnoses Final diagnoses:  Urinary frequency    Rx / DC Orders ED Discharge Orders     None         Vanetta Mulders, MD 08/28/23 [id number]    Vanetta Mulders, MD 08/28/23 6213    Vanetta Mulders, MD 08/28/23 1354

## 2023-08-28 NOTE — Telephone Encounter (Signed)
     Chief Complaint: Pain with urination Symptoms: Above Frequency: 2 days ago Pertinent Negatives: Patient denies fever Disposition: [] ED /[] Urgent Care (no appt availability in office) / [] Appointment(In office/virtual)/ []  [facility]/ [] Home Care/ [] Refused Recommended Disposition /[x] [facility]/ []  Follow-up with PCP Additional Notes: Agrees with Mobile Unit.  Reason for Disposition  Urinating more frequently than usual (i.e., frequency)  Answer Assessment - Initial Assessment Questions 1. SYMPTOM: "What's the main symptom you're concerned about?" (e.g., frequency, incontinence)     Pain 2. ONSET: "When did the    start?"     2 days 3. PAIN: "Is there any pain?" If Yes, ask: "How bad is it?" (Scale: 1-10; mild, moderate, severe)     10 4. CAUSE: "What do you think is causing the symptoms?"     UTI 5. OTHER SYMPTOMS: "Do you have any other symptoms?" (e.g., blood in urine, fever, flank pain, pain with urination)     No 6. PREGNANCY: "Is there any chance you are pregnant?" "When was your last menstrual period?"     No  Protocols used: Urinary Symptoms-A-AH

## 2023-08-28 NOTE — Discharge Instructions (Signed)
Take the Septra double strength as directed for the next 7 days.  Urine has been sent for culture.  Return for any new or worse symptoms.  Labs today look good.

## 2023-08-28 NOTE — ED Triage Notes (Signed)
Pt started with frequent urination and hurts when she voids for 2 days. Pt did take an old antibiotic she had ,has had 2 doses.

## 2023-08-29 LAB — URINE CULTURE: Culture: NO GROWTH

## 2023-09-13 ENCOUNTER — Ambulatory Visit
Admission: RE | Admit: 2023-09-13 | Discharge: 2023-09-13 | Disposition: A | Discharge status: Home / Self Care | Payer: Medicare Other | Source: Ambulatory Visit | Attending: Nurse Practitioner | Admitting: Nurse Practitioner

## 2023-09-13 DIAGNOSIS — Z1231 Encounter for screening mammogram for malignant neoplasm of breast: Secondary | ICD-10-CM

## 2023-09-14 DIAGNOSIS — R300 Dysuria: Secondary | ICD-10-CM | POA: Diagnosis not present

## 2023-09-14 DIAGNOSIS — N3000 Acute cystitis without hematuria: Secondary | ICD-10-CM | POA: Diagnosis not present

## 2023-09-21 ENCOUNTER — Other Ambulatory Visit: Payer: Self-pay | Admitting: Nurse Practitioner

## 2023-09-21 DIAGNOSIS — I10 Essential (primary) hypertension: Secondary | ICD-10-CM

## 2023-09-21 DIAGNOSIS — R232 Flushing: Secondary | ICD-10-CM

## 2023-09-25 ENCOUNTER — Ambulatory Visit (HOSPITAL_COMMUNITY): Payer: Medicare Other | Admitting: Physician Assistant

## 2023-09-25 ENCOUNTER — Encounter (HOSPITAL_COMMUNITY): Payer: Self-pay | Admitting: Physician Assistant

## 2023-09-25 DIAGNOSIS — G2401 Drug induced subacute dyskinesia: Secondary | ICD-10-CM

## 2023-09-25 DIAGNOSIS — G479 Sleep disorder, unspecified: Secondary | ICD-10-CM | POA: Diagnosis not present

## 2023-09-25 DIAGNOSIS — F251 Schizoaffective disorder, depressive type: Secondary | ICD-10-CM | POA: Diagnosis not present

## 2023-09-25 DIAGNOSIS — F333 Major depressive disorder, recurrent, severe with psychotic symptoms: Secondary | ICD-10-CM

## 2023-09-25 DIAGNOSIS — F0390 Unspecified dementia without behavioral disturbance: Secondary | ICD-10-CM

## 2023-09-25 MED ORDER — AUSTEDO XR 24 MG PO TB24
24.0000 mg | ORAL_TABLET | Freq: Every day | ORAL | 2 refills | Status: DC
Start: 2023-09-25 — End: 2023-11-06

## 2023-09-25 MED ORDER — BELSOMRA 5 MG PO TABS: 5.0000 mg | ORAL_TABLET | Freq: Every evening | ORAL | 1 refills | Status: DC | PRN

## 2023-09-25 MED ORDER — QUETIAPINE FUMARATE 400 MG PO TABS: 400.0000 mg | ORAL_TABLET | Freq: Every day | ORAL | 2 refills | Status: DC

## 2023-09-25 MED ORDER — FLUOXETINE HCL 40 MG PO CAPS: 40.0000 mg | ORAL_CAPSULE | Freq: Every day | ORAL | 2 refills | Status: DC

## 2023-09-25 MED ORDER — DONEPEZIL HCL 5 MG PO TABS: 5.0000 mg | ORAL_TABLET | Freq: Every day | ORAL | 2 refills | Status: DC

## 2023-09-25 NOTE — Progress Notes (Signed)
BH MD/PA/NP OP Progress Note  09/25/2023 4:05 PM [name]  MRN:  [id number]  Chief Complaint:  Chief Complaint  Patient presents with   Follow-up   Medication Refill   HPI:   Jody [name] is a 52 year old, African-American female with a past psychiatric history significant for schizoaffective disorder (depressed type), major depressive disorder, generalized anxiety disorder, sleep disturbances, and major neurocognitive disorder who presents to [facility], accompanied by [name], [phone]), for follow up and medication management. Patient is currently being managed on the following psychiatric medications:  Seroquel 400 mg at bedtime Prozac 40 mg daily Belsomra 5 mg at bedtime Austedo XR 24 mg daily Donepezil 5 mg daily  Patient presents to the encounter reporting no issues or concerns regarding her current medication regimen.  Patient denies experiencing any adverse side effects and denies the need for dosage adjustments at this time.  Patient denies overt depressive symptoms and further denies anxiety.  Patient denies any new stressors at this time.  A PHQ-9 screen was performed with the patient scoring a 12.  A GAD-7 screen was also performed with the patient scoring a 9.  Patient is alert and oriented x 4, calm, cooperative, and fully engaged in conversation during the encounter.  Patient endorses good mood.  Patient denies suicidal or homicidal ideations.  She further denies auditory or visual hallucinations and does not appear to be responding to internal/external stimuli.  Patient endorses fair sleep and receives on average 4 hours of sleep per night.  Patient states that she occasionally naps during the day.  Patient reports that she will often sleep in "spurts."  She reports that when she takes her sleep aid medication, she does not fall asleep right away.  Patient endorses good appetite and eats on average 3  meals per day.  Patient denies alcohol consumption, tobacco use, or illicit drug use.  Visit Diagnosis:    ICD-10-CM   1. Major neurocognitive disorder (HCC)  F03.90 donepezil (ARICEPT) 5 MG tablet    FLUoxetine (PROZAC) 40 MG capsule    2. Sleep disturbances  G47.9 Suvorexant (BELSOMRA) 5 MG TABS    3. Schizoaffective disorder, depressive type (HCC)  F25.1 QUEtiapine (SEROQUEL) 400 MG tablet    Deutetrabenazine ER (AUSTEDO XR) 24 MG TB24    4. MDD (major depressive disorder), recurrent, severe, with psychosis (HCC)  F33.3 FLUoxetine (PROZAC) 40 MG capsule    5. Tardive dyskinesia  G24.01 Deutetrabenazine ER (AUSTEDO XR) 24 MG TB24      Past Psychiatric History:  Schizophrenia - diagnosed roughly 7 years ago when patient was attending [facility] Patient was diagnosed with schizoaffective disorder (depressive type) when admitted to [facility] from 04/08/2022 - 04/12/2022.  In addition to being diagnosed with schizoaffective disorder, patient was treated for insomnia, generalized anxiety disorder, and tardive dyskinesia.   **Onset of dementia, major neurocognitive disorder, due to Alzheimer's disease, without behavioral disturbance, mild  Past Medical History:  Past Medical History:  Diagnosis Date   Anxiety and depression    Cystitis, interstitial    Elevated troponin    a. 09/2015: CP/elevated troponin up to 0.11 - unclear significance. CT neg for PE, LHC neg for CAD, normal echo.   Hypertension    Microcytic anemia    Noted on labs   Panic attack    Paranoid schizophrenia (HCC)    Tobacco abuse     Past Surgical History:  Procedure Laterality Date  CARDIAC CATHETERIZATION N/A 09/29/2015   Procedure: Left Heart Cath and Coronary Angiography;  Surgeon: Corky Crafts, MD;  Location: [facility] INVASIVE CV LAB;  Service: Cardiovascular;  Laterality: N/A;   TUBAL LIGATION      Family Psychiatric History:  Grandmother (maternal) - unsure of the  diagnosis but states that her grandmother had mental health issues Uncle (maternal) - Schizophrenia and bipolar disorder, he is currently taking medications but is unsure of the type.   Family history of suicide: Patient denies Family history of homicide: Patient denies Family history of substance abuse: Per patient's sister, alcohol abuse runs in the family  Family History:  Family History  Problem Relation Age of Onset   Hypertension Mother    Diabetes Father    Hypertension Sister    Lupus Sister    Heart disease Maternal Grandfather    Lupus Other    BRCA 1/2 Neg Hx    Breast cancer Neg Hx     Social History:  Social History   Socioeconomic History   Marital status: Single    Spouse name: Not on file   Number of children: 5   Years of education: 12   Highest education level: Not on file  Occupational History   Occupation: Disabled  Tobacco Use   Smoking status: Former    Current packs/day: 0.00    Average packs/day: 0.3 packs/day for 4.0 years (1.0 ttl pk-yrs)    Types: Cigarettes    Start date: 05/01/2012    Quit date: 05/01/2016    Years since quitting: 7.4   Smokeless tobacco: Never  Vaping Use   Vaping status: Never Used  Substance and Sexual Activity   Alcohol use: Not Currently    Alcohol/week: 0.0 standard drinks of alcohol    Comment: Quit 2017   Drug use: No   Sexual activity: Not Currently    Birth control/protection: Condom  Other Topics Concern   Not on file  Social History Narrative   Lives alone   Caffeine use: Soda- daily   Right-handed   Social Drivers of Health   Financial Resource Strain: Low Risk  (12/22/2022)   Overall Financial Resource Strain (CARDIA)    Difficulty of Paying Living Expenses: Not hard at all  Food Insecurity: No Food Insecurity (12/22/2022)   Hunger Vital Sign    Worried About Running Out of Food in the Last Year: Never true    Ran Out of Food in the Last Year: Never true  Transportation Needs: No Transportation  Needs (12/22/2022)   PRAPARE - Administrator, Civil Service (Medical): No    Lack of Transportation (Non-Medical): No  Physical Activity: Sufficiently Active (12/22/2022)   Exercise Vital Sign    Days of Exercise per Week: 3 days    Minutes of Exercise per Session: 90 min  Stress: No Stress Concern Present (12/22/2022)   [employer] of Occupational Health - Occupational Stress Questionnaire    Feeling of Stress : Only a little  Social Connections: Not on file    Allergies:  Allergies  Allergen Reactions   Penicillins Itching and Rash    Metabolic Disorder Labs: Lab Results  Component Value Date   HGBA1C 5.9 (A) 05/04/2023   MPG 114.02 04/07/2022   MPG 117 09/28/2015   Lab Results  Component Value Date   PROLACTIN 9.7 12/01/2022   PROLACTIN 17.2 04/07/2022   Lab Results  Component Value Date   CHOL 135 05/04/2023   TRIG 138 05/04/2023  HDL 39 (L) 05/04/2023   CHOLHDL 3.5 05/04/2023   VLDL 56 (H) 04/09/2022   LDLCALC 72 05/04/2023   LDLCALC 191 (H) 10/10/2022   Lab Results  Component Value Date   TSH 5.855 (H) 04/07/2022   TSH 1.560 06/28/2016    Therapeutic Level Labs: No results found for: "LITHIUM" Lab Results  Component Value Date   VALPROATE 118 (H) 04/26/2016   VALPROATE 136 (H) 04/26/2016   No results found for: "CBMZ"  Current Medications: Current Outpatient Medications  Medication Sig Dispense Refill   atorvastatin (LIPITOR) 10 MG tablet Take 1 tablet (10 mg total) by mouth daily. 90 tablet 3   cloNIDine (CATAPRES) 0.1 MG tablet Take 1 tablet (0.1 mg total) by mouth at bedtime. 90 tablet 2   Deutetrabenazine ER (AUSTEDO XR) 24 MG TB24 Take 24 mg by mouth daily. 30 tablet 2   donepezil (ARICEPT) 5 MG tablet Take 1 tablet (5 mg total) by mouth at bedtime. 30 tablet 2   ferrous sulfate (FEROSUL) 325 (65 FE) MG tablet Take 1 tablet (325 mg total) by mouth daily with breakfast. 90 tablet 3   FLUoxetine (PROZAC) 40 MG capsule Take 1  capsule (40 mg total) by mouth daily. 90 capsule 2   gabapentin (NEURONTIN) 100 MG capsule TAKE 1 CAPSULE BY MOUTH TWICE A DAY  ( IN THE MORNING & IN THE EVENING ) 56 capsule 1   hydrochlorothiazide (HYDRODIURIL) 25 MG tablet TAKE 1 TABLET BY MOUTH DAILY 28 tablet 2   hydrOXYzine (ATARAX) 50 MG tablet Take 1 tablet (50 mg total) by mouth every 8 (eight) hours as needed for anxiety. 42 tablet 0   hydrOXYzine (ATARAX) 50 MG tablet Take 1 tablet (50 mg total) by mouth every 8 (eight) hours as needed for anxiety. 42 tablet 0   QUEtiapine (SEROQUEL) 400 MG tablet Take 1 tablet (400 mg total) by mouth at bedtime. 30 tablet 2   ramelteon (ROZEREM) 8 MG tablet Take 1 tablet (8 mg total) by mouth at bedtime. Patient can take an additional dose (1 tablet, 8 mg total) if patient continues to have issues with sleep. 30 tablet 2   Suvorexant (BELSOMRA) 5 MG TABS Take 1 tablet (5 mg total) by mouth at bedtime as needed. 30 tablet 1   No current facility-administered medications for this visit.     Musculoskeletal: Strength & Muscle Tone: within normal limits Gait & Station: normal Patient leans: N/A  Psychiatric Specialty Exam: Review of Systems  Psychiatric/Behavioral:  Positive for sleep disturbance. Negative for decreased concentration, dysphoric mood, hallucinations, self-injury and suicidal ideas. The patient is not nervous/anxious and is not hyperactive.     Blood pressure 137/82, pulse 91, temperature 98.3 F (36.8 C), temperature source Oral, height 5\' 4"  (1.626 m), weight 199 lb 3.2 oz (90.4 kg), SpO2 96%.Body mass index is 34.19 kg/m.  General Appearance: Casual  Eye Contact:  Good  Speech:  Clear and Coherent and Normal Rate  Volume:  Normal  Mood:  Euthymic  Affect:  Appropriate  Thought Process:  Coherent, Goal Directed, and Descriptions of Associations: Intact  Orientation:  Full (Time, Place, and Person)  Thought Content: WDL and Paranoid Ideation   Suicidal Thoughts:  No   Homicidal Thoughts:  No  Memory:  Immediate;   Good Recent;   Fair Remote;   Fair  Judgement:  Good  Insight:  Good  Psychomotor Activity:  TD  Concentration:  Concentration: Good and Attention Span: Good  Recall:  Good  Fund  of Knowledge: Fair  Language: Good  Akathisia:  No  Handed:  Right  AIMS (if indicated): done  Assets:  Communication Skills Desire for Improvement Housing Social Support  ADL's:  Intact  Cognition: Impaired,  Mild  Sleep:  Fair   Screenings: Geneticist, molecular Office Visit from 12/08/2022 in [facility] Clinical Support from 11/21/2022 in [facility] Clinical Support from 11/07/2022 in [facility] Office Visit from 10/27/2022 in [facility]  AIMS Total Score 8 28 24 17 4       AUDIT    Flowsheet Row [facility]) [facility] (Discharged[facility] Ad[facility] (Discharged[facility]2014 [facility]  Alcohol Use Disorder Identification Test Final Score (AUDIT) 9 4 20       GAD-7    Flowsheet Row Clinical Support from 09/25/2023 in [facility] Clinical Support from 08/15/2023 in [facility] Clinical Support from 07/04/2023 in [facility] Clinical Support from 05/23/2023 in [facility] Office Visit from 05/04/2023 in [facility] [place]  Total GAD-7 Score 9 8 19 17 17       Mini-Mental    Flowsheet Row Office Visit from 01/31/2017 in [facility] Office Visit from 11/27/2016 in [facility] Office Visit from 06/28/2016 in [facility] Office Visit from 05/25/2016 in [facility] [place] Office Visit from 03/11/2015 in [facility]  Total Score (max 30 points ) 25 11 18 15 26       PHQ2-9    Flowsheet Row Clinical Support from 09/25/2023 in [facility] Clinical Support from 08/15/2023 in [facility] Clinical Support from 07/04/2023 in [facility] Clinical Support from 05/23/2023 in [facility] Office Visit from 05/04/2023 in [facility] [place]. [facility]  PHQ-2 Total Score 2 2 6 3 4   PHQ-9 Total Score 12 13 21 7 14       Flowsheet Row Clinical Support from 09/25/2023 in [facility] from 08/28/2023 in [facility] Clinical Support from 08/15/2023 in [facility]  C-SSRS RISK CATEGORY Moderate Risk No Risk Moderate Risk        Assessment and Plan:   Nicloe Watt is a 52 year old, African-American female with a past psychiatric history significant for schizoaffective disorder (depressed type), major depressive disorder, generalized anxiety disorder, sleep disturbances, and major neurocognitive disorder who presents to [facility], accompanied by [name], [phone]), for follow up and medication management.  Patient presents today encounter reporting no issues or concerns regarding her current medication regimen.  Patient denies overt depressive symptoms and further denies anxiety at this time.  She does express that her sleep continues to be impaired.  She reports that she receives on average 4 hours of sleep per night and will often nap during the  day.  Despite her issues with sleep, patient endorses stability  on her current medication regimen.  Patient would like to continue taking her medications as prescribed.  Patient's medications to be e-prescribed to pharmacy of choice.  Collaboration of Care: Collaboration of Care: Medication Management AEB provider managing patient's psychiatric medications, Primary Care Provider AEB patient being seen by a primary care provider at [facility], Psychiatrist AEB patient being seen by mental health provider at this facility, and Referral or follow-up with counselor/therapist AEB patient being seen by a licensed clinical social worker at this facility  Patient/Guardian was advised Release of Information must be obtained prior to any record release in order to collaborate their care with an outside provider. Patient/Guardian was advised if they have not already done so to contact the registration department to sign all necessary forms in order for [place] to release information regarding their care.   Consent: Patient/Guardian gives verbal consent for treatment and assignment of benefits for services provided during this visit. Patient/Guardian expressed understanding and agreed to proceed.   1. Major neurocognitive disorder (HCC)  - donepezil (ARICEPT) 5 MG tablet; Take 1 tablet (5 mg total) by mouth at bedtime.  Dispense: 30 tablet; Refill: 2 - FLUoxetine (PROZAC) 40 MG capsule; Take 1 capsule (40 mg total) by mouth daily.  Dispense: 90 capsule; Refill: 2  2. Sleep disturbances  - Suvorexant (BELSOMRA) 5 MG TABS; Take 1 tablet (5 mg total) by mouth at bedtime as needed.  Dispense: 30 tablet; Refill: 1  3. Schizoaffective disorder, depressive type (HCC)  - QUEtiapine (SEROQUEL) 400 MG tablet; Take 1 tablet (400 mg total) by mouth at bedtime.  Dispense: 30 tablet; Refill: 2 - Deutetrabenazine ER (AUSTEDO XR) 24 MG TB24; Take 24 mg by mouth daily.  Dispense: 30 tablet; Refill: 2  4.  MDD (major depressive disorder), recurrent, severe, with psychosis (HCC)  - FLUoxetine (PROZAC) 40 MG capsule; Take 1 capsule (40 mg total) by mouth daily.  Dispense: 90 capsule; Refill: 2  5. Tardive dyskinesia  - Deutetrabenazine ER (AUSTEDO XR) 24 MG TB24; Take 24 mg by mouth daily.  Dispense: 30 tablet; Refill: 2  Patient to follow-up in 6 weeks Provider spent a total of 15 minutes with the patient/reviewing patient's chart  Meta Hatchet, PA 09/25/2023, 4:05 PM

## 2023-10-02 ENCOUNTER — Telehealth (HOSPITAL_COMMUNITY): Payer: Self-pay

## 2023-10-02 NOTE — Telephone Encounter (Signed)
Because this is a controlled substance provider will not be able to adjust the dose of this medication. She will need to discuss any changes to her medication with her primary psychiatric provider.

## 2023-11-06 ENCOUNTER — Ambulatory Visit (HOSPITAL_COMMUNITY): Payer: Medicare Other | Admitting: Physician Assistant

## 2023-11-06 ENCOUNTER — Encounter (HOSPITAL_COMMUNITY): Payer: Self-pay | Admitting: Physician Assistant

## 2023-11-06 DIAGNOSIS — G2401 Drug induced subacute dyskinesia: Secondary | ICD-10-CM

## 2023-11-06 DIAGNOSIS — G479 Sleep disorder, unspecified: Secondary | ICD-10-CM | POA: Diagnosis not present

## 2023-11-06 DIAGNOSIS — F251 Schizoaffective disorder, depressive type: Secondary | ICD-10-CM | POA: Diagnosis not present

## 2023-11-06 DIAGNOSIS — F333 Major depressive disorder, recurrent, severe with psychotic symptoms: Secondary | ICD-10-CM

## 2023-11-06 DIAGNOSIS — F0390 Unspecified dementia without behavioral disturbance: Secondary | ICD-10-CM | POA: Diagnosis not present

## 2023-11-06 MED ORDER — QUETIAPINE FUMARATE 400 MG PO TABS: 400.0000 mg | ORAL_TABLET | Freq: Every day | ORAL | 2 refills | Status: DC

## 2023-11-06 MED ORDER — BELSOMRA 10 MG PO TABS: 10.0000 mg | ORAL_TABLET | Freq: Every evening | ORAL | 0 refills | Status: DC | PRN

## 2023-11-06 MED ORDER — FLUOXETINE HCL 40 MG PO CAPS: 40.0000 mg | ORAL_CAPSULE | Freq: Every day | ORAL | 2 refills | Status: DC

## 2023-11-06 MED ORDER — AUSTEDO XR 24 MG PO TB24
24.0000 mg | ORAL_TABLET | Freq: Every day | ORAL | 2 refills | Status: DC
Start: 2023-11-06 — End: 2024-01-08

## 2023-11-06 MED ORDER — DONEPEZIL HCL 5 MG PO TABS: 5.0000 mg | ORAL_TABLET | Freq: Every day | ORAL | 2 refills | Status: DC

## 2023-11-06 NOTE — Progress Notes (Signed)
 BH MD/PA/NP OP Progress Note  11/06/2023 6:13 PM RIKIA SUKHU  MRN:  [id number]  Chief Complaint:  Chief Complaint  Patient presents with   Follow-up   Medication Management   HPI:   Jody [name] is a 53 year old, African-American female with a past psychiatric history significant for schizoaffective disorder (depressed type), major depressive disorder, generalized anxiety disorder, sleep disturbances, and major neurocognitive disorder who presents to [facility] for follow up and medication management. Patient is currently being managed on the following psychiatric medications:  Seroquel  400 mg at bedtime Prozac  40 mg daily Belsomra  5 mg at bedtime Austedo  XR 24 mg daily Donepezil  5 mg daily  Patient presents to the encounter continuing to endorse issues with sleep.  She reports that she takes her Belsomra  at 9:00 PM but is unable to sleep until 1:00 AM.  Patient reports that she receives roughly 3 to 4 hours of sleep per night.  Patient reports no other issues or concerns regarding her other medications.  She reports that she continues to take her medications regularly.  Patient denies overt depressive symptoms but does continue to express some anxiety.  Patient rates her anxiety at 3 out of 10 but denies any specific stressors at this time.  A PHQ-9 screen was performed with the patient scoring a 16.  A GAD-7 screen was also performed with the patient scoring a 16.  Patient is alert and oriented x 4, calm, cooperative, and fully engaged in conversation during the encounter.  Patient endorses fair mood.  Patient exhibits euthymic mood with appropriate affect.  Patient denies suicidal or homicidal ideations.  She further denies auditory or visual hallucinations and does not appear to be responding to internal/external stimuli.  Patient endorses poor sleep and receives on average 4 hours of sleep per night.  Patient endorses good appetite and  eats on average 3 meals per day.  Patient denies alcohol consumption or illicit drug use.  Patient endorses tobacco use and smokes on average 5 to 6 cigarettes/day.  Visit Diagnosis:    ICD-10-CM   1. Sleep disturbances  G47.9 Suvorexant  (BELSOMRA ) 10 MG TABS    2. Major neurocognitive disorder (HCC)  F03.90 donepezil  (ARICEPT ) 5 MG tablet    FLUoxetine  (PROZAC ) 40 MG capsule    3. Schizoaffective disorder, depressive type (HCC)  F25.1 QUEtiapine  (SEROQUEL ) 400 MG tablet    Deutetrabenazine  ER (AUSTEDO  XR) 24 MG TB24    4. MDD (major depressive disorder), recurrent, severe, with psychosis (HCC)  F33.3 FLUoxetine  (PROZAC ) 40 MG capsule    5. Tardive dyskinesia  G24.01 Deutetrabenazine  ER (AUSTEDO  XR) 24 MG TB24      Past Psychiatric History:  Schizophrenia - diagnosed roughly 7 years ago when patient was attending [facility] Patient was diagnosed with schizoaffective disorder (depressive type) when admitted to [facility] from 04/08/2022 - 04/12/2022.  In addition to being diagnosed with schizoaffective disorder, patient was treated for insomnia, generalized anxiety disorder, and tardive dyskinesia.   **Onset of dementia, major neurocognitive disorder, due to Alzheimer's disease, without behavioral disturbance, mild  Past Medical History:  Past Medical History:  Diagnosis Date   Anxiety and depression    Cystitis, interstitial    Elevated troponin    a. 09/2015: CP/elevated troponin up to 0.11 - unclear significance. CT neg for PE, LHC neg for CAD, normal echo.   Hypertension    Microcytic anemia    Noted on labs   Panic attack  Paranoid schizophrenia (HCC)    Tobacco abuse     Past Surgical History:  Procedure Laterality Date   CARDIAC CATHETERIZATION N/A 09/29/2015   Procedure: Left Heart Cath and Coronary Angiography;  Surgeon: Candyce GORMAN Reek, MD;  Location: [facility] INVASIVE CV LAB;  Service: Cardiovascular;  Laterality: N/A;   TUBAL LIGATION       Family Psychiatric History:  Grandmother (maternal) - unsure of the diagnosis but states that her grandmother had mental health issues Uncle (maternal) - Schizophrenia and bipolar disorder, he is currently taking medications but is unsure of the type.   Family history of suicide: Patient denies Family history of homicide: Patient denies Family history of substance abuse: Per patient's sister, alcohol abuse runs in the family  Family History:  Family History  Problem Relation Age of Onset   Hypertension Mother    Diabetes Father    Hypertension Sister    Lupus Sister    Heart disease Maternal Grandfather    Lupus Other    BRCA 1/2 Neg Hx    Breast cancer Neg Hx     Social History:  Social History   Socioeconomic History   Marital status: Single    Spouse name: Not on file   Number of children: 5   Years of education: 12   Highest education level: Not on file  Occupational History   Occupation: Disabled  Tobacco Use   Smoking status: Former    Current packs/day: 0.00    Average packs/day: 0.3 packs/day for 4.0 years (1.0 ttl pk-yrs)    Types: Cigarettes    Start date: 05/01/2012    Quit date: 05/01/2016    Years since quitting: 7.5   Smokeless tobacco: Never  Vaping Use   Vaping status: Never Used  Substance and Sexual Activity   Alcohol use: Not Currently    Alcohol/week: 0.0 standard drinks of alcohol    Comment: Quit 2017   Drug use: No   Sexual activity: Not Currently    Birth control/protection: Condom  Other Topics Concern   Not on file  Social History Narrative   Lives alone   Caffeine use: Soda- daily   Right-handed   Social Drivers of Health   Financial Resource Strain: Low Risk  (12/22/2022)   Overall Financial Resource Strain (CARDIA)    Difficulty of Paying Living Expenses: Not hard at all  Food Insecurity: No Food Insecurity (12/22/2022)   Hunger Vital Sign    Worried About Running Out of Food in the Last Year: Never true    Ran Out of  Food in the Last Year: Never true  Transportation Needs: No Transportation Needs (12/22/2022)   PRAPARE - Administrator, Civil Service (Medical): No    Lack of Transportation (Non-Medical): No  Physical Activity: Sufficiently Active (12/22/2022)   Exercise Vital Sign    Days of Exercise per Week: 3 days    Minutes of Exercise per Session: 90 min  Stress: No Stress Concern Present (12/22/2022)   [employer] of Occupational Health - Occupational Stress Questionnaire    Feeling of Stress : Only a little  Social Connections: Not on file    Allergies:  Allergies  Allergen Reactions   Penicillins Itching and Rash    Metabolic Disorder Labs: Lab Results  Component Value Date   HGBA1C 5.9 (A) 05/04/2023   MPG 114.02 04/07/2022   MPG 117 09/28/2015   Lab Results  Component Value Date   PROLACTIN 9.7 12/01/2022   PROLACTIN 17.2  04/07/2022   Lab Results  Component Value Date   CHOL 135 05/04/2023   TRIG 138 05/04/2023   HDL 39 (L) 05/04/2023   CHOLHDL 3.5 05/04/2023   VLDL 56 (H) 04/09/2022   LDLCALC 72 05/04/2023   LDLCALC 191 (H) 10/10/2022   Lab Results  Component Value Date   TSH 5.855 (H) 04/07/2022   TSH 1.560 06/28/2016    Therapeutic Level Labs: No results found for: LITHIUM Lab Results  Component Value Date   VALPROATE 118 (H) 04/26/2016   VALPROATE 136 (H) 04/26/2016   No results found for: CBMZ  Current Medications: Current Outpatient Medications  Medication Sig Dispense Refill   Suvorexant  (BELSOMRA ) 10 MG TABS Take 1 tablet (10 mg total) by mouth at bedtime as needed. 30 tablet 0   atorvastatin  (LIPITOR) 10 MG tablet Take 1 tablet (10 mg total) by mouth daily. 90 tablet 3   cloNIDine  (CATAPRES ) 0.1 MG tablet Take 1 tablet (0.1 mg total) by mouth at bedtime. 90 tablet 2   Deutetrabenazine  ER (AUSTEDO  XR) 24 MG TB24 Take 24 mg by mouth daily. 30 tablet 2   donepezil  (ARICEPT ) 5 MG tablet Take 1 tablet (5 mg total) by mouth at  bedtime. 30 tablet 2   ferrous sulfate  (FEROSUL) 325 (65 FE) MG tablet Take 1 tablet (325 mg total) by mouth daily with breakfast. 90 tablet 3   FLUoxetine  (PROZAC ) 40 MG capsule Take 1 capsule (40 mg total) by mouth daily. 90 capsule 2   gabapentin  (NEURONTIN ) 100 MG capsule TAKE 1 CAPSULE BY MOUTH TWICE A DAY  ( IN THE MORNING & IN THE EVENING ) 56 capsule 1   hydrochlorothiazide  (HYDRODIURIL ) 25 MG tablet TAKE 1 TABLET BY MOUTH DAILY 28 tablet 2   hydrOXYzine  (ATARAX ) 50 MG tablet Take 1 tablet (50 mg total) by mouth every 8 (eight) hours as needed for anxiety. 42 tablet 0   hydrOXYzine  (ATARAX ) 50 MG tablet Take 1 tablet (50 mg total) by mouth every 8 (eight) hours as needed for anxiety. 42 tablet 0   QUEtiapine  (SEROQUEL ) 400 MG tablet Take 1 tablet (400 mg total) by mouth at bedtime. 30 tablet 2   ramelteon  (ROZEREM ) 8 MG tablet Take 1 tablet (8 mg total) by mouth at bedtime. Patient can take an additional dose (1 tablet, 8 mg total) if patient continues to have issues with sleep. 30 tablet 2   No current facility-administered medications for this visit.     Musculoskeletal: Strength & Muscle Tone: within normal limits Gait & Station: normal Patient leans: N/A  Psychiatric Specialty Exam: Review of Systems  Psychiatric/Behavioral:  Positive for sleep disturbance. Negative for decreased concentration, dysphoric mood, hallucinations, self-injury and suicidal ideas. The patient is not nervous/anxious and is not hyperactive.     Blood pressure 138/80, pulse 84, height 5' 4 (1.626 m), weight 202 lb 6.4 oz (91.8 kg), SpO2 100%.Body mass index is 34.74 kg/m.  General Appearance: Casual  Eye Contact:  Good  Speech:  Clear and Coherent and Normal Rate  Volume:  Normal  Mood:  Euthymic  Affect:  Appropriate  Thought Process:  Coherent, Goal Directed, and Descriptions of Associations: Intact  Orientation:  Full (Time, Place, and Person)  Thought Content: WDL and Paranoid Ideation    Suicidal Thoughts:  No  Homicidal Thoughts:  No  Memory:  Immediate;   Good Recent;   Fair Remote;   Fair  Judgement:  Good  Insight:  Good  Psychomotor Activity:  TD  Concentration:  Concentration: Good and Attention Span: Good  Recall:  Good  Fund of Knowledge: Fair  Language: Good  Akathisia:  No  Handed:  Right  AIMS (if indicated): done  Assets:  Communication Skills Desire for Improvement Housing Social Support  ADL's:  Intact  Cognition: Impaired,  Mild  Sleep:  Fair   Screenings: Geneticist, Molecular Office Visit from 12/08/2022 in [facility] Clinical Support from 11/21/2022 in [facility] Clinical Support from 11/07/2022 in [facility] Office Visit from 10/27/2022 in [facility]  AIMS Total Score 8 28 24 17 4       AUDIT    Flowsheet Row [facility]) [facility] Ad[facility] (Discharged) [facility] Ad[facility] (Discharged) [facility] 07[facility]2014 [facility]  Alcohol Use Disorder Identification Test Final Score (AUDIT) 9 4 20       GAD-7    Flowsheet Row Clinical Support from 11/06/2023 in [facility] Clinical Support from 09/25/2023 in [facility] Clinical Support from 08/15/2023 in [facility] Clinical Support from 07/04/2023 in [facility] Clinical Support from 05/23/2023 in [facility]  Total GAD-7 Score 16 9 8 19 17       Mini-Mental    Flowsheet Row Office Visit from 01/31/2017 in [facility] Office Visit from 11/27/2016 in [facility] Office Visit from 06/28/2016 in [facility] Office Visit from 05/25/2016 in [facility] Office Visit from 03/11/2015 in [facility]  Total Score (max 30 points ) 25 11 18 15 26       PHQ2-9    Flowsheet Row Clinical Support from 11/06/2023 in [facility] Clinical Support from 09/25/2023 in [facility] Clinical Support from 08/15/2023 in [facility] Clinical Support from 07/04/2023 in [facility] Clinical Support from 05/23/2023 in [facility]  PHQ-2 Total Score 2 2 2 6 3   PHQ-9 Total Score 16 12 13 21 7       Flowsheet Row Clinical Support from 11/06/2023 in [facility] Clinical Support from 09/25/2023 in [facility] ED from 08/28/2023 in [facility] Emergency Department at [facility]  C-SSRS RISK CATEGORY Moderate Risk Moderate Risk No Risk        Assessment and Plan:   Jody [name] is a 53 year old, African-American female with a past psychiatric history significant for schizoaffective disorder (depressed type), major depressive disorder, generalized anxiety disorder, sleep disturbances, and major neurocognitive disorder who presents to [facility] for follow up and medication management.  Patient presents to the encounter expressing ongoing issues with her sleep.  She reports that she is unable to fall asleep until 1:00 AM even through the use of her Belsomra .  She reports that she receives roughly 3 to 4 hours of sleep per night.  Provider recommended increasing patient's Belsomra  from 5 mg to 10 mg at bedtime for the management of her sleep.  Patient was agreeable to recommendation.  Patient reports no other issues or  concerns regarding  her current medication regimen.  She endorses stability and will continue to take her other medications as prescribed.  Patient's medications to be e-prescribed to pharmacy of choice.  Provider instructed patient that labs would need to be obtained due to her use of Seroquel .  Patient reports that she will be seeing her primary care provider soon and will have the labs obtained by them.  Patient to have the following labs obtained: Complete metabolic panel, hemoglobin A1c, complete blood count with differential, and lipid profile.  Collaboration of Care: Collaboration of Care: Medication Management AEB provider managing patient's psychiatric medications, Primary Care Provider AEB patient being seen by a primary care provider at [facility], Psychiatrist AEB patient being seen by mental health provider at this facility, and Referral or follow-up with counselor/therapist AEB patient being seen by a licensed clinical social worker at this facility  Patient/Guardian was advised Release of Information must be obtained prior to any record release in order to collaborate their care with an outside provider. Patient/Guardian was advised if they have not already done so to contact the registration department to sign all necessary forms in order for us  to release information regarding their care.   Consent: Patient/Guardian gives verbal consent for treatment and assignment of benefits for services provided during this visit. Patient/Guardian expressed understanding and agreed to proceed.   1. Sleep disturbances  - Suvorexant  (BELSOMRA ) 10 MG TABS; Take 1 tablet (10 mg total) by mouth at bedtime as needed.  Dispense: 30 tablet; Refill: 0  2. Major neurocognitive disorder (HCC)  - donepezil  (ARICEPT ) 5 MG tablet; Take 1 tablet (5 mg total) by mouth at bedtime.  Dispense: 30 tablet; Refill: 2 - FLUoxetine  (PROZAC ) 40 MG capsule; Take 1 capsule (40 mg total) by mouth  daily.  Dispense: 90 capsule; Refill: 2  3. Schizoaffective disorder, depressive type (HCC)  - QUEtiapine  (SEROQUEL ) 400 MG tablet; Take 1 tablet (400 mg total) by mouth at bedtime.  Dispense: 30 tablet; Refill: 2 - Deutetrabenazine  ER (AUSTEDO  XR) 24 MG TB24; Take 24 mg by mouth daily.  Dispense: 30 tablet; Refill: 2  4. MDD (major depressive disorder), recurrent, severe, with psychosis (HCC)  - FLUoxetine  (PROZAC ) 40 MG capsule; Take 1 capsule (40 mg total) by mouth daily.  Dispense: 90 capsule; Refill: 2  5. Tardive dyskinesia  - Deutetrabenazine  ER (AUSTEDO  XR) 24 MG TB24; Take 24 mg by mouth daily.  Dispense: 30 tablet; Refill: 2  Patient to follow-up in 2 months Provider spent a total of 18 minutes with the patient/reviewing patient's chart  Reginia FORBES Bolster, PA 11/06/2023, 6:13 PM

## 2023-11-07 DIAGNOSIS — N3946 Mixed incontinence: Secondary | ICD-10-CM | POA: Diagnosis not present

## 2023-11-07 DIAGNOSIS — R350 Frequency of micturition: Secondary | ICD-10-CM | POA: Diagnosis not present

## 2023-11-09 ENCOUNTER — Encounter: Payer: Self-pay | Admitting: Nurse Practitioner

## 2023-11-09 ENCOUNTER — Ambulatory Visit: Payer: Medicare Other | Attending: Nurse Practitioner | Admitting: Nurse Practitioner

## 2023-11-09 VITALS — BP 117/85 | HR 93 | Resp 20 | Ht 64.0 in | Wt 202.8 lb

## 2023-11-09 DIAGNOSIS — Z713 Dietary counseling and surveillance: Secondary | ICD-10-CM | POA: Insufficient documentation

## 2023-11-09 DIAGNOSIS — F259 Schizoaffective disorder, unspecified: Secondary | ICD-10-CM | POA: Insufficient documentation

## 2023-11-09 DIAGNOSIS — E7800 Pure hypercholesterolemia, unspecified: Secondary | ICD-10-CM

## 2023-11-09 DIAGNOSIS — Z79899 Other long term (current) drug therapy: Secondary | ICD-10-CM | POA: Diagnosis not present

## 2023-11-09 DIAGNOSIS — D509 Iron deficiency anemia, unspecified: Secondary | ICD-10-CM | POA: Diagnosis not present

## 2023-11-09 DIAGNOSIS — I10 Essential (primary) hypertension: Secondary | ICD-10-CM

## 2023-11-09 DIAGNOSIS — R7303 Prediabetes: Secondary | ICD-10-CM | POA: Diagnosis not present

## 2023-11-09 DIAGNOSIS — Z6834 Body mass index (BMI) 34.0-34.9, adult: Secondary | ICD-10-CM | POA: Diagnosis not present

## 2023-11-09 DIAGNOSIS — D508 Other iron deficiency anemias: Secondary | ICD-10-CM

## 2023-11-09 LAB — POCT GLYCOSYLATED HEMOGLOBIN (HGB A1C): HbA1c, POC (controlled diabetic range): 6.3 % (ref 0.0–7.0)

## 2023-11-09 NOTE — Progress Notes (Signed)
 Assessment & Plan:  Jody [name] was seen today for medical management of chronic issues.  Diagnoses and all orders for this visit:  Prediabetes -     POCT glycosylated hemoglobin (Hb A1C) -     CMP14+EGFR GOAL: 5lb weight loss next visit  Primary hypertension -     CMP14+EGFR Continue all antihypertensives as prescribed.  Reminded to bring in blood pressure log for follow  up appointment.  RECOMMENDATIONS: DASH/Mediterranean Diets are healthier choices for HTN.    Other iron deficiency anemia -     CBC with Differential/Platelet  Hypercholesterolemia -     Lipid panel INSTRUCTIONS: Work on a low fat, heart healthy diet and participate in regular aerobic exercise program by working out at least 150 minutes per week; 5 days a week-30 minutes per day. Avoid red meat/beef/steak,  fried foods. junk foods, sodas, sugary drinks, unhealthy snacking, alcohol and smoking.  Drink at least 80 oz of water  per day and monitor your carbohydrate intake daily.      Patient has been counseled on age-appropriate routine health concerns for screening and prevention. These are reviewed and up-to-date. Referrals have been placed accordingly. Immunizations are up-to-date or declined.    Subjective:   Chief Complaint  Patient presents with   Medical Management of Chronic Issues    Jody [name] 53 y.o. female presents to office today for follow up to DM and HTN.  She is being followed by behavioral health for schizoaffective disorder.  They are requesting labs today due to her taking Seroquel .  She has not started the increased dose of Belsomra  10 mg at this time.   A1c slightly increased however still at goal.  Currently controlled with dietary modifications Lab Results  Component Value Date   HGBA1C 6.3 11/09/2023   Blood pressure is well-controlled with hydrochlorothiazide  25 mg daily.  She also takes clonidine  0.1 mg at bedtime for mood disorder. BP Readings from Last 3 Encounters:   11/09/23 117/85  11/06/23 138/80  09/25/23 137/82    Review of Systems  Constitutional:  Negative for fever, malaise/fatigue and weight loss.  HENT: Negative.  Negative for nosebleeds.   Eyes: Negative.  Negative for blurred vision, double vision and photophobia.  Respiratory: Negative.  Negative for cough and shortness of breath.   Cardiovascular: Negative.  Negative for chest pain, palpitations and leg swelling.  Gastrointestinal: Negative.  Negative for heartburn, nausea and vomiting.  Musculoskeletal: Negative.  Negative for myalgias.  Neurological: Negative.  Negative for dizziness, focal weakness, seizures and headaches.  Psychiatric/Behavioral: Negative.  Negative for suicidal ideas.     Past Medical History:  Diagnosis Date   Anxiety and depression    Cystitis, interstitial    Elevated troponin    a. 09/2015: CP/elevated troponin up to 0.11 - unclear significance. CT neg for PE, LHC neg for CAD, normal echo.   Hypertension    Microcytic anemia    Noted on labs   Panic attack    Paranoid schizophrenia (HCC)    Tobacco abuse     Past Surgical History:  Procedure Laterality Date   CARDIAC CATHETERIZATION N/A 09/29/2015   Procedure: Left Heart Cath and Coronary Angiography;  Surgeon: Candyce GORMAN Reek, MD;  Location: [facility] INVASIVE CV LAB;  Service: Cardiovascular;  Laterality: N/A;   TUBAL LIGATION      Family History  Problem Relation Age of Onset   Hypertension Mother    Diabetes Father    Hypertension Sister    Lupus Sister  Heart disease Maternal Grandfather    Lupus Other    BRCA 1/2 Neg Hx    Breast cancer Neg Hx     Social History Reviewed with no changes to be made today.   Outpatient Medications Prior to Visit  Medication Sig Dispense Refill   atorvastatin  (LIPITOR) 10 MG tablet Take 1 tablet (10 mg total) by mouth daily. 90 tablet 3   cloNIDine  (CATAPRES ) 0.1 MG tablet Take 1 tablet (0.1 mg total) by mouth at bedtime. 90 tablet 2    Deutetrabenazine  ER (AUSTEDO  XR) 24 MG TB24 Take 24 mg by mouth daily. 30 tablet 2   donepezil  (ARICEPT ) 5 MG tablet Take 1 tablet (5 mg total) by mouth at bedtime. 30 tablet 2   ferrous sulfate  (FEROSUL) 325 (65 FE) MG tablet Take 1 tablet (325 mg total) by mouth daily with breakfast. 90 tablet 3   FLUoxetine  (PROZAC ) 40 MG capsule Take 1 capsule (40 mg total) by mouth daily. 90 capsule 2   gabapentin  (NEURONTIN ) 100 MG capsule TAKE 1 CAPSULE BY MOUTH TWICE A DAY  ( IN THE MORNING & IN THE EVENING ) 56 capsule 1   hydrochlorothiazide  (HYDRODIURIL ) 25 MG tablet TAKE 1 TABLET BY MOUTH DAILY 28 tablet 2   QUEtiapine  (SEROQUEL ) 400 MG tablet Take 1 tablet (400 mg total) by mouth at bedtime. 30 tablet 2   Suvorexant  (BELSOMRA ) 10 MG TABS Take 1 tablet (10 mg total) by mouth at bedtime as needed. 30 tablet 0   hydrOXYzine  (ATARAX ) 50 MG tablet Take 1 tablet (50 mg total) by mouth every 8 (eight) hours as needed for anxiety. (Patient not taking: Reported on 11/09/2023) 42 tablet 0   hydrOXYzine  (ATARAX ) 50 MG tablet Take 1 tablet (50 mg total) by mouth every 8 (eight) hours as needed for anxiety. (Patient not taking: Reported on 11/09/2023) 42 tablet 0   ramelteon  (ROZEREM ) 8 MG tablet Take 1 tablet (8 mg total) by mouth at bedtime. Patient can take an additional dose (1 tablet, 8 mg total) if patient continues to have issues with sleep. (Patient not taking: Reported on 11/09/2023) 30 tablet 2   No facility-administered medications prior to visit.    Allergies  Allergen Reactions   Penicillins Itching and Rash       Objective:    BP 117/85 (BP Location: Left Arm, Patient Position: Sitting, Cuff Size: Normal)   Pulse 93   Resp 20   Ht 5' 4 (1.626 m)   Wt 202 lb 12.8 oz (92 kg)   LMP  (LMP Unknown)   SpO2 100%   BMI 34.81 kg/m  Wt Readings from Last 3 Encounters:  11/09/23 202 lb 12.8 oz (92 kg)  11/06/23 202 lb 6.4 oz (91.8 kg)  09/25/23 199 lb 3.2 oz (90.4 kg)    Physical Exam Vitals and  nursing note reviewed.  Constitutional:      Appearance: She is well-developed.  HENT:     Head: Normocephalic and atraumatic.  Cardiovascular:     Rate and Rhythm: Normal rate and regular rhythm.     Heart sounds: Normal heart sounds. No murmur heard.    No friction rub. No gallop.  Pulmonary:     Effort: Pulmonary effort is normal. No tachypnea or respiratory distress.     Breath sounds: Normal breath sounds. No decreased breath sounds, wheezing, rhonchi or rales.  Chest:     Chest wall: No tenderness.  Abdominal:     General: Bowel sounds are normal.  Palpations: Abdomen is soft.  Musculoskeletal:        General: Normal range of motion.     Cervical back: Normal range of motion.  Skin:    General: Skin is warm and dry.  Neurological:     Mental Status: She is alert and oriented to person, place, and time.     Coordination: Coordination normal.  Psychiatric:        Behavior: Behavior normal. Behavior is cooperative.        Judgment: Judgment normal.          Patient has been counseled extensively about nutrition and exercise as well as the importance of adherence with medications and regular follow-up. The patient was given clear instructions to go to ER or return to medical center if symptoms don't improve, worsen or new problems develop. The patient verbalized understanding.   Follow-up: Return in about 3 months (around 02/06/2024).   Haze LELON Servant, FNP-BC [facility] [place], [place] [phone]   11/09/2023, 11:25 AM

## 2023-11-10 LAB — CBC WITH DIFFERENTIAL/PLATELET
Basophils Absolute: 0 10*3/uL (ref 0.0–0.2)
Basos: 0 %
EOS (ABSOLUTE): 0.1 10*3/uL (ref 0.0–0.4)
Eos: 2 %
Hematocrit: 42.4 % (ref 34.0–46.6)
Hemoglobin: 13.4 g/dL (ref 11.1–15.9)
Immature Grans (Abs): 0 10*3/uL (ref 0.0–0.1)
Immature Granulocytes: 0 %
Lymphocytes Absolute: 2.1 10*3/uL (ref 0.7–3.1)
Lymphs: 43 %
MCH: 24 pg — ABNORMAL LOW (ref 26.6–33.0)
MCHC: 31.6 g/dL (ref 31.5–35.7)
MCV: 76 fL — ABNORMAL LOW (ref 79–97)
Monocytes Absolute: 0.3 10*3/uL (ref 0.1–0.9)
Monocytes: 6 %
Neutrophils Absolute: 2.4 10*3/uL (ref 1.4–7.0)
Neutrophils: 49 %
Platelets: 293 10*3/uL (ref 150–450)
RBC: 5.58 x10E6/uL — ABNORMAL HIGH (ref 3.77–5.28)
RDW: 14.9 % (ref 11.7–15.4)
WBC: 5 10*3/uL (ref 3.4–10.8)

## 2023-11-10 LAB — CMP14+EGFR
ALT: 31 [IU]/L (ref 0–32)
AST: 25 [IU]/L (ref 0–40)
Albumin: 4.8 g/dL (ref 3.9–4.9)
Alkaline Phosphatase: 114 [IU]/L (ref 44–121)
BUN/Creatinine Ratio: 9 (ref 9–23)
BUN: 8 mg/dL (ref 6–24)
Bilirubin Total: 0.2 mg/dL (ref 0.0–1.2)
CO2: 21 mmol/L (ref 20–29)
Calcium: 9.6 mg/dL (ref 8.7–10.2)
Chloride: 105 mmol/L (ref 96–106)
Creatinine, Ser: 0.94 mg/dL (ref 0.57–1.00)
Globulin, Total: 2.1 g/dL (ref 1.5–4.5)
Glucose: 105 mg/dL — ABNORMAL HIGH (ref 70–99)
Potassium: 3.9 mmol/L (ref 3.5–5.2)
Sodium: 143 mmol/L (ref 134–144)
Total Protein: 6.9 g/dL (ref 6.0–8.5)
eGFR: 74 mL/min/{1.73_m2} (ref >59)

## 2023-11-10 LAB — LIPID PANEL
Chol/HDL Ratio: 3.7 {ratio} (ref 0.0–4.4)
Cholesterol, Total: 152 mg/dL (ref 100–199)
HDL: 41 mg/dL (ref >39)
LDL Chol Calc (NIH): 89 mg/dL (ref 0–99)
Triglycerides: 120 mg/dL (ref 0–149)
VLDL Cholesterol Cal: 22 mg/dL (ref 5–40)

## 2023-11-28 DIAGNOSIS — Z961 Presence of intraocular lens: Secondary | ICD-10-CM | POA: Diagnosis not present

## 2023-11-28 DIAGNOSIS — H52203 Unspecified astigmatism, bilateral: Secondary | ICD-10-CM | POA: Diagnosis not present

## 2023-11-28 DIAGNOSIS — H26491 Other secondary cataract, right eye: Secondary | ICD-10-CM | POA: Diagnosis not present

## 2023-11-28 DIAGNOSIS — H5203 Hypermetropia, bilateral: Secondary | ICD-10-CM | POA: Diagnosis not present

## 2023-11-28 DIAGNOSIS — H43393 Other vitreous opacities, bilateral: Secondary | ICD-10-CM | POA: Diagnosis not present

## 2023-11-28 DIAGNOSIS — H04123 Dry eye syndrome of bilateral lacrimal glands: Secondary | ICD-10-CM | POA: Diagnosis not present

## 2023-11-28 DIAGNOSIS — H524 Presbyopia: Secondary | ICD-10-CM | POA: Diagnosis not present

## 2023-12-11 ENCOUNTER — Other Ambulatory Visit: Payer: Self-pay | Admitting: Nurse Practitioner

## 2023-12-11 ENCOUNTER — Other Ambulatory Visit (HOSPITAL_COMMUNITY): Payer: Self-pay | Admitting: Physician Assistant

## 2023-12-11 ENCOUNTER — Other Ambulatory Visit: Payer: Self-pay | Admitting: Family Medicine

## 2023-12-11 DIAGNOSIS — G479 Sleep disorder, unspecified: Secondary | ICD-10-CM

## 2023-12-11 DIAGNOSIS — R232 Flushing: Secondary | ICD-10-CM

## 2023-12-11 DIAGNOSIS — I10 Essential (primary) hypertension: Secondary | ICD-10-CM

## 2023-12-12 NOTE — Telephone Encounter (Signed)
 Message acknowledged and reviewed.

## 2023-12-25 ENCOUNTER — Ambulatory Visit: Payer: Medicare Other | Attending: Nurse Practitioner

## 2023-12-25 VITALS — Ht 64.0 in | Wt 198.0 lb

## 2023-12-25 DIAGNOSIS — Z0000 Encounter for general adult medical examination without abnormal findings: Secondary | ICD-10-CM

## 2023-12-25 DIAGNOSIS — Z1211 Encounter for screening for malignant neoplasm of colon: Secondary | ICD-10-CM

## 2023-12-25 NOTE — Progress Notes (Signed)
 Because this visit was a virtual/telehealth visit,  certain criteria was not obtained, such a blood pressure, CBG if applicable, and timed get up and go. Any medications not marked as "taking" were not mentioned during the medication reconciliation part of the visit. Any vitals not documented were not able to be obtained due to this being a telehealth visit or patient was unable to self-report a recent blood pressure reading due to a lack of equipment at home via telehealth. Vitals that have been documented are verbally provided by the patient.   Subjective:   Jody [name] is a 53 y.o. who presents for a Medicare Wellness preventive visit.  Visit Complete: Virtual I connected with  [name] on 12/25/23 by a audio enabled telemedicine application and verified that I am speaking with the correct person using two identifiers.  Patient Location: Home  Provider Location: Office/Clinic  I discussed the limitations of evaluation and management by telemedicine. The patient expressed understanding and agreed to proceed.  Vital Signs: Because this visit was a virtual/telehealth visit, some criteria may be missing or patient reported. Any vitals not documented were not able to be obtained and vitals that have been documented are patient reported.  VideoDeclined- This patient declined Librarian, academic. Therefore the visit was completed with audio only.  Persons Participating in Visit: Patient.  AWV Questionnaire: No: Patient Medicare AWV questionnaire was not completed prior to this visit.  Cardiac Risk Factors include: advanced age (>21men, >32 women);family history of premature cardiovascular disease;hypertension;obesity (BMI >30kg/m2);sedentary lifestyle     Objective:    Today's Vitals   12/25/23 1203  Weight: 198 lb (89.8 kg)  Height: 5\' 4"  (1.626 m)  PainSc: 0-No pain   Body mass index is 33.99 kg/m.     12/25/2023   12:05 PM 08/28/2023    11:19 AM 12/22/2022    2:30 PM 04/08/2022    6:17 PM 01/30/2022   11:49 PM 03/14/2019    9:00 AM 03/21/2017    9:24 AM  Advanced Directives  Does Patient Have a Medical Advance Directive? No No No  No No No  Would patient like information on creating a medical advance directive? No - Patient declined No - Patient declined   No - Patient declined No - Patient declined      Information is confidential and restricted. Go to Review Flowsheets to unlock data.    Current Medications (verified) Outpatient Encounter Medications as of 12/25/2023  Medication Sig   atorvastatin (LIPITOR) 10 MG tablet Take 1 tablet (10 mg total) by mouth daily.   cloNIDine (CATAPRES) 0.1 MG tablet Take 1 tablet (0.1 mg total) by mouth at bedtime.   Deutetrabenazine ER (AUSTEDO XR) 24 MG TB24 Take 24 mg by mouth daily.   donepezil (ARICEPT) 5 MG tablet Take 1 tablet (5 mg total) by mouth at bedtime.   ferrous sulfate (FEROSUL) 325 (65 FE) MG tablet Take 1 tablet (325 mg total) by mouth daily with breakfast.   FLUoxetine (PROZAC) 40 MG capsule Take 1 capsule (40 mg total) by mouth daily.   gabapentin (NEURONTIN) 100 MG capsule TAKE 1 CAPSULE BY MOUTH TWICE A DAY  ( IN THE MORNING & IN THE EVENING )   hydrochlorothiazide (HYDRODIURIL) 25 MG tablet TAKE 1 TABLET BY MOUTH DAILY   QUEtiapine (SEROQUEL) 400 MG tablet Take 1 tablet (400 mg total) by mouth at bedtime.   Suvorexant (BELSOMRA) 10 MG TABS TAKE 1 TABLET BY MOUTH AT BEDTIME AS NEEDED  No facility-administered encounter medications on file as of 12/25/2023.    Allergies (verified) Penicillins   History: Past Medical History:  Diagnosis Date   Anxiety and depression    Cystitis, interstitial    Elevated troponin    a. 09/2015: CP/elevated troponin up to 0.11 - unclear significance. CT neg for PE, LHC neg for CAD, normal echo.   Hypertension    Microcytic anemia    Noted on labs   Panic attack    Paranoid schizophrenia (HCC)    Tobacco abuse    Past  Surgical History:  Procedure Laterality Date   CARDIAC CATHETERIZATION N/A 09/29/2015   Procedure: Left Heart Cath and Coronary Angiography;  Surgeon: Corky Crafts, MD;  Location: [facility] INVASIVE CV LAB;  Service: Cardiovascular;  Laterality: N/A;   TUBAL LIGATION     Family History  Problem Relation Age of Onset   Hypertension Mother    Diabetes Father    Hypertension Sister    Lupus Sister    Heart disease Maternal Grandfather    Lupus Other    BRCA 1/2 Neg Hx    Breast cancer Neg Hx    Social History   Socioeconomic History   Marital status: Single    Spouse name: Not on file   Number of children: 5   Years of education: 12   Highest education level: Not on file  Occupational History   Occupation: Disabled  Tobacco Use   Smoking status: Former    Current packs/day: 0.00    Average packs/day: 0.3 packs/day for 4.0 years (1.0 ttl pk-yrs)    Types: Cigarettes    Start date: 05/01/2012    Quit date: 05/01/2016    Years since quitting: 7.6   Smokeless tobacco: Never  Vaping Use   Vaping status: Never Used  Substance and Sexual Activity   Alcohol use: Not Currently    Alcohol/week: 0.0 standard drinks of alcohol    Comment: Quit 2017   Drug use: No   Sexual activity: Not Currently    Birth control/protection: Condom  Other Topics Concern   Not on file  Social History Narrative   Lives alone   Caffeine use: Soda- daily   Right-handed   Social Drivers of Health   Financial Resource Strain: Low Risk  (12/25/2023)   Overall Financial Resource Strain (CARDIA)    Difficulty of Paying Living Expenses: Not hard at all  Food Insecurity: No Food Insecurity (12/25/2023)   Hunger Vital Sign    Worried About Running Out of Food in the Last Year: Never true    Ran Out of Food in the Last Year: Never true  Transportation Needs: No Transportation Needs (12/25/2023)   PRAPARE - Administrator, Civil Service (Medical): No    Lack of Transportation (Non-Medical): No   Physical Activity: Insufficiently Active (12/25/2023)   Exercise Vital Sign    Days of Exercise per Week: 1 day    Minutes of Exercise per Session: 20 min  Stress: No Stress Concern Present (12/25/2023)   [employer] of Occupational Health - Occupational Stress Questionnaire    Feeling of Stress : Not at all  Recent Concern: Stress - Stress Concern Present (11/09/2023)   [employer] of Occupational Health - Occupational Stress Questionnaire    Feeling of Stress : To some extent  Social Connections: Moderately Isolated (12/25/2023)   Social Connection and Isolation Panel [NHANES]    Frequency of Communication with Friends and Family: Once a week  Frequency of Social Gatherings with Friends and Family: Once a week    Attends Religious Services: 1 to 4 times per year    Active Member of [employer] or Organizations: Yes    Attends Banker Meetings: 1 to 4 times per year    Marital Status: Divorced    Tobacco Counseling Counseling given: Not Answered    Clinical Intake:  Pre-visit preparation completed: Yes  Pain : No/denies pain Pain Score: 0-No pain     BMI - recorded: 33.99 Nutritional Status: BMI > 30  Obese Nutritional Risks: None Diabetes: No  Lab Results  Component Value Date   HGBA1C 6.3 11/09/2023   HGBA1C 5.9 (A) 05/04/2023   HGBA1C 5.9 (H) 10/10/2022     How often do you need to have someone help you when you read instructions, pamphlets, or other written materials from your doctor or pharmacy?: 1 - Never  Interpreter Needed?: No  Information entered by :: Carrie Schoonmaker N. Yemariam Magar, LPN.   Activities of Daily Living     12/25/2023   12:07 PM  In your present state of health, do you have any difficulty performing the following activities:  Hearing? 0  Vision? 0  Difficulty concentrating or making decisions? 1  Comment Neurocognitive disorder  Walking or climbing stairs? 0  Dressing or bathing? 0  Doing errands, shopping? 0  Preparing  Food and eating ? N  Using the Toilet? N  In the past six months, have you accidently leaked urine? Y  Do you have problems with loss of bowel control? N  Managing your Medications? N  Managing your Finances? N  Housekeeping or managing your Housekeeping? N    Patient Care Team: Claiborne Rigg, NP as PCP - General (Nurse Practitioner) Berkley Harvey, MD as Referring Physician (Ophthalmology)  Indicate any recent Medical Services you may have received from other than [facility] providers in the past year (date may be approximate).     Assessment:   This is a routine wellness examination for [place].  Hearing/Vision screen Hearing Screening - Comments:: Denies hearing difficulties.   Vision Screening - Comments:: Wears rx glasses - up to date with routine eye exams with Dr. Bradley Ferris Sondra Barges    Goals Addressed             This Visit's Progress    My goal is to lose weight.         Depression Screen     12/25/2023   12:08 PM 11/09/2023    5:05 PM 11/06/2023    2:44 PM 09/25/2023   11:34 AM 08/15/2023    3:56 PM 07/04/2023    3:47 PM 05/23/2023    3:12 PM  PHQ 2/9 Scores  PHQ - 2 Score 2 2       PHQ- 9 Score 8 8          Information is confidential and restricted. Go to Review Flowsheets to unlock data.    Fall Risk     12/25/2023   12:06 PM 05/04/2023   10:45 AM 12/22/2022    2:31 PM 10/10/2022   10:32 AM 12/28/2021   11:24 AM  Fall Risk   Falls in the past year? 0 0 0 0 0  Number falls in past yr: 0 0 0 0 0  Injury with Fall? 0 0 0 0 0  Risk for fall due to : No Fall Risks No Fall Risks Medication side effect  No Fall Risks  Follow up Falls  prevention discussed;Falls evaluation completed Falls evaluation completed Falls prevention discussed;Education provided;Falls evaluation completed Falls evaluation completed     MEDICARE RISK AT HOME:  Medicare Risk at Home Any stairs in or around the home?: No If so, are there any without handrails?: No Home free of  loose throw rugs in walkways, pet beds, electrical cords, etc?: Yes Adequate lighting in your home to reduce risk of falls?: Yes Life alert?: No Use of a cane, walker or w/c?: No Grab bars in the bathroom?: No Shower chair or bench in shower?: No Elevated toilet seat or a handicapped toilet?: No  TIMED UP AND GO:  Was the test performed?  No  Cognitive Function: Patient has current diagnosis of cognitive impairment. Patient is followed by behavioral health for ongoing assessment. Patient declined to complete screening 6CIT or MMSE.      12/25/2023   12:07 PM 01/31/2017    8:45 AM 11/27/2016   11:39 AM 06/28/2016    3:46 PM 05/25/2016    5:03 PM  MMSE - Mini Mental State Exam  Not completed: Unable to complete      Orientation to time  5 0 2 2  Orientation to Place  4 2 3 3   Registration  3 2 3 3   Attention/ Calculation  2 0 0 0  Recall  2 0 1 2  Language- name 2 objects  2 2 2 2   Language- repeat  1 1 1  0  Language- follow 3 step command  3 2 3 3   Language- read & follow direction  1 1 1  0  Write a sentence  1 0 1 0  Copy design  1 1 1  0  Total score  25 11 18 15         12/25/2023   12:07 PM 12/22/2022    2:32 PM  6CIT Screen  What Year? 0 points 0 points  What month? 0 points 0 points  What time? 0 points 0 points  Count back from 20 0 points 0 points  Months in reverse 0 points 4 points  Repeat phrase 0 points 6 points  Total Score 0 points 10 points    Immunizations Immunization History  Administered Date(s) Administered   Influenza,inj,Quad PF,6+ Mos 07/05/2014, 09/22/2014   Moderna Sars-Covid-2 Vaccination 01/01/2020, 02/03/2020, 10/14/2020   Pneumococcal Polysaccharide-23 07/05/2014   Tdap 02/01/2017    Screening Tests Health Maintenance  Topic Date Due   Colonoscopy  Never done   COVID-19 Vaccine (4 - 2024-25 season) 06/03/2023   INFLUENZA VACCINE  12/31/2023 (Originally 05/03/2023)   Zoster Vaccines- Shingrix (1 of 2) 02/06/2024 (Originally [birth year]1992)    Medicare Annual Wellness (AWV)  12/24/2024   MAMMOGRAM  09/12/2025   DTaP/Tdap/Td (2 - Td or Tdap) 02/02/2027   Cervical Cancer Screening (HPV/Pap Cotest)  01/02/2028   Hepatitis C Screening  Completed   HIV Screening  Completed   Pneumococcal Vaccine 57-31 Years old  Aged Out   HPV VACCINES  Aged Out    Health Maintenance  Health Maintenance Due  Topic Date Due   Colonoscopy  Never done   COVID-19 Vaccine (4 - 2024-25 season) 06/03/2023   Health Maintenance Items Addressed: Referral sent to GI for colonoscopy  Additional Screening:  Vision Screening: Recommended annual ophthalmology exams for early detection of glaucoma and other disorders of the eye.  Dental Screening: Recommended annual dental exams for proper oral hygiene  Community Resource Referral / Chronic Care Management: CRR required this visit?  No   CCM  required this visit?  No     Plan:     I have personally reviewed and noted the following in the patient's chart:   Medical and social history Use of alcohol, tobacco or illicit drugs  Current medications and supplements including opioid prescriptions. Patient is not currently taking opioid prescriptions. Functional ability and status Nutritional status Physical activity Advanced directives List of other physicians Hospitalizations, surgeries, and ER visits in previous 12 months Vitals Screenings to include cognitive, depression, and falls Referrals and appointments  In addition, I have reviewed and discussed with patient certain preventive protocols, quality metrics, and best practice recommendations. A written personalized care plan for preventive services as well as general preventive health recommendations were provided to patient.     Mickeal Needy, LPN   [id number]   After Visit Summary: (Mail) Due to this being a telephonic visit, the after visit summary with patients personalized plan was offered to patient via mail   Notes: Please  refer to Routing Comments.

## 2023-12-25 NOTE — Patient Instructions (Addendum)
 Jody [name] , Thank you for taking time to come for your Medicare Wellness Visit. I appreciate your ongoing commitment to your health goals. Please review the following plan we discussed and let me know if I can assist you in the future.   Referrals/Orders/Follow-Ups/Clinician Recommendations: Yes; A referral was placed for a Screening Colonoscopy to [facility].  That office will call you to schedule a consult. Keep maintaining your health by keeping your appointments with Bertram Denver, NP and any specialists that you may see.  Call us if you need anything.  Have a great year!!!!  This is a list of the screening recommended for you and due dates:  Health Maintenance  Topic Date Due   Colon Cancer Screening  Never done   COVID-19 Vaccine (4 - 2024-25 season) 06/03/2023   Flu Shot  12/31/2023*   Zoster (Shingles) Vaccine (1 of 2) 02/06/2024*   Medicare Annual Wellness Visit  12/24/2024   Mammogram  09/12/2025   DTaP/Tdap/Td vaccine (2 - Td or Tdap) 02/02/2027   Pap with HPV screening  01/02/2028   Hepatitis C Screening  Completed   HIV Screening  Completed   Pneumococcal Vaccination  Aged Out   HPV Vaccine  Aged Out  *Topic was postponed. The date shown is not the original due date.    Advanced directives: (Declined) Advance directive discussed with you today. Even though you declined this today, please call our office should you change your mind, and we can give you the proper paperwork for you to fill out.  Next Medicare Annual Wellness Visit scheduled for next year: Yes

## 2024-01-08 ENCOUNTER — Other Ambulatory Visit (HOSPITAL_COMMUNITY): Payer: Self-pay | Admitting: Physician Assistant

## 2024-01-08 ENCOUNTER — Other Ambulatory Visit: Payer: Self-pay | Admitting: Nurse Practitioner

## 2024-01-08 ENCOUNTER — Encounter: Payer: Self-pay | Admitting: Internal Medicine

## 2024-01-08 ENCOUNTER — Ambulatory Visit (INDEPENDENT_AMBULATORY_CARE_PROVIDER_SITE_OTHER): Payer: Medicare Other | Admitting: Physician Assistant

## 2024-01-08 ENCOUNTER — Encounter (HOSPITAL_COMMUNITY): Payer: Self-pay | Admitting: Physician Assistant

## 2024-01-08 DIAGNOSIS — F0390 Unspecified dementia without behavioral disturbance: Secondary | ICD-10-CM

## 2024-01-08 DIAGNOSIS — F251 Schizoaffective disorder, depressive type: Secondary | ICD-10-CM

## 2024-01-08 DIAGNOSIS — Z862 Personal history of diseases of the blood and blood-forming organs and certain disorders involving the immune mechanism: Secondary | ICD-10-CM

## 2024-01-08 DIAGNOSIS — G479 Sleep disorder, unspecified: Secondary | ICD-10-CM | POA: Diagnosis not present

## 2024-01-08 DIAGNOSIS — G2401 Drug induced subacute dyskinesia: Secondary | ICD-10-CM | POA: Diagnosis not present

## 2024-01-08 DIAGNOSIS — F333 Major depressive disorder, recurrent, severe with psychotic symptoms: Secondary | ICD-10-CM

## 2024-01-08 DIAGNOSIS — E7800 Pure hypercholesterolemia, unspecified: Secondary | ICD-10-CM

## 2024-01-08 MED ORDER — DONEPEZIL HCL 5 MG PO TABS: 5.0000 mg | ORAL_TABLET | Freq: Every day | ORAL | 2 refills | Status: DC

## 2024-01-08 MED ORDER — FLUOXETINE HCL 40 MG PO CAPS: 40.0000 mg | ORAL_CAPSULE | Freq: Every day | ORAL | 2 refills | Status: DC

## 2024-01-08 MED ORDER — AUSTEDO XR 24 MG PO TB24
24.0000 mg | ORAL_TABLET | Freq: Every day | ORAL | 2 refills | Status: DC
Start: 2024-01-08 — End: 2024-04-22

## 2024-01-08 MED ORDER — QUETIAPINE FUMARATE 400 MG PO TABS: 400.0000 mg | ORAL_TABLET | Freq: Every day | ORAL | 2 refills | Status: DC

## 2024-01-08 MED ORDER — BELSOMRA 10 MG PO TABS: 1.0000 | ORAL_TABLET | Freq: Every evening | ORAL | 0 refills | Status: DC | PRN

## 2024-01-08 NOTE — Telephone Encounter (Signed)
 Message acknowledged and reviewed.

## 2024-01-08 NOTE — Progress Notes (Unsigned)
 BH MD/PA/NP OP Progress Note  01/08/2024 4:58 PM Jody [name]  MRN:  [id number]  Chief Complaint:  Chief Complaint  Patient presents with   Follow-up   Medication Refill   HPI:   Jody [name] is a 53 year old, African-American female with a past psychiatric history significant for schizoaffective disorder (depressed type), major depressive disorder, generalized anxiety disorder, sleep disturbances, and major neurocognitive disorder who presents to [facility] for follow up and medication management. Patient is currently being managed on the following psychiatric medications:  Seroquel 400 mg at bedtime Prozac 40 mg daily Belsomra 5 mg at bedtime Austedo XR 24 mg daily Donepezil 5 mg daily  ***  Patient is alert and oriented x 4, calm, cooperative, and fully engaged in conversation during the encounter. ***  Visit Diagnosis:    ICD-10-CM   1. Schizoaffective disorder, depressive type (HCC)  F25.1 QUEtiapine (SEROQUEL) 400 MG tablet    Deutetrabenazine ER (AUSTEDO XR) 24 MG TB24    2. Major neurocognitive disorder (HCC)  F03.90 donepezil (ARICEPT) 5 MG tablet    FLUoxetine (PROZAC) 40 MG capsule    3. MDD (major depressive disorder), recurrent, severe, with psychosis (HCC)  F33.3 FLUoxetine (PROZAC) 40 MG capsule    4. Tardive dyskinesia  G24.01 Deutetrabenazine ER (AUSTEDO XR) 24 MG TB24    5. Sleep disturbances  G47.9 Suvorexant (BELSOMRA) 10 MG TABS      Past Psychiatric History:  Schizophrenia - diagnosed roughly 7 years ago when patient was attending [facility] Patient was diagnosed with schizoaffective disorder (depressive type) when admitted to [facility] from 04/08/2022 - 04/12/2022.  In addition to being diagnosed with schizoaffective disorder, patient was treated for insomnia, generalized anxiety disorder, and tardive dyskinesia.   **Onset of dementia, major neurocognitive disorder, due to  Alzheimer's disease, without behavioral disturbance, mild  Past Medical History:  Past Medical History:  Diagnosis Date   Anxiety and depression    Cystitis, interstitial    Elevated troponin    a. 09/2015: CP/elevated troponin up to 0.11 - unclear significance. CT neg for PE, LHC neg for CAD, normal echo.   Hypertension    Microcytic anemia    Noted on labs   Panic attack    Paranoid schizophrenia (HCC)    Tobacco abuse     Past Surgical History:  Procedure Laterality Date   CARDIAC CATHETERIZATION N/A 09/29/2015   Procedure: Left Heart Cath and Coronary Angiography;  Surgeon: Corky Crafts, MD;  Location[facility] INVASIVE CV LAB;  Service: Cardiovascular;  Laterality: N/A;   TUBAL LIGATION      Family Psychiatric History:  Grandmother (maternal) - unsure of the diagnosis but states that her grandmother had mental health issues Uncle (maternal) - Schizophrenia and bipolar disorder, he is currently taking medications but is unsure of the type.   Family history of suicide: Patient denies Family history of homicide: Patient denies Family history of substance abuse: Per patient's sister, alcohol abuse runs in the family  Family History:  Family History  Problem Relation Age of Onset   Hypertension Mother    Diabetes Father    Hypertension Sister    Lupus Sister    Heart disease Maternal Grandfather    Lupus Other    BRCA 1/2 Neg Hx    Breast cancer Neg Hx     Social History:  Social History   Socioeconomic History   Marital status: Single    Spouse name: Not on file  Number of children: 5   Years of education: 12   Highest education level: Not on file  Occupational History   Occupation: Disabled  Tobacco Use   Smoking status: Former    Current packs/day: 0.00    Average packs/day: 0.3 packs/day for 4.0 years (1.0 ttl pk-yrs)    Types: Cigarettes    Start date: 05/01/2012    Quit date: 05/01/2016    Years since quitting: 7.6   Smokeless tobacco: Never   Vaping Use   Vaping status: Never Used  Substance and Sexual Activity   Alcohol use: Not Currently    Alcohol/week: 0.0 standard drinks of alcohol    Comment: Quit 2017   Drug use: No   Sexual activity: Not Currently    Birth control/protection: Condom  Other Topics Concern   Not on file  Social History Narrative   Lives alone   Caffeine use: Soda- daily   Right-handed   Social Drivers of Health   Financial Resource Strain: Low Risk  (12/25/2023)   Overall Financial Resource Strain (CARDIA)    Difficulty of Paying Living Expenses: Not hard at all  Food Insecurity: No Food Insecurity (12/25/2023)   Hunger Vital Sign    Worried About Running Out of Food in the Last Year: Never true    Ran Out of Food in the Last Year: Never true  Transportation Needs: No Transportation Needs (12/25/2023)   PRAPARE - Administrator, Civil Service (Medical): No    Lack of Transportation (Non-Medical): No  Physical Activity: Insufficiently Active (12/25/2023)   Exercise Vital Sign    Days of Exercise per Week: 1 day    Minutes of Exercise per Session: 20 min  Stress: No Stress Concern Present (12/25/2023)   [employer] of Occupational Health - Occupational Stress Questionnaire    Feeling of Stress : Not at all  Recent Concern: Stress - Stress Concern Present (11/09/2023)   [employer] of Occupational Health - Occupational Stress Questionnaire    Feeling of Stress : To some extent  Social Connections: Moderately Isolated (12/25/2023)   Social Connection and Isolation Panel [NHANES]    Frequency of Communication with Friends and Family: Once a week    Frequency of Social Gatherings with Friends and Family: Once a week    Attends Religious Services: 1 to 4 times per year    Active Member of [employer] or Organizations: Yes    Attends Banker Meetings: 1 to 4 times per year    Marital Status: Divorced    Allergies:  Allergies  Allergen Reactions   Penicillins  Itching and Rash    Metabolic Disorder Labs: Lab Results  Component Value Date   HGBA1C 6.3 11/09/2023   MPG 114.02 04/07/2022   MPG 117 09/28/2015   Lab Results  Component Value Date   PROLACTIN 9.7 12/01/2022   PROLACTIN 17.2 04/07/2022   Lab Results  Component Value Date   CHOL 152 11/09/2023   TRIG 120 11/09/2023   HDL 41 11/09/2023   CHOLHDL 3.7 11/09/2023   VLDL 56 (H) 04/09/2022   LDLCALC 89 11/09/2023   LDLCALC 72 05/04/2023   Lab Results  Component Value Date   TSH 5.855 (H) 04/07/2022   TSH 1.560 06/28/2016    Therapeutic Level Labs: No results found for: "LITHIUM" Lab Results  Component Value Date   VALPROATE 118 (H) 04/26/2016   VALPROATE 136 (H) 04/26/2016   No results found for: "CBMZ"  Current Medications: Current Outpatient Medications  Medication Sig Dispense Refill   atorvastatin (LIPITOR) 10 MG tablet Take 1 tablet (10 mg total) by mouth daily. 90 tablet 3   cloNIDine (CATAPRES) 0.1 MG tablet Take 1 tablet (0.1 mg total) by mouth at bedtime. 90 tablet 2   Deutetrabenazine ER (AUSTEDO XR) 24 MG TB24 Take 24 mg by mouth daily. 30 tablet 2   donepezil (ARICEPT) 5 MG tablet Take 1 tablet (5 mg total) by mouth at bedtime. 30 tablet 2   ferrous sulfate (FEROSUL) 325 (65 FE) MG tablet Take 1 tablet (325 mg total) by mouth daily with breakfast. 90 tablet 3   FLUoxetine (PROZAC) 40 MG capsule Take 1 capsule (40 mg total) by mouth daily. 90 capsule 2   gabapentin (NEURONTIN) 100 MG capsule TAKE 1 CAPSULE BY MOUTH TWICE A DAY  ( IN THE MORNING & IN THE EVENING ) 56 capsule 1   hydrochlorothiazide (HYDRODIURIL) 25 MG tablet TAKE 1 TABLET BY MOUTH DAILY 28 tablet 2   QUEtiapine (SEROQUEL) 400 MG tablet Take 1 tablet (400 mg total) by mouth at bedtime. 30 tablet 2   [START ON 01/11/2024] Suvorexant (BELSOMRA) 10 MG TABS Take 1 tablet (10 mg total) by mouth at bedtime as needed. 30 tablet 0   No current facility-administered medications for this visit.      Musculoskeletal: Strength & Muscle Tone: within normal limits Gait & Station: normal Patient leans: N/A  Psychiatric Specialty Exam: Review of Systems  Psychiatric/Behavioral:  Positive for dysphoric mood and sleep disturbance. Negative for decreased concentration, hallucinations, self-injury and suicidal ideas. The patient is nervous/anxious. The patient is not hyperactive.     Blood pressure 130/85, pulse 88, temperature 98.7 F (37.1 C), temperature source Oral, height 5\' 4"  (1.626 m), weight 202 lb (91.6 kg), SpO2 100%.Body mass index is 34.67 kg/m.  General Appearance: Casual  Eye Contact:  Good  Speech:  Clear and Coherent and Normal Rate  Volume:  Normal  Mood:  Anxious and Depressed  Affect:  Appropriate  Thought Process:  Coherent, Goal Directed, and Descriptions of Associations: Intact  Orientation:  Full (Time, Place, and Person)  Thought Content: WDL and Paranoid Ideation   Suicidal Thoughts:  No  Homicidal Thoughts:  No  Memory:  Immediate;   Good Recent;   Fair Remote;   Fair  Judgement:  Good  Insight:  Good  Psychomotor Activity:  TD  Concentration:  Concentration: Good and Attention Span: Good  Recall:  Good  Fund of Knowledge: Fair  Language: Good  Akathisia:  No  Handed:  Right  AIMS (if indicated): done  Assets:  Communication Skills Desire for Improvement Housing Social Support  ADL's:  Intact  Cognition: Impaired,  Mild  Sleep:  Fair   Screenings: Geneticist, molecular Office Visit from 12/08/2022 [facility] Clinical Support from 11/21/2022 in [facility] Clinical Support from 11/07/2022 in [facility] Office Visit from 10/27/2022 in [facility]Dis[facility]  AIMS Total Score 8 28 24 17 4       AUDIT    Flowsheet Row Admission (Discharged) [facility]  [facility] Admission (Discharged) from [facility]12[facility]2014 [facility] Admission (Discharged) from 07/03/2014 [facility]  Alcohol Use Disorder Identification Test Final Score (AUDIT) 9 4 20       GAD-7  Flowsheet Row Clinical Support from 01/08/2024 in [facility] Office Visit from 11/09/2023 in [facility] [place] [facility] Of [place]. [facility] Clinical Support from 11/06/2023 in [facility] Clinical Support from 09/25/2023 in [facility] Clinical Support from 08/15/2023 in [facility]  Total GAD-7 Score 17 14 16 9 8       Mini-Mental    Flowsheet Row Office Visit from 01/31/2017 in [facility] Office Visit from 11/27/2016 in [facility] Office Visit from 06/28/2016 in [facility] Office Visit from 05/25/2016 in [facility] [place] - A Dept Of [place]. [facility] Office Visit from 03/11/2015 in [facility]  Total Score (max 30 points ) 25 11 18 15 26       PHQ2-9    Flowsheet Row Clinical Support from 01/08/2024 in [facility] Clinical Support from 12/25/2023 in [facility] [place] - A Dept Of [place]. [facility] Office Visit from 11/09/2023 in [facility] Of [facility]a [facility]. [facility] Clinical Support from 11/06/2023 in [facility] Clinical Support from 09/25/2023 in [facility]  PHQ-2 Total Score 6 2 2 2 2   PHQ-9 Total Score 23 8 8 16 12       Flowsheet Row Clinical Support from 01/08/2024 in [facility] Clinical Support from 11/06/2023 in [facility] Clinical Support from 09/25/2023 in [facility]  C-SSRS RISK CATEGORY Moderate Risk Moderate Risk Moderate Risk        Assessment and Plan:   Jody [name] is a 53 year old, African-American female with a past psychiatric history significant for schizoaffective disorder (depressed type), major depressive disorder, generalized anxiety disorder, sleep disturbances, and major neurocognitive disorder who presents to [facility] for follow up and medication management. ***  Collaboration of Care: Collaboration of Care: Medication Management AEB provider managing patient's psychiatric medications, Primary Care Provider AEB patient being seen by a primary care provider at [facility], Psychiatrist AEB patient being seen by mental health provider at this facility, and Referral or follow-up with counselor/therapist AEB patient being seen by a licensed clinical social worker at this facility  Patient/Guardian was advised Release of Information must be obtained prior to any record release in order to collaborate their care with an outside provider. Patient/Guardian was advised if they have not already done so to contact the registration department to sign all necessary forms in order for [place] to release information regarding their care.   Consent: Patient/Guardian gives verbal consent for treatment and assignment of benefits for services provided during this visit. Patient/Guardian expressed understanding and agreed to proceed.   1. Schizoaffective disorder, depressive type (HCC)  - QUEtiapine (SEROQUEL) 400 MG tablet; Take 1 tablet (400 mg total) by mouth at bedtime.  Dispense: 30 tablet; Refill: 2 - Deutetrabenazine ER (AUSTEDO XR) 24 MG TB24; Take 24 mg by mouth daily.  Dispense: 30 tablet; Refill: 2  2. Major neurocognitive disorder (HCC)  - donepezil (ARICEPT) 5 MG tablet; Take 1  tablet (5 mg total) by mouth at bedtime.  Dispense: 30 tablet; Refill: 2 - FLUoxetine (PROZAC) 40 MG capsule; Take 1 capsule (40 mg total) by mouth daily.  Dispense: 90 capsule;  Refill: 2  3. MDD (major depressive disorder), recurrent, severe, with psychosis (HCC)  - FLUoxetine (PROZAC) 40 MG capsule; Take 1 capsule (40 mg total) by mouth daily.  Dispense: 90 capsule; Refill: 2  4. Tardive dyskinesia  - Deutetrabenazine ER (AUSTEDO XR) 24 MG TB24; Take 24 mg by mouth daily.  Dispense: 30 tablet; Refill: 2  5. Sleep disturbances  - Suvorexant (BELSOMRA) 10 MG TABS; Take 1 tablet (10 mg total) by mouth at bedtime as needed.  Dispense: 30 tablet; Refill: 0  Patient to follow-up in 2 months Provider spent a total of 21 minutes with the patient/reviewing patient's chart  Meta Hatchet, PA 01/08/2024, 4:58 PM

## 2024-01-11 ENCOUNTER — Other Ambulatory Visit (HOSPITAL_COMMUNITY): Payer: Self-pay | Admitting: Physician Assistant

## 2024-01-11 DIAGNOSIS — F0390 Unspecified dementia without behavioral disturbance: Secondary | ICD-10-CM

## 2024-01-11 MED ORDER — CLONIDINE HCL 0.1 MG PO TABS: 0.1000 mg | ORAL_TABLET | Freq: Every day | ORAL | 0 refills | Status: DC

## 2024-01-11 NOTE — Progress Notes (Signed)
 Patient is requesting refills on her Clonidine prescription. Provider last filled this prescription on 01/19/2023. Patient reports that she is about to run out. Patient's medication to be e-prescribed to pharmacy of choice.

## 2024-02-04 ENCOUNTER — Other Ambulatory Visit (HOSPITAL_COMMUNITY): Payer: Self-pay | Admitting: Physician Assistant

## 2024-02-04 ENCOUNTER — Other Ambulatory Visit: Payer: Self-pay | Admitting: Family Medicine

## 2024-02-04 DIAGNOSIS — G479 Sleep disorder, unspecified: Secondary | ICD-10-CM

## 2024-02-04 DIAGNOSIS — E7800 Pure hypercholesterolemia, unspecified: Secondary | ICD-10-CM

## 2024-02-04 DIAGNOSIS — Z862 Personal history of diseases of the blood and blood-forming organs and certain disorders involving the immune mechanism: Secondary | ICD-10-CM

## 2024-02-06 ENCOUNTER — Ambulatory Visit: Payer: Medicare Other | Admitting: Nurse Practitioner

## 2024-02-13 ENCOUNTER — Telehealth (HOSPITAL_COMMUNITY): Payer: Self-pay | Admitting: *Deleted

## 2024-02-13 NOTE — Telephone Encounter (Signed)
 Patients sister called asking for a refill of Belsomra , asking that her provider sent it to [place]. Next appointment 6/10.

## 2024-02-13 NOTE — Telephone Encounter (Signed)
 Patient's medication was e-prescribed to pharmacy of choice. PDMP was reviewed prior to prescription being filled.

## 2024-02-26 ENCOUNTER — Ambulatory Visit (AMBULATORY_SURGERY_CENTER)

## 2024-02-26 ENCOUNTER — Telehealth: Payer: Self-pay

## 2024-02-26 VITALS — Ht 64.0 in | Wt 192.0 lb

## 2024-02-26 DIAGNOSIS — Z1211 Encounter for screening for malignant neoplasm of colon: Secondary | ICD-10-CM

## 2024-02-26 MED ORDER — BISACODYL EC 5 MG PO TBEC: 5.0000 mg | DELAYED_RELEASE_TABLET | ORAL | 0 refills | Status: DC

## 2024-02-26 MED ORDER — PEG 3350-KCL-NA BICARB-NACL 420 G PO SOLR: 4000.0000 mL | Freq: Once | ORAL | 0 refills | Status: AC

## 2024-02-26 NOTE — Telephone Encounter (Signed)
 Pt with documented legal guardian (sister Nitika Jackowski). Spoke with sister in attempt to inform her that she would need to come in to sign consent for the pts colonoscopy or provide verbal consent with two nurses.   Per sister the patient was released from legal guardianship and is allowed to make decision for herself at this time.

## 2024-02-26 NOTE — Progress Notes (Signed)
 No egg or soy allergy known to patient  No issues known to pt with past sedation with any surgeries or procedures Patient denies ever being told they had issues or difficulty with intubation  No FH of Malignant Hyperthermia Pt is not on diet pills Pt is not on  home 02  Pt is not on blood thinners  Pt denies issues with constipation  No A fib or A flutter Have any cardiac testing pending-- no  LOA: independent  Prep: suprep   Patient's chart reviewed by Cathlyn Parsons CNRA prior to previsit and patient appropriate for the [facility].  Previsit completed and red dot placed by patient's name on their procedure day (on provider's schedule).     PV completed with patient. Prep instructions sent to home address.

## 2024-03-03 ENCOUNTER — Other Ambulatory Visit: Payer: Self-pay | Admitting: Nurse Practitioner

## 2024-03-03 DIAGNOSIS — R232 Flushing: Secondary | ICD-10-CM

## 2024-03-03 DIAGNOSIS — E7800 Pure hypercholesterolemia, unspecified: Secondary | ICD-10-CM

## 2024-03-03 DIAGNOSIS — Z862 Personal history of diseases of the blood and blood-forming organs and certain disorders involving the immune mechanism: Secondary | ICD-10-CM

## 2024-03-04 ENCOUNTER — Other Ambulatory Visit: Payer: Self-pay | Admitting: Nurse Practitioner

## 2024-03-04 DIAGNOSIS — R232 Flushing: Secondary | ICD-10-CM

## 2024-03-04 DIAGNOSIS — E7800 Pure hypercholesterolemia, unspecified: Secondary | ICD-10-CM

## 2024-03-04 DIAGNOSIS — Z862 Personal history of diseases of the blood and blood-forming organs and certain disorders involving the immune mechanism: Secondary | ICD-10-CM

## 2024-03-04 NOTE — Telephone Encounter (Unsigned)
 Copied from CRM [id number]. Topic: Clinical - Medication Refill >> Mar 04, 2024 11:30 AM Yolanda T wrote: Medication: atorvastatin  (LIPITOR) 10 MG tablet, FEROSUL 325 (65 Fe) MG tablet and gabapentin  (NEURONTIN ) 100 MG capsule  Has the patient contacted their pharmacy? Yes  This is the patient's preferred pharmacy:  [facility] [place] - [address] [place] [address] Phone: [phone] Fax: [phone]  Is this the correct pharmacy for this prescription? Yes  Has the prescription been filled recently? Yes  Is the patient out of the medication? Yes  Has the patient been seen for an appointment in the last year OR does the patient have an upcoming appointment? Yes  Can we respond through MyChart? No  Agent: Please be advised that Rx refills may take up to 3 business days. We ask that you follow-up with your pharmacy.  Patient is in panic mode as she has taken her last pill today. Unable to get there psych meds without these medications

## 2024-03-04 NOTE — Telephone Encounter (Signed)
 Requested medication (s) are due for refill today: yes  Requested medication (s) are on the active medication list: yes  Last refill:   atorvastatin : 02/04/24 #28 tabs Ferosul 02/04/24 #28 tabs Gabapentin  12/11/23 #56 caps  Future visit scheduled: no  Notes to clinic:  called pt and LM on VM to call back to make appt.    Requested Prescriptions  Pending Prescriptions Disp Refills   atorvastatin  (LIPITOR) 10 MG tablet [Pharmacy Med Name: Atorvastatin  Calcium  10MG  TABS] 28 tablet 0    Sig: TAKE 1 TABLET BY MOUTH DAILY     Cardiovascular:  Antilipid - Statins Failed - 03/04/2024  2:49 PM      Failed - Lipid Panel in normal range within the last 12 months    Cholesterol, Total  Date Value Ref Range Status  11/09/2023 152 100 - 199 mg/dL Final   LDL Chol Calc (NIH)  Date Value Ref Range Status  11/09/2023 89 0 - 99 mg/dL Final   HDL  Date Value Ref Range Status  11/09/2023 41 >39 mg/dL Final   Triglycerides  Date Value Ref Range Status  11/09/2023 120 0 - 149 mg/dL Final         Passed - Patient is not pregnant      Passed - Valid encounter within last 12 months    Recent Outpatient Visits           3 months ago Prediabetes   [place] [facility] [place] [facility] [place]. [facility] Collins Dean, NP   10 months ago Prediabetes   [place] [facility] [place] [facility] [place]. [facility] Collins Dean, NP   1 year ago Encounter for Papanicolaou smear for cervical cancer screening   [place] [facility] [place] [facility] Dept Of [place]. [facility] Collins Dean, NP   1 year ago Essential hypertension   [place] [facility] [place] [facility] Dept Of [place]. [facility] Collins Dean, NP   1 year ago Prolonged Q-T interval on ECG   [place] [facility] [place] [facility] Dept Of [place]. [facility], Maile Score, NP               FEROSUL 325 (65 Fe) MG tablet [Pharmacy Med Name: FeroSul 325  (65 Fe)MG TABS] 28 tablet 0    Sig: TAKE 1 TABLET BY MOUTH DAILY WITH BREAKFAST     Endocrinology:  Minerals - Iron Supplementation Failed - 03/04/2024  2:49 PM      Failed - RBC in normal range and within 360 days    RBC  Date Value Ref Range Status  11/09/2023 5.58 (H) 3.77 - 5.28 x10E6/uL Final  08/28/2023 5.62 (H) 3.87 - 5.11 MIL/uL Final         Failed - Fe (serum) in normal range and within 360 days    Iron  Date Value Ref Range Status  06/10/2018 91 27 - 159 ug/dL Final   Iron Saturation  Date Value Ref Range Status  06/10/2018 30 15 - 55 % Final         Failed - Ferritin in normal range and within 360 days    Ferritin  Date Value Ref Range Status  06/10/2018 66 15 - 150 ng/mL Final         Passed - HGB in normal range and within 360 days    Hemoglobin  Date  Value Ref Range Status  11/09/2023 13.4 11.1 - 15.9 g/dL Final         Passed - HCT in normal range and within 360 days    Hematocrit  Date Value Ref Range Status  11/09/2023 42.4 34.0 - 46.6 % Final         Passed - Valid encounter within last 12 months    Recent Outpatient Visits           3 months ago Prediabetes   [place] [facility] [place] [facility] Dept Of [place]. [facility] Collins Dean, NP   10 months ago Prediabetes   [place] [facility] [place] - [facility] Dept Of [place]. [facility] Collins Dean, NP   1 year ago Encounter for Papanicolaou smear for cervical cancer screening   [place] [facility] [place] [facility] Dept Of [place]. [facility] Collins Dean, NP   1 year ago Essential hypertension   [place] [facility] [place] [facility] Dept Of [place]. [facility] Collins Dean, NP   1 year ago Prolonged Q-T interval on ECG   [place] [facility] [place] [facility] Dept Of [place]. [facility] [place], [place] W, NP               gabapentin  (NEURONTIN ) 100 MG capsule [Pharmacy Med Name: Gabapentin  100MG  CAPS] 56 capsule 1    Sig:  TAKE 1 CAPSULE BY MOUTH TWICE A DAY  ( IN THE MORNING & IN THE EVENING )     Neurology: Anticonvulsants - gabapentin  Passed - 03/04/2024  2:49 PM      Passed - Cr in normal range and within 360 days    Creat  Date Value Ref Range Status  11/01/2016 0.89 0.50 - 1.10 mg/dL Final   Creatinine, Ser  Date Value Ref Range Status  11/09/2023 0.94 0.57 - 1.00 mg/dL Final   Creatinine, POC  Date Value Ref Range Status  02/01/2017 50 mg/dL Final         Passed - Completed PHQ-2 or PHQ-9 in the last 360 days      Passed - Valid encounter within last 12 months    Recent Outpatient Visits           3 months ago Prediabetes   [place] [facility] [place] [facility] [place]. [facility] Collins Dean, NP   10 months ago Prediabetes   [place] [facility] [place] [facility] Dept Of [place]. [facility] Collins Dean, NP   1 year ago Encounter for Papanicolaou smear for cervical cancer screening   [place] [facility] [place] [facility] Dept Of [place]. [facility] Collins Dean, NP   1 year ago Essential hypertension   [place] [facility] [place] - [facility] Dept Of [place]. [facility] Collins Dean, NP   1 year ago Prolonged Q-T interval on ECG   [place] [facility] [place] [facility] Dept Of [place]. [facility] Collins Dean, [place]

## 2024-03-05 NOTE — Telephone Encounter (Signed)
 Duplicate request- all filled 03/04/24 with note- needs appointment Requested Prescriptions  Pending Prescriptions Disp Refills   atorvastatin  (LIPITOR) 10 MG tablet 28 tablet 0    Sig: Take 1 tablet (10 mg total) by mouth daily.     Cardiovascular:  Antilipid - Statins Failed - 03/05/2024  1:42 PM      Failed - Lipid Panel in normal range within the last 12 months    Cholesterol, Total  Date Value Ref Range Status  11/09/2023 152 100 - 199 mg/dL Final   LDL Chol Calc (NIH)  Date Value Ref Range Status  11/09/2023 89 0 - 99 mg/dL Final   HDL  Date Value Ref Range Status  11/09/2023 41 >39 mg/dL Final   Triglycerides  Date Value Ref Range Status  11/09/2023 120 0 - 149 mg/dL Final         Passed - Patient is not pregnant      Passed - Valid encounter within last 12 months    Recent Outpatient Visits           3 months ago Prediabetes   [facility] [place]. [facility] Collins Dean, NP   10 months ago Prediabetes   [facility] [place]. [facility] Collins Dean, NP   1 year ago Encounter for Papanicolaou smear for cervical cancer screening   [facility] Dept [facility] [place]. [facility] Collins Dean, NP   1 year ago Essential hypertension   [facility] [facility] [place] [facility] Dept Of [place]. [facility] Collins Dean, NP   1 year ago Prolonged Q-T interval on ECG   [facility] [facility] Dept [facility] [place]. [facility] [place], [place] W, NP               gabapentin  (NEURONTIN ) 100 MG capsule 56 capsule 1     Neurology: Anticonvulsants - gabapentin  Passed - 03/05/2024  1:42 PM      Passed - Cr in normal range and within 360 days    Creat  Date Value Ref Range Status  11/01/2016 0.89 0.50 - 1.10 mg/dL Final   Creatinine, Ser  Date Value Ref Range Status  11/09/2023 0.94 0.57 - 1.00 mg/dL Final    Creatinine, POC  Date Value Ref Range Status  02/01/2017 50 mg/dL Final         Passed - Completed PHQ-2 or PHQ-9 in the last 360 days      Passed - Valid encounter within last 12 months    Recent Outpatient Visits           3 months ago Prediabetes   [facility] [place]. [facility] Collins Dean, NP   10 months ago Prediabetes   [facility] [place] [facility] [place]. [facility] Collins Dean, NP   1 year ago Encounter for Papanicolaou smear for cervical cancer screening   [facility] [place] [facility] Dept [place]. [facility] Collins Dean, NP   1 year ago Essential hypertension   [place] [facility] [place] [facility] Dept [facility] [place]. [facility] Collins Dean, NP   1 year ago Prolonged Q-T interval on ECG   [facility] Dept Of [place]. [facility] [place], [place] W, NP               ferrous sulfate  (FEROSUL) 325 (65 FE) MG tablet 28 tablet 0    Sig: Take 1 tablet (325 mg total) by mouth daily with breakfast.     Endocrinology:  Minerals - Iron Supplementation Failed - 03/05/2024  1:42 PM      Failed - RBC in normal range and within 360 days    RBC  Date Value Ref Range Status  11/09/2023 5.58 (H) 3.77 - 5.28 x10E6/uL Final  08/28/2023 5.62 (H) 3.87 - 5.11 MIL/uL Final         Failed - Fe (serum) in normal range and within 360 days    Iron  Date Value Ref Range Status  06/10/2018 91 27 - 159 ug/dL Final   Iron Saturation  Date Value Ref Range Status  06/10/2018 30 15 - 55 % Final         Failed - Ferritin in normal range and within 360 days    Ferritin  Date Value Ref Range Status  06/10/2018 66 15 - 150 ng/mL Final         Passed - HGB in normal range and within 360 days    Hemoglobin  Date Value Ref Range Status  11/09/2023 13.4 11.1 - 15.9 g/dL Final         Passed - HCT in normal range and within 360 days     Hematocrit  Date Value Ref Range Status  11/09/2023 42.4 34.0 - 46.6 % Final         Passed - Valid encounter within last 12 months    Recent Outpatient Visits           3 months ago Prediabetes   [facility] [place] [facility] [place]. [facility] Collins Dean, NP   10 months ago Prediabetes   [facility] [place] [facility] Dept Of [place]. [facility] Collins Dean, NP   1 year ago Encounter for Papanicolaou smear for cervical cancer screening   [facility] [facility] [place] [facility] Dept [facility] [place]. [facility] Collins Dean, NP   1 year ago Essential hypertension   [place] [facility] [place] [facility] Dept Of [place]. [facility] Collins Dean, NP   1 year ago Prolonged Q-T interval on ECG   [place] [facility] [place] [facility] Dept Of . [facility] Collins Dean, [place]

## 2024-03-11 ENCOUNTER — Encounter (HOSPITAL_COMMUNITY): Payer: Self-pay | Admitting: Physician Assistant

## 2024-03-11 ENCOUNTER — Ambulatory Visit (INDEPENDENT_AMBULATORY_CARE_PROVIDER_SITE_OTHER): Admitting: Physician Assistant

## 2024-03-11 DIAGNOSIS — G2401 Drug induced subacute dyskinesia: Secondary | ICD-10-CM | POA: Diagnosis not present

## 2024-03-11 DIAGNOSIS — F251 Schizoaffective disorder, depressive type: Secondary | ICD-10-CM | POA: Diagnosis not present

## 2024-03-11 DIAGNOSIS — G479 Sleep disorder, unspecified: Secondary | ICD-10-CM | POA: Diagnosis not present

## 2024-03-11 DIAGNOSIS — F333 Major depressive disorder, recurrent, severe with psychotic symptoms: Secondary | ICD-10-CM

## 2024-03-11 DIAGNOSIS — F0390 Unspecified dementia without behavioral disturbance: Secondary | ICD-10-CM

## 2024-03-11 MED ORDER — AUSTEDO XR 30 MG PO TB24
30.0000 mg | ORAL_TABLET | Freq: Every day | ORAL | 2 refills | Status: DC
Start: 2024-03-11 — End: 2024-03-13

## 2024-03-11 MED ORDER — DONEPEZIL HCL 5 MG PO TABS: 5.0000 mg | ORAL_TABLET | Freq: Every day | ORAL | 2 refills | Status: DC

## 2024-03-11 MED ORDER — BELSOMRA 10 MG PO TABS: 10.0000 mg | ORAL_TABLET | Freq: Every evening | ORAL | 0 refills | Status: DC | PRN

## 2024-03-11 MED ORDER — FLUOXETINE HCL 40 MG PO CAPS
40.0000 mg | ORAL_CAPSULE | Freq: Every day | ORAL | 2 refills | Status: DC
Start: 2024-03-11 — End: 2024-04-22

## 2024-03-11 MED ORDER — CLONIDINE HCL 0.1 MG PO TABS: 0.1000 mg | ORAL_TABLET | Freq: Every day | ORAL | 0 refills | Status: DC

## 2024-03-11 MED ORDER — QUETIAPINE FUMARATE 400 MG PO TABS: 400.0000 mg | ORAL_TABLET | Freq: Every day | ORAL | 2 refills | Status: DC

## 2024-03-11 NOTE — Progress Notes (Signed)
 [facility] MD/PA/NP OP Progress Note  03/11/2024 9:26 PM Jody [name]  MRN:  [id number]  Chief Complaint:  Chief Complaint  Patient presents with   Follow-up   Medication Management   HPI:   Jody [name] is a 53 year old, African-American female with a past psychiatric history significant for schizoaffective disorder (depressed type), major depressive disorder, generalized anxiety disorder, sleep disturbances, and major neurocognitive disorder who presents to [facility] for follow up and medication management. Patient is currently being managed on the following psychiatric medications:  Seroquel  400 mg at bedtime Prozac  40 mg daily Belsomra  10 mg at bedtime Austedo  XR 24 mg daily Donepezil  5 mg daily  Patient reports that she recently had an episode of paranoia characterized by fear of blue and gray-colored cars.  During that time, patient also reports that she thought people were going in and out of her house and moving her items around.  She reports that this incident occurred roughly 2 weeks ago and lasted for 2 days before everything smoothed over.  Patient reports that she is tolerating her medications well and denies experiencing any recent changes in her behavior.  She endorses some depression attributed to the recent loss of her brother-in-law.  Patient endorses depression she rates a 4 out of 10 with 10 being most severe.  Patient endorses depressive episodes 2 to 3 days out of the week.  Patient endorses the following depressive symptoms: feelings of sadness, lack of motivation, decreased concentration, decreased energy, irritability, feelings of guilt/worthlessness, and hopelessness.  Patient denies crying spells.  She continues to endorse anxiety and rates her anxiety a 6 out of 10.  Patient denies any new stressors at this time.  Patient is alert and oriented x 4, calm, cooperative, and fully engaged in conversation during the  encounter.  Patient endorses neutral mood.  Patient exhibits depressed mood with appropriate affect.  Patient denies suicidal or homicidal ideations.  She further denies auditory or visual hallucinations and does not appear to be responding to internal/external stimuli.  Patient endorses fair sleep and receives on average 6 hours of sleep per night.  She notes that she will wake up on occasion during the night.  Patient endorses good appetite and eats on average 2 meals per day.  Patient denies alcohol consumption or illicit drug use.  Patient endorses tobacco use and smokes on average 5 cigarettes/day.  Visit Diagnosis:    ICD-10-CM   1. Schizoaffective disorder, depressive type (HCC)  F25.1 Deutetrabenazine  ER (AUSTEDO  XR) 30 MG TB24    QUEtiapine  (SEROQUEL ) 400 MG tablet    2. Tardive dyskinesia  G24.01 Deutetrabenazine  ER (AUSTEDO  XR) 30 MG TB24    3. Major neurocognitive disorder (HCC)  F03.90 FLUoxetine  (PROZAC ) 40 MG capsule    donepezil  (ARICEPT ) 5 MG tablet    cloNIDine  (CATAPRES ) 0.1 MG tablet    4. MDD (major depressive disorder), recurrent, severe, with psychosis (HCC)  F33.3 FLUoxetine  (PROZAC ) 40 MG capsule    5. Sleep disturbances  G47.9 Suvorexant  (BELSOMRA ) 10 MG TABS      Past Psychiatric History:  Schizophrenia - diagnosed roughly 7 years ago when patient was attending [facility] Patient was diagnosed with schizoaffective disorder (depressive type) when [facility] - 04/12/2022.  In addition to being diagnosed with schizoaffective disorder, patient was treated for insomnia, generalized anxiety disorder, and tardive dyskinesia.   **Onset of dementia, major neurocognitive disorder, due to Alzheimer's disease, without behavioral disturbance, mild  Past Medical History:  Past Medical History:  Diagnosis Date   Anxiety and depression    Cystitis, interstitial    Elevated troponin    a. 09/2015: CP/elevated troponin up  to 0.11 - unclear significance. CT neg for PE, LHC neg for CAD, normal echo.   Hypertension    Microcytic anemia    Noted on labs   Panic attack    Paranoid schizophrenia (HCC)    Tobacco abuse     Past Surgical History:  Procedure Laterality Date   CARDIAC CATHETERIZATION N/A 09/29/2015   Procedure: Left Heart Cath and Coronary Angiography;  Surgeon: Lucendia Rusk, MD;  Location: [facility] INVASIVE CV LAB;  Service: Cardiovascular;  Laterality: N/A;   TUBAL LIGATION      Family Psychiatric History:  Grandmother (maternal) - unsure of the diagnosis but states that her grandmother had mental health issues Uncle (maternal) - Schizophrenia and bipolar disorder, he is currently taking medications but is unsure of the type.   Family history of suicide: Patient denies Family history of homicide: Patient denies Family history of substance abuse: Per patient's sister, alcohol abuse runs in the family  Family History:  Family History  Problem Relation Age of Onset   Colon polyps Mother    Hypertension Mother    Diabetes Father    Hypertension Sister    Lupus Sister    Heart disease Maternal Grandfather    Lupus Other    BRCA 1/2 Neg Hx    Breast cancer Neg Hx    Colon cancer Neg Hx    Rectal cancer Neg Hx    Stomach cancer Neg Hx     Social History:  Social History   Socioeconomic History   Marital status: Single    Spouse name: Not on file   Number of children: 5   Years of education: 12   Highest education level: Not on file  Occupational History   Occupation: Disabled  Tobacco Use   Smoking status: Former    Current packs/day: 0.00    Average packs/day: 0.3 packs/day for 4.0 years (1.0 ttl pk-yrs)    Types: Cigarettes    Start date: 05/01/2012    Quit date: 05/01/2016    Years since quitting: 7.8   Smokeless tobacco: Never  Vaping Use   Vaping status: Never Used  Substance and Sexual Activity   Alcohol use: Not Currently    Alcohol/week: 0.0 standard drinks of  alcohol    Comment: Quit 2017   Drug use: No   Sexual activity: Not Currently    Birth control/protection: Condom  Other Topics Concern   Not on file  Social History Narrative   Lives alone   Caffeine use: Soda- daily   Right-handed   Social Drivers of Health   Financial Resource Strain: Low Risk  (12/25/2023)   Overall Financial Resource Strain (CARDIA)    Difficulty of Paying Living Expenses: Not hard at all  Food Insecurity: No Food Insecurity (12/25/2023)   Hunger Vital Sign    Worried About Running Out of Food in the Last Year: Never true    Ran Out of Food in the Last Year: Never true  Transportation Needs: No Transportation Needs (12/25/2023)   PRAPARE - Administrator, Civil Service (Medical): No    Lack of Transportation (Non-Medical): No  Physical Activity: Insufficiently Active (12/25/2023)   Exercise Vital Sign    Days of Exercise per Week: 1 day    Minutes of Exercise per Session:  20 min  Stress: No Stress Concern Present (12/25/2023)   [employer] of Occupational Health - Occupational Stress Questionnaire    Feeling of Stress : Not at all  Recent Concern: Stress - Stress Concern Present (11/09/2023)   [employer] of Occupational Health - Occupational Stress Questionnaire    Feeling of Stress : To some extent  Social Connections: Moderately Isolated (12/25/2023)   Social Connection and Isolation Panel [NHANES]    Frequency of Communication with Friends and Family: Once a week    Frequency of Social Gatherings with Friends and Family: Once a week    Attends Religious Services: 1 to 4 times per year    Active Member of [employer] or Organizations: Yes    Attends Banker Meetings: 1 to 4 times per year    Marital Status: Divorced    Allergies:  Allergies  Allergen Reactions   Penicillins Itching and Rash    Metabolic Disorder Labs: Lab Results  Component Value Date   HGBA1C 6.3 11/09/2023   MPG 114.02 04/07/2022   MPG 117  09/28/2015   Lab Results  Component Value Date   PROLACTIN 9.7 12/01/2022   PROLACTIN 17.2 04/07/2022   Lab Results  Component Value Date   CHOL 152 11/09/2023   TRIG 120 11/09/2023   HDL 41 11/09/2023   CHOLHDL 3.7 11/09/2023   VLDL 56 (H) 04/09/2022   LDLCALC 89 11/09/2023   LDLCALC 72 05/04/2023   Lab Results  Component Value Date   TSH 5.855 (H) 04/07/2022   TSH 1.560 06/28/2016    Therapeutic Level Labs: No results found for: LITHIUM Lab Results  Component Value Date   VALPROATE 118 (H) 04/26/2016   VALPROATE 136 (H) 04/26/2016   No results found for: CBMZ  Current Medications: Current Outpatient Medications  Medication Sig Dispense Refill   Deutetrabenazine  ER (AUSTEDO  XR) 30 MG TB24 Take 30 mg by mouth daily. 30 tablet 2   QUEtiapine  (SEROQUEL ) 400 MG tablet Take 1 tablet (400 mg total) by mouth at bedtime. 30 tablet 2   atorvastatin  (LIPITOR) 10 MG tablet Take 1 tablet (10 mg total) by mouth daily. Please schedule office visit for additional refills. 28 tablet 0   bisacodyl  5 MG EC tablet Take 1 tablet (5 mg total) by mouth as directed. 4 tablet 0   cloNIDine  (CATAPRES ) 0.1 MG tablet Take 1 tablet (0.1 mg total) by mouth at bedtime. 90 tablet 0   Deutetrabenazine  ER (AUSTEDO  XR) 24 MG TB24 Take 24 mg by mouth daily. 30 tablet 2   donepezil  (ARICEPT ) 5 MG tablet Take 1 tablet (5 mg total) by mouth at bedtime. 30 tablet 2   ferrous sulfate  (FEROSUL) 325 (65 FE) MG tablet Take 1 tablet (325 mg total) by mouth daily with breakfast. Please schedule office visit for additional refills. 28 tablet 0   FLUoxetine  (PROZAC ) 40 MG capsule Take 1 capsule (40 mg total) by mouth daily. 90 capsule 2   gabapentin  (NEURONTIN ) 100 MG capsule TAKE 1 CAPSULE BY MOUTH TWICE A DAY  ( IN THE MORNING & IN THE EVENING ) 56 capsule 1   hydrochlorothiazide  (HYDRODIURIL ) 25 MG tablet TAKE 1 TABLET BY MOUTH DAILY 28 tablet 2   [START ON 03/14/2024] Suvorexant  (BELSOMRA ) 10 MG TABS Take 1  tablet (10 mg total) by mouth at bedtime as needed. 30 tablet 0   trimethoprim  (TRIMPEX ) 100 MG tablet Take 100 mg by mouth daily.     No current facility-administered medications for this visit.  Musculoskeletal: Strength & Muscle Tone: within normal limits Gait & Station: normal Patient leans: N/A  Psychiatric Specialty Exam: Review of Systems  Psychiatric/Behavioral:  Positive for dysphoric mood and sleep disturbance. Negative for decreased concentration, hallucinations, self-injury and suicidal ideas. The patient is nervous/anxious. The patient is not hyperactive.     Blood pressure (!) 135/100, pulse 89, temperature 98.3 F (36.8 C), temperature source Oral, height 5' 4 (1.626 m), weight 191 lb 9.6 oz (86.9 kg), SpO2 99%.Body mass index is 32.89 kg/m.  General Appearance: Casual  Eye Contact:  Good  Speech:  Clear and Coherent and Normal Rate  Volume:  Normal  Mood:  Anxious and Depressed  Affect:  Appropriate  Thought Process:  Coherent, Goal Directed, and Descriptions of Associations: Intact  Orientation:  Full (Time, Place, and Person)  Thought Content: WDL and Paranoid Ideation   Suicidal Thoughts:  No  Homicidal Thoughts:  No  Memory:  Immediate;   Good Recent;   Fair Remote;   Fair  Judgement:  Good  Insight:  Good  Psychomotor Activity:  TD  Concentration:  Concentration: Good and Attention Span: Good  Recall:  Good  Fund of Knowledge: Fair  Language: Good  Akathisia:  No  Handed:  Right  AIMS (if indicated): done  Assets:  Communication Skills Desire for Improvement Housing Social Support  ADL's:  Intact  Cognition: Impaired,  Mild  Sleep:  Fair   Screenings: AIMS    Flowsheet Row Clinical Support from 03/11/2024 in [facility] Office Visit from 12/08/2022 in [facility] Clinical Support from 11/21/2022 in [facility] Clinical Support from 11/07/2022 in [facility] Office Visit from 10/27/2022 in [place]  AIMS Total Score 9 8 28 24 17       AUDIT    Flowsheet Row [facility]) [facility] Ad[facility] (Dis[facility]) [facility]  Alcohol Use Disorder Identification Test Final Score (AUDIT) 9 4 20       GAD-7    Flowsheet Row Clinical Support from 03/11/2024 in [facility] Clinical Support from 01/08/2024 in [facility] Office Visit from 11/09/2023 in [facility] [place] [facility] Dept Of [place]. [facility] Clinical Support from 11/06/2023 in [facility] Clinical Support from 09/25/2023 in [facility]  Total GAD-7 Score 16 17 14 16 9       Mini-Mental    Flowsheet Row Office Visit from 01/31/2017 in [facility] Office Visit from 11/27/2016 in [facility] Office Visit from 06[facility]28/2016 in [facility] Office Visit from 05/25/2016 in [facility] [place] - A Dept Of [place]. [facility] Office Visit from 03/11/2015 in [facility]  Total Score (max 30 points ) 25 11 18 15 26       PHQ2-9    Flowsheet Row Clinical Support from 03/11/2024 in [facility] Clinical Support from 01/08/2024 in [facility] Clinical Support from 12/25/2023 in [facility] [place] [facility] Dept Of [place]. [facility] Office Visit from 11/09/2023 in [facility] A Dept Of [place]. [facility] Clinical Support from  11/06/2023 in [facility]  PHQ-2 Total  Score 5 6 2 2 2   PHQ-9 Total Score 12 23 8 8 16       Flowsheet Row Clinical Support from 03/11/2024 in [facility] Clinical Support from 01/08/2024 in [facility] Clinical Support from 11/06/2023 in [facility]  C-SSRS RISK CATEGORY Moderate Risk Moderate Risk Moderate Risk        Assessment and Plan:   Jody [name] is a 53 year old, African-American female with a past psychiatric history significant for schizoaffective disorder (depressed type), major depressive disorder, generalized anxiety disorder, sleep disturbances, and major neurocognitive disorder who [facility] for follow up and medication management.  Patient presents to the encounter stating that she continues to take her medications regularly.  She denies experiencing any adverse side effects at this time.  An aims assessment was performed with the patient scoring a 9.  Involuntary movements were observed in the patient's lips during the assessment.  Per patient's sister, patient has also been experiencing movements in her hands and feet.  Patient is currently on Austedo  XR 24 mg daily.  Provider to increase patient's Austedo  dosage from 24 mg to 30 mg daily for the management of her tardive dyskinesia.  Prior to this encounter, patient reports that she was experiencing some paranoia characterized by fear of blue and gray-colored cars.  She also reported that she thought people were coming in and out of her home and moving her items around.  She reports that her episode of paranoia occurred for roughly 2 days before everything smoothed over.  She reports that she is currently tolerating her medications.  She does report that she has been experiencing depression and anxiety attributed to the recent loss of her brother-in-law.  A PHQ-9 screen was performed with the patient scoring a 12.  A GAD-7  screen was also performed with the patient scoring a 16.  Despite experiencing ongoing depression and anxiety, patient endorses stability and would like to continue taking her medications as prescribed.  A [place] Suicide Severity Rating Scale was performed with the patient being considered moderate risk.  Patient denies suicidal ideations and is able to contract for safety following the conclusion of the encounter.  During review of the patient's medications, provider discovered that patient was still receiving mirtazapine  45 mg at bedtime.  Provider informed patient that mirtazapine  was previously discontinued.  Provider informed patient that she does not need to continue taking mirtazapine  at this time.  Patient vocalized understanding.  Collaboration of Care: Collaboration of Care: Medication Management AEB provider managing patient's psychiatric medications, Primary Care Provider AEB patient being seen by a primary care provider at [facility], Psychiatrist AEB patient being seen by mental health provider at this facility, and Referral or follow-up with counselor/therapist AEB patient being seen by a licensed clinical social worker at this facility  Patient/Guardian was advised Release of Information must be obtained prior to any record release in order to collaborate their care with an outside provider. Patient/Guardian was advised if they have not already done so to contact the registration department to sign all necessary forms in order for us  to release information regarding their care.   Consent: Patient/Guardian gives verbal consent for treatment and assignment of benefits for services provided during this visit. Patient/Guardian expressed understanding and agreed to proceed.   1. Schizoaffective disorder, depressive type (HCC)  - Deutetrabenazine   ER (AUSTEDO  XR) 30 MG TB24; Take 30 mg by mouth daily.  Dispense: 30 tablet; Refill: 2 - QUEtiapine  (SEROQUEL ) 400 MG  tablet; Take 1 tablet (400 mg total) by mouth at bedtime.  Dispense: 30 tablet; Refill: 2  2. Tardive dyskinesia  - Deutetrabenazine  ER (AUSTEDO  XR) 30 MG TB24; Take 30 mg by mouth daily.  Dispense: 30 tablet; Refill: 2  3. Major neurocognitive disorder (HCC)  - FLUoxetine  (PROZAC ) 40 MG capsule; Take 1 capsule (40 mg total) by mouth daily.  Dispense: 90 capsule; Refill: 2 - donepezil  (ARICEPT ) 5 MG tablet; Take 1 tablet (5 mg total) by mouth at bedtime.  Dispense: 30 tablet; Refill: 2 - cloNIDine  (CATAPRES ) 0.1 MG tablet; Take 1 tablet (0.1 mg total) by mouth at bedtime.  Dispense: 90 tablet; Refill: 0  4. MDD (major depressive disorder), recurrent, severe, with psychosis (HCC)  - FLUoxetine  (PROZAC ) 40 MG capsule; Take 1 capsule (40 mg total) by mouth daily.  Dispense: 90 capsule; Refill: 2  5. Sleep disturbances PDMP was reviewed following the conclusion of the encounter  - Suvorexant  (BELSOMRA ) 10 MG TABS; Take 1 tablet (10 mg total) by mouth at bedtime as needed.  Dispense: 30 tablet; Refill: 0  Patient to follow-up in 6 weeks Provider spent a total of 27 minutes with the patient/reviewing patient's chart  Gates Kasal, PA 03/11/2024, 9:26 PM

## 2024-03-13 ENCOUNTER — Encounter: Payer: Self-pay | Admitting: Internal Medicine

## 2024-03-13 ENCOUNTER — Ambulatory Visit: Admitting: Internal Medicine

## 2024-03-13 VITALS — BP 129/87 | HR 67 | Temp 98.0°F | Resp 22 | Ht 64.0 in | Wt 188.0 lb

## 2024-03-13 DIAGNOSIS — K5730 Diverticulosis of large intestine without perforation or abscess without bleeding: Secondary | ICD-10-CM | POA: Diagnosis not present

## 2024-03-13 DIAGNOSIS — F418 Other specified anxiety disorders: Secondary | ICD-10-CM | POA: Diagnosis not present

## 2024-03-13 DIAGNOSIS — Z1211 Encounter for screening for malignant neoplasm of colon: Secondary | ICD-10-CM

## 2024-03-13 DIAGNOSIS — K648 Other hemorrhoids: Secondary | ICD-10-CM | POA: Diagnosis not present

## 2024-03-13 DIAGNOSIS — F200 Paranoid schizophrenia: Secondary | ICD-10-CM | POA: Diagnosis not present

## 2024-03-13 DIAGNOSIS — I10 Essential (primary) hypertension: Secondary | ICD-10-CM | POA: Diagnosis not present

## 2024-03-13 MED ORDER — SODIUM CHLORIDE 0.9 % IV SOLN: 500.0000 mL | Freq: Once | INTRAVENOUS | Status: AC

## 2024-03-13 NOTE — Op Note (Signed)
 [facility] Patient Name: Jody [name] Procedure Date: 03/13/2024 11:37 AM MRN: [id number] Endoscopist: Nannette Babe , MD, [id number] Age: 53 Referring MD:  Date of Birth: 19-Nov-1970 Gender: Female Account #: [id number] Procedure:                Colonoscopy Indications:              Screening for colorectal malignant neoplasm Medicines:                Monitored Anesthesia Care Procedure:                Pre-Anesthesia Assessment:                           - Prior to the procedure, a History and Physical                            was performed, and patient medications and                            allergies were reviewed. The patient's tolerance of                            previous anesthesia was also reviewed. The risks                            and benefits of the procedure and the sedation                            options and risks were discussed with the patient.                            All questions were answered, and informed consent                            was obtained. Prior Anticoagulants: The patient has                            taken no anticoagulant or antiplatelet agents. ASA                            Grade Assessment: II - A patient with mild systemic                            disease. After reviewing the risks and benefits,                            the patient was deemed in satisfactory condition to                            undergo the procedure.                           After obtaining informed consent, the colonoscope  was passed under direct vision. Throughout the                            procedure, the patient's blood pressure, pulse, and                            oxygen saturations were monitored continuously. The                            Colonoscope was introduced through the anus and                            advanced to the cecum, identified by appendiceal                            orifice and  ileocecal valve. The colonoscopy was                            performed without difficulty. The patient tolerated                            the procedure well. The quality of the bowel                            preparation was good. The ileocecal valve,                            appendiceal orifice, and rectum were photographed. Scope In: 11:51:48 AM Scope Out: 12:03:14 PM Scope Withdrawal Time: 0 hours 8 minutes 11 seconds  Total Procedure Duration: 0 hours 11 minutes 26 seconds  Findings:                 The digital rectal exam was normal.                           A few small-mouthed diverticula were found in the                            ascending colon.                           Internal hemorrhoids were found during                            retroflexion. The hemorrhoids were small.                           The exam was otherwise without abnormality. Complications:            No immediate complications. Estimated Blood Loss:     Estimated blood loss: none. Impression:               - Mild diverticulosis in the ascending colon.                           - Small internal hemorrhoids.                           -  The examination was otherwise normal. No colonic                            polyps seen.                           - No specimens collected. Recommendation:           - Patient has a contact number available for                            emergencies. The signs and symptoms of potential                            delayed complications were discussed with the                            patient. Return to normal activities tomorrow.                            Written discharge instructions were provided to the                            patient.                           - Resume previous diet.                           - Continue present medications.                           - Repeat colonoscopy in 10 years for screening                            purposes. Nannette Babe, MD 03/13/2024 12:05:35 PM This report has been signed electronically.

## 2024-03-13 NOTE — Progress Notes (Signed)
 Vss nad trans to pacu

## 2024-03-13 NOTE — Patient Instructions (Signed)
Thank you for letting us take care of your healthcare needs today. Please see handouts given to you on Hemorrhoids and Diverticulosis.    YOU HAD AN ENDOSCOPIC PROCEDURE TODAY AT THE [facility]:   Refer to the procedure report that was given to you for any specific questions about what was found during the examination.  If the procedure report does not answer your questions, please call your gastroenterologist to clarify.  If you requested that your care partner not be given the details of your procedure findings, then the procedure report has been included in a sealed envelope for you to review at your convenience later.  YOU SHOULD EXPECT: Some feelings of bloating in the abdomen. Passage of more gas than usual.  Walking can help get rid of the air that was put into your GI tract during the procedure and reduce the bloating. If you had a lower endoscopy (such as a colonoscopy or flexible sigmoidoscopy) you may notice spotting of blood in your stool or on the toilet paper. If you underwent a bowel prep for your procedure, you may not have a normal bowel movement for a few days.  Please Note:  You might notice some irritation and congestion in your nose or some drainage.  This is from the oxygen used during your procedure.  There is no need for concern and it should clear up in a day or so.  SYMPTOMS TO REPORT IMMEDIATELY:  Following lower endoscopy (colonoscopy or flexible sigmoidoscopy):  Excessive amounts of blood in the stool  Significant tenderness or worsening of abdominal pains  Swelling of the abdomen that is new, acute  Fever of 100F or higher  For urgent or emergent issues, a gastroenterologist can be reached at any hour by calling [phone]. Do not use MyChart messaging for urgent concerns.    DIET:  We do recommend a small meal at first, but then you may proceed to your regular diet.  Drink plenty of fluids but you should avoid alcoholic beverages for 24  hours.  ACTIVITY:  You should plan to take it easy for the rest of today and you should NOT DRIVE or use heavy machinery until tomorrow (because of the sedation medicines used during the test).    FOLLOW UP: Our staff will call the number listed on your records the next business day following your procedure.  We will call around 7:15- 8:00 am to check on you and address any questions or concerns that you may have regarding the information given to you following your procedure. If we do not reach you, we will leave a message.     If any biopsies were taken you will be contacted by phone or by letter within the next 1-3 weeks.  Please call us at [phone] if you have not heard about the biopsies in 3 weeks.    SIGNATURES/CONFIDENTIALITY: You and/or your care partner have signed paperwork which will be entered into your electronic medical record.  These signatures attest to the fact that that the information above on your After Visit Summary has been reviewed and is understood.  Full responsibility of the confidentiality of this discharge information lies with you and/or your care-partner.  

## 2024-03-13 NOTE — Progress Notes (Signed)
 GASTROENTEROLOGY PROCEDURE H&P NOTE   Primary Care Physician: Collins Dean, NP    Reason for Procedure:   screening  Plan:    colonoscopy  Patient is appropriate for endoscopic procedure(s) in the ambulatory ([facility]) setting.  The nature of the procedure, as well as the risks, benefits, and alternatives were carefully and thoroughly reviewed with the patient. Ample time for discussion and questions allowed. The patient understood, was satisfied, and agreed to proceed.     HPI: Jody [name] is a 53 y.o. female who presents for colonoscopy.  Medical history as below.  Tolerated the prep.  No recent chest pain or shortness of breath.  No abdominal pain today.  Past Medical History:  Diagnosis Date   Anxiety and depression    Cystitis, interstitial    Elevated troponin    a. 09/2015: CP/elevated troponin up to 0.11 - unclear significance. CT neg for PE, LHC neg for CAD, normal echo.   Hypertension    Microcytic anemia    Noted on labs   Panic attack    Paranoid schizophrenia (HCC)    Tobacco abuse     Past Surgical History:  Procedure Laterality Date   CARDIAC CATHETERIZATION N/A 09/29/2015   Procedure: Left Heart Cath and Coronary Angiography;  Surgeon: Lucendia Rusk, MD;  Location: [facility] INVASIVE CV LAB;  Service: Cardiovascular;  Laterality: N/A;   TUBAL LIGATION      Prior to Admission medications   Medication Sig Start Date End Date Taking? Authorizing Provider  atorvastatin  (LIPITOR) 10 MG tablet Take 1 tablet (10 mg total) by mouth daily. Please schedule office visit for additional refills. 03/04/24  Yes Newlin, Enobong, MD  cloNIDine  (CATAPRES ) 0.1 MG tablet Take 1 tablet (0.1 mg total) by mouth at bedtime. 03/11/24  Yes Nwoko, Uchenna E, PA  Deutetrabenazine  ER (AUSTEDO  XR) 24 MG TB24 Take 24 mg by mouth daily. 01/08/24  Yes Nwoko, Uchenna E, PA  donepezil  (ARICEPT ) 5 MG tablet Take 1 tablet (5 mg total) by mouth at bedtime. 03/11/24  Yes Nwoko, Uchenna E,  PA  FLUoxetine  (PROZAC ) 40 MG capsule Take 1 capsule (40 mg total) by mouth daily. 03/11/24  Yes Nwoko, Uchenna E, PA  gabapentin  (NEURONTIN ) 100 MG capsule TAKE 1 CAPSULE BY MOUTH TWICE A DAY  ( IN THE MORNING & IN THE EVENING ) 03/04/24  Yes Collins Dean, NP  hydrochlorothiazide  (HYDRODIURIL ) 25 MG tablet TAKE 1 TABLET BY MOUTH DAILY 12/11/23  Yes Fleming, [name], NP  QUEtiapine  (SEROQUEL ) 400 MG tablet Take 1 tablet (400 mg total) by mouth at bedtime. 03/11/24  Yes Nwoko, Uchenna E, PA  Suvorexant  (BELSOMRA ) 10 MG TABS Take 1 tablet (10 mg total) by mouth at bedtime as needed. 03/14/24  Yes Nwoko, Uchenna E, PA  trimethoprim  (TRIMPEX ) 100 MG tablet Take 100 mg by mouth daily. 02/04/24  Yes [provider]  ferrous sulfate  (FEROSUL) 325 (65 FE) MG tablet Take 1 tablet (325 mg total) by mouth daily with breakfast. Please schedule office visit for additional refills. Patient not taking: Reported on 03/13/2024 03/04/24   Newlin, Enobong, MD    Current Outpatient Medications  Medication Sig Dispense Refill   atorvastatin  (LIPITOR) 10 MG tablet Take 1 tablet (10 mg total) by mouth daily. Please schedule office visit for additional refills. 28 tablet 0   cloNIDine  (CATAPRES ) 0.1 MG tablet Take 1 tablet (0.1 mg total) by mouth at bedtime. 90 tablet 0   Deutetrabenazine  ER (AUSTEDO  XR) 24 MG TB24  Take 24 mg by mouth daily. 30 tablet 2   donepezil  (ARICEPT ) 5 MG tablet Take 1 tablet (5 mg total) by mouth at bedtime. 30 tablet 2   FLUoxetine  (PROZAC ) 40 MG capsule Take 1 capsule (40 mg total) by mouth daily. 90 capsule 2   gabapentin  (NEURONTIN ) 100 MG capsule TAKE 1 CAPSULE BY MOUTH TWICE A DAY  ( IN THE MORNING & IN THE EVENING ) 56 capsule 1   hydrochlorothiazide  (HYDRODIURIL ) 25 MG tablet TAKE 1 TABLET BY MOUTH DAILY 28 tablet 2   QUEtiapine  (SEROQUEL ) 400 MG tablet Take 1 tablet (400 mg total) by mouth at bedtime. 30 tablet 2   [START ON 03/14/2024] Suvorexant  (BELSOMRA ) 10 MG TABS Take 1 tablet  (10 mg total) by mouth at bedtime as needed. 30 tablet 0   trimethoprim  (TRIMPEX ) 100 MG tablet Take 100 mg by mouth daily.     ferrous sulfate  (FEROSUL) 325 (65 FE) MG tablet Take 1 tablet (325 mg total) by mouth daily with breakfast. Please schedule office visit for additional refills. (Patient not taking: Reported on 03/13/2024) 28 tablet 0   Current Facility-Administered Medications  Medication Dose Route Frequency Provider Last Rate Last Admin   0.9 %  sodium chloride  infusion  500 mL Intravenous Once Epic Tribbett, Amber Bail, MD        Allergies as of 03/13/2024 - Review Complete 03/13/2024  Allergen Reaction Noted   Penicillins Itching and Rash 06/09/2012    Family History  Problem Relation Age of Onset   Colon polyps Mother    Hypertension Mother    Diabetes Father    Hypertension Sister    Lupus Sister    Heart disease Maternal Grandfather    Lupus Other    BRCA 1/2 Neg Hx    Breast cancer Neg Hx    Colon cancer Neg Hx    Rectal cancer Neg Hx    Stomach cancer Neg Hx    Esophageal cancer Neg Hx     Social History   Socioeconomic History   Marital status: Single    Spouse name: Not on file   Number of children: 5   Years of education: 12   Highest education level: Not on file  Occupational History   Occupation: Disabled  Tobacco Use   Smoking status: Former    Current packs/day: 0.00    Average packs/day: 0.3 packs/day for 4.0 years (1.0 ttl pk-yrs)    Types: Cigarettes    Start date: 05/01/2012    Quit date: 05/01/2016    Years since quitting: 7.8   Smokeless tobacco: Never  Vaping Use   Vaping status: Never Used  Substance and Sexual Activity   Alcohol use: Not Currently    Alcohol/week: 0.0 standard drinks of alcohol    Comment: Quit 2017   Drug use: No   Sexual activity: Not Currently    Birth control/protection: Condom  Other Topics Concern   Not on file  Social History Narrative   Lives alone   Caffeine use: Soda- daily   Right-handed   Social Drivers  of Health   Financial Resource Strain: Low Risk  (12/25/2023)   Overall Financial Resource Strain (CARDIA)    Difficulty of Paying Living Expenses: Not hard at all  Food Insecurity: No Food Insecurity (12/25/2023)   Hunger Vital Sign    Worried About Running Out of Food in the Last Year: Never true    Ran Out of Food in the Last Year: Never true  Transportation Needs:  No Transportation Needs (12/25/2023)   PRAPARE - Administrator, Civil Service (Medical): No    Lack of Transportation (Non-Medical): No  Physical Activity: Insufficiently Active (12/25/2023)   Exercise Vital Sign    Days of Exercise per Week: 1 day    Minutes of Exercise per Session: 20 min  Stress: No Stress Concern Present (12/25/2023)   [employer] of Occupational Health - Occupational Stress Questionnaire    Feeling of Stress : Not at all  Recent Concern: Stress - Stress Concern Present (11/09/2023)   [employer] of Occupational Health - Occupational Stress Questionnaire    Feeling of Stress : To some extent  Social Connections: Moderately Isolated (12/25/2023)   Social Connection and Isolation Panel    Frequency of Communication with Friends and Family: Once a week    Frequency of Social Gatherings with Friends and Family: Once a week    Attends Religious Services: 1 to 4 times per year    Active Member of [employer] or Organizations: Yes    Attends Banker Meetings: 1 to 4 times per year    Marital Status: Divorced  Intimate Partner Violence: Not At Risk (12/25/2023)   Humiliation, Afraid, Rape, and Kick questionnaire    Fear of Current or Ex-Partner: No    Emotionally Abused: No    Physically Abused: No    Sexually Abused: No    Physical Exam: Vital signs in last 24 hours: @BP  114/64   Pulse 74   Temp 98 F (36.7 C) (Temporal)   Ht 5' 4 (1.626 m)   Wt 188 lb (85.3 kg)   LMP  (LMP Unknown)   SpO2 100%   BMI 32.27 kg/m  GEN: NAD EYE: Sclerae anicteric ENT: MMM CV:  Non-tachycardic Pulm: CTA b/l GI: Soft, NT/ND NEURO:  Alert & Oriented x 3   Laurell Pond, MD [facility]  03/13/2024 11:43 AM

## 2024-03-13 NOTE — Progress Notes (Signed)
 Pt's states no medical or surgical changes since previsit or office visit.

## 2024-03-14 ENCOUNTER — Encounter: Payer: Self-pay | Admitting: Nurse Practitioner

## 2024-03-14 ENCOUNTER — Ambulatory Visit: Attending: Nurse Practitioner | Admitting: Nurse Practitioner

## 2024-03-14 ENCOUNTER — Telehealth: Payer: Self-pay

## 2024-03-14 VITALS — BP 110/72 | HR 84 | Resp 19 | Ht 64.0 in | Wt 189.0 lb

## 2024-03-14 DIAGNOSIS — E7800 Pure hypercholesterolemia, unspecified: Secondary | ICD-10-CM | POA: Diagnosis not present

## 2024-03-14 DIAGNOSIS — Z6832 Body mass index (BMI) 32.0-32.9, adult: Secondary | ICD-10-CM | POA: Diagnosis not present

## 2024-03-14 DIAGNOSIS — I10 Essential (primary) hypertension: Secondary | ICD-10-CM | POA: Insufficient documentation

## 2024-03-14 DIAGNOSIS — Z8249 Family history of ischemic heart disease and other diseases of the circulatory system: Secondary | ICD-10-CM | POA: Diagnosis not present

## 2024-03-14 DIAGNOSIS — Z79899 Other long term (current) drug therapy: Secondary | ICD-10-CM | POA: Insufficient documentation

## 2024-03-14 DIAGNOSIS — D649 Anemia, unspecified: Secondary | ICD-10-CM | POA: Diagnosis not present

## 2024-03-14 DIAGNOSIS — E669 Obesity, unspecified: Secondary | ICD-10-CM | POA: Insufficient documentation

## 2024-03-14 MED ORDER — ATORVASTATIN CALCIUM 10 MG PO TABS
10.0000 mg | ORAL_TABLET | Freq: Every day | ORAL | 3 refills | Status: DC
Start: 2024-03-14 — End: 2024-07-17

## 2024-03-14 MED ORDER — FERROUS SULFATE 325 (65 FE) MG PO TABS: 325.0000 mg | ORAL_TABLET | Freq: Every day | ORAL | 3 refills | Status: DC

## 2024-03-14 MED ORDER — TIRZEPATIDE 2.5 MG/0.5ML ~~LOC~~ SOAJ: 2.5000 mg | SUBCUTANEOUS | 1 refills | Status: DC

## 2024-03-14 NOTE — Telephone Encounter (Signed)
 Post procedure follow up call, no answer

## 2024-03-14 NOTE — Progress Notes (Signed)
 Assessment & Plan:  Jody [name] was seen today for hypertension.  Diagnoses and all orders for this visit:  Obesity (BMI 30-39.9) -     tirzepatide (MOUNJARO) 2.5 MG/0.5ML Pen; Inject 2.5 mg into the skin once a week. Discussed diet and exercise for person with BMI >30. Instructed: You must burn more calories than you eat. Losing 5 percent of your body weight should be considered a success. In the longer term, losing more than 15 percent of your body weight and staying at this weight is an extremely good result. However, keep in mind that even losing 5 percent of your body weight leads to important health benefits, so try not to get discouraged if you're not able to lose more than this. Will recheck weight in 3-6 months.   Hypercholesterolemia -     atorvastatin  (LIPITOR) 10 MG tablet; Take 1 tablet (10 mg total) by mouth daily. Please schedule office visit for additional refills.  Anemia, unspecified type -     ferrous sulfate  (FEROSUL) 325 (65 FE) MG tablet; Take 1 tablet (325 mg total) by mouth daily with breakfast.    Patient has been counseled on age-appropriate routine health concerns for screening and prevention. These are reviewed and up-to-date. Referrals have been placed accordingly. Immunizations are up-to-date or declined.    Subjective:   Chief Complaint  Patient presents with   Hypertension    Jody [name] 53 y.o. female presents to office today for follow up to HTN   She has a past medical history of Anxiety and depression, Cystitis, interstitial, Elevated troponin, Hypertension, Microcytic anemia, Panic attack, Paranoid schizophrenia, and Tobacco abuse.   Blood pressure is well-controlled with hydrochlorothiazide  20 mg daily. BP Readings from Last 3 Encounters:  03/14/24 110/72  03/13/24 129/87  11/09/23 117/85    On treadmill 3 days for 1 hour No calorie coutning. Trying to heat hearlheier food options. Struggesl with cravings.   Jody [name] is obese  and we are requesting Mounjaro for weight management today.  The patient is new to therapy and is 53 years old with a BMI of 32.44 today in office. She has a co morbid history of : Primary hypertension for which we are managing with an antihypertensive.  The patient is currently on and will continue lifestyle modification including structured nutrition and physical activity at the time GLP1 therapy commences and she will NOT be using the requested agent in combination with another GLP-1  Jody [name] does NOT have any FDA-labeled contraindications to the requested agent, including history of medullary thyroid  cancer or  multiple endocrine neoplasia type II Current weight loss activities she has implemented include: Walking on the treadmill 3 times per week for 1 hour, reduced caloric diet  Review of Systems  Constitutional:  Negative for fever, malaise/fatigue and weight loss.  HENT: Negative.  Negative for nosebleeds.   Eyes: Negative.  Negative for blurred vision, double vision and photophobia.  Respiratory: Negative.  Negative for cough and shortness of breath.   Cardiovascular: Negative.  Negative for chest pain, palpitations and leg swelling.  Gastrointestinal: Negative.  Negative for heartburn, nausea and vomiting.  Musculoskeletal: Negative.  Negative for myalgias.  Neurological: Negative.  Negative for dizziness, focal weakness, seizures and headaches.  Psychiatric/Behavioral: Negative.  Negative for suicidal ideas.     Past Medical History:  Diagnosis Date   Anxiety and depression    Cystitis, interstitial    Elevated troponin    a. 09/2015: CP/elevated troponin up to 0.11 - unclear significance.  CT neg for PE, LHC neg for CAD, normal echo.   Hypertension    Microcytic anemia    Noted on labs   Panic attack    Paranoid schizophrenia (HCC)    Tobacco abuse     Past Surgical History:  Procedure Laterality Date   CARDIAC CATHETERIZATION N/A 09/29/2015   Procedure: Left Heart  Cath and Coronary Angiography;  Surgeon: Lucendia Rusk, MD;  Location: [facility] INVASIVE CV LAB;  Service: Cardiovascular;  Laterality: N/A;   TUBAL LIGATION      Family History  Problem Relation Age of Onset   Colon polyps Mother    Hypertension Mother    Diabetes Father    Hypertension Sister    Lupus Sister    Heart disease Maternal Grandfather    Lupus Other    BRCA 1/2 Neg Hx    Breast cancer Neg Hx    Colon cancer Neg Hx    Rectal cancer Neg Hx    Stomach cancer Neg Hx    Esophageal cancer Neg Hx     Social History Reviewed with no changes to be made today.   Outpatient Medications Prior to Visit  Medication Sig Dispense Refill   cloNIDine  (CATAPRES ) 0.1 MG tablet Take 1 tablet (0.1 mg total) by mouth at bedtime. 90 tablet 0   Deutetrabenazine  ER (AUSTEDO  XR) 24 MG TB24 Take 24 mg by mouth daily. 30 tablet 2   donepezil  (ARICEPT ) 5 MG tablet Take 1 tablet (5 mg total) by mouth at bedtime. 30 tablet 2   FLUoxetine  (PROZAC ) 40 MG capsule Take 1 capsule (40 mg total) by mouth daily. 90 capsule 2   gabapentin  (NEURONTIN ) 100 MG capsule TAKE 1 CAPSULE BY MOUTH TWICE A DAY  ( IN THE MORNING & IN THE EVENING ) 56 capsule 1   hydrochlorothiazide  (HYDRODIURIL ) 25 MG tablet TAKE 1 TABLET BY MOUTH DAILY 28 tablet 2   QUEtiapine  (SEROQUEL ) 400 MG tablet Take 1 tablet (400 mg total) by mouth at bedtime. 30 tablet 2   Suvorexant  (BELSOMRA ) 10 MG TABS Take 1 tablet (10 mg total) by mouth at bedtime as needed. 30 tablet 0   trimethoprim  (TRIMPEX ) 100 MG tablet Take 100 mg by mouth daily.     atorvastatin  (LIPITOR) 10 MG tablet Take 1 tablet (10 mg total) by mouth daily. Please schedule office visit for additional refills. 28 tablet 0   ferrous sulfate  (FEROSUL) 325 (65 FE) MG tablet Take 1 tablet (325 mg total) by mouth daily with breakfast. Please schedule office visit for additional refills. 28 tablet 0   Facility-Administered Medications Prior to Visit  Medication Dose Route Frequency  Provider Last Rate Last Admin   0.9 %  sodium chloride  infusion  500 mL Intravenous Once Pyrtle, Amber Bail, MD        Allergies  Allergen Reactions   Penicillins Itching and Rash       Objective:    BP 110/72 (BP Location: Left Arm, Patient Position: Sitting, Cuff Size: Normal)   Pulse 84   Resp 19   Ht 5' 4 (1.626 m)   Wt 189 lb (85.7 kg)   LMP  (LMP Unknown)   SpO2 99%   BMI 32.44 kg/m  Wt Readings from Last 3 Encounters:  03/14/24 189 lb (85.7 kg)  03/13/24 188 lb (85.3 kg)  02/26/24 192 lb (87.1 kg)    Physical Exam Vitals and nursing note reviewed.  Constitutional:      Appearance: She is well-developed.  HENT:  Head: Normocephalic and atraumatic.   Cardiovascular:     Rate and Rhythm: Normal rate and regular rhythm.     Heart sounds: Normal heart sounds. No murmur heard.    No friction rub. No gallop.  Pulmonary:     Effort: Pulmonary effort is normal. No tachypnea or respiratory distress.     Breath sounds: Normal breath sounds. No decreased breath sounds, wheezing, rhonchi or rales.  Chest:     Chest wall: No tenderness.  Abdominal:     General: Bowel sounds are normal.     Palpations: Abdomen is soft.   Musculoskeletal:        General: Normal range of motion.     Cervical back: Normal range of motion.   Skin:    General: Skin is warm and dry.   Neurological:     Mental Status: She is alert and oriented to person, place, and time.     Coordination: Coordination normal.   Psychiatric:        Behavior: Behavior normal. Behavior is cooperative.        Thought Content: Thought content normal.        Judgment: Judgment normal.          Patient has been counseled extensively about nutrition and exercise as well as the importance of adherence with medications and regular follow-up. The patient was given clear instructions to go to ER or return to medical center if symptoms don't improve, worsen or new problems develop. The patient verbalized  understanding.   Follow-up: Return in about 3 months (around 06/14/2024).   Collins Dean, FNP-BC [facility] [place], [place] [phone]   03/14/2024, 12:23 PM

## 2024-03-18 ENCOUNTER — Other Ambulatory Visit: Payer: Self-pay

## 2024-03-20 ENCOUNTER — Other Ambulatory Visit: Payer: Self-pay

## 2024-03-21 ENCOUNTER — Other Ambulatory Visit: Payer: Self-pay

## 2024-03-21 ENCOUNTER — Telehealth: Payer: Self-pay

## 2024-03-21 MED ORDER — TIRZEPATIDE-WEIGHT MANAGEMENT 2.5 MG/0.5ML ~~LOC~~ SOLN: 2.5000 mg | SUBCUTANEOUS | 0 refills | Status: AC

## 2024-03-21 NOTE — Telephone Encounter (Signed)
 Pharmacy Patient Advocate Encounter  Received notification from [facility] Medicaid that Prior Authorization for [facility] has been DENIED.  Full denial letter will be uploaded to the media tab. See denial reason below.

## 2024-03-21 NOTE — Telephone Encounter (Signed)
Lets try Zepbound

## 2024-03-21 NOTE — Addendum Note (Signed)
 Addended by: Concetta Dee B on: 03/21/2024 07:00 PM   Modules accepted: Orders

## 2024-03-25 ENCOUNTER — Other Ambulatory Visit: Payer: Self-pay

## 2024-03-26 ENCOUNTER — Other Ambulatory Visit (HOSPITAL_COMMUNITY): Payer: Self-pay | Admitting: Physician Assistant

## 2024-03-26 ENCOUNTER — Other Ambulatory Visit: Payer: Self-pay | Admitting: Nurse Practitioner

## 2024-03-26 ENCOUNTER — Other Ambulatory Visit: Payer: Self-pay

## 2024-03-26 DIAGNOSIS — I10 Essential (primary) hypertension: Secondary | ICD-10-CM

## 2024-03-26 DIAGNOSIS — F333 Major depressive disorder, recurrent, severe with psychotic symptoms: Secondary | ICD-10-CM

## 2024-03-26 NOTE — Telephone Encounter (Signed)
 Mounjaro  and Zepbound  have been denied by patient's prescription benefits coverage and the denial letters uploaded to media. Her Part D plan excludes coverage for weight loss medications and covers Mounjaro  and alternatives with type 2 diabetes diagnosis or established ASCVD

## 2024-03-27 ENCOUNTER — Telehealth (HOSPITAL_COMMUNITY): Payer: Self-pay

## 2024-03-31 ENCOUNTER — Other Ambulatory Visit (HOSPITAL_COMMUNITY): Payer: Self-pay | Admitting: Physician Assistant

## 2024-03-31 ENCOUNTER — Telehealth (HOSPITAL_COMMUNITY): Payer: Self-pay

## 2024-03-31 DIAGNOSIS — F333 Major depressive disorder, recurrent, severe with psychotic symptoms: Secondary | ICD-10-CM

## 2024-03-31 DIAGNOSIS — F251 Schizoaffective disorder, depressive type: Secondary | ICD-10-CM

## 2024-03-31 MED ORDER — MIRTAZAPINE 15 MG PO TABS: 15.0000 mg | ORAL_TABLET | Freq: Every day | ORAL | 1 refills | Status: DC

## 2024-03-31 MED ORDER — MIRTAZAPINE 7.5 MG PO TABS: 7.5000 mg | ORAL_TABLET | Freq: Every day | ORAL | 0 refills | Status: DC

## 2024-03-31 NOTE — Telephone Encounter (Signed)
 Message acknowledged and reviewed. Provider was able to speak with patient regarding her concerns. See note.

## 2024-03-31 NOTE — Progress Notes (Signed)
 Provider was contacted by Arland PHEBE Pander, CMA regarding patient's request to discuss medication regimen.  Provider was able to reach out to patient.  Patient informed provider that her sleep has worsened since discontinuing mirtazapine .  She reports that she is now receiving roughly 3 hours of sleep per night and states that her mood has been directly affected by her lack of sleep.  Patient is interested in restarting her mirtazapine  prescription for the management of her mood and sleep.  Provider to place patient on mirtazapine  7.5 mg at bedtime for 6 days then continue taking mirtazapine  15 mg at bedtime for the management of her sleep and for mood stability.  Patient was agreeable to recommendation.  Patient's medication to be e-prescribed to pharmacy of choice.

## 2024-03-31 NOTE — Telephone Encounter (Signed)
 Message acknowledged and reviewed. Provider was able to reach out to patient. See note.

## 2024-03-31 NOTE — Telephone Encounter (Signed)
 Patient's sister called in regards to patient having trouble sleeping. Patient is currently taking Suvorexant  10mg . Sister is requesting an increase as patient is running out of current  prescription.

## 2024-04-06 IMAGING — MG DIGITAL DIAGNOSTIC BILAT W/ TOMO W/ CAD
8 of 14 series · 8 of 40 positions shown · non-contrast
Comparison: Previous exam(s).

CLINICAL DATA: Focal waxing and waning pain in the UPPER portion of
the LEFT breast. Patient also reports non spontaneous bilateral
beige discharge. Patient takes risperidone.

EXAM:
DIGITAL DIAGNOSTIC BILATERAL MAMMOGRAM WITH TOMOSYNTHESIS AND CAD;
ULTRASOUND LEFT BREAST LIMITED
TECHNIQUE: Bilateral digital diagnostic mammography and breast tomosynthesis
was performed. The images were evaluated with computer-aided
detection.; Targeted ultrasound examination of the left breast was
performed.

[R MLO synth-2D (1 of 2)]
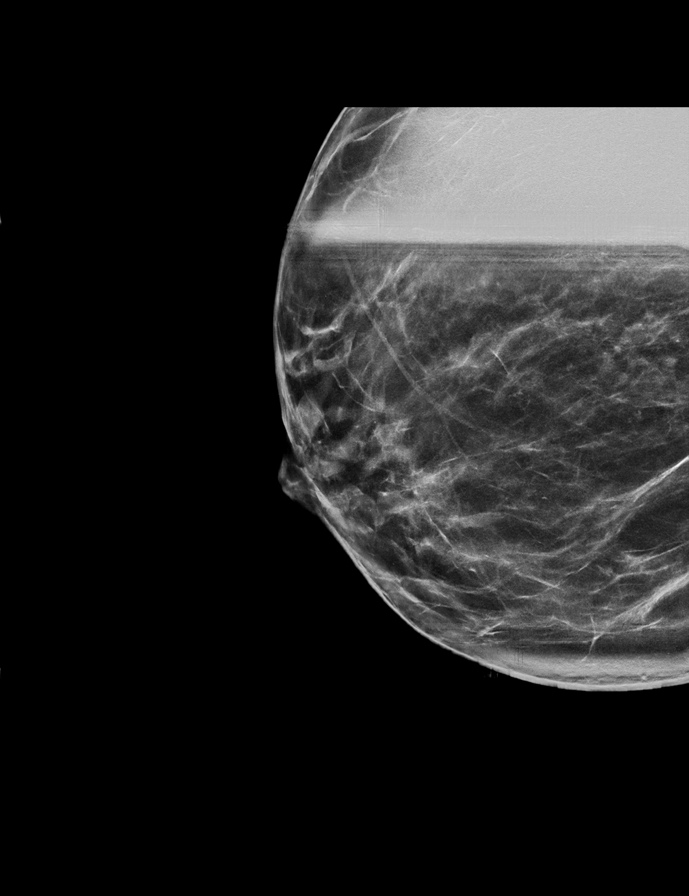

[L MLO synth-2D (1 of 2)]
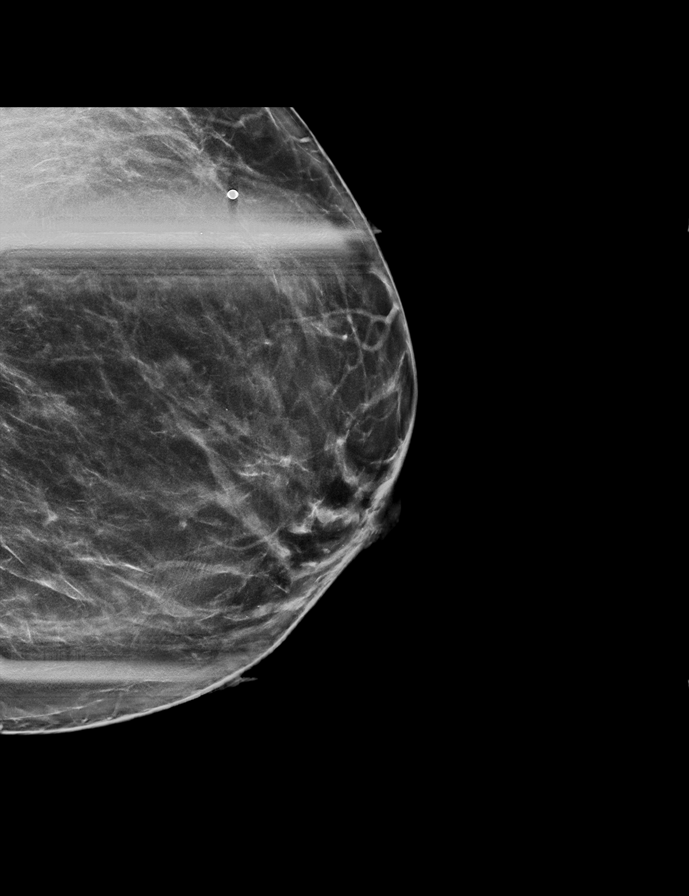

[L MLO synth-2D (2 of 2)]
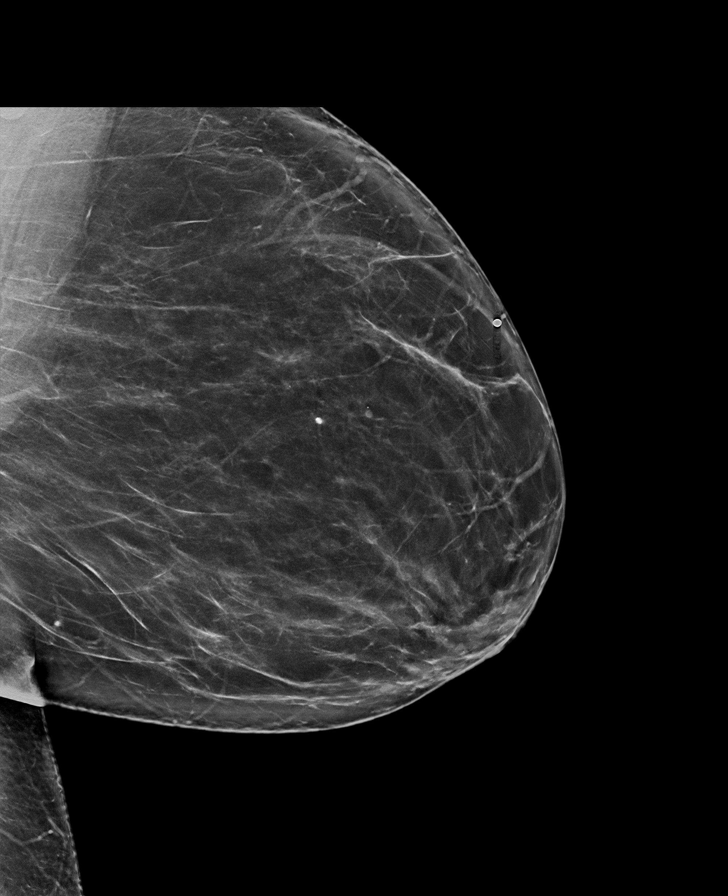

[R MLO synth-2D (2 of 2)]
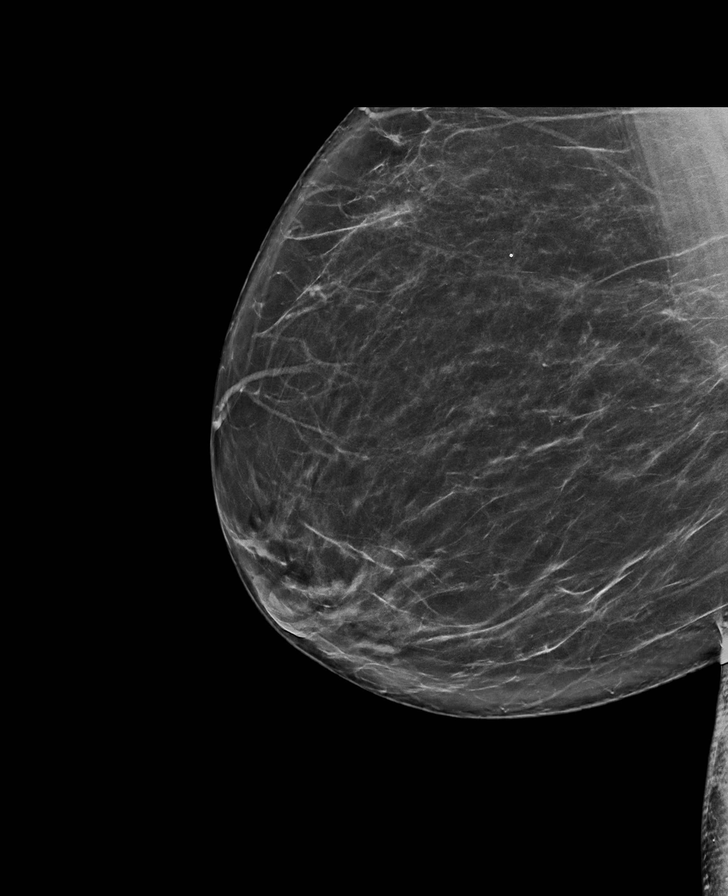

[L TAN synth-2D]
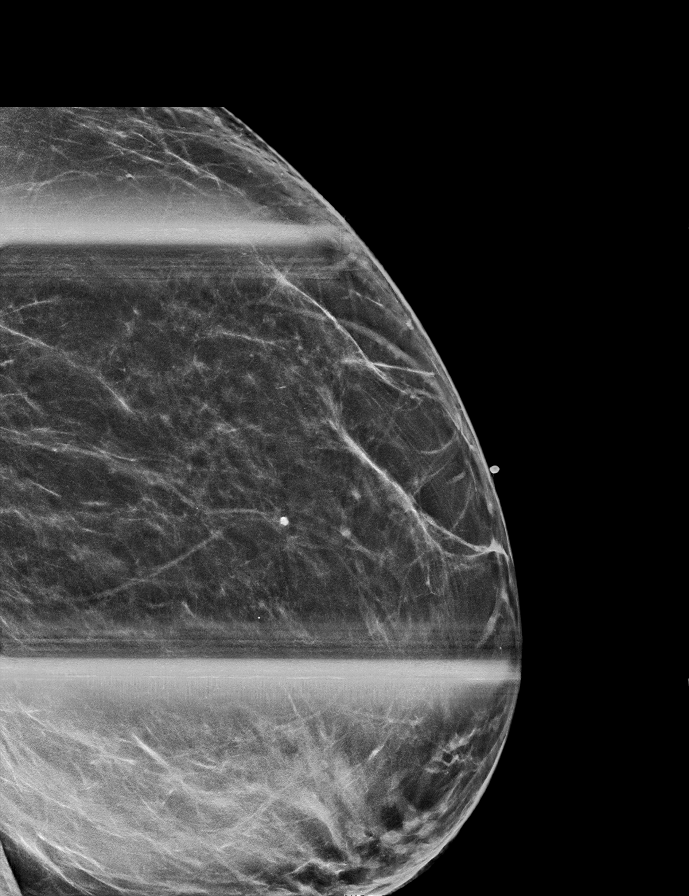

[L CC synth-2D]
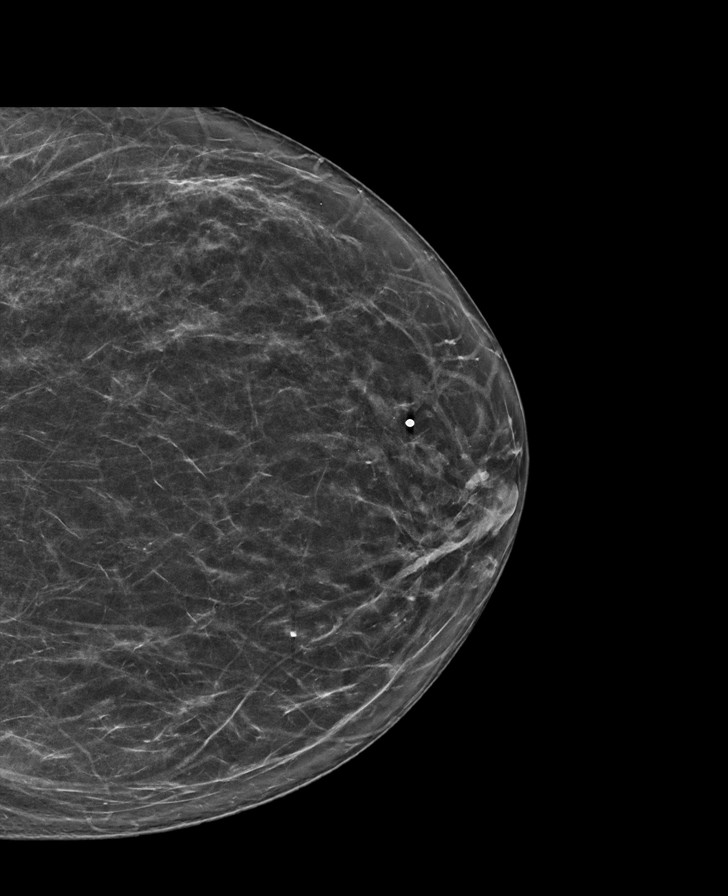

[R CC synth-2D]
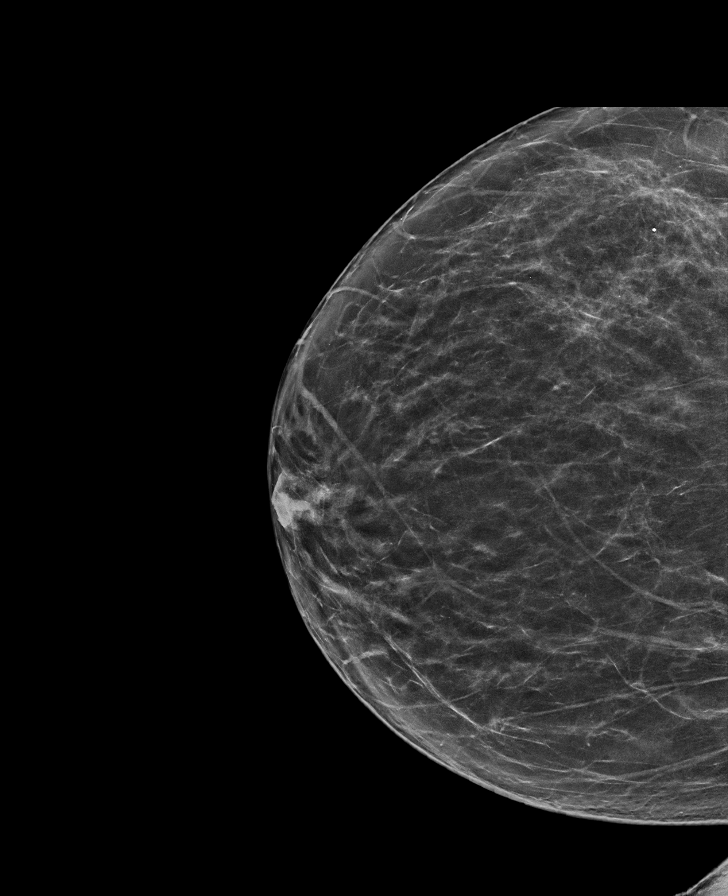

[R CC tomo · tomo slice 39/78.0]
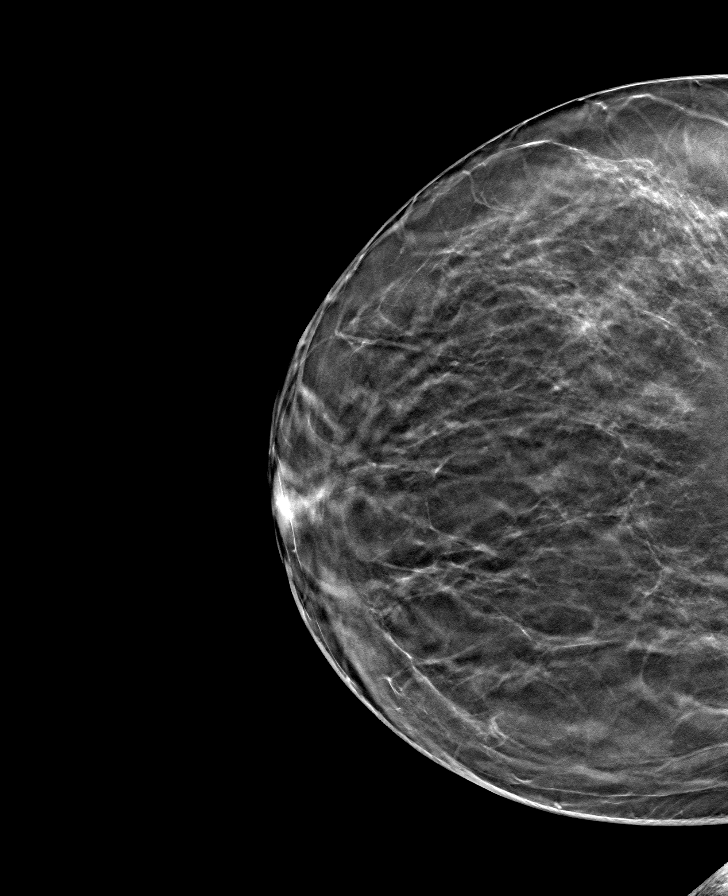

[8 of 40 positions shown; findings below may reference images not displayed]

ACR Breast Density Category b: There are scattered areas of
fibroglandular density.
FINDINGS: No suspicious mass, distortion, or microcalcifications are
identified to suggest presence of malignancy. Spot tangential view
is performed in the area of concern in the UPPER LEFT breast,
demonstrating normal appearing fibroglandular tissue.

Spot compression views of the retroareolar regions bilaterally show
normal appearing fibrofatty tissue.

Targeted ultrasound is performed, showing normal appearing
fibroglandular tissue in the UPPER central LEFT breast. No
suspicious mass, distortion, or acoustic shadowing is demonstrated
with ultrasound.
IMPRESSION: No mammographic or ultrasound evidence for malignancy.

Benign non spontaneous bilateral nipple discharge without evidence
for associated malignancy and possibly related to risperidone.

RECOMMENDATION:
Screening mammogram in one year.(Code:31-Q-BDB)

I have discussed the findings and recommendations with the patient.
If applicable, a reminder letter will be sent to the patient
regarding the next appointment.

BI-RADS CATEGORY  2: Benign.

## 2024-04-06 IMAGING — US US BREAST*L* LIMITED INC AXILLA
1 series · 4 of 4 positions shown · non-contrast
Comparison: Previous exam(s).

CLINICAL DATA: Focal waxing and waning pain in the UPPER portion of
the LEFT breast. Patient also reports non spontaneous bilateral
beige discharge. Patient takes risperidone.

EXAM:
DIGITAL DIAGNOSTIC BILATERAL MAMMOGRAM WITH TOMOSYNTHESIS AND CAD;
ULTRASOUND LEFT BREAST LIMITED
TECHNIQUE: Bilateral digital diagnostic mammography and breast tomosynthesis
was performed. The images were evaluated with computer-aided
detection.; Targeted ultrasound examination of the left breast was
performed.

[Series 1: us breast*left* limited inc axilla · 0.09mm/px · 4 of 4 slices shown]
[im 1/4]
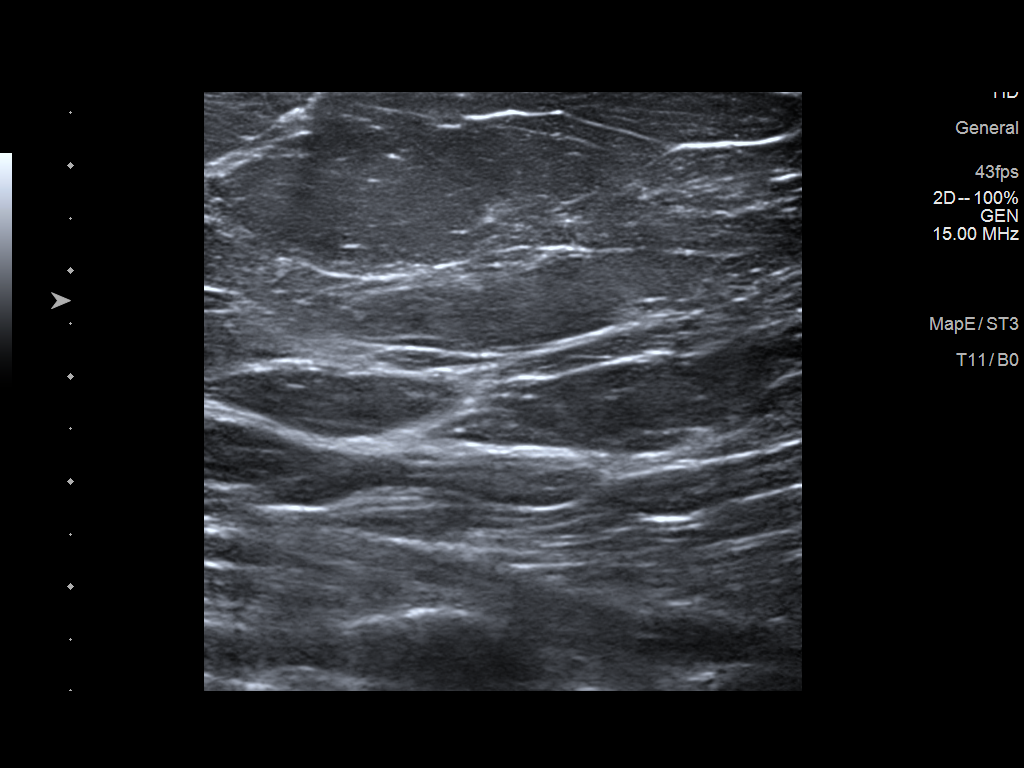
[im 2/4]
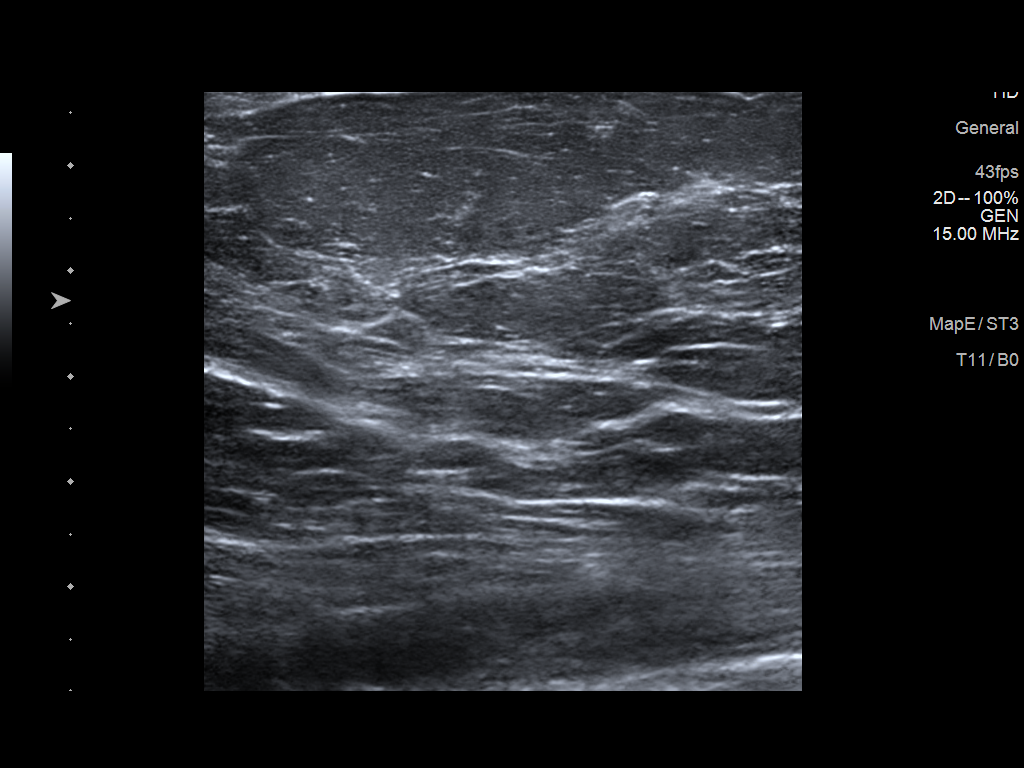
[im 3/4]
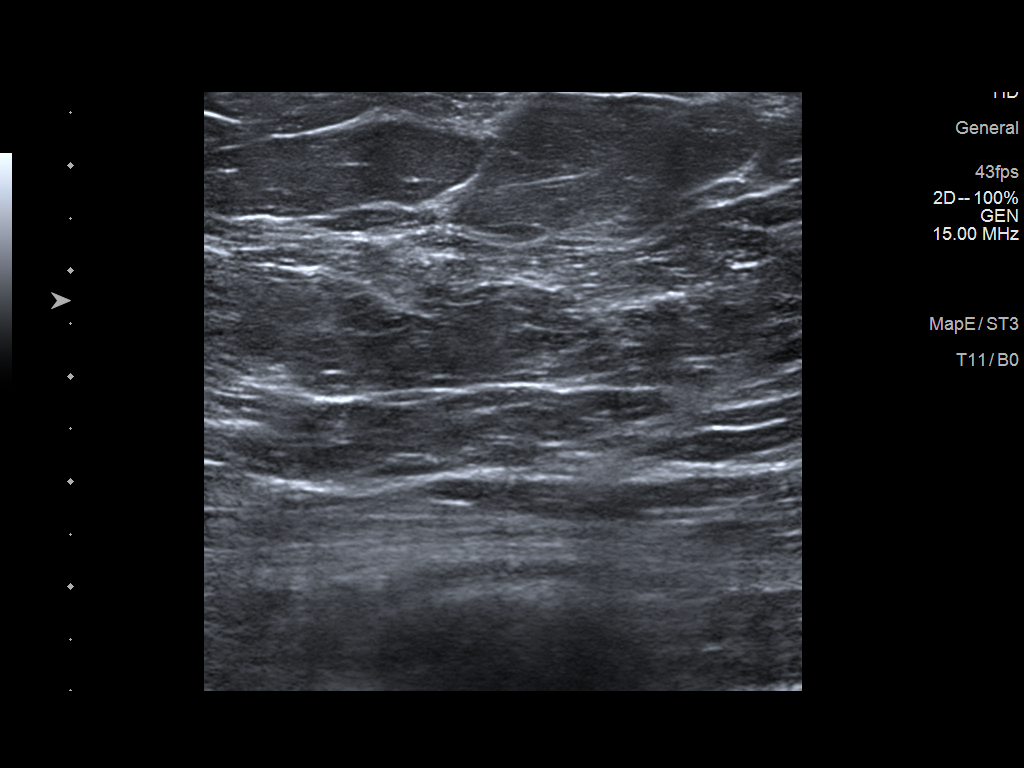
[im 4/4]
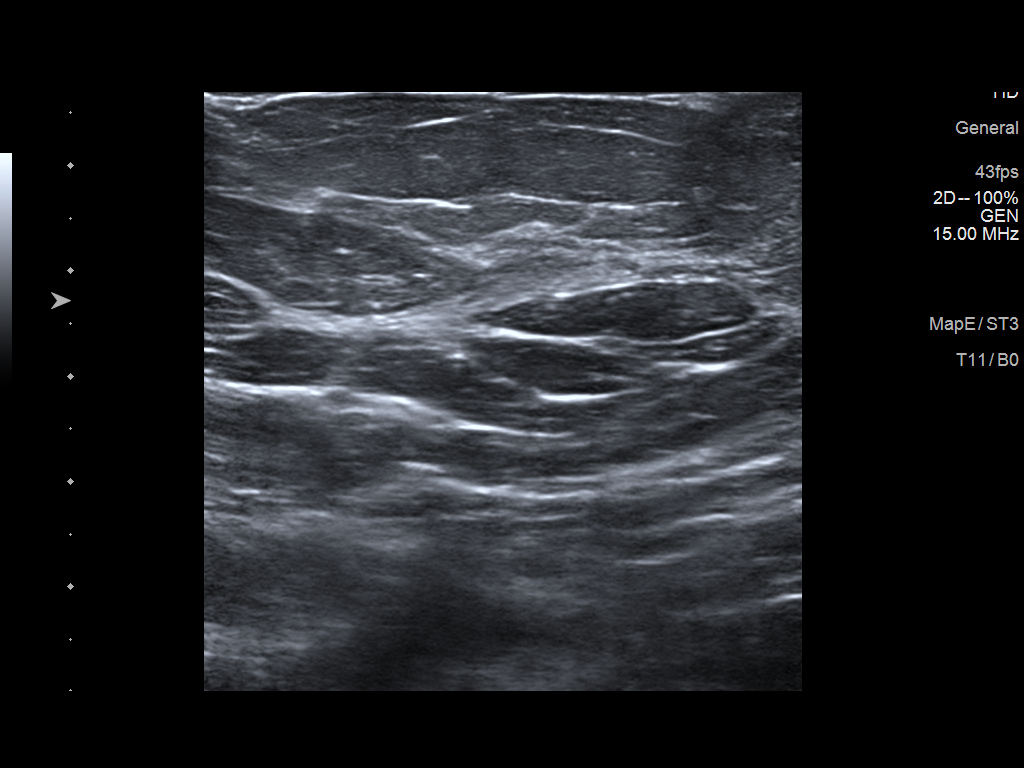

[4 of 4 positions shown; findings below may reference images not displayed]

ACR Breast Density Category b: There are scattered areas of
fibroglandular density.
FINDINGS: No suspicious mass, distortion, or microcalcifications are
identified to suggest presence of malignancy. Spot tangential view
is performed in the area of concern in the UPPER LEFT breast,
demonstrating normal appearing fibroglandular tissue.

Spot compression views of the retroareolar regions bilaterally show
normal appearing fibrofatty tissue.

Targeted ultrasound is performed, showing normal appearing
fibroglandular tissue in the UPPER central LEFT breast. No
suspicious mass, distortion, or acoustic shadowing is demonstrated
with ultrasound.
IMPRESSION: No mammographic or ultrasound evidence for malignancy.

Benign non spontaneous bilateral nipple discharge without evidence
for associated malignancy and possibly related to risperidone.

RECOMMENDATION:
Screening mammogram in one year.(Code:31-[id number])

I have discussed the findings and recommendations with the patient.
If applicable, a reminder letter will be sent to the patient
regarding the next appointment.

BI-RADS CATEGORY  2: Benign.

## 2024-04-22 ENCOUNTER — Ambulatory Visit (INDEPENDENT_AMBULATORY_CARE_PROVIDER_SITE_OTHER): Admitting: Physician Assistant

## 2024-04-22 ENCOUNTER — Other Ambulatory Visit (HOSPITAL_COMMUNITY): Payer: Self-pay | Admitting: Physician Assistant

## 2024-04-22 VITALS — BP 123/81 | HR 83 | Ht 64.0 in | Wt 189.4 lb

## 2024-04-22 DIAGNOSIS — F251 Schizoaffective disorder, depressive type: Secondary | ICD-10-CM

## 2024-04-22 DIAGNOSIS — F0390 Unspecified dementia without behavioral disturbance: Secondary | ICD-10-CM

## 2024-04-22 DIAGNOSIS — Z79899 Other long term (current) drug therapy: Secondary | ICD-10-CM

## 2024-04-22 DIAGNOSIS — F411 Generalized anxiety disorder: Secondary | ICD-10-CM

## 2024-04-22 DIAGNOSIS — G479 Sleep disorder, unspecified: Secondary | ICD-10-CM | POA: Diagnosis not present

## 2024-04-22 DIAGNOSIS — F333 Major depressive disorder, recurrent, severe with psychotic symptoms: Secondary | ICD-10-CM

## 2024-04-22 DIAGNOSIS — G2401 Drug induced subacute dyskinesia: Secondary | ICD-10-CM | POA: Diagnosis not present

## 2024-04-22 MED ORDER — QUETIAPINE FUMARATE 400 MG PO TABS: 400.0000 mg | ORAL_TABLET | Freq: Every day | ORAL | 2 refills | Status: DC

## 2024-04-22 MED ORDER — BELSOMRA 10 MG PO TABS: 10.0000 mg | ORAL_TABLET | Freq: Every evening | ORAL | 0 refills | Status: DC | PRN

## 2024-04-22 MED ORDER — MIRTAZAPINE 15 MG PO TABS: 15.0000 mg | ORAL_TABLET | Freq: Every day | ORAL | 2 refills | Status: DC

## 2024-04-22 MED ORDER — FLUOXETINE HCL 40 MG PO CAPS: 40.0000 mg | ORAL_CAPSULE | Freq: Every day | ORAL | 2 refills | Status: DC

## 2024-04-22 MED ORDER — AUSTEDO XR 24 MG PO TB24
24.0000 mg | ORAL_TABLET | Freq: Every day | ORAL | 2 refills | Status: DC
Start: 2024-04-22 — End: 2024-06-05

## 2024-04-23 ENCOUNTER — Encounter (HOSPITAL_COMMUNITY): Payer: Self-pay | Admitting: Physician Assistant

## 2024-04-23 DIAGNOSIS — Z79899 Other long term (current) drug therapy: Secondary | ICD-10-CM | POA: Insufficient documentation

## 2024-04-23 MED ORDER — DONEPEZIL HCL 5 MG PO TABS: 5.0000 mg | ORAL_TABLET | Freq: Every day | ORAL | 2 refills | Status: DC

## 2024-04-23 NOTE — Progress Notes (Signed)
 BH MD/PA/NP OP Progress Note  04/22/2024 3:30 PM LEWIS GRIVAS  MRN:  [id number]  Chief Complaint:  Chief Complaint  Patient presents with   Follow-up   Medication Refill   HPI:   Jody [name] is a 53 year old, African-American female with a past psychiatric history significant for schizoaffective disorder (depressed type), major depressive disorder, generalized anxiety disorder, sleep disturbances, and major neurocognitive disorder who presents to [facility] for follow up and medication management. Patient is currently being managed on the following psychiatric medications:  Seroquel  400 mg at bedtime Prozac  40 mg daily Belsomra  10 mg at bedtime Austedo  XR 24 mg daily Donepezil  5 mg daily Mirtazapine  15 mg at bedtime  Patient presents to the encounter stating that she is taking her medications regularly and denies experiencing any adverse side effects.  Patient still continues to endorse some depression she rates a 4 out of 10 with 10 being most severe.  Patient endorses depressive episodes 2 days/week.  Patient endorses the following depressive symptoms: feelings of sadness, lack of motivation, decreased concentration, irritability, decreased energy, feelings of guilt/worthlessness, and hopelessness.  Patient denies crying spells.  Patient continues to endorse some anxiety but states that her symptoms are mostly manageable.  Patient denies any changes in her behavior; however, she does experience some paranoia.  She reports that her paranoia is attributed to specific colored cards following her.  She reports that there is always a gray or blue car that passes her while she is on her way to her sister's house.  Patient believes that the cars are trying to tell her something.  A PHQ-9 screen was performed with the patient scoring a 13.  A GAD-7 screen was also performed with the patient scoring a 15.  Patient is alert and oriented x 4, calm,  cooperative, and fully engaged in conversation during the encounter.  Patient endorses fair mood.  Patient exhibits depressed mood with appropriate affect.  Patient denies suicidal or homicidal ideations.  She further denies auditory or visual hallucinations and does not appear to be responding to internal/external stimuli.  Patient endorses fair sleep and receives on average 5 to 6 hours of sleep per night.  Patient endorses fair appetite needs on average 2 meals per day.  Patient denies alcohol consumption or illicit drug use.  Patient endorses tobacco use and smokes on average to 8 cigarettes/day.  Visit Diagnosis:    ICD-10-CM   1. Schizoaffective disorder, depressive type (HCC)  F25.1 QUEtiapine  (SEROQUEL ) 400 MG tablet    mirtazapine  (REMERON ) 15 MG tablet    Deutetrabenazine  ER (AUSTEDO  XR) 24 MG TB24    2. Major neurocognitive disorder (HCC)  F03.90 FLUoxetine  (PROZAC ) 40 MG capsule    donepezil  (ARICEPT ) 5 MG tablet    3. MDD (major depressive disorder), recurrent, severe, with psychosis (HCC)  F33.3 FLUoxetine  (PROZAC ) 40 MG capsule    4. Sleep disturbances  G47.9 Suvorexant  (BELSOMRA ) 10 MG TABS    5. Tardive dyskinesia  G24.01 Deutetrabenazine  ER (AUSTEDO  XR) 24 MG TB24      Past Psychiatric History:  Schizophrenia - diagnosed roughly 7 years ago when patient was attending [facility] Patient was diagnosed with schizoaffective disorder (depressive type) when admitted to [facility] from 04/08/2022 - 04/12/2022.  In addition to being diagnosed with schizoaffective disorder, patient was treated for insomnia, generalized anxiety disorder, and tardive dyskinesia.   **Onset of dementia, major neurocognitive disorder, due to Alzheimer's disease, without behavioral disturbance, mild  Past Medical History:  Past Medical History:  Diagnosis Date   Anxiety and depression    Cystitis, interstitial    Elevated troponin    a. 09/2015: CP/elevated troponin up to  0.11 - unclear significance. CT neg for PE, LHC neg for CAD, normal echo.   Hypertension    Microcytic anemia    Noted on labs   Panic attack    Paranoid schizophrenia (HCC)    Tobacco abuse     Past Surgical History:  Procedure Laterality Date   CARDIAC CATHETERIZATION N/A 09/29/2015   Procedure: Left Heart Cath and Coronary Angiography;  Surgeon: Candyce GORMAN Reek, MD;  Location: [facility] INVASIVE CV LAB;  Service: Cardiovascular;  Laterality: N/A;   TUBAL LIGATION      Family Psychiatric History:  Grandmother (maternal) - unsure of the diagnosis but states that her grandmother had mental health issues Uncle (maternal) - Schizophrenia and bipolar disorder, he is currently taking medications but is unsure of the type.   Family history of suicide: Patient denies Family history of homicide: Patient denies Family history of substance abuse: Per patient's sister, alcohol abuse runs in the family  Family History:  Family History  Problem Relation Age of Onset   Colon polyps Mother    Hypertension Mother    Diabetes Father    Hypertension Sister    Lupus Sister    Heart disease Maternal Grandfather    Lupus Other    BRCA 1/2 Neg Hx    Breast cancer Neg Hx    Colon cancer Neg Hx    Rectal cancer Neg Hx    Stomach cancer Neg Hx    Esophageal cancer Neg Hx     Social History:  Social History   Socioeconomic History   Marital status: Single    Spouse name: Not on file   Number of children: 5   Years of education: 12   Highest education level: Not on file  Occupational History   Occupation: Disabled  Tobacco Use   Smoking status: Former    Current packs/day: 0.00    Average packs/day: 0.3 packs/day for 4.0 years (1.0 ttl pk-yrs)    Types: Cigarettes    Start date: 05/01/2012    Quit date: 05/01/2016    Years since quitting: 7.9   Smokeless tobacco: Never  Vaping Use   Vaping status: Never Used  Substance and Sexual Activity   Alcohol use: Not Currently     Alcohol/week: 0.0 standard drinks of alcohol    Comment: Quit 2017   Drug use: No   Sexual activity: Not Currently    Birth control/protection: Condom  Other Topics Concern   Not on file  Social History Narrative   Lives alone   Caffeine use: Soda- daily   Right-handed   Social Drivers of Health   Financial Resource Strain: Low Risk  (12/25/2023)   Overall Financial Resource Strain (CARDIA)    Difficulty of Paying Living Expenses: Not hard at all  Food Insecurity: No Food Insecurity (12/25/2023)   Hunger Vital Sign    Worried About Running Out of Food in the Last Year: Never true    Ran Out of Food in the Last Year: Never true  Transportation Needs: No Transportation Needs (12/25/2023)   PRAPARE - Administrator, Civil Service (Medical): No    Lack of Transportation (Non-Medical): No  Physical Activity: Insufficiently Active (12/25/2023)   Exercise Vital Sign    Days of Exercise per Week: 1 day  Minutes of Exercise per Session: 20 min  Stress: No Stress Concern Present (12/25/2023)   [employer] of Occupational Health - Occupational Stress Questionnaire    Feeling of Stress : Not at all  Recent Concern: Stress - Stress Concern Present (11/09/2023)   [employer] of Occupational Health - Occupational Stress Questionnaire    Feeling of Stress : To some extent  Social Connections: Moderately Isolated (12/25/2023)   Social Connection and Isolation Panel    Frequency of Communication with Friends and Family: Once a week    Frequency of Social Gatherings with Friends and Family: Once a week    Attends Religious Services: 1 to 4 times per year    Active Member of [employer] or Organizations: Yes    Attends Banker Meetings: 1 to 4 times per year    Marital Status: Divorced    Allergies:  Allergies  Allergen Reactions   Penicillins Itching and Rash    Metabolic Disorder Labs: Lab Results  Component Value Date   HGBA1C 6.3 11/09/2023   MPG 114.02  04/07/2022   MPG 117 09/28/2015   Lab Results  Component Value Date   PROLACTIN 9.7 12/01/2022   PROLACTIN 17.2 04/07/2022   Lab Results  Component Value Date   CHOL 152 11/09/2023   TRIG 120 11/09/2023   HDL 41 11/09/2023   CHOLHDL 3.7 11/09/2023   VLDL 56 (H) 04/09/2022   LDLCALC 89 11/09/2023   LDLCALC 72 05/04/2023   Lab Results  Component Value Date   TSH 5.855 (H) 04/07/2022   TSH 1.560 06/28/2016    Therapeutic Level Labs: No results found for: LITHIUM Lab Results  Component Value Date   VALPROATE 118 (H) 04/26/2016   VALPROATE 136 (H) 04/26/2016   No results found for: CBMZ  Current Medications: Current Outpatient Medications  Medication Sig Dispense Refill   atorvastatin  (LIPITOR) 10 MG tablet Take 1 tablet (10 mg total) by mouth daily. Please schedule office visit for additional refills. 28 tablet 3   cloNIDine  (CATAPRES ) 0.1 MG tablet Take 1 tablet (0.1 mg total) by mouth at bedtime. 90 tablet 0   Deutetrabenazine  ER (AUSTEDO  XR) 24 MG TB24 Take 24 mg by mouth daily. 30 tablet 2   donepezil  (ARICEPT ) 5 MG tablet Take 1 tablet (5 mg total) by mouth at bedtime. 30 tablet 2   ferrous sulfate  (FEROSUL) 325 (65 FE) MG tablet Take 1 tablet (325 mg total) by mouth daily with breakfast. 28 tablet 3   FLUoxetine  (PROZAC ) 40 MG capsule Take 1 capsule (40 mg total) by mouth daily. 90 capsule 2   gabapentin  (NEURONTIN ) 100 MG capsule TAKE 1 CAPSULE BY MOUTH TWICE A DAY  ( IN THE MORNING & IN THE EVENING ) 56 capsule 1   hydrochlorothiazide  (HYDRODIURIL ) 25 MG tablet TAKE 1 TABLET BY MOUTH DAILY 28 tablet 3   mirtazapine  (REMERON ) 15 MG tablet Take 1 tablet (15 mg total) by mouth at bedtime. 30 tablet 2   QUEtiapine  (SEROQUEL ) 400 MG tablet Take 1 tablet (400 mg total) by mouth at bedtime. 30 tablet 2   Suvorexant  (BELSOMRA ) 10 MG TABS Take 1 tablet (10 mg total) by mouth at bedtime as needed. 30 tablet 0   tirzepatide  (ZEPBOUND ) 2.5 MG/0.5ML injection vial Inject 2.5  mg into the skin once a week. 2 mL 0   trimethoprim  (TRIMPEX ) 100 MG tablet Take 100 mg by mouth daily.     Current Facility-Administered Medications  Medication Dose Route Frequency Provider Last Rate  Last Admin   0.9 %  sodium chloride  infusion  500 mL Intravenous Once Pyrtle, Gordy HERO, MD         Musculoskeletal: Strength & Muscle Tone: within normal limits Gait & Station: normal Patient leans: N/A  Psychiatric Specialty Exam: Review of Systems  Psychiatric/Behavioral:  Positive for dysphoric mood and sleep disturbance. Negative for decreased concentration, hallucinations, self-injury and suicidal ideas. The patient is nervous/anxious. The patient is not hyperactive.     Blood pressure 123/81, pulse 83, height 5' 4 (1.626 m), weight 189 lb 6.4 oz (85.9 kg), SpO2 100%.Body mass index is 32.51 kg/m.  General Appearance: Casual  Eye Contact:  Good  Speech:  Clear and Coherent and Normal Rate  Volume:  Normal  Mood:  Anxious and Depressed  Affect:  Appropriate  Thought Process:  Coherent, Goal Directed, and Descriptions of Associations: Intact  Orientation:  Full (Time, Place, and Person)  Thought Content: WDL and Paranoid Ideation   Suicidal Thoughts:  No  Homicidal Thoughts:  No  Memory:  Immediate;   Good Recent;   Fair Remote;   Fair  Judgement:  Good  Insight:  Good  Psychomotor Activity:  TD  Concentration:  Concentration: Good and Attention Span: Good  Recall:  Good  Fund of Knowledge: Fair  Language: Good  Akathisia:  No  Handed:  Right  AIMS (if indicated): done  Assets:  Communication Skills Desire for Improvement Housing Social Support  ADL's:  Intact  Cognition: Impaired,  Mild  Sleep:  Fair   Screenings: AIMS    Flowsheet Row Clinical Support from 04/22/2024 in [facility] Clinical Support from 03/11/2024 in [facility] Office Visit from 12/08/2022 in [facility]  Clinical Support from 11/21/2022 in [facility] Clinical Support from 11/07/2022 in [facility]  AIMS Total Score 3 9 8 28 24    AUDIT    Flowsheet Row [facility]) [facility] ([facility]  Alcohol Use Disorder Identification Test Final Score (AUDIT) 9 4 20    GAD-7    Flowsheet Row Clinical Support from 04/22/2024 in [facility] Office Visit from 03/14/2024 in [facility] [place] [facility] Of [place]. [facility] Clinical Support from 03/11/2024 in [facility] Clinical Support from 01/08/2024 in [facility] Office Visit from 11/09/2023 in [facility] [place] - [facility] Dept Of [place]  Total GAD-7 Score 15 16 16 17 14    Mini-Mental    Flowsheet Row Office Visit from 01/31/2017 in [facility] Office Visit from 11/27/2016 in [facility] Office Visit from 06/28/2016 in [facility] Office Visit from 05/25/2016 in [facility] [place] [facility] [place]. [facility] Office Visit from 03/11/2015 in [facility]  Total Score (max 30 points ) 25 11 18 15 26    PHQ2-9    Flowsheet Row Clinical Support from 04/22/2024 in [facility] Office Visit from 03/14/2024 in [facility] [place] - A Dept Of [place]. [facility] Clinical Support from 03/11/2024 in [facility] Clinical Support from 01/08/2024 in [facility] Clinical Support from 12/25/2023 in [facility] [place] -  A Dept Of [place]  PHQ-2 Total Score 4 5 5 6 2   PHQ-9 Total Score 13 12 12 23 8    Flowsheet Row Clinical Support from 04/22/2024 in [facility] Clinical Support from 03/11/2024 in [facility] Clinical Support from 01/08/2024 in [facility]  C-SSRS RISK CATEGORY Moderate Risk Moderate Risk Moderate Risk     Assessment and Plan:   Jody [name] is a 53 year old, African-American female with a past psychiatric history significant for schizoaffective disorder (depressed type), major depressive disorder, generalized anxiety disorder, sleep disturbances, and major neurocognitive disorder who presents to [facility] for follow up and medication management.  Patient presents to the encounter stating that she continues to take her medications regularly and denies experiencing any adverse side effects associated with her use of her medications.  An aims assessment was performed with the patient scoring of 3.  Patient continues to take her Austedo  regularly for the management of her tardive dyskinesia.  Patient reports that she still continues to experience some depression on occasion.  She also endorses anxiety but states that her symptoms have been manageable lately.  A PHQ-9 screen was performed with the patient scoring at 13.  A GAD-7 screen was also performed with the patient scoring a 15.  Patient also notes that she has been experiencing paranoia on occasion attributed to the belief that specific colored cars (blue and gray) are following her when she goes over to her sister's house.  Despite this paranoia, patient denies this being a major issue at this time.  Patient endorses stability on her current medication regimen and would like to continue taking her medications as prescribed.  Patient's medications to be e-prescribed to pharmacy of choice.  Due to  her use of Seroquel , provider informed patient that an up-to-date EKG would need to be obtained.  Patient to be referred to cardiology for EKG.  Patient vocalized understanding.  Patient's labs are up to date.  A [place] Suicide Severity Rating Scale was performed with the patient being considered moderate risk.  Patient denies suicidal ideations and is able to contract for safety following the conclusion of the encounter.  Safety planning was discussed with the patient prior to the conclusion of the encounter.  - Patient was advised to contact 911 in the event of a mental health crisis - Patient was advised to contact [address] [facility] in the event of a mental health crisis - Patient was instructed to present to [facility] in the event of a mental health crisis  Collaboration of Care: Collaboration of Care: Medication Management AEB provider managing patient's psychiatric medications, Primary Care Provider AEB patient being seen by a primary care provider at [facility], Psychiatrist AEB patient being seen by mental health provider at this facility, and Referral or follow-up with counselor/therapist AEB patient being seen by a licensed clinical social worker at this facility  Patient/Guardian was advised Release of Information must be obtained prior to any record release in order to collaborate their care with an outside provider. Patient/Guardian was advised if they have not already done so to contact the registration department to sign all necessary forms in order for us  to release information regarding their care.   Consent: Patient/Guardian gives verbal consent for treatment and assignment  of benefits for services provided during this visit. Patient/Guardian expressed understanding and agreed to proceed.   1. Schizoaffective disorder, depressive type (HCC)  - QUEtiapine  (SEROQUEL ) 400 MG tablet; Take 1 tablet (400 mg total)  by mouth at bedtime.  Dispense: 30 tablet; Refill: 2 - FLUoxetine  (PROZAC ) 40 MG capsule; Take 1 capsule (40 mg total) by mouth daily.  Dispense: 90 capsule; Refill: 2 - mirtazapine  (REMERON ) 15 MG tablet; Take 1 tablet (15 mg total) by mouth at bedtime.  Dispense: 30 tablet; Refill: 2 - Deutetrabenazine  ER (AUSTEDO  XR) 24 MG TB24; Take 24 mg by mouth daily.  Dispense: 30 tablet; Refill: 2  2. Major neurocognitive disorder (HCC)  - FLUoxetine  (PROZAC ) 40 MG capsule; Take 1 capsule (40 mg total) by mouth daily.  Dispense: 90 capsule; Refill: 2 - donepezil  (ARICEPT ) 5 MG tablet; Take 1 tablet (5 mg total) by mouth at bedtime.  Dispense: 30 tablet; Refill: 2  3. Sleep disturbances  - Suvorexant  (BELSOMRA ) 10 MG TABS; Take 1 tablet (10 mg total) by mouth at bedtime as needed.  Dispense: 30 tablet; Refill: 0  4. Tardive dyskinesia  - Deutetrabenazine  ER (AUSTEDO  XR) 24 MG TB24; Take 24 mg by mouth daily.  Dispense: 30 tablet; Refill: 2  5. GAD (generalized anxiety disorder) (Primary)  - FLUoxetine  (PROZAC ) 40 MG capsule; Take 1 capsule (40 mg total) by mouth daily.  Dispense: 90 capsule; Refill: 2 - mirtazapine  (REMERON ) 15 MG tablet; Take 1 tablet (15 mg total) by mouth at bedtime.  Dispense: 30 tablet; Refill: 2  6. Long term current use of antipsychotic medication Referral to Cardiology for EKG  - Ambulatory referral to Cardiology  Patient to follow-up in 6 weeks Provider spent a total of 27 minutes with the patient/reviewing patient's chart  Jody FORBES Bolster, PA 04/22/2024, 3:30 PM

## 2024-05-20 ENCOUNTER — Other Ambulatory Visit (HOSPITAL_COMMUNITY): Payer: Self-pay | Admitting: Physician Assistant

## 2024-05-20 ENCOUNTER — Other Ambulatory Visit: Payer: Self-pay | Admitting: Nurse Practitioner

## 2024-05-20 DIAGNOSIS — R232 Flushing: Secondary | ICD-10-CM

## 2024-05-20 DIAGNOSIS — G479 Sleep disorder, unspecified: Secondary | ICD-10-CM

## 2024-06-05 ENCOUNTER — Ambulatory Visit (HOSPITAL_COMMUNITY): Admitting: Physician Assistant

## 2024-06-05 ENCOUNTER — Encounter (HOSPITAL_COMMUNITY): Payer: Self-pay | Admitting: Physician Assistant

## 2024-06-05 VITALS — BP 129/90 | HR 86 | Ht 64.0 in | Wt 195.4 lb

## 2024-06-05 DIAGNOSIS — Z79899 Other long term (current) drug therapy: Secondary | ICD-10-CM

## 2024-06-05 DIAGNOSIS — G2401 Drug induced subacute dyskinesia: Secondary | ICD-10-CM | POA: Diagnosis not present

## 2024-06-05 DIAGNOSIS — G479 Sleep disorder, unspecified: Secondary | ICD-10-CM

## 2024-06-05 DIAGNOSIS — F251 Schizoaffective disorder, depressive type: Secondary | ICD-10-CM

## 2024-06-05 DIAGNOSIS — F411 Generalized anxiety disorder: Secondary | ICD-10-CM

## 2024-06-05 DIAGNOSIS — F0390 Unspecified dementia without behavioral disturbance: Secondary | ICD-10-CM

## 2024-06-05 MED ORDER — BELSOMRA 10 MG PO TABS: 10.0000 mg | ORAL_TABLET | Freq: Every evening | ORAL | 0 refills | Status: DC | PRN

## 2024-06-05 MED ORDER — FLUOXETINE HCL 40 MG PO CAPS
40.0000 mg | ORAL_CAPSULE | Freq: Every day | ORAL | 2 refills | Status: DC
Start: 2024-06-05 — End: 2024-07-10

## 2024-06-05 MED ORDER — AUSTEDO XR 30 MG PO TB24: 30.0000 mg | ORAL_TABLET | Freq: Every day | ORAL | 2 refills | Status: DC

## 2024-06-05 MED ORDER — MIRTAZAPINE 15 MG PO TABS: 15.0000 mg | ORAL_TABLET | Freq: Every day | ORAL | 2 refills | Status: DC

## 2024-06-05 MED ORDER — DONEPEZIL HCL 5 MG PO TABS: 5.0000 mg | ORAL_TABLET | Freq: Every day | ORAL | 2 refills | Status: DC

## 2024-06-05 MED ORDER — QUETIAPINE FUMARATE 400 MG PO TABS
400.0000 mg | ORAL_TABLET | Freq: Every day | ORAL | 2 refills | Status: DC
Start: 2024-06-05 — End: 2024-07-10

## 2024-06-05 NOTE — Progress Notes (Signed)
 [facility] MD/PA/NP OP Progress Note  06/05/2024 3:00 PM Jody [name]  MRN:  [id number]  Chief Complaint:  Chief Complaint  Patient presents with   Follow-up   Medication Management   HPI:   Jody [name] is a 53 year old, African-American female with a past psychiatric history significant for schizoaffective disorder (depressed type), major depressive disorder, generalized anxiety disorder, sleep disturbances, and major neurocognitive disorder who presents to [facility] for follow up and medication management. Patient is currently being managed on the following psychiatric medications:  Seroquel  400 mg at bedtime Prozac  40 mg daily Belsomra  10 mg at bedtime Austedo  XR 24 mg daily Donepezil  5 mg daily Mirtazapine  15 mg at bedtime  Patient presents to the encounter stating that she continues to take her medications regularly and denies experiencing any adverse side effects.  She reports that when she uses Belsomra , she receives on average 5 to 6 hours of sleep per night.  Patient endorses good mood and states that her depression has been minimal.  She reports that her anxiety has also been minimal and denies any new stressors at this time.  A PHQ-9 screen was performed with the patient scoring a 9.  A GAD-7 screen was also performed with the patient scoring a 6.  Patient is alert and oriented x 4, calm, cooperative, and fully engaged in conversation during the encounter.  Patient endorses good mood.  Patient exhibits euthymic mood with appropriate affect.  Patient denies suicidal or homicidal ideations.  She further denies auditory or visual hallucinations and does not appear to be responding to internal/external stimuli.  Patient endorses fair sleep and receives on average 5 to 6 hours of sleep per night.  Patient endorses good appetite and eats on average 3-4 meals per day.  Patient denies alcohol consumption, tobacco use, or illicit drug  use.  Visit Diagnosis:    ICD-10-CM   1. Schizoaffective disorder, depressive type (HCC)  F25.1 Deutetrabenazine  ER (AUSTEDO  XR) 30 MG TB24    mirtazapine  (REMERON ) 15 MG tablet    FLUoxetine  (PROZAC ) 40 MG capsule    QUEtiapine  (SEROQUEL ) 400 MG tablet    2. Tardive dyskinesia  G24.01 Deutetrabenazine  ER (AUSTEDO  XR) 30 MG TB24    3. GAD (generalized anxiety disorder)  F41.1 mirtazapine  (REMERON ) 15 MG tablet    FLUoxetine  (PROZAC ) 40 MG capsule    4. Major neurocognitive disorder (HCC)  F03.90 FLUoxetine  (PROZAC ) 40 MG capsule    donepezil  (ARICEPT ) 5 MG tablet    5. Sleep disturbances  G47.9 Suvorexant  (BELSOMRA ) 10 MG TABS      Past Psychiatric History:  Schizophrenia - diagnosed roughly 7 years ago when patient was attending [facility] [facility] Patient was diagnosed with schizoaffective disorder (depressive type) when admitted to [facility] from 04/08/2022 - 04/12/2022.  In addition to being diagnosed with schizoaffective disorder, patient was treated for insomnia, generalized anxiety disorder, and tardive dyskinesia.   **Onset of dementia, major neurocognitive disorder, due to Alzheimer's disease, without behavioral disturbance, mild  Past Medical History:  Past Medical History:  Diagnosis Date   Anxiety and depression    Cystitis, interstitial    Elevated troponin    a. 09/2015: CP/elevated troponin up to 0.11 - unclear significance. CT neg for PE, LHC neg for CAD, normal echo.   Hypertension    Microcytic anemia    Noted on labs   Panic attack    Paranoid schizophrenia (HCC)    Tobacco abuse  Past Surgical History:  Procedure Laterality Date   CARDIAC CATHETERIZATION N/A 09/29/2015   Procedure: Left Heart Cath and Coronary Angiography;  Surgeon: Candyce GORMAN Reek, MD;  Location: [place] INVASIVE CV LAB;  Service: Cardiovascular;  Laterality: N/A;   TUBAL LIGATION      Family Psychiatric History:  Grandmother (maternal) - unsure of the  diagnosis but states that her grandmother had mental health issues Uncle (maternal) - Schizophrenia and bipolar disorder, he is currently taking medications but is unsure of the type.   Family history of suicide: Patient denies Family history of homicide: Patient denies Family history of substance abuse: Per patient's sister, alcohol abuse runs in the family  Family History:  Family History  Problem Relation Age of Onset   Colon polyps Mother    Hypertension Mother    Diabetes Father    Hypertension Sister    Lupus Sister    Heart disease Maternal Grandfather    Lupus Other    BRCA 1/2 Neg Hx    Breast cancer Neg Hx    Colon cancer Neg Hx    Rectal cancer Neg Hx    Stomach cancer Neg Hx    Esophageal cancer Neg Hx     Social History:  Social History   Socioeconomic History   Marital status: Single    Spouse name: Not on file   Number of children: 5   Years of education: 12   Highest education level: Not on file  Occupational History   Occupation: Disabled  Tobacco Use   Smoking status: Former    Current packs/day: 0.00    Average packs/day: 0.3 packs/day for 4.0 years (1.0 ttl pk-yrs)    Types: Cigarettes    Start date: 05/01/2012    Quit date: 05/01/2016    Years since quitting: 8.1   Smokeless tobacco: Never  Vaping Use   Vaping status: Never Used  Substance and Sexual Activity   Alcohol use: Not Currently    Alcohol/week: 0.0 standard drinks of alcohol    Comment: Quit 2017   Drug use: No   Sexual activity: Not Currently    Birth control/protection: Condom  Other Topics Concern   Not on file  Social History Narrative   Lives alone   Caffeine use: Soda- daily   Right-handed   Social Drivers of Health   Financial Resource Strain: Low Risk  (12/25/2023)   Overall Financial Resource Strain (CARDIA)    Difficulty of Paying Living Expenses: Not hard at all  Food Insecurity: No Food Insecurity (12/25/2023)   Hunger Vital Sign    Worried About Running Out of  Food in the Last Year: Never true    Ran Out of Food in the Last Year: Never true  Transportation Needs: No Transportation Needs (12/25/2023)   PRAPARE - Administrator, Civil Service (Medical): No    Lack of Transportation (Non-Medical): No  Physical Activity: Insufficiently Active (12/25/2023)   Exercise Vital Sign    Days of Exercise per Week: 1 day    Minutes of Exercise per Session: 20 min  Stress: No Stress Concern Present (12/25/2023)   [employer] of Occupational Health - Occupational Stress Questionnaire    Feeling of Stress : Not at all  Recent Concern: Stress - Stress Concern Present (11/09/2023)   [employer] of Occupational Health - Occupational Stress Questionnaire    Feeling of Stress : To some extent  Social Connections: Moderately Isolated (12/25/2023)   Social Connection and Isolation Panel  Frequency of Communication with Friends and Family: Once a week    Frequency of Social Gatherings with Friends and Family: Once a week    Attends Religious Services: 1 to 4 times per year    Active Member of [employer] or Organizations: Yes    Attends Banker Meetings: 1 to 4 times per year    Marital Status: Divorced    Allergies:  Allergies  Allergen Reactions   Penicillins Itching and Rash    Metabolic Disorder Labs: Lab Results  Component Value Date   HGBA1C 6.3 11/09/2023   MPG 114.02 04/07/2022   MPG 117 09/28/2015   Lab Results  Component Value Date   PROLACTIN 9.7 12/01/2022   PROLACTIN 17.2 04/07/2022   Lab Results  Component Value Date   CHOL 152 11/09/2023   TRIG 120 11/09/2023   HDL 41 11/09/2023   CHOLHDL 3.7 11/09/2023   VLDL 56 (H) 04/09/2022   LDLCALC 89 11/09/2023   LDLCALC 72 05/04/2023   Lab Results  Component Value Date   TSH 5.855 (H) 04/07/2022   TSH 1.560 06/28/2016    Therapeutic Level Labs: No results found for: LITHIUM Lab Results  Component Value Date   VALPROATE 118 (H) 04/26/2016    VALPROATE 136 (H) 04/26/2016   No results found for: CBMZ  Current Medications: Current Outpatient Medications  Medication Sig Dispense Refill   Deutetrabenazine  ER (AUSTEDO  XR) 30 MG TB24 Take 30 mg by mouth daily. 30 tablet 2   atorvastatin  (LIPITOR) 10 MG tablet Take 1 tablet (10 mg total) by mouth daily. Please schedule office visit for additional refills. 28 tablet 3   cloNIDine  (CATAPRES ) 0.1 MG tablet Take 1 tablet (0.1 mg total) by mouth at bedtime. 90 tablet 0   donepezil  (ARICEPT ) 5 MG tablet Take 1 tablet (5 mg total) by mouth at bedtime. 30 tablet 2   ferrous sulfate  (FEROSUL) 325 (65 FE) MG tablet Take 1 tablet (325 mg total) by mouth daily with breakfast. 28 tablet 3   FLUoxetine  (PROZAC ) 40 MG capsule Take 1 capsule (40 mg total) by mouth daily. 90 capsule 2   gabapentin  (NEURONTIN ) 100 MG capsule TAKE 1 CAPSULE BY MOUTH TWICE A DAY (MORNING AND EVENING) 56 capsule 1   hydrochlorothiazide  (HYDRODIURIL ) 25 MG tablet TAKE 1 TABLET BY MOUTH DAILY 28 tablet 3   mirtazapine  (REMERON ) 15 MG tablet Take 1 tablet (15 mg total) by mouth at bedtime. 30 tablet 2   QUEtiapine  (SEROQUEL ) 400 MG tablet Take 1 tablet (400 mg total) by mouth at bedtime. 30 tablet 2   Suvorexant  (BELSOMRA ) 10 MG TABS Take 1 tablet (10 mg total) by mouth at bedtime as needed. 30 tablet 0   tirzepatide  (ZEPBOUND ) 2.5 MG/0.5ML injection vial Inject 2.5 mg into the skin once a week. 2 mL 0   trimethoprim  (TRIMPEX ) 100 MG tablet Take 100 mg by mouth daily.     Current Facility-Administered Medications  Medication Dose Route Frequency Provider Last Rate Last Admin   0.9 %  sodium chloride  infusion  500 mL Intravenous Once Pyrtle, Gordy HERO, MD         Musculoskeletal: Strength & Muscle Tone: within normal limits Gait & Station: normal Patient leans: N/A  Psychiatric Specialty Exam: Review of Systems  Psychiatric/Behavioral:  Positive for sleep disturbance. Negative for decreased concentration, dysphoric mood,  hallucinations, self-injury and suicidal ideas. The patient is nervous/anxious. The patient is not hyperactive.     Blood pressure (!) 129/90, pulse 86, height 5'  4 (1.626 m), weight 195 lb 6.4 oz (88.6 kg), SpO2 100%.Body mass index is 33.54 kg/m.  General Appearance: Casual  Eye Contact:  Good  Speech:  Clear and Coherent and Normal Rate  Volume:  Normal  Mood:  Anxious and Depressed  Affect:  Appropriate  Thought Process:  Coherent, Goal Directed, and Descriptions of Associations: Intact  Orientation:  Full (Time, Place, and Person)  Thought Content: WDL and Paranoid Ideation   Suicidal Thoughts:  No  Homicidal Thoughts:  No  Memory:  Immediate;   Good Recent;   Fair Remote;   Fair  Judgement:  Good  Insight:  Good  Psychomotor Activity:  TD  Concentration:  Concentration: Good and Attention Span: Good  Recall:  Good  Fund of Knowledge: Fair  Language: Good  Akathisia:  No  Handed:  Right  AIMS (if indicated): done  Assets:  Communication Skills Desire for Improvement Housing Social Support  ADL's:  Intact  Cognition: Impaired,  Mild  Sleep:  Fair   Screenings: AIMS    Flowsheet Row Clinical Support from 06/05/2024 in [facility] Clinical Support from 04/22/2024 in [facility] Clinical Support from 03/11/2024 in [facility] Office Visit from 12/08/2022 in [facility] Clinical Support from 11/21/2022 in [facility]  AIMS Total Score 7 3 9 8 28    AUDIT    Flowsheet Row [facility]) [facility] (Dis[facility]  Alcohol Use Disorder Identification Test Final Score (AUDIT) 9 4 20    GAD-7    Flowsheet Row Clinical  Support from 06/05/2024 in [facility] Clinical Support from 04/22/2024 in [facility] Office Visit from 03/14/2024 in [facility] [place] [facility] [place] [facility] Clinical Support from 03/11/2024 in [facility] Clinical Support from 01/08/2024 in [facility]  Total GAD-7 Score 6 15 16 16 17    Mini-Mental    Flowsheet Row Office Visit from 01/31/2017 in [facility] Office Visit from 11/27/2016 in [facility] Office Visit from 06/28/2016 in [facility] Office Visit from 05/25/2016 in [facility] [place] [facility] [place] Office Visit from 03/11/2015 in [facility] Neurology  Total Score (max 30 points ) 25 11 18 15 26    PHQ2-9    Flowsheet Row Clinical Support from 06/05/2024 in [facility] Clinical Support from 04/22/2024 in [facility] Office Visit from 03/14/2024 in [facility] [place] [facility] Of . [facility] Clinical Support from 03/11/2024 in [facility] Clinical Support from 01/08/2024 in [facility]  PHQ-2 Total Score 2 4 5 5 6   PHQ-9 Total Score 9 13 12 12 23    Flowsheet Row Clinical Support from 06/05/2024 in [facility] Clinical Support from 04/22/2024 in [facility] Clinical Support from 03/11/2024 in [facility]  C-SSRS RISK CATEGORY Moderate Risk Moderate Risk Moderate Risk     Assessment and Plan:   Jody [name] is a 53 year old, African-American female with a past psychiatric history  significant for schizoaffective disorder (depressed type), major depressive disorder, generalized anxiety  disorder, sleep disturbances, and major neurocognitive disorder who presents to [facility] for follow up and medication management.  Patient presents to the encounter stating that she continues to take her medications regularly and denies experiencing any adverse side effects.  An aims assessment was performed with the patient scoring a 7.  Patient is currently on Austedo  for the management of her involuntary movements.  Patient reports that her use of Belsomra  has been effective in managing her sleep.  Patient reports that she receives on average 5 to 6 hours of sleep per night.  Patient endorses minimal depression and anxiety.  A PHQ-9 screen was performed with the patient scoring a 9.  A GAD-7 screen was also performed with the patient scoring a 6.  Patient endorses stability on her current medication regimen and would like to continue taking her medications as prescribed.  Patient's medications to be e-prescribed to pharmacy of choice.  A [place] Suicide Severity Rating Scale was performed with the patient being considered moderate risk.  Patient denies suicidal ideations and is able to contract for safety at this time.  Safety planning was discussed with the patient prior to the conclusion of the encounter.  - Patient was instructed to contact 911 in the event of a mental health crisis. - Patient was instructed to contact 988 Suicide and Crisis Lifeline in the event of a mental health crisis. - Patient was instructed to present to [facility] in the event of a mental health crisis.  Collaboration of Care: Collaboration of Care: Medication Management AEB provider managing patient's psychiatric medications, Primary Care Provider AEB patient being seen by a primary care provider at [facility], Psychiatrist AEB patient being seen by mental health provider at this facility, and Referral or follow-up with  counselor/therapist AEB patient being seen by a licensed clinical social worker at this facility  Patient/Guardian was advised Release of Information must be obtained prior to any record release in order to collaborate their care with an outside provider. Patient/Guardian was advised if they have not already done so to contact the registration department to sign all necessary forms in order for us  to release information regarding their care.   Consent: Patient/Guardian gives verbal consent for treatment and assignment of benefits for services provided during this visit. Patient/Guardian expressed understanding and agreed to proceed.   1. Schizoaffective disorder, depressive type (HCC)  - Deutetrabenazine  ER (AUSTEDO  XR) 30 MG TB24; Take 30 mg by mouth daily.  Dispense: 30 tablet; Refill: 2 - mirtazapine  (REMERON ) 15 MG tablet; Take 1 tablet (15 mg total) by mouth at bedtime.  Dispense: 30 tablet; Refill: 2 - FLUoxetine  (PROZAC ) 40 MG capsule; Take 1 capsule (40 mg total) by mouth daily.  Dispense: 90 capsule; Refill: 2 - QUEtiapine  (SEROQUEL ) 400 MG tablet; Take 1 tablet (400 mg total) by mouth at bedtime.  Dispense: 30 tablet; Refill: 2  2. Tardive dyskinesia  - Deutetrabenazine  ER (AUSTEDO  XR) 30 MG TB24; Take 30 mg by mouth daily.  Dispense: 30 tablet; Refill: 2  3. GAD (generalized anxiety disorder)  - mirtazapine  (REMERON ) 15 MG tablet; Take 1 tablet (15 mg total) by mouth at bedtime.  Dispense: 30 tablet; Refill: 2 - FLUoxetine  (PROZAC ) 40 MG capsule; Take 1 capsule (40 mg total) by mouth daily.  Dispense: 90 capsule; Refill: 2  4. Major neurocognitive disorder (HCC)  - FLUoxetine  (PROZAC )  40 MG capsule; Take 1 capsule (40 mg total) by mouth daily.  Dispense: 90 capsule; Refill: 2 - donepezil  (ARICEPT ) 5 MG tablet; Take 1 tablet (5 mg total) by mouth at bedtime.  Dispense: 30 tablet; Refill: 2  5. Sleep disturbances  - Suvorexant  (BELSOMRA ) 10 MG TABS; Take 1 tablet (10 mg total)  by mouth at bedtime as needed.  Dispense: 30 tablet; Refill: 0  6. Long term current use of antipsychotic medication (Primary) Referral to cardiology for EKG  Patient to follow-up in 5 weeks Provider spent a total of 18 minutes with the patient/reviewing patient's chart  Reginia FORBES Bolster, PA 06/05/2024, 3:00 PM

## 2024-06-09 ENCOUNTER — Other Ambulatory Visit: Payer: Self-pay

## 2024-06-16 ENCOUNTER — Other Ambulatory Visit: Payer: Self-pay

## 2024-06-16 ENCOUNTER — Encounter: Payer: Self-pay | Admitting: Nurse Practitioner

## 2024-06-16 ENCOUNTER — Other Ambulatory Visit (HOSPITAL_COMMUNITY)
Admission: RE | Admit: 2024-06-16 | Discharge: 2024-06-16 | Disposition: A | Discharge status: Home / Self Care | Source: Ambulatory Visit | Attending: Nurse Practitioner | Admitting: Nurse Practitioner

## 2024-06-16 ENCOUNTER — Ambulatory Visit (HOSPITAL_BASED_OUTPATIENT_CLINIC_OR_DEPARTMENT_OTHER): Admitting: Nurse Practitioner

## 2024-06-16 VITALS — BP 116/80 | HR 64 | Resp 19 | Ht 64.0 in | Wt 198.4 lb

## 2024-06-16 DIAGNOSIS — Z713 Dietary counseling and surveillance: Secondary | ICD-10-CM | POA: Diagnosis not present

## 2024-06-16 DIAGNOSIS — Z114 Encounter for screening for human immunodeficiency virus [HIV]: Secondary | ICD-10-CM | POA: Insufficient documentation

## 2024-06-16 DIAGNOSIS — R7989 Other specified abnormal findings of blood chemistry: Secondary | ICD-10-CM | POA: Diagnosis not present

## 2024-06-16 DIAGNOSIS — B370 Candidal stomatitis: Secondary | ICD-10-CM | POA: Insufficient documentation

## 2024-06-16 DIAGNOSIS — R946 Abnormal results of thyroid function studies: Secondary | ICD-10-CM | POA: Insufficient documentation

## 2024-06-16 DIAGNOSIS — Z79899 Other long term (current) drug therapy: Secondary | ICD-10-CM | POA: Diagnosis not present

## 2024-06-16 DIAGNOSIS — Z2821 Immunization not carried out because of patient refusal: Secondary | ICD-10-CM | POA: Insufficient documentation

## 2024-06-16 DIAGNOSIS — Z7251 High risk heterosexual behavior: Secondary | ICD-10-CM | POA: Insufficient documentation

## 2024-06-16 DIAGNOSIS — R7303 Prediabetes: Secondary | ICD-10-CM | POA: Insufficient documentation

## 2024-06-16 DIAGNOSIS — Z113 Encounter for screening for infections with a predominantly sexual mode of transmission: Secondary | ICD-10-CM | POA: Diagnosis present

## 2024-06-16 DIAGNOSIS — I10 Essential (primary) hypertension: Secondary | ICD-10-CM | POA: Insufficient documentation

## 2024-06-16 MED ORDER — NYSTATIN 100000 UNIT/ML MT SUSP
5.0000 mL | Freq: Four times a day (QID) | OROMUCOSAL | 0 refills | Status: AC
  Filled 2024-06-16: qty 60, 3d supply, fill #0

## 2024-06-16 NOTE — Progress Notes (Signed)
 Assessment & Plan:  Jody [name] was seen today for mouth lesions.  Diagnoses and all orders for this visit:  High risk heterosexual behavior -     Cervicovaginal ancillary only -     HIV Antibody (routine testing w rflx) -     RPR -     HSV 1 and 2 Ab, IgG  Prediabetes -     Hemoglobin A1c  Primary hypertension -     CMP14+EGFR Screening for sexually transmitted infections White film on tongue not indicative of STD. No symptoms of oral herpes or gonorrhea in throat. - Order blood test for HSV 1 and 2. - Perform vaginal swab for STDs including gonorrhea, chlamydia, trichomonas. - Test for syphilis  Abnormal CBC -     CBC with Differential/Platelet  Elevated TSH -     Thyroid  Panel With TSH  Encounter for screening for human immunodeficiency virus (HIV) -     HIV Antibody (routine testing w rflx)  Thrush -     nystatin  (MYCOSTATIN ) 100000 UNIT/ML suspension; SWISH AND SPIT 5 mLs (500,000 Units total) by mouth 4 (four) times daily. Oral candidiasis (suspected) - Prescribe nystatin  oral suspension for swish and spit, twice daily.  Influenza vaccination Declined flu vaccine due to past flu-like symptoms. Informed about potential side effects and benefits of vaccination.  Patient has been counseled on age-appropriate routine health concerns for screening and prevention. These are reviewed and up-to-date. Referrals have been placed accordingly. Immunizations are up-to-date or declined.    Subjective:   Chief Complaint  Patient presents with   Mouth Lesions    First noticed 1 month ago.    History of Present Illness Jody [name] is a 53 year old female who presents with concerns about oral lesions and a white film on her tongue.  She experienced oral lesions a couple of days ago, which have since resolved. She now has a persistent white film on her tongue that developed after oral sex. Attempts to scrape the film off were unsuccessful. No associated itching or other  symptoms are present. In 2016, she was treated with Keflex  for a urinary tract infection characterized by painful urination. During the same year, she recalls being told she had herpes following a mouth swab, although she does not recall having painful sores at that time. She has engaged in both oral sex and sexual intercourse with a partner she initially dated in 2016 and has recently started dating again. Her partner has not experienced any symptoms and has not been tested for sexually transmitted infections.  No current oral lesions, itching of the tongue, vagina, sore throat, fever, or red throat. She denies any GU symptoms     Review of Systems  Constitutional:  Negative for fever, malaise/fatigue and weight loss.  HENT:  Negative for nosebleeds.   Eyes: Negative.  Negative for blurred vision, double vision and photophobia.  Respiratory: Negative.  Negative for cough and shortness of breath.   Cardiovascular: Negative.  Negative for chest pain, palpitations and leg swelling.  Gastrointestinal: Negative.  Negative for heartburn, nausea and vomiting.  Musculoskeletal: Negative.  Negative for myalgias.  Neurological: Negative.  Negative for dizziness, focal weakness, seizures and headaches.  Psychiatric/Behavioral: Negative.  Negative for suicidal ideas.     Past Medical History:  Diagnosis Date   Anxiety and depression    Cystitis, interstitial    Elevated troponin    a. 09/2015: CP/elevated troponin up to 0.11 - unclear significance. CT neg for PE, LHC neg  for CAD, normal echo.   Hypertension    Microcytic anemia    Noted on labs   Panic attack    Paranoid schizophrenia (HCC)    Tobacco abuse     Past Surgical History:  Procedure Laterality Date   CARDIAC CATHETERIZATION N/A 09/29/2015   Procedure: Left Heart Cath and Coronary Angiography;  Surgeon: Candyce GORMAN Reek, MD;  Location: [facility] INVASIVE CV LAB;  Service: Cardiovascular;  Laterality: N/A;   TUBAL LIGATION       Family History  Problem Relation Age of Onset   Colon polyps Mother    Hypertension Mother    Diabetes Father    Hypertension Sister    Lupus Sister    Heart disease Maternal Grandfather    Lupus Other    BRCA 1/2 Neg Hx    Breast cancer Neg Hx    Colon cancer Neg Hx    Rectal cancer Neg Hx    Stomach cancer Neg Hx    Esophageal cancer Neg Hx     Social History Reviewed with no changes to be made today.   Outpatient Medications Prior to Visit  Medication Sig Dispense Refill   atorvastatin  (LIPITOR) 10 MG tablet Take 1 tablet (10 mg total) by mouth daily. Please schedule office visit for additional refills. 28 tablet 3   cloNIDine  (CATAPRES ) 0.1 MG tablet Take 1 tablet (0.1 mg total) by mouth at bedtime. 90 tablet 0   Deutetrabenazine  ER (AUSTEDO  XR) 30 MG TB24 Take 30 mg by mouth daily. 30 tablet 2   donepezil  (ARICEPT ) 5 MG tablet Take 1 tablet (5 mg total) by mouth at bedtime. 30 tablet 2   ferrous sulfate  (FEROSUL) 325 (65 FE) MG tablet Take 1 tablet (325 mg total) by mouth daily with breakfast. 28 tablet 3   FLUoxetine  (PROZAC ) 40 MG capsule Take 1 capsule (40 mg total) by mouth daily. 90 capsule 2   gabapentin  (NEURONTIN ) 100 MG capsule TAKE 1 CAPSULE BY MOUTH TWICE A DAY (MORNING AND EVENING) 56 capsule 1   hydrochlorothiazide  (HYDRODIURIL ) 25 MG tablet TAKE 1 TABLET BY MOUTH DAILY 28 tablet 3   QUEtiapine  (SEROQUEL ) 400 MG tablet Take 1 tablet (400 mg total) by mouth at bedtime. 30 tablet 2   trimethoprim  (TRIMPEX ) 100 MG tablet Take 100 mg by mouth daily.     mirtazapine  (REMERON ) 15 MG tablet Take 1 tablet (15 mg total) by mouth at bedtime. 30 tablet 2   Suvorexant  (BELSOMRA ) 10 MG TABS Take 1 tablet (10 mg total) by mouth at bedtime as needed. (Patient not taking: Reported on 06/16/2024) 30 tablet 0   tirzepatide  (ZEPBOUND ) 2.5 MG/0.5ML injection vial Inject 2.5 mg into the skin once a week. (Patient not taking: Reported on 06/16/2024) 2 mL 0   Facility-Administered  Medications Prior to Visit  Medication Dose Route Frequency Provider Last Rate Last Admin   0.9 %  sodium chloride  infusion  500 mL Intravenous Once Pyrtle, Gordy HERO, MD        Allergies  Allergen Reactions   Penicillins Itching and Rash       Objective:    BP 116/80 (BP Location: Left Arm, Patient Position: Sitting, Cuff Size: Normal)   Pulse 64   Resp 19   Ht 5' 4 (1.626 m)   Wt 198 lb 6.4 oz (90 kg)   LMP  (LMP Unknown)   SpO2 99%   BMI 34.06 kg/m  Wt Readings from Last 3 Encounters:  06/16/24 198 lb 6.4 oz (90  kg)  03/14/24 189 lb (85.7 kg)  03/13/24 188 lb (85.3 kg)    Physical Exam Vitals and nursing note reviewed.  Constitutional:      Appearance: She is well-developed.  HENT:     Head: Normocephalic and atraumatic.     Mouth/Throat:     Lips: No lesions.     Mouth: Mucous membranes are dry. No oral lesions.     Tongue: No lesions. Tongue does not deviate from midline.     Pharynx: Oropharynx is clear. Uvula midline.     Tonsils: No tonsillar exudate or tonsillar abscesses.  Cardiovascular:     Rate and Rhythm: Normal rate and regular rhythm.     Heart sounds: Normal heart sounds. No murmur heard.    No friction rub. No gallop.  Pulmonary:     Effort: Pulmonary effort is normal. No tachypnea or respiratory distress.     Breath sounds: Normal breath sounds. No decreased breath sounds, wheezing, rhonchi or rales.  Chest:     Chest wall: No tenderness.  Musculoskeletal:        General: Normal range of motion.     Cervical back: Normal range of motion.  Skin:    General: Skin is warm and dry.  Neurological:     Mental Status: She is alert and oriented to person, place, and time.     Coordination: Coordination normal.  Psychiatric:        Behavior: Behavior normal. Behavior is cooperative.        Thought Content: Thought content normal.        Judgment: Judgment normal.          Patient has been counseled extensively about nutrition and exercise as  well as the importance of adherence with medications and regular follow-up. The patient was given clear instructions to go to ER or return to medical center if symptoms don't improve, worsen or new problems develop. The patient verbalized understanding.   Follow-up: Return in about 6 months (around 12/14/2024).   Haze LELON Servant, FNP-BC [facility] [place], [place] [phone]   06/16/2024, 1:57 PM

## 2024-06-17 ENCOUNTER — Ambulatory Visit: Payer: Self-pay | Admitting: Nurse Practitioner

## 2024-06-17 LAB — CBC WITH DIFFERENTIAL/PLATELET
Basophils Absolute: 0 x10E3/uL (ref 0.0–0.2)
Basos: 1 %
EOS (ABSOLUTE): 0.1 x10E3/uL (ref 0.0–0.4)
Eos: 2 %
Hematocrit: 42.4 % (ref 34.0–46.6)
Hemoglobin: 12.8 g/dL (ref 11.1–15.9)
Immature Grans (Abs): 0 x10E3/uL (ref 0.0–0.1)
Immature Granulocytes: 0 %
Lymphocytes Absolute: 2.2 x10E3/uL (ref 0.7–3.1)
Lymphs: 52 %
MCH: 23.7 pg — ABNORMAL LOW (ref 26.6–33.0)
MCHC: 30.2 g/dL — ABNORMAL LOW (ref 31.5–35.7)
MCV: 79 fL (ref 79–97)
Monocytes Absolute: 0.2 x10E3/uL (ref 0.1–0.9)
Monocytes: 6 %
Neutrophils Absolute: 1.6 x10E3/uL (ref 1.4–7.0)
Neutrophils: 38 %
Platelets: 263 x10E3/uL (ref 150–450)
RBC: 5.4 x10E6/uL — ABNORMAL HIGH (ref 3.77–5.28)
RDW: 15.6 % — ABNORMAL HIGH (ref 11.7–15.4)
WBC: 4.2 x10E3/uL (ref 3.4–10.8)

## 2024-06-17 LAB — CMP14+EGFR
ALT: 34 IU/L — ABNORMAL HIGH (ref 0–32)
AST: 27 IU/L (ref 0–40)
Albumin: 4.5 g/dL (ref 3.8–4.9)
Alkaline Phosphatase: 102 IU/L (ref 51–125)
BUN/Creatinine Ratio: 10 (ref 9–23)
BUN: 11 mg/dL (ref 6–24)
Bilirubin Total: 0.6 mg/dL (ref 0.0–1.2)
CO2: 19 mmol/L — ABNORMAL LOW (ref 20–29)
Calcium: 9.5 mg/dL (ref 8.7–10.2)
Chloride: 103 mmol/L (ref 96–106)
Creatinine, Ser: 1.14 mg/dL — ABNORMAL HIGH (ref 0.57–1.00)
Globulin, Total: 2.1 g/dL (ref 1.5–4.5)
Glucose: 90 mg/dL (ref 70–99)
Potassium: 3.8 mmol/L (ref 3.5–5.2)
Sodium: 138 mmol/L (ref 134–144)
Total Protein: 6.6 g/dL (ref 6.0–8.5)
eGFR: 58 mL/min/1.73 — ABNORMAL LOW (ref >59)

## 2024-06-17 LAB — HEMOGLOBIN A1C
Est. average glucose Bld gHb Est-mCnc: 120 mg/dL
Hgb A1c MFr Bld: 5.8 % — ABNORMAL HIGH (ref 4.8–5.6)

## 2024-06-17 LAB — THYROID PANEL WITH TSH
Free Thyroxine Index: 1.1 — ABNORMAL LOW (ref 1.2–4.9)
T3 Uptake Ratio: 21 % — ABNORMAL LOW (ref 24–39)
T4, Total: 5.2 ug/dL (ref 4.5–12.0)
TSH: 0.986 u[IU]/mL (ref 0.450–4.500)

## 2024-06-17 LAB — CERVICOVAGINAL ANCILLARY ONLY
Bacterial Vaginitis (gardnerella): NEGATIVE
Candida Glabrata: NEGATIVE
Candida Vaginitis: NEGATIVE
Chlamydia: NEGATIVE
Comment: NEGATIVE
Comment: NEGATIVE
Comment: NEGATIVE
Comment: NEGATIVE
Comment: NEGATIVE
Comment: NORMAL
Neisseria Gonorrhea: NEGATIVE
Trichomonas: NEGATIVE

## 2024-06-17 LAB — HSV 1 AND 2 AB, IGG
HSV 1 Glycoprotein G Ab, IgG: REACTIVE — AB
HSV 2 IgG, Type Spec: NONREACTIVE

## 2024-06-17 LAB — HIV ANTIBODY (ROUTINE TESTING W REFLEX): HIV Screen 4th Generation wRfx: NONREACTIVE

## 2024-06-17 LAB — RPR: RPR Ser Ql: NONREACTIVE

## 2024-06-25 ENCOUNTER — Other Ambulatory Visit (HOSPITAL_COMMUNITY): Payer: Self-pay | Admitting: Physician Assistant

## 2024-06-25 DIAGNOSIS — F0390 Unspecified dementia without behavioral disturbance: Secondary | ICD-10-CM

## 2024-06-27 ENCOUNTER — Telehealth (HOSPITAL_COMMUNITY): Payer: Self-pay | Admitting: *Deleted

## 2024-06-27 NOTE — Telephone Encounter (Signed)
 VM left unsure who left message but asking for a rx for clonidine  for patient. She is a patient of Lytle Bolster PA. Reveiwed chart and did not see a recent rx for clonidine . She does have a future appt with provider on 07/10/24. I will notify Lytle of this message.

## 2024-06-27 NOTE — Telephone Encounter (Signed)
 Took another look at the chart. Patient had gotten clonidine  from [place] in June and it appears med changed to mirtazapine .

## 2024-07-10 ENCOUNTER — Encounter (HOSPITAL_COMMUNITY): Payer: Self-pay | Admitting: Physician Assistant

## 2024-07-10 ENCOUNTER — Ambulatory Visit (HOSPITAL_COMMUNITY): Admitting: Physician Assistant

## 2024-07-10 VITALS — BP 132/85 | HR 85 | Temp 98.2°F | Ht 64.0 in | Wt 197.2 lb

## 2024-07-10 DIAGNOSIS — Z79899 Other long term (current) drug therapy: Secondary | ICD-10-CM

## 2024-07-10 DIAGNOSIS — F0390 Unspecified dementia without behavioral disturbance: Secondary | ICD-10-CM | POA: Diagnosis not present

## 2024-07-10 DIAGNOSIS — F251 Schizoaffective disorder, depressive type: Secondary | ICD-10-CM

## 2024-07-10 DIAGNOSIS — F411 Generalized anxiety disorder: Secondary | ICD-10-CM | POA: Diagnosis not present

## 2024-07-10 DIAGNOSIS — G2401 Drug induced subacute dyskinesia: Secondary | ICD-10-CM

## 2024-07-10 DIAGNOSIS — G479 Sleep disorder, unspecified: Secondary | ICD-10-CM | POA: Diagnosis not present

## 2024-07-10 MED ORDER — MIRTAZAPINE 15 MG PO TABS: 15.0000 mg | ORAL_TABLET | Freq: Every day | ORAL | 2 refills | Status: DC

## 2024-07-10 MED ORDER — AUSTEDO XR 30 MG PO TB24: 30.0000 mg | ORAL_TABLET | Freq: Every day | ORAL | 2 refills | Status: DC

## 2024-07-10 MED ORDER — QUETIAPINE FUMARATE 400 MG PO TABS: 400.0000 mg | ORAL_TABLET | Freq: Every day | ORAL | 2 refills | Status: DC

## 2024-07-10 MED ORDER — DONEPEZIL HCL 5 MG PO TABS: 5.0000 mg | ORAL_TABLET | Freq: Every day | ORAL | 2 refills | Status: DC

## 2024-07-10 MED ORDER — CLONIDINE HCL 0.1 MG PO TABS: 0.1000 mg | ORAL_TABLET | Freq: Every day | ORAL | 2 refills | Status: AC

## 2024-07-10 MED ORDER — BELSOMRA 10 MG PO TABS: 10.0000 mg | ORAL_TABLET | Freq: Every evening | ORAL | 0 refills | Status: DC | PRN

## 2024-07-10 MED ORDER — FLUOXETINE HCL 40 MG PO CAPS
40.0000 mg | ORAL_CAPSULE | Freq: Every day | ORAL | 2 refills | Status: AC
Start: 2024-07-10 — End: ?

## 2024-07-10 NOTE — Progress Notes (Signed)
 [facility] MD/PA/NP OP Progress Note  07/10/2024 2:00 PM Jody [name]  MRN:  [id number]  Chief Complaint:  Chief Complaint  Patient presents with   Follow-up   Medication Refill   HPI:   Jody [name] is a 53 year old, African-American female with a past psychiatric history significant for schizoaffective disorder (depressed type), major depressive disorder, generalized anxiety disorder, sleep disturbances, and major neurocognitive disorder who presents to [facility] for follow up and medication management. Patient is currently being managed on the following psychiatric medications:  Seroquel  400 mg at bedtime Prozac  40 mg daily Belsomra  10 mg at bedtime Austedo  XR 30 mg daily Donepezil  5 mg daily Mirtazapine  15 mg at bedtime  Patient presents to the encounter stating that she continues to take her medications regularly and denies experiencing any adverse side effects.  Patient endorses some depression attributed to ruminating over the anniversary of her mother's passing.  Patient rates her depression a 4 out of 10 with 10 being most severe.  Patient endorses depressive episodes 3 days out of the week.  Patient endorses the following depressive symptoms: feelings of sadness, lack of motivation, decreased concentration, decreased energy, irritability, feelings of guilt/worthlessness, and hopelessness.  Patient also endorses anxiety and rates her anxiety a 3 out of 10.  Patient denies any new stressors at this time.  Patient denies paranoia and further denies auditory or visual hallucinations.  A PHQ-9 screen was performed with the patient scoring an 18.  A GAD-7 screen was also performed with the patient scoring an 18.  Patient is alert and oriented x 4, calm, cooperative, and fully engaged in conversation during the encounter.  Patient describes her mood as poor but states that it is not too poor.  Patient exhibits depressed mood with appropriate  affect.  Patient denies suicidal or homicidal ideations.  She further denies auditory or visual hallucinations and does not appear to be responding to internal/external stimuli.  Patient endorses poor sleep and receives on average 4 hours of sleep per night.  Patient endorses good appetite and eats on average 3 meals per day.  Patient denies alcohol consumption, tobacco use, or illicit drug use.  Visit Diagnosis:    ICD-10-CM   1. Sleep disturbances  G47.9 Suvorexant  (BELSOMRA ) 10 MG TABS    Suvorexant  (BELSOMRA ) 10 MG TABS    2. Schizoaffective disorder, depressive type (HCC)  F25.1 mirtazapine  (REMERON ) 15 MG tablet    FLUoxetine  (PROZAC ) 40 MG capsule    QUEtiapine  (SEROQUEL ) 400 MG tablet    Deutetrabenazine  ER (AUSTEDO  XR) 30 MG TB24    3. GAD (generalized anxiety disorder)  F41.1 mirtazapine  (REMERON ) 15 MG tablet    FLUoxetine  (PROZAC ) 40 MG capsule    4. Major neurocognitive disorder (HCC)  F03.90 cloNIDine  (CATAPRES ) 0.1 MG tablet    FLUoxetine  (PROZAC ) 40 MG capsule    donepezil  (ARICEPT ) 5 MG tablet    5. Tardive dyskinesia  G24.01 Deutetrabenazine  ER (AUSTEDO  XR) 30 MG TB24      Past Psychiatric History:  Schizophrenia - diagnosed roughly 7 years ago when patient was attending [facility] Patient was diagnosed with schizoaffective disorder (depressive type) when admitted to [facility] from 04/08/2022 - 04/12/2022.  In addition to being diagnosed with schizoaffective disorder, patient was treated for insomnia, generalized anxiety disorder, and tardive dyskinesia.   **Onset of dementia, major neurocognitive disorder, due to Alzheimer's disease, without behavioral disturbance, mild  Past Medical History:  Past Medical History:  Diagnosis Date  Anxiety and depression    Cystitis, interstitial    Elevated troponin    a. 09/2015: CP/elevated troponin up to 0.11 - unclear significance. CT neg for PE, LHC neg for CAD, normal echo.   Hypertension     Microcytic anemia    Noted on labs   Panic attack    Paranoid schizophrenia (HCC)    Tobacco abuse     Past Surgical History:  Procedure Laterality Date   CARDIAC CATHETERIZATION N/A 09/29/2015   Procedure: Left Heart Cath and Coronary Angiography;  Surgeon: Candyce GORMAN Reek, MD;  Location: [facility] INVASIVE CV LAB;  Service: Cardiovascular;  Laterality: N/A;   TUBAL LIGATION      Family Psychiatric History:  Grandmother (maternal) - unsure of the diagnosis but states that her grandmother had mental health issues Uncle (maternal) - Schizophrenia and bipolar disorder, he is currently taking medications but is unsure of the type.   Family history of suicide: Patient denies Family history of homicide: Patient denies Family history of substance abuse: Per patient's sister, alcohol abuse runs in the family  Family History:  Family History  Problem Relation Age of Onset   Colon polyps Mother    Hypertension Mother    Diabetes Father    Hypertension Sister    Lupus Sister    Heart disease Maternal Grandfather    Lupus Other    BRCA 1/2 Neg Hx    Breast cancer Neg Hx    Colon cancer Neg Hx    Rectal cancer Neg Hx    Stomach cancer Neg Hx    Esophageal cancer Neg Hx     Social History:  Social History   Socioeconomic History   Marital status: Single    Spouse name: Not on file   Number of children: 5   Years of education: 12   Highest education level: Not on file  Occupational History   Occupation: Disabled  Tobacco Use   Smoking status: Former    Current packs/day: 0.00    Average packs/day: 0.3 packs/day for 4.0 years (1.0 ttl pk-yrs)    Types: Cigarettes    Start date: 05/01/2012    Quit date: 05/01/2016    Years since quitting: 8.1   Smokeless tobacco: Never  Vaping Use   Vaping status: Never Used  Substance and Sexual Activity   Alcohol use: Not Currently    Alcohol/week: 0.0 standard drinks of alcohol    Comment: Quit 2017   Drug use: No   Sexual activity:  Not Currently    Birth control/protection: Condom  Other Topics Concern   Not on file  Social History Narrative   Lives alone   Caffeine use: Soda- daily   Right-handed   Social Drivers of Health   Financial Resource Strain: Low Risk  (12/25/2023)   Overall Financial Resource Strain (CARDIA)    Difficulty of Paying Living Expenses: Not hard at all  Food Insecurity: No Food Insecurity (12/25/2023)   Hunger Vital Sign    Worried About Running Out of Food in the Last Year: Never true    Ran Out of Food in the Last Year: Never true  Transportation Needs: No Transportation Needs (12/25/2023)   PRAPARE - Administrator, Civil Service (Medical): No    Lack of Transportation (Non-Medical): No  Physical Activity: Insufficiently Active (12/25/2023)   Exercise Vital Sign    Days of Exercise per Week: 1 day    Minutes of Exercise per Session: 20 min  Stress: No  Stress Concern Present (12/25/2023)   [employer] of Occupational Health - Occupational Stress Questionnaire    Feeling of Stress : Not at all  Recent Concern: Stress - Stress Concern Present (11/09/2023)   [employer] of Occupational Health - Occupational Stress Questionnaire    Feeling of Stress : To some extent  Social Connections: Moderately Isolated (12/25/2023)   Social Connection and Isolation Panel    Frequency of Communication with Friends and Family: Once a week    Frequency of Social Gatherings with Friends and Family: Once a week    Attends Religious Services: 1 to 4 times per year    Active Member of [employer] or Organizations: Yes    Attends Banker Meetings: 1 to 4 times per year    Marital Status: Divorced    Allergies:  Allergies  Allergen Reactions   Penicillins Itching and Rash    Metabolic Disorder Labs: Lab Results  Component Value Date   HGBA1C 5.8 (H) 06/16/2024   MPG 114.02 04/07/2022   MPG 117 09/28/2015   Lab Results  Component Value Date   PROLACTIN 9.7 12/01/2022    PROLACTIN 17.2 04/07/2022   Lab Results  Component Value Date   CHOL 152 11/09/2023   TRIG 120 11/09/2023   HDL 41 11/09/2023   CHOLHDL 3.7 11/09/2023   VLDL 56 (H) 04/09/2022   LDLCALC 89 11/09/2023   LDLCALC 72 05/04/2023   Lab Results  Component Value Date   TSH 0.986 06/16/2024   TSH 5.855 (H) 04/07/2022    Therapeutic Level Labs: No results found for: LITHIUM Lab Results  Component Value Date   VALPROATE 118 (H) 04/26/2016   VALPROATE 136 (H) 04/26/2016   No results found for: CBMZ  Current Medications: Current Outpatient Medications  Medication Sig Dispense Refill   [START ON 08/08/2024] Suvorexant  (BELSOMRA ) 10 MG TABS Take 1 tablet (10 mg total) by mouth at bedtime as needed. 30 tablet 0   atorvastatin  (LIPITOR) 10 MG tablet Take 1 tablet (10 mg total) by mouth daily. Please schedule office visit for additional refills. 28 tablet 3   cloNIDine  (CATAPRES ) 0.1 MG tablet Take 1 tablet (0.1 mg total) by mouth at bedtime. 30 tablet 2   Deutetrabenazine  ER (AUSTEDO  XR) 30 MG TB24 Take 30 mg by mouth daily. 30 tablet 2   donepezil  (ARICEPT ) 5 MG tablet Take 1 tablet (5 mg total) by mouth at bedtime. 30 tablet 2   ferrous sulfate  (FEROSUL) 325 (65 FE) MG tablet Take 1 tablet (325 mg total) by mouth daily with breakfast. 28 tablet 3   FLUoxetine  (PROZAC ) 40 MG capsule Take 1 capsule (40 mg total) by mouth daily. 90 capsule 2   gabapentin  (NEURONTIN ) 100 MG capsule TAKE 1 CAPSULE BY MOUTH TWICE A DAY (MORNING AND EVENING) 56 capsule 1   hydrochlorothiazide  (HYDRODIURIL ) 25 MG tablet TAKE 1 TABLET BY MOUTH DAILY 28 tablet 3   mirtazapine  (REMERON ) 15 MG tablet Take 1 tablet (15 mg total) by mouth at bedtime. 30 tablet 2   nystatin  (MYCOSTATIN ) 100000 UNIT/ML suspension SWISH AND SPIT 5 mLs (500,000 Units total) by mouth 4 (four) times daily. 60 mL 0   QUEtiapine  (SEROQUEL ) 400 MG tablet Take 1 tablet (400 mg total) by mouth at bedtime. 30 tablet 2   Suvorexant  (BELSOMRA )  10 MG TABS Take 1 tablet (10 mg total) by mouth at bedtime as needed. 30 tablet 0   tirzepatide  (ZEPBOUND ) 2.5 MG/0.5ML injection vial Inject 2.5 mg into the skin once  a week. (Patient not taking: Reported on 06/16/2024) 2 mL 0   trimethoprim  (TRIMPEX ) 100 MG tablet Take 100 mg by mouth daily.     Current Facility-Administered Medications  Medication Dose Route Frequency Provider Last Rate Last Admin   0.9 %  sodium chloride  infusion  500 mL Intravenous Once Pyrtle, Gordy HERO, MD         Musculoskeletal: Strength & Muscle Tone: within normal limits Gait & Station: normal Patient leans: N/A  Psychiatric Specialty Exam: Review of Systems  Psychiatric/Behavioral:  Positive for sleep disturbance. Negative for decreased concentration, dysphoric mood, hallucinations, self-injury and suicidal ideas. The patient is nervous/anxious. The patient is not hyperactive.     Blood pressure 132/85, pulse 85, temperature 98.2 F (36.8 C), temperature source Oral, height 5' 4 (1.626 m), weight 197 lb 3.2 oz (89.4 kg), SpO2 97%.Body mass index is 33.85 kg/m.  General Appearance: Casual  Eye Contact:  Good  Speech:  Clear and Coherent and Normal Rate  Volume:  Normal  Mood:  Anxious and Depressed  Affect:  Appropriate  Thought Process:  Coherent, Goal Directed, and Descriptions of Associations: Intact  Orientation:  Full (Time, Place, and Person)  Thought Content: WDL and Paranoid Ideation   Suicidal Thoughts:  No  Homicidal Thoughts:  No  Memory:  Immediate;   Good Recent;   Fair Remote;   Fair  Judgement:  Good  Insight:  Good  Psychomotor Activity:  TD  Concentration:  Concentration: Good and Attention Span: Good  Recall:  Good  Fund of Knowledge: Fair  Language: Good  Akathisia:  No  Handed:  Right  AIMS (if indicated): done  Assets:  Communication Skills Desire for Improvement Housing Social Support  ADL's:  Intact  Cognition: Impaired,  Mild  Sleep:  Fair   Screenings: AIMS     Flowsheet Row Clinical Support from 07/10/2024 in [facility] Clinical Support from 06/05/2024 in [facility] Clinical Support from 04/22/2024 in [facility] Clinical Support from 03/11/2024 in [facility] Office Visit from 12/08/2022 in [facility]  AIMS Total Score 8 7 3 9 8    AUDIT    Flowsheet Row [facility]) [facility] ([facility]  Alcohol Use Disorder Identification Test Final Score (AUDIT) 9 4 20    GAD-7    Flowsheet Row Clinical Support from 07/10/2024 in [facility] Office Visit from 06/16/2024 in [facility] [place] [facility] [place] Clinical Support from 06/05/2024 in [facility] Clinical Support from 04/22/2024 in [facility] Office Visit from 03/14/2024 in [facility] [place] [facility] [place]. [facility]  Total GAD-7 Score 18 12 6 15 16    Mini-Mental    Flowsheet Row Office Visit from 01/31/2017 in [facility] Office Visit from 11/27/2016 in [facility] Office Visit from 06/28/2016 in [facility] Office Visit from 05/25/2016 in [facility] [place] [facility] Dept Of . [facility] Office Visit from 03/11/2015 in [facility] Neu[facility]  Total Score (max 30 points ) 25 11 18 15 26    PHQ2-9    Flowsheet Row  Clinical Support from 07/10/2024 in [facility] Office Visit from 06/16/2024 in [facility] [place] [facility] [place]. [facility] Clinical Support from 06/05/2024 in [facility] Clinical Support from 04/22/2024 in [facility] Office Visit from 03/14/2024 in [facility] [place] [facility] [place]. [facility]  PHQ-2 Total Score 6 2 2 4 5   PHQ-9 Total Score 18 7 9 13 12    Flowsheet Row Clinical Support from 07/10/2024 in [facility] Clinical Support from 06/05/2024 in [facility] Clinical Support from 04/22/2024 in [facility]  C-SSRS RISK CATEGORY Moderate Risk Moderate Risk Moderate Risk     Assessment and Plan:   Jody [name] is a 53 year old, African-American female with a past psychiatric history significant for schizoaffective disorder (depressed type), major depressive disorder, generalized anxiety disorder, sleep disturbances, and major neurocognitive disorder who presents to [facility] for follow up and medication management.  Patient presents to the encounter stating that she continues to take her medications regularly and denies experiencing any adverse side effects.  An aims assessment was performed with the patient scoring on 8.  Patient reports that she continues to take her Austedo  regularly and states that it has been helpful in managing her involuntary movements.  Patient continues to endorse depression and anxiety attributed to ruminating over the anniversary of her mother's passing.  A PHQ-9 screen was performed with the patient scoring an 18.  A GAD-7 screen was also performed with patient scoring an 18.   despite her depressive symptoms and anxiety, patient reports that her symptoms are manageable and denies the need for dosage adjustments at this time.  Patient's medications to be e-prescribed to pharmacy of choice.  Patient's labs are up-to-date.  Provider to provide an EKG appointment  for the patient in the near future.  A [place] Suicide Severity Rating Scale was performed with the patient being considered moderate risk.  Patient denies suicidal ideations and is able to contract for safety at this time.  Safety planning was discussed with the patient prior to the conclusion of the encounter.  - Patient was instructed to contact 911 in the event of a mental health crisis. - Patient was instructed to contact 9[facility] Suicide and Crisis [facility]eline in the event of a mental health crisis. - Patient was instructed to present to [facility] in the event of a mental health crisis.  Collaboration of Care: Collaboration of Care: Medication Management AEB provider managing patient's psychiatric medications, Primary Care Provider AEB patient being seen by a primary care provider at [facility], Psychiatrist AEB patient being seen by mental health provider at this facility, and Referral or follow-up with counselor/therapist AEB patient being seen by a licensed clinical social worker at this facility  Patient/Guardian was advised Release of Information must be obtained prior to any record release in order to collaborate their care with an outside provider. Patient/Guardian was advised if they have not already done so to contact the registration department to sign all necessary forms in order for us  to release information regarding their care.   Consent: Patient/Guardian gives verbal consent for treatment and assignment of benefits for services provided during this visit. Patient/Guardian expressed understanding and agreed to proceed.   1. Sleep disturbances  - Suvorexant  (BELSOMRA ) 10 MG TABS; Take  1 tablet (10 mg total) by mouth at bedtime as needed.  Dispense: 30 tablet; Refill: 0 - Suvorexant  (BELSOMRA ) 10 MG TABS; Take 1 tablet (10 mg total) by mouth at bedtime as needed.  Dispense: 30 tablet; Refill: 0  2. Schizoaffective disorder,  depressive type (HCC)  - mirtazapine  (REMERON ) 15 MG tablet; Take 1 tablet (15 mg total) by mouth at bedtime.  Dispense: 30 tablet; Refill: 2 - FLUoxetine  (PROZAC ) 40 MG capsule; Take 1 capsule (40 mg total) by mouth daily.  Dispense: 90 capsule; Refill: 2 - QUEtiapine  (SEROQUEL ) 400 MG tablet; Take 1 tablet (400 mg total) by mouth at bedtime.  Dispense: 30 tablet; Refill: 2 - Deutetrabenazine  ER (AUSTEDO  XR) 30 MG TB24; Take 30 mg by mouth daily.  Dispense: 30 tablet; Refill: 2  3. GAD (generalized anxiety disorder)  - mirtazapine  (REMERON ) 15 MG tablet; Take 1 tablet (15 mg total) by mouth at bedtime.  Dispense: 30 tablet; Refill: 2 - FLUoxetine  (PROZAC ) 40 MG capsule; Take 1 capsule (40 mg total) by mouth daily.  Dispense: 90 capsule; Refill: 2  4. Major neurocognitive disorder (HCC)  - cloNIDine  (CATAPRES ) 0.1 MG tablet; Take 1 tablet (0.1 mg total) by mouth at bedtime.  Dispense: 30 tablet; Refill: 2 - FLUoxetine  (PROZAC ) 40 MG capsule; Take 1 capsule (40 mg total) by mouth daily.  Dispense: 90 capsule; Refill: 2 - donepezil  (ARICEPT ) 5 MG tablet; Take 1 tablet (5 mg total) by mouth at bedtime.  Dispense: 30 tablet; Refill: 2  5. Tardive dyskinesia  - Deutetrabenazine  ER (AUSTEDO  XR) 30 MG TB24; Take 30 mg by mouth daily.  Dispense: 30 tablet; Refill: 2  6. Long term current use of antipsychotic medication (Primary) Provider to order an EKG for the patient  Patient to follow-up in 2 months Provider spent a total of 21 minutes with the patient/reviewing patient's chart  Jody FORBES Bolster, PA 07/10/2024, 2:00 PM

## 2024-07-17 ENCOUNTER — Other Ambulatory Visit: Payer: Self-pay | Admitting: Nurse Practitioner

## 2024-07-17 ENCOUNTER — Other Ambulatory Visit: Payer: Self-pay | Admitting: Family Medicine

## 2024-07-17 DIAGNOSIS — D649 Anemia, unspecified: Secondary | ICD-10-CM

## 2024-07-17 DIAGNOSIS — I10 Essential (primary) hypertension: Secondary | ICD-10-CM

## 2024-07-17 DIAGNOSIS — E7800 Pure hypercholesterolemia, unspecified: Secondary | ICD-10-CM

## 2024-08-12 ENCOUNTER — Other Ambulatory Visit: Payer: Self-pay | Admitting: Nurse Practitioner

## 2024-08-12 DIAGNOSIS — R232 Flushing: Secondary | ICD-10-CM

## 2024-08-22 ENCOUNTER — Telehealth (HOSPITAL_COMMUNITY): Payer: Self-pay

## 2024-08-22 NOTE — Telephone Encounter (Signed)
 Patient called in requesting communication from provider about obtaining a different formulary for Austedo  XR 30 mg.

## 2024-09-10 ENCOUNTER — Other Ambulatory Visit (HOSPITAL_COMMUNITY): Payer: Self-pay | Admitting: Physician Assistant

## 2024-09-10 DIAGNOSIS — G479 Sleep disorder, unspecified: Secondary | ICD-10-CM

## 2024-09-11 ENCOUNTER — Ambulatory Visit (HOSPITAL_COMMUNITY): Admitting: Physician Assistant

## 2024-09-11 VITALS — BP 134/87 | HR 87 | Ht 64.0 in | Wt 193.4 lb

## 2024-09-11 DIAGNOSIS — G2401 Drug induced subacute dyskinesia: Secondary | ICD-10-CM

## 2024-09-11 DIAGNOSIS — G479 Sleep disorder, unspecified: Secondary | ICD-10-CM

## 2024-09-11 DIAGNOSIS — Z79899 Other long term (current) drug therapy: Secondary | ICD-10-CM

## 2024-09-11 DIAGNOSIS — F251 Schizoaffective disorder, depressive type: Secondary | ICD-10-CM

## 2024-09-11 DIAGNOSIS — F0390 Unspecified dementia without behavioral disturbance: Secondary | ICD-10-CM | POA: Diagnosis not present

## 2024-09-11 DIAGNOSIS — F411 Generalized anxiety disorder: Secondary | ICD-10-CM | POA: Diagnosis not present

## 2024-09-11 MED ORDER — MIRTAZAPINE 15 MG PO TABS: 15.0000 mg | ORAL_TABLET | Freq: Every day | ORAL | 2 refills | Status: DC

## 2024-09-11 MED ORDER — QUETIAPINE FUMARATE 400 MG PO TABS: 400.0000 mg | ORAL_TABLET | Freq: Every day | ORAL | 2 refills | Status: DC

## 2024-09-11 MED ORDER — BELSOMRA 10 MG PO TABS: 10.0000 mg | ORAL_TABLET | Freq: Every evening | ORAL | 0 refills | Status: AC | PRN

## 2024-09-11 MED ORDER — FLUOXETINE HCL 40 MG PO CAPS: 40.0000 mg | ORAL_CAPSULE | Freq: Every day | ORAL | 2 refills | Status: DC

## 2024-09-11 MED ORDER — DONEPEZIL HCL 5 MG PO TABS: 5.0000 mg | ORAL_TABLET | Freq: Every day | ORAL | 2 refills | Status: DC

## 2024-09-16 ENCOUNTER — Telehealth (HOSPITAL_COMMUNITY): Payer: Self-pay | Admitting: Physician Assistant

## 2024-09-16 ENCOUNTER — Encounter (HOSPITAL_COMMUNITY): Payer: Self-pay | Admitting: Physician Assistant

## 2024-09-16 MED ORDER — AUSTEDO XR 30 MG PO TB24: 30.0000 mg | ORAL_TABLET | Freq: Every day | ORAL | 2 refills | Status: AC

## 2024-09-16 NOTE — Progress Notes (Signed)
 [facility] MD/PA/NP OP Progress Note  09/11/2024 2:00 PM Jody [name]  MRN:  [id number]  Chief Complaint:  Chief Complaint  Patient presents with   Follow-up   Medication Refill   HPI:   Jody [name] is a 53 year old, African-American female with a past psychiatric history significant for schizoaffective disorder (depressed type), major depressive disorder, generalized anxiety disorder, sleep disturbances, and major neurocognitive disorder who presents to [facility] for follow up and medication management. Patient is currently being managed on the following psychiatric medications:  Seroquel  400 mg at bedtime Prozac  40 mg daily Belsomra  10 mg at bedtime Austedo  XR 30 mg daily Donepezil  5 mg daily Mirtazapine  15 mg at bedtime  Patient presents to the encounter expressing concerns over the use of her Austedo .  She reports that her current insurance requires a prior authorization in order for her to continue taking the medication at a reduced cost.  Provider to contact the appropriate service to complete the authorization for the patient.  Patient reports that she continues to take her medications as prescribed and denies experiencing any adverse side effects.  She endorses depression and rates her depression a 4 out of 10 with 10 being most severe.  Patient endorses depressive episode 2 to 3 days/week.  Patient endorses the following depressive symptoms: feelings of sadness, lack of motivation, decreased concentration, decreased energy, irritability, feelings of guilt/worthlessness, and hopelessness.  Patient endorses anxiety but states that it has been fairly manageable.  Patient rates her anxiety a 3 out of 10.  Patient denies any changes in her behavior.  She reports that she occasionally experiences paranoia characterized by the thought that people are talking about her.  Patient denies experiencing auditory or visual hallucinations.  A PHQ-9  screen was performed with the patient scoring a 22.  A GAD-7 screen was also performed with the patient scoring a 17.  Patient is alert and oriented x 4, calm, cooperative, and fully engaged in conversation during the encounter.  Patient endorses stable mood.  Patient exhibits depressed mood with appropriate affect.  Patient denies suicidal or homicidal ideations.  She further denies auditory or visual hallucinations and does not appear to be responding to internal/external stimuli.  Patient endorses fair sleep and receives on average 4 to 5 hours of sleep per night.  Patient endorses fair appetite and eats on average 2 meals per day.  Patient denies alcohol consumption or illicit drug use.  Patient endorses tobacco use and smokes on average 3 to 4 cigarettes/day.  Visit Diagnosis:    ICD-10-CM   1. Sleep disturbances  G47.9 Suvorexant  (BELSOMRA ) 10 MG TABS    2. Schizoaffective disorder, depressive type (HCC)  F25.1 mirtazapine  (REMERON ) 15 MG tablet    FLUoxetine  (PROZAC ) 40 MG capsule    QUEtiapine  (SEROQUEL ) 400 MG tablet    Deutetrabenazine  ER (AUSTEDO  XR) 30 MG TB24    3. GAD (generalized anxiety disorder)  F41.1 mirtazapine  (REMERON ) 15 MG tablet    FLUoxetine  (PROZAC ) 40 MG capsule    4. Major neurocognitive disorder (HCC)  F03.90 FLUoxetine  (PROZAC ) 40 MG capsule    donepezil  (ARICEPT ) 5 MG tablet    5. Tardive dyskinesia  G24.01 Deutetrabenazine  ER (AUSTEDO  XR) 30 MG TB24      Past Psychiatric History:  Schizophrenia - diagnosed roughly 7 years ago when patient was attending [facility] Patient was diagnosed with schizoaffective disorder (depressive type) when admitted to [facility] from 04/08/2022 - 04/12/2022.  In addition  to being diagnosed with schizoaffective disorder, patient was treated for insomnia, generalized anxiety disorder, and tardive dyskinesia.   **Onset of dementia, major neurocognitive disorder, due to Alzheimer's disease, without  behavioral disturbance, mild  Past Medical History:  Past Medical History:  Diagnosis Date   Anxiety and depression    Cystitis, interstitial    Elevated troponin    a. 09/2015: CP/elevated troponin up to 0.11 - unclear significance. CT neg for PE, LHC neg for CAD, normal echo.   Hypertension    Microcytic anemia    Noted on labs   Panic attack    Paranoid schizophrenia (HCC)    Tobacco abuse     Past Surgical History:  Procedure Laterality Date   CARDIAC CATHETERIZATION N/A 09/29/2015   Procedure: Left Heart Cath and Coronary Angiography;  Surgeon: Candyce GORMAN Reek, MD;  Location: [facility] INVASIVE CV LAB;  Service: Cardiovascular;  Laterality: N/A;   TUBAL LIGATION      Family Psychiatric History:  Grandmother (maternal) - unsure of the diagnosis but states that her grandmother had mental health issues Uncle (maternal) - Schizophrenia and bipolar disorder, he is currently taking medications but is unsure of the type.   Family history of suicide: Patient denies Family history of homicide: Patient denies Family history of substance abuse: Per patient's sister, alcohol abuse runs in the family  Family History:  Family History  Problem Relation Age of Onset   Colon polyps Mother    Hypertension Mother    Diabetes Father    Hypertension Sister    Lupus Sister    Heart disease Maternal Grandfather    Lupus Other    BRCA 1/2 Neg Hx    Breast cancer Neg Hx    Colon cancer Neg Hx    Rectal cancer Neg Hx    Stomach cancer Neg Hx    Esophageal cancer Neg Hx     Social History:  Social History   Socioeconomic History   Marital status: Single    Spouse name: Not on file   Number of children: 5   Years of education: 12   Highest education level: Not on file  Occupational History   Occupation: Disabled  Tobacco Use   Smoking status: Former    Current packs/day: 0.00    Average packs/day: 0.3 packs/day for 4.0 years (1.0 ttl pk-yrs)    Types: Cigarettes    Start date:  05/01/2012    Quit date: 05/01/2016    Years since quitting: 8.3   Smokeless tobacco: Never  Vaping Use   Vaping status: Never Used  Substance and Sexual Activity   Alcohol use: Not Currently    Alcohol/week: 0.0 standard drinks of alcohol    Comment: Quit 2017   Drug use: No   Sexual activity: Not Currently    Birth control/protection: Condom  Other Topics Concern   Not on file  Social History Narrative   Lives alone   Caffeine use: Soda- daily   Right-handed   Social Drivers of Health   Tobacco Use: Medium Risk (09/16/2024)   Patient History    Smoking Tobacco Use: Former    Smokeless Tobacco Use: Never    Passive Exposure: Not on Actuary Strain: Low Risk (12/25/2023)   Overall Financial Resource Strain (CARDIA)    Difficulty of Paying Living Expenses: Not hard at all  Food Insecurity: No Food Insecurity (12/25/2023)   Hunger Vital Sign    Worried About Running Out of Food in the Last Year:  Never true    Ran Out of Food in the Last Year: Never true  Transportation Needs: No Transportation Needs (12/25/2023)   PRAPARE - Administrator, Civil Service (Medical): No    Lack of Transportation (Non-Medical): No  Physical Activity: Insufficiently Active (12/25/2023)   Exercise Vital Sign    Days of Exercise per Week: 1 day    Minutes of Exercise per Session: 20 min  Stress: No Stress Concern Present (12/25/2023)   [employer] of Occupational Health - Occupational Stress Questionnaire    Feeling of Stress : Not at all  Recent Concern: Stress - Stress Concern Present (11/09/2023)   [employer] of Occupational Health - Occupational Stress Questionnaire    Feeling of Stress : To some extent  Social Connections: Moderately Isolated (12/25/2023)   Social Connection and Isolation Panel    Frequency of Communication with Friends and Family: Once a week    Frequency of Social Gatherings with Friends and Family: Once a week    Attends Religious  Services: 1 to 4 times per year    Active Member of Clubs or Organizations: Yes    Attends Banker Meetings: 1 to 4 times per year    Marital Status: Divorced  Depression (PHQ2-9): High Risk (09/11/2024)   Depression (PHQ2-9)    PHQ-2 Score: 22  Alcohol Screen: Low Risk (12/25/2023)   Alcohol Screen    Last Alcohol Screening Score (AUDIT): 0  Housing: Low Risk (12/25/2023)   Housing Stability Vital Sign    Unable to Pay for Housing in the Last Year: No    Number of Times Moved in the Last Year: 0    Homeless in the Last Year: No  Utilities: Not At Risk (12/25/2023)   AHC Utilities    Threatened with loss of utilities: No  Health Literacy: Adequate Health Literacy (12/25/2023)   B1300 Health Literacy    Frequency of need for help with medical instructions: Never    Allergies:  Allergies  Allergen Reactions   Penicillins Itching and Rash    Metabolic Disorder Labs: Lab Results  Component Value Date   HGBA1C 5.8 (H) 06/16/2024   MPG 114.02 04/07/2022   MPG 117 09/28/2015   Lab Results  Component Value Date   PROLACTIN 9.7 12/01/2022   PROLACTIN 17.2 04/07/2022   Lab Results  Component Value Date   CHOL 152 11/09/2023   TRIG 120 11/09/2023   HDL 41 11/09/2023   CHOLHDL 3.7 11/09/2023   VLDL 56 (H) 04/09/2022   LDLCALC 89 11/09/2023   LDLCALC 72 05/04/2023   Lab Results  Component Value Date   TSH 0.986 06/16/2024   TSH 5.855 (H) 04/07/2022    Therapeutic Level Labs: No results found for: LITHIUM Lab Results  Component Value Date   VALPROATE 118 (H) 04/26/2016   VALPROATE 136 (H) 04/26/2016   No results found for: CBMZ  Current Medications: Current Outpatient Medications  Medication Sig Dispense Refill   atorvastatin  (LIPITOR) 10 MG tablet Take 1 tablet (10 mg total) by mouth daily. 28 tablet 3   cloNIDine  (CATAPRES ) 0.1 MG tablet Take 1 tablet (0.1 mg total) by mouth at bedtime. 30 tablet 2   Deutetrabenazine  ER (AUSTEDO  XR) 30 MG TB24  Take 30 mg by mouth daily. 30 tablet 2   donepezil  (ARICEPT ) 5 MG tablet Take 1 tablet (5 mg total) by mouth at bedtime. 30 tablet 2   FEROSUL 325 (65 Fe) MG tablet TAKE 1 TABLET (325 MG  TOTAL) BY MOUTH DAILY WITH BREAKFAST. 28 tablet 3   FLUoxetine  (PROZAC ) 40 MG capsule Take 1 capsule (40 mg total) by mouth daily. 90 capsule 2   gabapentin  (NEURONTIN ) 100 MG capsule TAKE 1 CAPSULE BY MOUTH TWICE A DAY (MORNING AND EVENING) 14 capsule 0   hydrochlorothiazide  (HYDRODIURIL ) 25 MG tablet TAKE 1 TABLET BY MOUTH DAILY 28 tablet 3   mirtazapine  (REMERON ) 15 MG tablet Take 1 tablet (15 mg total) by mouth at bedtime. 30 tablet 2   nystatin  (MYCOSTATIN ) 100000 UNIT/ML suspension SWISH AND SPIT 5 mLs (500,000 Units total) by mouth 4 (four) times daily. 60 mL 0   QUEtiapine  (SEROQUEL ) 400 MG tablet Take 1 tablet (400 mg total) by mouth at bedtime. 30 tablet 2   Suvorexant  (BELSOMRA ) 10 MG TABS Take 1 tablet (10 mg total) by mouth at bedtime as needed. 30 tablet 0   Suvorexant  (BELSOMRA ) 10 MG TABS Take 1 tablet (10 mg total) by mouth at bedtime as needed. 30 tablet 0   tirzepatide  (ZEPBOUND ) 2.5 MG/0.5ML injection vial Inject 2.5 mg into the skin once a week. (Patient not taking: Reported on 06/16/2024) 2 mL 0   trimethoprim  (TRIMPEX ) 100 MG tablet Take 100 mg by mouth daily.     Current Facility-Administered Medications  Medication Dose Route Frequency Provider Last Rate Last Admin   0.9 %  sodium chloride  infusion  500 mL Intravenous Once Pyrtle, Gordy HERO, MD         Musculoskeletal: Strength & Muscle Tone: within normal limits Gait & Station: normal Patient leans: N/A  Psychiatric Specialty Exam: Review of Systems  Psychiatric/Behavioral:  Positive for sleep disturbance. Negative for decreased concentration, dysphoric mood, hallucinations, self-injury and suicidal ideas. The patient is nervous/anxious. The patient is not hyperactive.     Blood pressure 134/87, pulse 87, height 5' 4 (1.626 m),  weight 193 lb 6.4 oz (87.7 kg), SpO2 100%.Body mass index is 33.2 kg/m.  General Appearance: Casual  Eye Contact:  Good  Speech:  Clear and Coherent and Normal Rate  Volume:  Normal  Mood:  Anxious and Depressed  Affect:  Appropriate  Thought Process:  Coherent, Goal Directed, and Descriptions of Associations: Intact  Orientation:  Full (Time, Place, and Person)  Thought Content: WDL and Paranoid Ideation   Suicidal Thoughts:  No  Homicidal Thoughts:  No  Memory:  Immediate;   Good Recent;   Fair Remote;   Fair  Judgement:  Good  Insight:  Good  Psychomotor Activity:  TD  Concentration:  Concentration: Good and Attention Span: Good  Recall:  Good  Fund of Knowledge: Fair  Language: Good  Akathisia:  No  Handed:  Right  AIMS (if indicated): done  Assets:  Communication Skills Desire for Improvement Housing Social Support  ADL's:  Intact  Cognition: Impaired,  Mild  Sleep:  Fair   Screenings: AIMS    Flowsheet Row Clinical Support from 09/11/2024 in [facility] Clinical Support from 07/10/2024 in [facility] Clinical Support from 06/05/2024 in [facility] Clinical Support from 04/22/2024 in [facility] Clinical Support from 03/11/2024 in [facility]  AIMS Total Score 6 8 7 3 9    AUDIT    Flowsheet Row [facility]) [facility]  Alcohol Use  Disorder Identification Test Final Score (AUDIT) 9 4 20    GAD-7    Flowsheet Row Clinical Support from 09/11/2024 in [facility] Clinical Support from 07/10/2024 in [facility] Office Visit from 06/16/2024 in  [facility] [place] [facility] [place] Clinical Support from 06/05/2024 in [facility] Clinical Support from 04/22/2024 in [facility]  Total GAD-7 Score 17 18 12 6 15    Mini-Mental    Flowsheet Row Office Visit from 01/31/2017 in [facility] Office Visit from 11/27/2016 [facility] Office Visit from 06/28/2016 in [facility] Office Visit from 05/25/2016 in [facility] [place] [facility] [place] Office Visit from 03/11/2015 in [facility]  Total Score (max 30 points ) 25 11 18 15 26    PHQ2-9    Flowsheet Row Clinical Support from 09/11/2024 in [facility] Clinical Support from 07/10/2024 in [facility] Office Visit from 06/16/2024 in [facility] [place] [facility] . [facility] Clinical Support from 06/05/2024 in [facility] Clinical Support from 04/22/2024 in [facility]  PHQ-2 Total Score 6 6 2 2 4   PHQ-9 Total Score 22 18 7 9 13    Flowsheet Row Clinical Support from 09/11/2024 in [facility] Clinical Support from 07/10/2024 in [facility] Clinical Support from 06/05/2024 in [facility]  C-SSRS RISK CATEGORY Moderate Risk Moderate Risk Moderate Risk     Assessment and Plan:   Jody [name] is a 53 year old, African-American female with a past psychiatric history significant for schizoaffective disorder (depressed type), major depressive disorder, generalized anxiety disorder, sleep disturbances, and major neurocognitive disorder who presents to [facility] for follow up and medication management.   Patient presents to the encounter stating that she continues to take her medications regularly and denies experiencing any adverse side effects.  An aims assessment was performed with the patient scoring a 6.  She reports that her use of Austedo  XR continues to be helpful in minimizing her involuntary movements.  Provider to complete a prior authorization for the patient in order for patient to continue to receive her Austedo  XR through her insurance.  Patient continues to endorse some depression but states that her anxiety is fairly manageable.  A PHQ-9 screen was performed with the patient scoring a 22.  A GAD-7 screen was also performed the patient scoring a 17.  Patient denies changes in her behavior nor does she endorse paranoia.  Patient does not appear to be responding to internal/external stimuli.  Patient's labs are up-to-date.  Provider to schedule a future EKG appointment for the patient.  Patient to continue taking her medications as prescribed and denies the need for dosage adjustment at this time.  Patient's medications to be e-prescribed to pharmacy of choice.  A [place] Suicide Severity Rating Scale was performed with the patient being considered moderate risk.  Patient denies suicidal ideations and is able to contract for safety at this time.  Safety planning was discussed with the patient prior to the conclusion of the encounter.  - Patient was instructed to contact 911 in the event of a mental health crisis. - Patient was instructed to contact  [employer] Suicide and Crisis Lifeline in the event of a mental health crisis. - Patient was instructed to present to [facility] in the event of a mental health crisis.  Collaboration of Care: Collaboration of Care: Medication Management AEB provider managing patient's psychiatric medications, Primary Care Provider AEB patient being seen by a primary care provider at [facility],  Psychiatrist AEB patient being seen by mental health provider at this facility, and Referral or follow-up with counselor/therapist AEB patient being seen by a licensed clinical social worker at this facility  Patient/Guardian was advised Release of Information must be obtained prior to any record release in order to collaborate their care with an outside provider. Patient/Guardian was advised if they have not already done so to contact the registration department to sign all necessary forms in order for us  to release information regarding their care.   Consent: Patient/Guardian gives verbal consent for treatment and assignment of benefits for services provided during this visit. Patient/Guardian expressed understanding and agreed to proceed.   1. Sleep disturbances  - Suvorexant  (BELSOMRA ) 10 MG TABS; Take 1 tablet (10 mg total) by mouth at bedtime as needed.  Dispense: 30 tablet; Refill: 0  2. Schizoaffective disorder, depressive type (HCC)  - mirtazapine  (REMERON ) 15 MG tablet; Take 1 tablet (15 mg total) by mouth at bedtime.  Dispense: 30 tablet; Refill: 2 - FLUoxetine  (PROZAC ) 40 MG capsule; Take 1 capsule (40 mg total) by mouth daily.  Dispense: 90 capsule; Refill: 2 - QUEtiapine  (SEROQUEL ) 400 MG tablet; Take 1 tablet (400 mg total) by mouth at bedtime.  Dispense: 30 tablet; Refill: 2 - Deutetrabenazine  ER (AUSTEDO  XR) 30 MG TB24; Take 30 mg by mouth daily.  Dispense: 30 tablet; Refill: 2  3. GAD (generalized anxiety disorder)  - mirtazapine  (REMERON ) 15 MG tablet; Take 1 tablet (15 mg total) by mouth at bedtime.  Dispense: 30 tablet; Refill: 2 - FLUoxetine  (PROZAC ) 40 MG capsule; Take 1 capsule (40 mg total) by mouth daily.  Dispense: 90 capsule; Refill: 2  4. Major neurocognitive disorder (HCC)  - FLUoxetine  (PROZAC ) 40 MG capsule; Take 1 capsule (40 mg total) by mouth daily.  Dispense: 90 capsule; Refill: 2 - donepezil  (ARICEPT ) 5 MG tablet; Take 1 tablet (5 mg total) by mouth at  bedtime.  Dispense: 30 tablet; Refill: 2  5. Tardive dyskinesia  - Deutetrabenazine  ER (AUSTEDO  XR) 30 MG TB24; Take 30 mg by mouth daily.  Dispense: 30 tablet; Refill: 2  6. Long term current use of antipsychotic medication (Primary) Patient to be scheduled a future EKG appointment.  Patient to follow-up in 6 weeks Provider spent a total of 24 minutes with the patient/reviewing patient's chart  Reginia FORBES Bolster, PA 09/11/2024, 2:00 PM

## 2024-09-16 NOTE — Telephone Encounter (Signed)
 Patient called and was upset because her authorization for Austedo  has not been done yet. She mentioned that she spoke with Lytle about it when came in on 09/11/24. I mentioned that he needed 48-72 hours and that he had been out of the office over the weekend and Monday. She asked if he didn't pass it along to one of his assistance. I responded I was unsure. She mentioned that it shouldn't take very long to do an authorization. I tolkd the patient I would pass along the message. Please advise. Thank you.

## 2024-10-06 ENCOUNTER — Telehealth (HOSPITAL_COMMUNITY): Payer: Self-pay | Admitting: Physician Assistant

## 2024-10-06 NOTE — Telephone Encounter (Signed)
 Patient calling about authorization for Austedo . Would like a call back as soon as possible. Please advise. Thank you.

## 2024-10-07 ENCOUNTER — Other Ambulatory Visit (HOSPITAL_COMMUNITY): Payer: Self-pay | Admitting: Physician Assistant

## 2024-10-07 ENCOUNTER — Telehealth (HOSPITAL_COMMUNITY): Payer: Self-pay | Admitting: Physician Assistant

## 2024-10-07 DIAGNOSIS — G479 Sleep disorder, unspecified: Secondary | ICD-10-CM

## 2024-10-07 NOTE — Telephone Encounter (Signed)
 Patient is upset and is angry that her authorization from December is not done yet. Patient has called multiple times. Patient says that her insurance company is telling her that nothing has been done. Patient says that if she doesn't get a phone call by the end of the day today, she is coming to the office to wait for Eddie. Please advise. Thank you.

## 2024-10-08 NOTE — Telephone Encounter (Signed)
 Message acknowledged and reviewed.

## 2024-10-15 ENCOUNTER — Other Ambulatory Visit (HOSPITAL_COMMUNITY): Payer: Self-pay | Admitting: Physician Assistant

## 2024-10-15 DIAGNOSIS — G2401 Drug induced subacute dyskinesia: Secondary | ICD-10-CM

## 2024-10-15 MED ORDER — AUSTEDO 12 MG PO TABS: 12.0000 mg | ORAL_TABLET | Freq: Two times a day (BID) | ORAL | 0 refills | Status: AC

## 2024-10-15 NOTE — Progress Notes (Signed)
 Prior authorization request for Austedo  XR was completed for the patient but denied by insurance plan.  Patient was originally taking Austedo  XR 30 mg daily.  The following medications are covered by the patient's insurance: - Austedo  - Ingrezza  - Tetrabenazine  Patient has tried Ingrezza  in the past but failed the medication.  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  Provider to prescribe patient Austedo  12 mg 2 times daily with a meal (24 mg total) for the management of her tardive dyskinesia.  If medication proves ineffective in managing patient's tardive dyskinesia, provider will attempt to appeal decision.

## 2024-10-23 ENCOUNTER — Encounter (HOSPITAL_COMMUNITY): Payer: Self-pay | Admitting: Physician Assistant

## 2024-10-23 ENCOUNTER — Ambulatory Visit (HOSPITAL_COMMUNITY): Admitting: Physician Assistant

## 2024-10-23 VITALS — BP 110/78 | HR 94 | Temp 98.6°F | Ht 64.0 in | Wt 195.8 lb

## 2024-10-23 DIAGNOSIS — F0390 Unspecified dementia without behavioral disturbance: Secondary | ICD-10-CM | POA: Diagnosis not present

## 2024-10-23 DIAGNOSIS — G2401 Drug induced subacute dyskinesia: Secondary | ICD-10-CM

## 2024-10-23 DIAGNOSIS — G479 Sleep disorder, unspecified: Secondary | ICD-10-CM

## 2024-10-23 DIAGNOSIS — F411 Generalized anxiety disorder: Secondary | ICD-10-CM

## 2024-10-23 DIAGNOSIS — Z79899 Other long term (current) drug therapy: Secondary | ICD-10-CM

## 2024-10-23 DIAGNOSIS — F251 Schizoaffective disorder, depressive type: Secondary | ICD-10-CM

## 2024-10-23 MED ORDER — DONEPEZIL HCL 5 MG PO TABS: 5.0000 mg | ORAL_TABLET | Freq: Every day | ORAL | 2 refills | Status: AC

## 2024-10-23 MED ORDER — QUETIAPINE FUMARATE 400 MG PO TABS: 400.0000 mg | ORAL_TABLET | Freq: Every day | ORAL | 2 refills | Status: AC

## 2024-10-23 MED ORDER — FLUOXETINE HCL 40 MG PO CAPS: 40.0000 mg | ORAL_CAPSULE | Freq: Every day | ORAL | 2 refills | Status: AC

## 2024-10-23 MED ORDER — MIRTAZAPINE 15 MG PO TABS: 15.0000 mg | ORAL_TABLET | Freq: Every day | ORAL | 2 refills | Status: AC

## 2024-10-23 MED ORDER — BELSOMRA 10 MG PO TABS: 10.0000 mg | ORAL_TABLET | Freq: Every evening | ORAL | 0 refills | Status: AC | PRN

## 2024-10-23 NOTE — Progress Notes (Cosign Needed)
 BH MD/PA/NP OP Progress Note  10/23/2024 2:30 PM Jody [name]  MRN:  [id number]  Chief Complaint:  Chief Complaint  Patient presents with   Follow-up   Medication Refill   HPI:   Jody [name] is a 54 year old, African-American female with a past psychiatric history significant for schizoaffective disorder (depressed type), major depressive disorder, generalized anxiety disorder, sleep disturbances, and major neurocognitive disorder who presents to [facility] for follow up and medication management. Patient is currently being managed on the following psychiatric medications:  Seroquel  400 mg at bedtime Prozac  40 mg daily Belsomra  10 mg at bedtime Austedo  XR 30 mg daily Donepezil  5 mg daily Mirtazapine  15 mg at bedtime  Patient presents to the encounter stating that she has not been experiencing any adverse side effects associated with her medications.  Patient informed provider that she has roughly 3 weeks left of her Austedo  XR prescription.  Provider informed patient that her next prescription may be regular Austedo  12 mg 2 times daily due to patient's insurance not covering her prescription for Austedo  XR 30 mg daily.  Provider informed patient that she would need to fail Austedo  before an appeal can be made for her insurance to cover her Austedo  XR.  Patient vocalized understanding.  Patient endorses good mood and states that she has been reading her Bible more.  Patient rates her depression a 3 out of 10 with 10 being most severe.  Patient endorses depressive episodes 2 days/week.  Patient endorses the following depressive symptoms: lack of motivation, decreased concentration, decreased energy, irritability, and feelings of guilt/worthlessness.  Patient denies feelings of sadness, crying spells, or hopelessness.  Patient reports that her anxiety has been manageable and rates her anxiety a 2 out of 10.  Patient denies any stressors related  to her anxiety.  A PHQ-9 screen was performed with the patient scoring a 10.  A GAD-7 screen was also performed with the patient scoring a 13.  Patient denies any changes in her behavior or personality lately.  She reports that she does experience paranoia on occasion characterized by the feeling that people are talking about her.  She also endorses paranoia attributed to seeing blue and gray cars.  She reports that when she sees blue and gray-colored cars, she believes that they are sending a message to her.  Patient denies auditory or visual hallucinations.  Patient is alert and oriented x 4, calm, cooperative, and fully engaged in conversation during the encounter.  Patient endorses good mood.  Patient exhibits depressed mood with appropriate affect.  Patient denies suicidal or homicidal ideations.  She further denies auditory or visual hallucinations and does not appear to be responding to internal/external stimuli.  Patient endorses poor sleep and receives on average 4 hours of sleep per night.  Patient endorses good appetite and eats on average 3 meals per day.  Patient denies alcohol consumption or illicit drug use.  Patient endorses tobacco use and smokes on average 8 cigarettes/day.  Visit Diagnosis:    ICD-10-CM   1. Sleep disturbances  G47.9 Suvorexant  (BELSOMRA ) 10 MG TABS    2. Schizoaffective disorder, depressive type (HCC)  F25.1 mirtazapine  (REMERON ) 15 MG tablet    FLUoxetine  (PROZAC ) 40 MG capsule    QUEtiapine  (SEROQUEL ) 400 MG tablet    3. GAD (generalized anxiety disorder)  F41.1 mirtazapine  (REMERON ) 15 MG tablet    FLUoxetine  (PROZAC ) 40 MG capsule    4. Major neurocognitive disorder (HCC)  F03.90 FLUoxetine  (PROZAC )  40 MG capsule    donepezil  (ARICEPT ) 5 MG tablet    5. Hot flashes  R23.2       Past Psychiatric History:  Schizophrenia - diagnosed roughly 7 years ago when patient was attending [facility] Patient was diagnosed with schizoaffective disorder  (depressive type) when admitted to [facility] from 04/08/2022 - 04/12/2022.  In addition to being diagnosed with schizoaffective disorder, patient was treated for insomnia, generalized anxiety disorder, and tardive dyskinesia.   **Onset of dementia, major neurocognitive disorder, due to Alzheimer's disease, without behavioral disturbance, mild  Past Medical History:  Past Medical History:  Diagnosis Date   Anxiety and depression    Cystitis, interstitial    Elevated troponin    a. 09/2015: CP/elevated troponin up to 0.11 - unclear significance. CT neg for PE, LHC neg for CAD, normal echo.   Hypertension    Microcytic anemia    Noted on labs   Panic attack    Paranoid schizophrenia (HCC)    Tobacco abuse     Past Surgical History:  Procedure Laterality Date   CARDIAC CATHETERIZATION N/A 09/29/2015   Procedure: Left Heart Cath and Coronary Angiography;  Surgeon: Candyce GORMAN Reek, MD;  Location: [facility] INVASIVE CV LAB;  Service: Cardiovascular;  Laterality: N/A;   TUBAL LIGATION      Family Psychiatric History:  Grandmother (maternal) - unsure of the diagnosis but states that her grandmother had mental health issues Uncle (maternal) - Schizophrenia and bipolar disorder, he is currently taking medications but is unsure of the type.   Family history of suicide: Patient denies Family history of homicide: Patient denies Family history of substance abuse: Per patient's sister, alcohol abuse runs in the family  Family History:  Family History  Problem Relation Age of Onset   Colon polyps Mother    Hypertension Mother    Diabetes Father    Hypertension Sister    Lupus Sister    Heart disease Maternal Grandfather    Lupus Other    BRCA 1/2 Neg Hx    Breast cancer Neg Hx    Colon cancer Neg Hx    Rectal cancer Neg Hx    Stomach cancer Neg Hx    Esophageal cancer Neg Hx     Social History:  Social History   Socioeconomic History   Marital status: Single     Spouse name: Not on file   Number of children: 5   Years of education: 12   Highest education level: Not on file  Occupational History   Occupation: Disabled  Tobacco Use   Smoking status: Former    Current packs/day: 0.00    Average packs/day: 0.3 packs/day for 4.0 years (1.0 ttl pk-yrs)    Types: Cigarettes    Start date: 05/01/2012    Quit date: 05/01/2016    Years since quitting: 8.4   Smokeless tobacco: Never  Vaping Use   Vaping status: Never Used  Substance and Sexual Activity   Alcohol use: Not Currently    Alcohol/week: 0.0 standard drinks of alcohol    Comment: Quit 2017   Drug use: No   Sexual activity: Not Currently    Birth control/protection: Condom  Other Topics Concern   Not on file  Social History Narrative   Lives alone   Caffeine use: Soda- daily   Right-handed   Social Drivers of Health   Tobacco Use: Medium Risk (10/23/2024)   Patient History    Smoking Tobacco Use: Former  Smokeless Tobacco Use: Never    Passive Exposure: Not on file  Financial Resource Strain: Low Risk (12/25/2023)   Overall Financial Resource Strain (CARDIA)    Difficulty of Paying Living Expenses: Not hard at all  Food Insecurity: No Food Insecurity (12/25/2023)   Hunger Vital Sign    Worried About Running Out of Food in the Last Year: Never true    Ran Out of Food in the Last Year: Never true  Transportation Needs: No Transportation Needs (12/25/2023)   PRAPARE - Administrator, Civil Service (Medical): No    Lack of Transportation (Non-Medical): No  Physical Activity: Insufficiently Active (12/25/2023)   Exercise Vital Sign    Days of Exercise per Week: 1 day    Minutes of Exercise per Session: 20 min  Stress: No Stress Concern Present (12/25/2023)   [employer] of Occupational Health - Occupational Stress Questionnaire    Feeling of Stress : Not at all  Recent Concern: Stress - Stress Concern Present (11/09/2023)   [employer] of Occupational Health -  Occupational Stress Questionnaire    Feeling of Stress : To some extent  Social Connections: Moderately Isolated (12/25/2023)   Social Connection and Isolation Panel    Frequency of Communication with Friends and Family: Once a week    Frequency of Social Gatherings with Friends and Family: Once a week    Attends Religious Services: 1 to 4 times per year    Active Member of Clubs or Organizations: Yes    Attends Banker Meetings: 1 to 4 times per year    Marital Status: Divorced  Depression (PHQ2-9): Medium Risk (10/23/2024)   Depression (PHQ2-9)    PHQ-2 Score: 10  Alcohol Screen: Low Risk (12/25/2023)   Alcohol Screen    Last Alcohol Screening Score (AUDIT): 0  Housing: Low Risk (12/25/2023)   Housing Stability Vital Sign    Unable to Pay for Housing in the Last Year: No    Number of Times Moved in the Last Year: 0    Homeless in the Last Year: No  Utilities: Not At Risk (12/25/2023)   AHC Utilities    Threatened with loss of utilities: No  Health Literacy: Adequate Health Literacy (12/25/2023)   B1300 Health Literacy    Frequency of need for help with medical instructions: Never    Allergies:  Allergies  Allergen Reactions   Penicillins Itching and Rash    Metabolic Disorder Labs: Lab Results  Component Value Date   HGBA1C 5.8 (H) 06/16/2024   MPG 114.02 04/07/2022   MPG 117 09/28/2015   Lab Results  Component Value Date   PROLACTIN 9.7 12/01/2022   PROLACTIN 17.2 04/07/2022   Lab Results  Component Value Date   CHOL 152 11/09/2023   TRIG 120 11/09/2023   HDL 41 11/09/2023   CHOLHDL 3.7 11/09/2023   VLDL 56 (H) 04/09/2022   LDLCALC 89 11/09/2023   LDLCALC 72 05/04/2023   Lab Results  Component Value Date   TSH 0.986 06/16/2024   TSH 5.855 (H) 04/07/2022    Therapeutic Level Labs: No results found for: LITHIUM Lab Results  Component Value Date   VALPROATE 118 (H) 04/26/2016   VALPROATE 136 (H) 04/26/2016   No results found for:  CBMZ  Current Medications: Current Outpatient Medications  Medication Sig Dispense Refill   atorvastatin  (LIPITOR) 10 MG tablet Take 1 tablet (10 mg total) by mouth daily. 28 tablet 3   cloNIDine  (CATAPRES ) 0.1 MG tablet Take  1 tablet (0.1 mg total) by mouth at bedtime. 30 tablet 2   Deutetrabenazine  (AUSTEDO ) 12 MG TABS Take 1 tablet (12 mg total) by mouth 2 (two) times daily with a meal. 60 tablet 0   Deutetrabenazine  ER (AUSTEDO  XR) 30 MG TB24 Take 30 mg by mouth daily. 30 tablet 2   donepezil  (ARICEPT ) 5 MG tablet Take 1 tablet (5 mg total) by mouth at bedtime. 30 tablet 2   FEROSUL 325 (65 Fe) MG tablet TAKE 1 TABLET (325 MG TOTAL) BY MOUTH DAILY WITH BREAKFAST. 28 tablet 3   FLUoxetine  (PROZAC ) 40 MG capsule Take 1 capsule (40 mg total) by mouth daily. 90 capsule 2   gabapentin  (NEURONTIN ) 100 MG capsule TAKE 1 CAPSULE BY MOUTH TWICE A DAY (MORNING AND EVENING) 14 capsule 0   hydrochlorothiazide  (HYDRODIURIL ) 25 MG tablet TAKE 1 TABLET BY MOUTH DAILY 28 tablet 3   mirtazapine  (REMERON ) 15 MG tablet Take 1 tablet (15 mg total) by mouth at bedtime. 30 tablet 2   nystatin  (MYCOSTATIN ) 100000 UNIT/ML suspension SWISH AND SPIT 5 mLs (500,000 Units total) by mouth 4 (four) times daily. 60 mL 0   QUEtiapine  (SEROQUEL ) 400 MG tablet Take 1 tablet (400 mg total) by mouth at bedtime. 30 tablet 2   Suvorexant  (BELSOMRA ) 10 MG TABS Take 1 tablet (10 mg total) by mouth at bedtime as needed. 30 tablet 0   Suvorexant  (BELSOMRA ) 10 MG TABS Take 1 tablet (10 mg total) by mouth at bedtime as needed. 30 tablet 0   tirzepatide  (ZEPBOUND ) 2.5 MG/0.5ML injection vial Inject 2.5 mg into the skin once a week. (Patient not taking: Reported on 06/16/2024) 2 mL 0   trimethoprim  (TRIMPEX ) 100 MG tablet Take 100 mg by mouth daily.     Current Facility-Administered Medications  Medication Dose Route Frequency Provider Last Rate Last Admin   0.9 %  sodium chloride  infusion  500 mL Intravenous Once Pyrtle, Gordy HERO, MD          Musculoskeletal: Strength & Muscle Tone: within normal limits Gait & Station: normal Patient leans: N/A  Psychiatric Specialty Exam: Review of Systems  Psychiatric/Behavioral:  Positive for dysphoric mood and sleep disturbance. Negative for decreased concentration, hallucinations, self-injury and suicidal ideas. The patient is nervous/anxious. The patient is not hyperactive.     Blood pressure 110/78, pulse 94, temperature 98.6 F (37 C), temperature source Oral, height 5' 4 (1.626 m), weight 195 lb 12.8 oz (88.8 kg), SpO2 100%.Body mass index is 33.61 kg/m.  General Appearance: Casual  Eye Contact:  Good  Speech:  Clear and Coherent and Normal Rate  Volume:  Normal  Mood:  Anxious and Depressed  Affect:  Appropriate  Thought Process:  Coherent, Goal Directed, and Descriptions of Associations: Intact  Orientation:  Full (Time, Place, and Person)  Thought Content: WDL and Paranoid Ideation   Suicidal Thoughts:  No  Homicidal Thoughts:  No  Memory:  Immediate;   Good Recent;   Fair Remote;   Fair  Judgement:  Good  Insight:  Good  Psychomotor Activity:  TD  Concentration:  Concentration: Good and Attention Span: Good  Recall:  Good  Fund of Knowledge: Fair  Language: Good  Akathisia:  No  Handed:  Right  AIMS (if indicated): done  Assets:  Communication Skills Desire for Improvement Housing Social Support  ADL's:  Intact  Cognition: Impaired,  Mild  Sleep:  Poor   Screenings: AIMS    Flowsheet Row Clinical Support from 10/23/2024 in [facility] Clinical Support from 09/11/2024 in [facility] Clinical Support from 07/10/2024 in [facility] Clinical Support from 06/05/2024 in [facility] Clinical Support from 04/22/2024 in [facility]  AIMS Total Score 6 6 8 7 3    AUDIT    Flowsheet Row [facility]) [facility]  Alcohol Use Disorder Identification Test Final Score (AUDIT) 9 4 20    GAD-7    Flowsheet Row Clinical Support from 10/23/2024 in [facility] Clinical Support from 09/11/2024 in [facility] Clinical Support from 07/10/2024 in [facility] Office Visit from 06/16/2024 in [facility] [place] [facility] [place]. [facility] Clinical Support from 06[facility]05/2024 in [facility]  Total GAD-7 Score 13 17 18 12 6    Mini-Mental    Flowsheet Row [facility] from [facility] Office Visit from 11/27/2016 in [facility] Office Visit from 06/28/2016 in [facility] Office Visit from 05/25/2016 in [facility] [place] [facility] [place] Office Visit from 03/11/2015 in [facility]  Total Score (max 30 points ) 25 11 18 15 26    PHQ2-9    Flowsheet Row Clinical Support from 10/23/2024 in [facility] Clinical Support from 09/11/2024 in [facility] Clinical Support from 07/10/2024 in [facility] Office Visit from 06/16/2024 in [facility] [place] [facility] [place]. [facility] Clinical Support from 06/05/2024 in [facility]  PHQ-2 Total Score 2 6 6 2 2   PHQ-9 Total Score 10 22 18 7 9    Flowsheet Row Clinical Support from 10/23/2024 in [facility] Clinical Support from 09/11/2024 in [facility] Clinical Support from  07/10/2024 in [facility]  C-SSRS RISK CATEGORY Moderate Risk Moderate Risk Moderate Risk     Assessment and Plan:   Jody [name] is a 54 year old, African-American female with a past psychiatric history significant for schizoaffective disorder (depressed type), major depressive disorder, generalized anxiety disorder, sleep disturbances, and major neurocognitive disorder who presents to [facility] for follow up and medication management.  Patient presents to the encounter stating that she has been taking her medications regularly and denies experiencing any adverse side effects.  An aims assessment was performed with the patient scoring a 6.  Patient continues to take her Austedo  regularly.  Patient was informed that she has been prescribed a prescription for Austedo  12 mg 2 times daily due to her insurance not covering her Austedo  XR prescription.  If patient fails prescription, provider to make an appeal for patient's insurance to cover Austedo  XR.  Patient vocalized understanding.  Patient reports that she has been experiencing some depression but states that it has not been as severe as it has been in the past.  She also endorses anxiety but states that it has been manageable lately. A PHQ-9 screen was performed with the patient scoring a 10.  A GAD-7 screen was also performed with the patient scoring a 13.  Patient denies changes in her personality or behavior.  She states that she occasionally experiences paranoia characterized by the feeling that people are talking about her or seeing blue or gray color cards.  Patient denies auditory or visual hallucinations and does not appear to be responding to internal/external stimuli.  Patient endorses stability on her current medication regimen and would like to continue taking her medications as prescribed.  Patient's medications to be e-prescribed to pharmacy of  choice.  Provider to schedule a future EKG appointment for the patient.  Patient's labs are up-to-date.  A [place] Suicide Severity Rating Scale was performed with the patient being considered moderate risk.  Patient denies suicidal ideations and is able to contract for safety at this time.  Safety planning was discussed with the patient prior to the conclusion of the encounter.  - Patient was instructed to contact 911 in the event of a mental health crisis. - Patient was instructed to contact 988 Suicide and Crisis Lifeline in the event of a mental health crisis. - Patient was instructed to present to [facility] in the event of a mental health crisis.  Collaboration of Care: Collaboration of Care: Medication Management AEB provider managing patient's psychiatric medications, Primary Care Provider AEB patient being seen by a primary care provider at [facility], Psychiatrist AEB patient being seen by mental health provider at this facility, and Referral or follow-up with counselor/therapist AEB patient being seen by a licensed clinical social worker at this facility  Patient/Guardian was advised Release of Information must be obtained prior to any record release in order to collaborate their care with an outside provider. Patient/Guardian was advised if they have not already done so to contact the registration department to sign all necessary forms in order for us  to release information regarding their care.   Consent: Patient/Guardian gives verbal consent for treatment and assignment of benefits for services provided during this visit. Patient/Guardian expressed understanding and agreed to proceed.   1. Sleep disturbances  - Suvorexant  (BELSOMRA ) 10 MG TABS; Take 1 tablet (10 mg total) by mouth at bedtime as needed.  Dispense: 30 tablet; Refill: 0  2. Schizoaffective disorder, depressive type (HCC)  - mirtazapine  (REMERON ) 15 MG tablet;  Take 1 tablet (15 mg total) by mouth at bedtime.  Dispense: 30 tablet; Refill: 2 - FLUoxetine  (PROZAC ) 40 MG capsule; Take 1 capsule (40 mg total) by mouth daily.  Dispense: 90 capsule; Refill: 2 - QUEtiapine  (SEROQUEL ) 400 MG tablet; Take 1 tablet (400 mg total) by mouth at bedtime.  Dispense: 30 tablet; Refill: 2  3. GAD (generalized anxiety disorder)  - mirtazapine  (REMERON ) 15 MG tablet; Take 1 tablet (15 mg total) by mouth at bedtime.  Dispense: 30 tablet; Refill: 2 - FLUoxetine  (PROZAC ) 40 MG capsule; Take 1 capsule (40 mg total) by mouth daily.  Dispense: 90 capsule; Refill: 2  4. Major neurocognitive disorder (HCC)  - FLUoxetine  (PROZAC ) 40 MG capsule; Take 1 capsule (40 mg total) by mouth daily.  Dispense: 90 capsule; Refill: 2 - donepezil  (ARICEPT ) 5 MG tablet; Take 1 tablet (5 mg total) by mouth at bedtime.  Dispense: 30 tablet; Refill: 2  5. Long term current use of antipsychotic medication (Primary) Patient to be scheduled a future EKG appointment.  6. Tardive dyskinesia Patient to continue taking the remainder of her Austedo  XR 30 mg daily Patient has been prescribed Austedo  12 mg 2 times daily for the management of her tardive dyskinesia. If  patient fails the medication, provider to put in an appeal for patient to receive Austedo  XR  Patient to follow-up in 6 weeks Provider spent a total of 24 minutes with the patient/reviewing patient's chart  Reginia FORBES Bolster, PA 10/23/2024, 2:30 PM

## 2024-12-04 ENCOUNTER — Encounter (HOSPITAL_COMMUNITY): Admitting: Physician Assistant

## 2024-12-15 ENCOUNTER — Ambulatory Visit: Admitting: Nurse Practitioner

## 2024-12-30 ENCOUNTER — Ambulatory Visit
